# Patient Record
Sex: Female | Born: 1935
Health system: Southern US, Community
[De-identification: ages and names within clinical notes are randomized; demographics above are authoritative.]

## PROBLEM LIST (undated history)

## (undated) DIAGNOSIS — E079 Disorder of thyroid, unspecified: Secondary | ICD-10-CM

## (undated) DIAGNOSIS — I10 Essential (primary) hypertension: Secondary | ICD-10-CM

## (undated) DIAGNOSIS — Z8669 Personal history of other diseases of the nervous system and sense organs: Secondary | ICD-10-CM

## (undated) DIAGNOSIS — M353 Polymyalgia rheumatica: Secondary | ICD-10-CM

## (undated) DIAGNOSIS — I6529 Occlusion and stenosis of unspecified carotid artery: Secondary | ICD-10-CM

## (undated) DIAGNOSIS — J449 Chronic obstructive pulmonary disease, unspecified: Secondary | ICD-10-CM

## (undated) DIAGNOSIS — F0280 Dementia in other diseases classified elsewhere without behavioral disturbance: Secondary | ICD-10-CM

## (undated) DIAGNOSIS — I5189 Other ill-defined heart diseases: Secondary | ICD-10-CM

## (undated) DIAGNOSIS — G459 Transient cerebral ischemic attack, unspecified: Secondary | ICD-10-CM

## (undated) DIAGNOSIS — N159 Renal tubulo-interstitial disease, unspecified: Secondary | ICD-10-CM

## (undated) DIAGNOSIS — S4291XA Fracture of right shoulder girdle, part unspecified, initial encounter for closed fracture: Secondary | ICD-10-CM

## (undated) DIAGNOSIS — F419 Anxiety disorder, unspecified: Secondary | ICD-10-CM

## (undated) DIAGNOSIS — I2510 Atherosclerotic heart disease of native coronary artery without angina pectoris: Secondary | ICD-10-CM

## (undated) DIAGNOSIS — E785 Hyperlipidemia, unspecified: Secondary | ICD-10-CM

## (undated) DIAGNOSIS — F32A Depression, unspecified: Secondary | ICD-10-CM

## (undated) DIAGNOSIS — F329 Major depressive disorder, single episode, unspecified: Secondary | ICD-10-CM

## (undated) DIAGNOSIS — G309 Alzheimer's disease, unspecified: Secondary | ICD-10-CM

## (undated) DIAGNOSIS — Z8639 Personal history of other endocrine, nutritional and metabolic disease: Secondary | ICD-10-CM

## (undated) HISTORY — PX: CARDIAC CATHETERIZATION: SHX172

## (undated) HISTORY — DX: Anxiety disorder, unspecified: F41.9

## (undated) HISTORY — DX: Major depressive disorder, single episode, unspecified: F32.9

## (undated) HISTORY — DX: Chronic obstructive pulmonary disease, unspecified: J44.9

## (undated) HISTORY — DX: Essential (primary) hypertension: I10

## (undated) HISTORY — DX: Depression, unspecified: F32.A

## (undated) HISTORY — DX: Renal tubulo-interstitial disease, unspecified: N15.9

## (undated) HISTORY — DX: Transient cerebral ischemic attack, unspecified: G45.9

## (undated) HISTORY — DX: Hyperlipidemia, unspecified: E78.5

## (undated) HISTORY — DX: Occlusion and stenosis of unspecified carotid artery: I65.29

## (undated) HISTORY — PX: ILIAC ARTERY STENT: SHX1786

## (undated) HISTORY — DX: Polymyalgia rheumatica: M35.3

## (undated) HISTORY — PX: LUNG BIOPSY: SHX232

## (undated) HISTORY — DX: Atherosclerotic heart disease of native coronary artery without angina pectoris: I25.10

## (undated) HISTORY — PX: CAROTID ENDARTERECTOMY: SUR193

## (undated) HISTORY — DX: Other ill-defined heart diseases: I51.89

## (undated) HISTORY — DX: Disorder of thyroid, unspecified: E07.9

## (undated) HISTORY — DX: Personal history of other diseases of the nervous system and sense organs: Z86.69

## (undated) HISTORY — DX: Personal history of other endocrine, nutritional and metabolic disease: Z86.39

---

## 2005-06-22 ENCOUNTER — Ambulatory Visit: Payer: Self-pay | Admitting: Family Medicine

## 2006-05-27 ENCOUNTER — Other Ambulatory Visit: Payer: Self-pay

## 2006-05-27 ENCOUNTER — Ambulatory Visit: Payer: Self-pay | Admitting: Ophthalmology

## 2006-06-08 ENCOUNTER — Ambulatory Visit: Payer: Self-pay | Admitting: Ophthalmology

## 2006-08-04 ENCOUNTER — Ambulatory Visit: Payer: Self-pay | Admitting: Family Medicine

## 2007-01-02 ENCOUNTER — Other Ambulatory Visit: Payer: Self-pay

## 2007-01-02 ENCOUNTER — Emergency Department: Payer: Self-pay | Admitting: Emergency Medicine

## 2007-03-04 ENCOUNTER — Inpatient Hospital Stay: Payer: Self-pay | Admitting: Internal Medicine

## 2007-03-04 ENCOUNTER — Other Ambulatory Visit: Payer: Self-pay

## 2007-03-21 ENCOUNTER — Ambulatory Visit: Payer: Self-pay | Admitting: Family Medicine

## 2007-09-20 ENCOUNTER — Ambulatory Visit: Payer: Self-pay | Admitting: Family Medicine

## 2007-11-03 ENCOUNTER — Ambulatory Visit: Payer: Self-pay | Admitting: Family Medicine

## 2008-06-03 ENCOUNTER — Emergency Department: Payer: Self-pay | Admitting: Emergency Medicine

## 2008-12-02 ENCOUNTER — Ambulatory Visit: Payer: Self-pay | Admitting: Family Medicine

## 2008-12-05 ENCOUNTER — Ambulatory Visit: Payer: Self-pay | Admitting: Family Medicine

## 2009-12-09 ENCOUNTER — Ambulatory Visit: Payer: Self-pay | Admitting: Family Medicine

## 2009-12-24 ENCOUNTER — Inpatient Hospital Stay: Payer: Self-pay | Admitting: Internal Medicine

## 2010-01-06 LAB — LIPID PANEL
Cholesterol: 287 mg/dL — AB (ref 0–200)
HDL: 39 mg/dL (ref 35–70)
LDL Cholesterol: 190 mg/dL
Triglycerides: 289 mg/dL — AB (ref 40–160)

## 2010-05-07 ENCOUNTER — Observation Stay: Payer: Self-pay | Admitting: Specialist

## 2010-05-10 ENCOUNTER — Emergency Department: Payer: Self-pay | Admitting: Emergency Medicine

## 2010-05-14 DIAGNOSIS — I2510 Atherosclerotic heart disease of native coronary artery without angina pectoris: Secondary | ICD-10-CM

## 2010-05-14 DIAGNOSIS — G589 Mononeuropathy, unspecified: Secondary | ICD-10-CM

## 2010-05-14 DIAGNOSIS — I5032 Chronic diastolic (congestive) heart failure: Secondary | ICD-10-CM

## 2010-05-14 DIAGNOSIS — I4891 Unspecified atrial fibrillation: Secondary | ICD-10-CM

## 2010-05-18 DIAGNOSIS — E039 Hypothyroidism, unspecified: Secondary | ICD-10-CM

## 2010-05-27 DIAGNOSIS — R079 Chest pain, unspecified: Secondary | ICD-10-CM

## 2010-06-15 DIAGNOSIS — I4891 Unspecified atrial fibrillation: Secondary | ICD-10-CM

## 2010-06-15 DIAGNOSIS — I5032 Chronic diastolic (congestive) heart failure: Secondary | ICD-10-CM

## 2010-06-15 DIAGNOSIS — I2510 Atherosclerotic heart disease of native coronary artery without angina pectoris: Secondary | ICD-10-CM

## 2010-07-18 DIAGNOSIS — H4010X0 Unspecified open-angle glaucoma, stage unspecified: Secondary | ICD-10-CM | POA: Insufficient documentation

## 2010-07-18 DIAGNOSIS — D891 Cryoglobulinemia: Secondary | ICD-10-CM | POA: Insufficient documentation

## 2010-07-18 DIAGNOSIS — N281 Cyst of kidney, acquired: Secondary | ICD-10-CM | POA: Insufficient documentation

## 2010-07-18 DIAGNOSIS — N3941 Urge incontinence: Secondary | ICD-10-CM | POA: Insufficient documentation

## 2010-07-18 DIAGNOSIS — I2589 Other forms of chronic ischemic heart disease: Secondary | ICD-10-CM | POA: Insufficient documentation

## 2010-07-18 DIAGNOSIS — G250 Essential tremor: Secondary | ICD-10-CM | POA: Insufficient documentation

## 2010-07-18 DIAGNOSIS — M810 Age-related osteoporosis without current pathological fracture: Secondary | ICD-10-CM | POA: Insufficient documentation

## 2010-09-16 ENCOUNTER — Ambulatory Visit: Payer: Self-pay | Admitting: Internal Medicine

## 2010-10-22 ENCOUNTER — Encounter: Payer: Self-pay | Admitting: Family Medicine

## 2010-10-22 ENCOUNTER — Ambulatory Visit (INDEPENDENT_AMBULATORY_CARE_PROVIDER_SITE_OTHER): Payer: Medicare Other | Admitting: Family Medicine

## 2010-10-22 VITALS — BP 148/56 | HR 80 | Temp 98.5°F | Resp 20 | Ht 63.25 in | Wt 197.5 lb

## 2010-10-22 DIAGNOSIS — B370 Candidal stomatitis: Secondary | ICD-10-CM

## 2010-10-22 DIAGNOSIS — J40 Bronchitis, not specified as acute or chronic: Secondary | ICD-10-CM

## 2010-10-22 MED ORDER — FLUCONAZOLE 150 MG PO TABS: 150.0000 mg | ORAL_TABLET | Freq: Once | ORAL | Status: DC

## 2010-10-22 MED ORDER — NYSTATIN 100000 UNIT/ML MT SUSP: 500000.0000 [IU] | Freq: Four times a day (QID) | OROMUCOSAL | Status: AC

## 2010-10-22 MED ORDER — AZITHROMYCIN 250 MG PO TABS: ORAL_TABLET | ORAL | Status: DC

## 2010-10-22 NOTE — Progress Notes (Signed)
77 yo new to our practice here for acute visit only to discuss sinus issues that could not wait until new pt appointment available.  URI symptoms- started almost 2 weeks ago with runny nose, cough, sneezing. Now have chills and subjective fever. Former smoker, h/o COPD. Feels like it is in her chest now--cough is productive.  Tongue is sore. Hurts to swallow.    ROS: See HPI No CP No SOB   Physical exam: BP 148/56  Pulse 80  Temp(Src) 98.5 F (36.9 C) (Oral)  Resp 20  Ht 5' 3.25" (1.607 m)  Wt 197 lb 8 oz (89.585 kg)  BMI 34.71 kg/m2  SpO2 93%  General:  Well-developed,well-nourished,in no acute distress; alert,appropriate and cooperative throughout examination Head:  normocephalic and atraumatic.   Eyes:  vision grossly intact, pupils equal, pupils round, and pupils reactive to light.   Ears:  R ear normal and L ear normal.   Nose:  no external deformity pos erythema  Mouth:  good dentition,  pos PND, pos white plaques on tongue and buccal mucosa Lungs:  Normal respiratory effort, chest expands symmetrically. Scattered exp wheeze, left >right Heart:  Normal rate and regular rhythm. S1 and S2 normal without gallop, murmur, click, rub or other extra sounds. Skin:  Intact without suspicious lesions or rashes Psych:  Cognition and judgment appear intact. Alert and cooperative with normal attention span and concentration. No apparent delusions, illusions, hallucinations  Assessment and Plan:  1. Bronchitis   New. Given duration and progression of symptoms, will treat for bacterial process. The patient indicates understanding of these issues and agrees with the plan.   2. Thrush    New.  Will treat with Nystatin susp.

## 2010-10-22 NOTE — Patient Instructions (Signed)
Good to see you. Please make an appointment for new patient-establish care visit. Keep me posted with your symptoms.

## 2010-11-02 ENCOUNTER — Ambulatory Visit (INDEPENDENT_AMBULATORY_CARE_PROVIDER_SITE_OTHER): Payer: Medicare Other | Admitting: Family Medicine

## 2010-11-02 ENCOUNTER — Encounter: Payer: Self-pay | Admitting: Family Medicine

## 2010-11-02 VITALS — BP 130/60 | HR 76 | Temp 98.4°F | Ht 63.5 in | Wt 195.0 lb

## 2010-11-02 DIAGNOSIS — E8841 MELAS syndrome: Secondary | ICD-10-CM

## 2010-11-02 DIAGNOSIS — J449 Chronic obstructive pulmonary disease, unspecified: Secondary | ICD-10-CM

## 2010-11-02 DIAGNOSIS — I10 Essential (primary) hypertension: Secondary | ICD-10-CM | POA: Insufficient documentation

## 2010-11-02 DIAGNOSIS — F3289 Other specified depressive episodes: Secondary | ICD-10-CM

## 2010-11-02 DIAGNOSIS — J4489 Other specified chronic obstructive pulmonary disease: Secondary | ICD-10-CM

## 2010-11-02 DIAGNOSIS — F329 Major depressive disorder, single episode, unspecified: Secondary | ICD-10-CM

## 2010-11-02 DIAGNOSIS — E079 Disorder of thyroid, unspecified: Secondary | ICD-10-CM

## 2010-11-02 DIAGNOSIS — E8840 Mitochondrial metabolism disorder, unspecified: Secondary | ICD-10-CM

## 2010-11-02 NOTE — Progress Notes (Signed)
77 yo here to establish care.  URI symptoms resolving.  CAD- s/p LAD stent placement in 10/04. On ASA and Plavix. Symptoms have been stable. Was admitted to [facility] in May, cardiac w/u was neg.  MELAS- diagnosed in February 03, 2010 after her son died of MELAS. Unclear which if any of her current issues are attributable to MELAS. She does have significant peripheral neuropathy, but symptoms have been stable on Gabapentin 800 mg three times daily.  Anxiety- has been a little worse lately since her husband has been having worsening health issues, possibly NPH. Feels like Zoloft has been helping. No SI or HI.  Patient Active Problem List  Diagnoses  . Bronchitis  . Thrush  . MELAS (mitochondrial encephalopathy, lactic acidosis and stroke-like episodes)   Past Medical History  Diagnosis Date  . HTN (hypertension)   . COPD (chronic obstructive pulmonary disease)   . Depression   . Thyroid disease   . HLD (hyperlipidemia)   . CAD (coronary artery disease)     h/o NSTEMI, LAD stent placement 10/04, cath 05/11/2010  . PMR (polymyalgia rheumatica)     with h/o steroid induced myopathy  . Anxiety   . Diastolic dysfunction     Echo 05/11/2010, EF 65-70%  . TIA (transient ischemic attack)     11/05 s/p L CEA   Past Surgical History  Procedure Date  . Cholecystectomy   . Carotid endarterectomy     left with stent placement 11/2003  . Iliac artery stent     right, 2001   History  Substance Use Topics  . Smoking status: Former Smoker    Quit date: 01/05/1996  . Smokeless tobacco: Not on file  . Alcohol Use: Not on file   No family history on file. Allergies  Allergen Reactions  . Codeine Other (See Comments)    Coma for 2 days  . Prednisone Other (See Comments)    Weight gain,puffy face   Current Outpatient Prescriptions on File Prior to Visit  Medication Sig Dispense Refill  . aspirin EC 81 MG tablet Take 81 mg by mouth daily.        Marland Kitchen atorvastatin (LIPITOR) 40 MG tablet  Take 40 mg by mouth daily.        . clopidogrel (PLAVIX) 75 MG tablet Take 75 mg by mouth daily.        Marland Kitchen gabapentin (NEURONTIN) 800 MG tablet 1 capsule by mouth in AM and 2 capsules by mouth in PM.       . Levothyroxine Sodium 25 MCG CAPS Take 1 capsule by mouth daily.        Marland Kitchen lidocaine (LINDAMANTLE) 3 % CREA cream Apply 1 application topically as needed.        Marland Kitchen lisinopril (PRINIVIL,ZESTRIL) 10 MG tablet Take 20 mg by mouth daily.        . metoprolol tartrate (LOPRESSOR) 25 MG tablet Take 12.5 mg by mouth daily.        . nitroGLYCERIN (NITROSTAT) 0.4 MG SL tablet Place 0.4 mg under the tongue every 5 (five) minutes as needed.        . ranitidine (ZANTAC) 150 MG tablet Take 150 mg by mouth 2 (two) times daily.        . sertraline (ZOLOFT) 50 MG tablet Take 50 mg by mouth daily.        Marland Kitchen tiotropium (SPIRIVA HANDIHALER) 18 MCG inhalation capsule Place 18 mcg into inhaler and inhale daily.        Marland Kitchen  nystatin (MYCOSTATIN) 100000 UNIT/ML suspension Take 5 mLs (500,000 Units total) by mouth 4 (four) times daily.  60 mL  0   The PMH, PSH, Social History, Family History, Medications, and allergies have been reviewed in [facility], and have been updated if relevant.     ROS: See HPI  Patient reports no  vision/ hearing changes,anorexia, weight change, fever ,adenopathy, persistant / recurrent hoarseness, swallowing issues, chest pain, edema,persistant / recurrent cough, hemoptysis, dyspnea(rest, exertional, paroxysmal nocturnal), gastrointestinal  bleeding (melena, rectal bleeding), abdominal pain, excessive heart burn, GU symptoms(dysuria, hematuria, pyuria, voiding/incontinence  Issues) syncope, focal weakness, severe memory loss, concerning skin lesions, depression,  abnormal bruising/bleeding, major joint swelling, breast masses or abnormal vaginal bleeding.     Physical exam: BP 130/60  Pulse 76  Temp(Src) 98.4 F (36.9 C) (Oral)  Ht 5' 3.5" (1.613 m)  Wt 195 lb (88.451 kg)  BMI 34.00  kg/m2  General:  Well-developed,well-nourished,in no acute distress; alert,appropriate and cooperative throughout examination Head:  normocephalic and atraumatic.   Eyes:  vision grossly intact, pupils equal, pupils round, and pupils reactive to light.   Ears:  R ear normal and L ear normal.   Nose:  no external deformity Mouth:  good dentition,  Lungs:  Normal respiratory effort, chest expands symmetrically. Heart:  Normal rate and regular rhythm. S1 and S2 normal without gallop, murmur, click, rub or other extra sounds. Skin:  Intact without suspicious lesions or rashes Psych:  Cognition and judgment appear intact. Alert and cooperative with normal attention span and concentration. No apparent delusions, illusions, hallucinations  Assessment and Plan:  1. MELAS (mitochondrial encephalopathy, lactic acidosis and stroke-like episodes)  Awaiting records.  Recent diagnosis.  2. HTN (hypertension)  Stable.  3. COPD (chronic obstructive pulmonary disease)  Stable.  4. Depression  Appropriately deteriorated. Pt feels she has good support at home, does not want to adjust medications.  5. Thyroid disease  Stable, awaiting records. Continue Synthroid 25 mcg daily.

## 2010-11-06 ENCOUNTER — Other Ambulatory Visit: Payer: Self-pay | Admitting: *Deleted

## 2010-11-06 MED ORDER — RANITIDINE HCL 150 MG PO TABS: 150.0000 mg | ORAL_TABLET | Freq: Two times a day (BID) | ORAL | Status: DC

## 2010-11-06 NOTE — Telephone Encounter (Signed)
Pt needs rx for mail order, was previously prescribed by old Dr.

## 2010-11-09 NOTE — Telephone Encounter (Signed)
Patient advised as instructed via telephone. 

## 2010-11-20 ENCOUNTER — Encounter: Payer: Self-pay | Admitting: Family Medicine

## 2010-11-24 ENCOUNTER — Other Ambulatory Visit: Payer: Self-pay | Admitting: Family Medicine

## 2010-11-24 ENCOUNTER — Ambulatory Visit (INDEPENDENT_AMBULATORY_CARE_PROVIDER_SITE_OTHER): Payer: Medicare Other | Admitting: Family Medicine

## 2010-11-24 ENCOUNTER — Encounter: Payer: Self-pay | Admitting: Family Medicine

## 2010-11-24 VITALS — BP 140/70 | HR 71 | Temp 98.0°F | Ht 63.5 in | Wt 198.5 lb

## 2010-11-24 DIAGNOSIS — R05 Cough: Secondary | ICD-10-CM

## 2010-11-24 DIAGNOSIS — R059 Cough, unspecified: Secondary | ICD-10-CM

## 2010-11-24 MED ORDER — BENZONATATE 100 MG PO CAPS
100.0000 mg | ORAL_CAPSULE | Freq: Four times a day (QID) | ORAL | Status: DC | PRN
Start: 2010-11-24 — End: 2010-11-24

## 2010-11-24 NOTE — Progress Notes (Signed)
77 yo here for follow up bronchitis.  Saw her last month for 2 weeks of URI symptoms- with chills and fever. Former smoker, h/o COPD.  Treated with Zpack and overall symptoms have improved but still has a nagging dry cough. No SOB or CP.    Patient Active Problem List  Diagnoses  . Bronchitis  . Thrush  . MELAS (mitochondrial encephalopathy, lactic acidosis and stroke-like episodes)  . HTN (hypertension)  . COPD (chronic obstructive pulmonary disease)  . Depression  . Thyroid disease  . Cough   Past Medical History  Diagnosis Date  . HTN (hypertension)   . COPD (chronic obstructive pulmonary disease)   . Depression   . Thyroid disease   . HLD (hyperlipidemia)   . CAD (coronary artery disease)     h/o NSTEMI, LAD stent placement 10/04, cath 05/11/2010  . PMR (polymyalgia rheumatica)     with h/o steroid induced myopathy  . Anxiety   . Diastolic dysfunction     Echo 05/11/2010, EF 65-70%  . TIA (transient ischemic attack)     11/05 s/p L CEA   Past Surgical History  Procedure Date  . Cholecystectomy   . Carotid endarterectomy     left with stent placement 11/2003  . Iliac artery stent     right, 2001   History  Substance Use Topics  . Smoking status: Former Smoker    Quit date: 01/05/1996  . Smokeless tobacco: Not on file  . Alcohol Use: Not on file   No family history on file. Allergies  Allergen Reactions  . Codeine Other (See Comments)    Coma for 2 days  . Prednisone Other (See Comments)    Weight gain,puffy face   Current Outpatient Prescriptions on File Prior to Visit  Medication Sig Dispense Refill  . aspirin EC 81 MG tablet Take 81 mg by mouth daily.        Marland Kitchen atorvastatin (LIPITOR) 40 MG tablet Take 40 mg by mouth daily.        . clopidogrel (PLAVIX) 75 MG tablet Take 75 mg by mouth daily.        Marland Kitchen gabapentin (NEURONTIN) 800 MG tablet 1 capsule by mouth in AM and 2 capsules by mouth in PM.       . Levothyroxine Sodium 25 MCG CAPS Take 1 capsule by  mouth daily.        Marland Kitchen lidocaine (LINDAMANTLE) 3 % CREA cream Apply 1 application topically as needed.        Marland Kitchen lisinopril (PRINIVIL,ZESTRIL) 10 MG tablet Take 20 mg by mouth daily.        . metoprolol tartrate (LOPRESSOR) 25 MG tablet Take 12.5 mg by mouth daily.        . nitroGLYCERIN (NITROSTAT) 0.4 MG SL tablet Place 0.4 mg under the tongue every 5 (five) minutes as needed.        . ranitidine (ZANTAC) 150 MG tablet Take 1 tablet (150 mg total) by mouth 2 (two) times daily.  180 tablet  3  . sertraline (ZOLOFT) 50 MG tablet Take 50 mg by mouth daily.        Marland Kitchen tiotropium (SPIRIVA HANDIHALER) 18 MCG inhalation capsule Place 18 mcg into inhaler and inhale daily.         The PMH, PSH, Social History, Family History, Medications, and allergies have been reviewed in [facility], and have been updated if relevant.     ROS: See HPI   Physical exam: BP 140/70  Pulse  71  Temp(Src) 98 F (36.7 C) (Oral)  Ht 5' 3.5" (1.613 m)  Wt 198 lb 8 oz (90.039 kg)  BMI 34.61 kg/m2  General:  Well-developed,well-nourished,in no acute distress; alert,appropriate and cooperative throughout examination Head:  normocephalic and atraumatic.   Eyes:  vision grossly intact, pupils equal, pupils round, and pupils reactive to light.   Ears:  R ear normal and L ear normal.   Nose:  no external deformity Mouth:  good dentition,  pos PND Lungs:  Normal respiratory effort, chest expands symmetrically. No wheezes or crackles. Heart:  Normal rate and regular rhythm. S1 and S2 normal without gallop, murmur, click, rub or other extra sounds. Skin:  Intact without suspicious lesions or rashes Psych:  Cognition and judgment appear intact. Alert and cooperative with normal attention span and concentration. No apparent delusions, illusions, hallucinations  Assessment and Plan:  1. Cough   Unchanged but other symptoms have improved. Lungs clear on exam.   Will treat with Tessalon Perles for symptoms. Stay  hydrated.

## 2010-11-30 ENCOUNTER — Telehealth: Payer: Self-pay | Admitting: *Deleted

## 2010-11-30 MED ORDER — SERTRALINE HCL 50 MG PO TABS: 50.0000 mg | ORAL_TABLET | Freq: Every day | ORAL | Status: DC

## 2010-11-30 MED ORDER — RANITIDINE HCL 300 MG PO TABS: 300.0000 mg | ORAL_TABLET | Freq: Every day | ORAL | Status: DC

## 2010-11-30 NOTE — Telephone Encounter (Signed)
Spoke with [name], patients daughter and she requested that both Rx's be sent to [facility].  Will send in both Rx's and call her if there are any questions.  Zantac changed in Epic from 150mg  twice daily to 300mg  once daily.

## 2010-11-30 NOTE — Telephone Encounter (Signed)
Pt's daughter is asking if she can have her zantac script changed from 150 mg's twice a day to one 300 mg daily because the 150 mg is otc, and her insurance wont cover it.   She wants a 90 day script sent to [facility].  Also, needs refill on sertraline.

## 2010-11-30 NOTE — Telephone Encounter (Signed)
Yes ok for both but does she want it sent to [facility] or [facility] which is in the computer?

## 2010-12-21 ENCOUNTER — Other Ambulatory Visit: Payer: Self-pay | Admitting: Family Medicine

## 2010-12-21 MED ORDER — CLOPIDOGREL BISULFATE 75 MG PO TABS: 75.0000 mg | ORAL_TABLET | Freq: Every day | ORAL | Status: DC

## 2011-01-14 ENCOUNTER — Other Ambulatory Visit: Payer: Self-pay | Admitting: Family Medicine

## 2011-01-14 ENCOUNTER — Encounter: Payer: Self-pay | Admitting: Family Medicine

## 2011-01-14 ENCOUNTER — Ambulatory Visit (INDEPENDENT_AMBULATORY_CARE_PROVIDER_SITE_OTHER): Payer: Medicare Other | Admitting: Family Medicine

## 2011-01-14 VITALS — BP 130/60 | HR 69 | Temp 98.1°F | Wt 198.5 lb

## 2011-01-14 DIAGNOSIS — Z23 Encounter for immunization: Secondary | ICD-10-CM | POA: Diagnosis not present

## 2011-01-14 DIAGNOSIS — F329 Major depressive disorder, single episode, unspecified: Secondary | ICD-10-CM

## 2011-01-14 DIAGNOSIS — G589 Mononeuropathy, unspecified: Secondary | ICD-10-CM | POA: Diagnosis not present

## 2011-01-14 DIAGNOSIS — G629 Polyneuropathy, unspecified: Secondary | ICD-10-CM | POA: Insufficient documentation

## 2011-01-14 DIAGNOSIS — R5381 Other malaise: Secondary | ICD-10-CM | POA: Diagnosis not present

## 2011-01-14 DIAGNOSIS — G4700 Insomnia, unspecified: Secondary | ICD-10-CM

## 2011-01-14 DIAGNOSIS — R0602 Shortness of breath: Secondary | ICD-10-CM | POA: Insufficient documentation

## 2011-01-14 DIAGNOSIS — R5383 Other fatigue: Secondary | ICD-10-CM | POA: Insufficient documentation

## 2011-01-14 LAB — BASIC METABOLIC PANEL
BUN: 19 mg/dL (ref 6–23)
Chloride: 106 mEq/L (ref 96–112)
Potassium: 5.1 mEq/L (ref 3.5–5.1)
Sodium: 143 mEq/L (ref 135–145)

## 2011-01-14 LAB — CBC WITH DIFFERENTIAL/PLATELET
Basophils Absolute: 0 10*3/uL (ref 0.0–0.1)
Eosinophils Relative: 3.4 % (ref 0.0–5.0)
Lymphocytes Relative: 13.4 % (ref 12.0–46.0)
Monocytes Relative: 9.5 % (ref 3.0–12.0)
Neutrophils Relative %: 73.4 % (ref 43.0–77.0)
Platelets: 222 10*3/uL (ref 150.0–400.0)
WBC: 8.8 10*3/uL (ref 4.5–10.5)

## 2011-01-14 LAB — MAGNESIUM: Magnesium: 2.2 mg/dL (ref 1.5–2.5)

## 2011-01-14 LAB — TSH: TSH: 2.92 u[IU]/mL (ref 0.35–5.50)

## 2011-01-14 MED ORDER — TRAZODONE HCL 50 MG PO TABS: 25.0000 mg | ORAL_TABLET | Freq: Every evening | ORAL | Status: DC | PRN

## 2011-01-14 NOTE — Patient Instructions (Signed)
Good to see you. We are checking blood work today. Let's start taking Trazadone at night.

## 2011-01-14 NOTE — Progress Notes (Signed)
78 yo here for SOB and fatigue.  Does have h/o COPD. Here with her daughter and husband who report that she often forgets to take her spiriva. Symptoms are better when she does take it. Does have h/o CAD- s/p LAD stent placement in 10/04. On ASA and Plavix.  Denies any CP. She has noted some dizziness when she stands from a seated position.  SOB is worsened with exertion.  MELAS- diagnosed in 2010/02/04 after her son died of MELAS. Unclear which if any of her current issues are attributable to MELAS. She does have significant peripheral neuropathy, but symptoms have been stable on Gabapentin 800 mg three times daily.  Insomnia- having difficulty falling asleep at night.  Daughter is concerned that it's partly related to increased anxiety from her husband's recent ailments. Denies any SI or HI. Does not feel tearful.  Patient Active Problem List  Diagnoses  . Bronchitis  . Thrush  . MELAS (mitochondrial encephalopathy, lactic acidosis and stroke-like episodes)  . HTN (hypertension)  . COPD (chronic obstructive pulmonary disease)  . Depression  . Thyroid disease  . Cough  . Shortness of breath  . Fatigue  . Neuropathy   Past Medical History  Diagnosis Date  . HTN (hypertension)   . COPD (chronic obstructive pulmonary disease)   . Depression   . Thyroid disease   . HLD (hyperlipidemia)   . CAD (coronary artery disease)     h/o NSTEMI, LAD stent placement 10/04, cath 05/11/2010  . PMR (polymyalgia rheumatica)     with h/o steroid induced myopathy  . Anxiety   . Diastolic dysfunction     Echo 05/11/2010, EF 65-70%  . TIA (transient ischemic attack)     11/05 s/p L CEA   Past Surgical History  Procedure Date  . Cholecystectomy   . Carotid endarterectomy     left with stent placement 11/2003  . Iliac artery stent     right, 2001   History  Substance Use Topics  . Smoking status: Former Smoker    Quit date: 01/05/1996  . Smokeless tobacco: Not on file  . Alcohol Use:  Not on file   No family history on file. Allergies  Allergen Reactions  . Codeine Other (See Comments)    Coma for 2 days  . Prednisone Other (See Comments)    Weight gain,puffy face   Current Outpatient Prescriptions on File Prior to Visit  Medication Sig Dispense Refill  . aspirin EC 81 MG tablet Take 81 mg by mouth daily.        Marland Kitchen atorvastatin (LIPITOR) 40 MG tablet Take 40 mg by mouth daily.        . clopidogrel (PLAVIX) 75 MG tablet Take 1 tablet (75 mg total) by mouth daily.  90 tablet  3  . gabapentin (NEURONTIN) 800 MG tablet 1 capsule by mouth in AM and 2 capsules by mouth in PM.       . Levothyroxine Sodium 25 MCG CAPS Take 1 capsule by mouth daily.        Marland Kitchen lidocaine (LINDAMANTLE) 3 % CREA cream Apply 1 application topically as needed.        Marland Kitchen lisinopril (PRINIVIL,ZESTRIL) 10 MG tablet Take 20 mg by mouth daily.        . metoprolol tartrate (LOPRESSOR) 25 MG tablet Take 12.5 mg by mouth daily.        . nitroGLYCERIN (NITROSTAT) 0.4 MG SL tablet Place 0.4 mg under the tongue every 5 (five) minutes as  needed.        . ranitidine (ZANTAC) 300 MG tablet Take 1 tablet (300 mg total) by mouth at bedtime.  90 tablet  3  . sertraline (ZOLOFT) 50 MG tablet Take 1 tablet (50 mg total) by mouth daily.  90 tablet  3  . tiotropium (SPIRIVA HANDIHALER) 18 MCG inhalation capsule Place 18 mcg into inhaler and inhale daily.         The PMH, PSH, Social History, Family History, Medications, and allergies have been reviewed in [facility], and have been updated if relevant.     ROS: See HPI  Patient reports no  vision/ hearing changes,anorexia, weight change, fever ,adenopathy, persistant / recurrent hoarseness, swallowing issues, chest pain, edema,persistant / recurrent cough, hemoptysis, dyspnea(rest, exertional, paroxysmal nocturnal), gastrointestinal  bleeding (melena, rectal bleeding), abdominal pain, excessive heart burn, GU symptoms(dysuria, hematuria, pyuria, voiding/incontinence  Issues)  syncope, focal weakness, severe memory loss, concerning skin lesions, depression,  abnormal bruising/bleeding, major joint swelling, breast masses or abnormal vaginal bleeding.     Physical exam: BP 130/60  Pulse 69  Temp(Src) 98.1 F (36.7 C) (Oral)  Wt 198 lb 8 oz (90.039 kg)  SpO2 94%  General:  Well-developed,well-nourished,in no acute distress; alert,appropriate and cooperative throughout examination Head:  normocephalic and atraumatic.   Eyes:  vision grossly intact, pupils equal, pupils round, and pupils reactive to light.   Ears:  R ear normal and L ear normal.   Nose:  no external deformity Mouth:  good dentition,  Lungs:  Normal respiratory effort, chest expands symmetrically. Heart:  Normal rate and regular rhythm. S1 and S2 normal without gallop, murmur, click, rub or other extra sounds. Skin:  Intact without suspicious lesions or rashes Psych:  Cognition and judgment appear intact. Alert and cooperative with normal attention span and concentration. No apparent delusions, illusions, hallucinations  Assessment and Plan:  1. Shortness of breath  New- likely multifactorial.  Stressed importance of taking her Spiriva.  Lungs clear today- ? Anemia.  Will check labs   2. Fatigue  Likely related to #1.  See above. TSH, CBC w/Diff, Basic Metabolic Panel (BMET), Magnesium  3. Insomnia  Deteriorated.  Will add Trazadone 50 mg qhs. The patient indicates understanding of these issues and agrees with the plan.

## 2011-01-18 ENCOUNTER — Ambulatory Visit (INDEPENDENT_AMBULATORY_CARE_PROVIDER_SITE_OTHER)
Admission: RE | Admit: 2011-01-18 | Discharge: 2011-01-18 | Disposition: A | Discharge status: Home / Self Care | Payer: Medicare Other | Source: Ambulatory Visit | Attending: Family Medicine | Admitting: Family Medicine

## 2011-01-18 ENCOUNTER — Other Ambulatory Visit: Payer: Self-pay | Admitting: Family Medicine

## 2011-01-18 DIAGNOSIS — R079 Chest pain, unspecified: Secondary | ICD-10-CM | POA: Diagnosis not present

## 2011-01-18 DIAGNOSIS — R0602 Shortness of breath: Secondary | ICD-10-CM

## 2011-01-18 MED ORDER — ATORVASTATIN CALCIUM 40 MG PO TABS: 40.0000 mg | ORAL_TABLET | Freq: Every day | ORAL | Status: DC

## 2011-01-18 MED ORDER — METOPROLOL TARTRATE 25 MG PO TABS: 12.5000 mg | ORAL_TABLET | Freq: Every day | ORAL | Status: DC

## 2011-01-18 MED ORDER — GABAPENTIN 800 MG PO TABS: ORAL_TABLET | ORAL | Status: DC

## 2011-01-19 ENCOUNTER — Other Ambulatory Visit: Payer: Self-pay | Admitting: Family Medicine

## 2011-01-19 DIAGNOSIS — J449 Chronic obstructive pulmonary disease, unspecified: Secondary | ICD-10-CM

## 2011-02-02 ENCOUNTER — Ambulatory Visit (INDEPENDENT_AMBULATORY_CARE_PROVIDER_SITE_OTHER): Payer: Medicare Other | Admitting: Family Medicine

## 2011-02-02 ENCOUNTER — Encounter: Payer: Self-pay | Admitting: Family Medicine

## 2011-02-02 VITALS — BP 100/60 | HR 72 | Temp 98.3°F | Wt 199.5 lb

## 2011-02-02 DIAGNOSIS — N644 Mastodynia: Secondary | ICD-10-CM | POA: Diagnosis not present

## 2011-02-02 DIAGNOSIS — R059 Cough, unspecified: Secondary | ICD-10-CM

## 2011-02-02 DIAGNOSIS — R05 Cough: Secondary | ICD-10-CM | POA: Diagnosis not present

## 2011-02-02 MED ORDER — TIOTROPIUM BROMIDE MONOHYDRATE 18 MCG IN CAPS: 18.0000 ug | ORAL_CAPSULE | Freq: Every day | RESPIRATORY_TRACT | Status: DC

## 2011-02-02 MED ORDER — MOXIFLOXACIN HCL 400 MG PO TABS: 400.0000 mg | ORAL_TABLET | Freq: Every day | ORAL | Status: AC

## 2011-02-02 NOTE — Progress Notes (Signed)
78 yo here with cough.  Former smoker, h/o COPD.  Recently treated with Zpack, cough has persisted. Husband has PNA and now her cough is more productive.  No SOB or CP.  Ran out of her spiriva.  Right breast pain- ongoing for a year.  Has not felt any lumps but breast is very tender to touch. No changes in nipple or nipple discharge.  Rollene Fare, Ms. Gunning's daughter, is concerned that both she and her husband [name] are needing more help at home. They have an aide (non RN) who comes to the home a couple of times a week. 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  Ms. Resko is not comfortable with moving to ALF yet.  Patient Active Problem List  Diagnoses  . Bronchitis  . Thrush  . MELAS (mitochondrial encephalopathy, lactic acidosis and stroke-like episodes)  . HTN (hypertension)  . COPD (chronic obstructive pulmonary disease)  . Depression  . Thyroid disease  . Cough  . Shortness of breath  . Fatigue  . Neuropathy  . Insomnia  . Breast pain   Past Medical History  Diagnosis Date  . HTN (hypertension)   . COPD (chronic obstructive pulmonary disease)   . Depression   . Thyroid disease   . HLD (hyperlipidemia)   . CAD (coronary artery disease)     h/o NSTEMI, LAD stent placement 10/04, cath 05/11/2010  . PMR (polymyalgia rheumatica)     with h/o steroid induced myopathy  . Anxiety   . Diastolic dysfunction     Echo 05/11/2010, EF 65-70%  . TIA (transient ischemic attack)     11/05 s/p L CEA   Past Surgical History  Procedure Date  . Cholecystectomy   . Carotid endarterectomy     left with stent placement 11/2003  . Iliac artery stent     right, 2001   History  Substance Use Topics  . Smoking status: Former Smoker    Quit date: 01/05/1996  . Smokeless tobacco: Never Used  . Alcohol Use: No   No family history on file. Allergies  Allergen Reactions  . Codeine Other (See Comments)    Coma for 2 days  .  Prednisone Other (See Comments)    Weight gain,puffy face   Current Outpatient Prescriptions on File Prior to Visit  Medication Sig Dispense Refill  . aspirin EC 81 MG tablet Take 81 mg by mouth daily.        . clopidogrel (PLAVIX) 75 MG tablet Take 1 tablet (75 mg total) by mouth daily.  90 tablet  3  . gabapentin (NEURONTIN) 800 MG tablet 1 capsule by mouth in AM and 2 capsules by mouth in PM. 1 capsule by mouth in AM and 2 capsules by mouth in PM.  180 tablet  3  . Levothyroxine Sodium 25 MCG CAPS Take 1 capsule by mouth daily.        Marland Kitchen lidocaine (LINDAMANTLE) 3 % CREA cream Apply 1 application topically as needed.        Marland Kitchen lisinopril (PRINIVIL,ZESTRIL) 10 MG tablet Take 20 mg by mouth daily.        . metoprolol tartrate (LOPRESSOR) 25 MG tablet Take 0.5 tablets (12.5 mg total) by mouth daily.  90 tablet  3  . nitroGLYCERIN (NITROSTAT) 0.4 MG SL tablet Place 0.4 mg under the tongue every 5 (five) minutes as needed.        . ranitidine (ZANTAC) 300 MG tablet Take  1 tablet (300 mg total) by mouth at bedtime.  90 tablet  3  . sertraline (ZOLOFT) 50 MG tablet Take 1 tablet (50 mg total) by mouth daily.  90 tablet  3  . traZODone (DESYREL) 50 MG tablet Take 0.5-1 tablets (25-50 mg total) by mouth at bedtime as needed for sleep.  30 tablet  3  . atorvastatin (LIPITOR) 40 MG tablet Take 1 tablet (40 mg total) by mouth daily.  90 tablet  3   The PMH, PSH, Social History, Family History, Medications, and allergies have been reviewed in [facility], and have been updated if relevant.     ROS: See HPI   Physical exam: BP 100/60  Pulse 72  Temp(Src) 98.3 F (36.8 C) (Oral)  Wt 199 lb 8 oz (90.493 kg)  General:  Well-developed,well-nourished,in no acute distress; alert,appropriate and cooperative throughout examination Head:  normocephalic and atraumatic.   Eyes:  vision grossly intact, pupils equal, pupils round, and pupils reactive to light.   Ears:  R ear normal and L ear normal.   Nose:  no  external deformity Mouth:  good dentition,  pos PND Breast:  No palpable masses or changes in nipples but she is very tender at 3 oclock position on left breast. Lungs:  Normal respiratory effort, chest expands symmetrically. Pos exp wheezes, ronchi on left Heart:  Normal rate and regular rhythm. S1 and S2 normal without gallop, murmur, click, rub or other extra sounds. Skin:  Intact without suspicious lesions or rashes Psych:  Cognition and judgment appear intact. Alert and cooperative with normal attention span and concentration. No apparent delusions, illusions, hallucinations  Assessment and Plan:  1. Breast pain in female  No palpable mass. Will refer for mammogram for further evaluation. MM Digital Diagnostic Bilat  2. Cough  Deteriorated with worsening lung sounds and husband with probable PNA. Will treat with Avelox, advised to restart spiriva. The patient indicates understanding of these issues and agrees with the plan.    3.  Increased home care needs Pt is resistant to idea of ALF at this time. I will contact to Leticia Penna to see what we can do to help with increased home assistance.

## 2011-02-02 NOTE — Patient Instructions (Signed)
Please take Avelox as directed. Use your spiriva.

## 2011-02-03 ENCOUNTER — Ambulatory Visit: Payer: Self-pay | Admitting: Family Medicine

## 2011-02-03 ENCOUNTER — Encounter: Payer: Self-pay | Admitting: Family Medicine

## 2011-02-03 DIAGNOSIS — R928 Other abnormal and inconclusive findings on diagnostic imaging of breast: Secondary | ICD-10-CM | POA: Diagnosis not present

## 2011-02-03 DIAGNOSIS — N6009 Solitary cyst of unspecified breast: Secondary | ICD-10-CM | POA: Diagnosis not present

## 2011-02-04 ENCOUNTER — Encounter: Payer: Self-pay | Admitting: *Deleted

## 2011-02-05 ENCOUNTER — Inpatient Hospital Stay: Payer: Self-pay | Admitting: Internal Medicine

## 2011-02-05 DIAGNOSIS — F329 Major depressive disorder, single episode, unspecified: Secondary | ICD-10-CM | POA: Diagnosis present

## 2011-02-05 DIAGNOSIS — Z7902 Long term (current) use of antithrombotics/antiplatelets: Secondary | ICD-10-CM | POA: Diagnosis not present

## 2011-02-05 DIAGNOSIS — J9620 Acute and chronic respiratory failure, unspecified whether with hypoxia or hypercapnia: Secondary | ICD-10-CM | POA: Diagnosis not present

## 2011-02-05 DIAGNOSIS — M6281 Muscle weakness (generalized): Secondary | ICD-10-CM | POA: Diagnosis not present

## 2011-02-05 DIAGNOSIS — F0150 Vascular dementia without behavioral disturbance: Secondary | ICD-10-CM | POA: Diagnosis not present

## 2011-02-05 DIAGNOSIS — R0602 Shortness of breath: Secondary | ICD-10-CM | POA: Diagnosis not present

## 2011-02-05 DIAGNOSIS — Z79899 Other long term (current) drug therapy: Secondary | ICD-10-CM | POA: Diagnosis not present

## 2011-02-05 DIAGNOSIS — E8840 Mitochondrial metabolism disorder, unspecified: Secondary | ICD-10-CM | POA: Diagnosis not present

## 2011-02-05 DIAGNOSIS — R4182 Altered mental status, unspecified: Secondary | ICD-10-CM | POA: Diagnosis not present

## 2011-02-05 DIAGNOSIS — R262 Difficulty in walking, not elsewhere classified: Secondary | ICD-10-CM | POA: Diagnosis not present

## 2011-02-05 DIAGNOSIS — Z8673 Personal history of transient ischemic attack (TIA), and cerebral infarction without residual deficits: Secondary | ICD-10-CM | POA: Diagnosis not present

## 2011-02-05 DIAGNOSIS — J189 Pneumonia, unspecified organism: Secondary | ICD-10-CM | POA: Diagnosis not present

## 2011-02-05 DIAGNOSIS — J9600 Acute respiratory failure, unspecified whether with hypoxia or hypercapnia: Secondary | ICD-10-CM | POA: Diagnosis not present

## 2011-02-05 DIAGNOSIS — R0902 Hypoxemia: Secondary | ICD-10-CM | POA: Diagnosis not present

## 2011-02-05 DIAGNOSIS — Z823 Family history of stroke: Secondary | ICD-10-CM | POA: Diagnosis not present

## 2011-02-05 DIAGNOSIS — R6889 Other general symptoms and signs: Secondary | ICD-10-CM | POA: Diagnosis not present

## 2011-02-05 DIAGNOSIS — Z882 Allergy status to sulfonamides status: Secondary | ICD-10-CM | POA: Diagnosis not present

## 2011-02-05 DIAGNOSIS — Z7982 Long term (current) use of aspirin: Secondary | ICD-10-CM | POA: Diagnosis not present

## 2011-02-05 DIAGNOSIS — I509 Heart failure, unspecified: Secondary | ICD-10-CM | POA: Diagnosis not present

## 2011-02-05 DIAGNOSIS — E039 Hypothyroidism, unspecified: Secondary | ICD-10-CM | POA: Diagnosis present

## 2011-02-05 DIAGNOSIS — I672 Cerebral atherosclerosis: Secondary | ICD-10-CM | POA: Diagnosis not present

## 2011-02-05 DIAGNOSIS — J441 Chronic obstructive pulmonary disease with (acute) exacerbation: Secondary | ICD-10-CM | POA: Diagnosis not present

## 2011-02-05 DIAGNOSIS — Z9109 Other allergy status, other than to drugs and biological substances: Secondary | ICD-10-CM | POA: Diagnosis not present

## 2011-02-05 DIAGNOSIS — J4489 Other specified chronic obstructive pulmonary disease: Secondary | ICD-10-CM | POA: Diagnosis not present

## 2011-02-05 DIAGNOSIS — G609 Hereditary and idiopathic neuropathy, unspecified: Secondary | ICD-10-CM | POA: Diagnosis present

## 2011-02-05 DIAGNOSIS — I2510 Atherosclerotic heart disease of native coronary artery without angina pectoris: Secondary | ICD-10-CM | POA: Diagnosis present

## 2011-02-05 DIAGNOSIS — I10 Essential (primary) hypertension: Secondary | ICD-10-CM | POA: Diagnosis present

## 2011-02-05 DIAGNOSIS — J449 Chronic obstructive pulmonary disease, unspecified: Secondary | ICD-10-CM | POA: Diagnosis not present

## 2011-02-05 DIAGNOSIS — Z888 Allergy status to other drugs, medicaments and biological substances status: Secondary | ICD-10-CM | POA: Diagnosis not present

## 2011-02-05 DIAGNOSIS — Z87891 Personal history of nicotine dependence: Secondary | ICD-10-CM | POA: Diagnosis not present

## 2011-02-05 DIAGNOSIS — Z885 Allergy status to narcotic agent status: Secondary | ICD-10-CM | POA: Diagnosis not present

## 2011-02-05 LAB — URINALYSIS, COMPLETE
Bilirubin,UR: NEGATIVE
Blood: NEGATIVE
Ketone: NEGATIVE
RBC,UR: 1 /HPF (ref 0–5)
Specific Gravity: 1.018 (ref 1.003–1.030)
Squamous Epithelial: NONE SEEN
WBC UR: <1 /HPF (ref 0–5)

## 2011-02-05 LAB — CBC
Platelet: 166 10*3/uL (ref 150–440)
RBC: 4.34 10*6/uL (ref 3.80–5.20)
RDW: 14.7 % — ABNORMAL HIGH (ref 11.5–14.5)
WBC: 6.7 10*3/uL (ref 3.6–11.0)

## 2011-02-05 LAB — COMPREHENSIVE METABOLIC PANEL
Alkaline Phosphatase: 101 U/L (ref 50–136)
Anion Gap: 8 (ref 7–16)
Bilirubin,Total: 0.7 mg/dL (ref 0.2–1.0)
Calcium, Total: 8.4 mg/dL — ABNORMAL LOW (ref 8.5–10.1)
Chloride: 105 mmol/L (ref 98–107)
Co2: 31 mmol/L (ref 21–32)
EGFR (African American): >60
EGFR (Non-African Amer.): 57 — ABNORMAL LOW
Glucose: 80 mg/dL (ref 65–99)
SGOT(AST): 18 U/L (ref 15–37)
SGPT (ALT): 10 U/L — ABNORMAL LOW

## 2011-02-05 LAB — PRO B NATRIURETIC PEPTIDE: B-Type Natriuretic Peptide: 1016 pg/mL — ABNORMAL HIGH (ref 0–450)

## 2011-02-07 LAB — URINE CULTURE

## 2011-02-08 ENCOUNTER — Institutional Professional Consult (permissible substitution): Payer: Medicare Other | Admitting: Pulmonary Disease

## 2011-02-09 LAB — PLATELET COUNT: Platelet: 136 10*3/uL — ABNORMAL LOW (ref 150–440)

## 2011-02-10 DIAGNOSIS — J189 Pneumonia, unspecified organism: Secondary | ICD-10-CM | POA: Diagnosis not present

## 2011-02-10 DIAGNOSIS — R6889 Other general symptoms and signs: Secondary | ICD-10-CM | POA: Diagnosis not present

## 2011-02-10 DIAGNOSIS — I10 Essential (primary) hypertension: Secondary | ICD-10-CM | POA: Diagnosis not present

## 2011-02-10 DIAGNOSIS — R262 Difficulty in walking, not elsewhere classified: Secondary | ICD-10-CM | POA: Diagnosis not present

## 2011-02-10 DIAGNOSIS — M6281 Muscle weakness (generalized): Secondary | ICD-10-CM | POA: Diagnosis not present

## 2011-02-10 DIAGNOSIS — J9600 Acute respiratory failure, unspecified whether with hypoxia or hypercapnia: Secondary | ICD-10-CM | POA: Diagnosis not present

## 2011-02-10 DIAGNOSIS — J4489 Other specified chronic obstructive pulmonary disease: Secondary | ICD-10-CM | POA: Diagnosis not present

## 2011-02-10 DIAGNOSIS — R0902 Hypoxemia: Secondary | ICD-10-CM | POA: Diagnosis not present

## 2011-02-10 DIAGNOSIS — J449 Chronic obstructive pulmonary disease, unspecified: Secondary | ICD-10-CM | POA: Diagnosis not present

## 2011-02-10 DIAGNOSIS — F329 Major depressive disorder, single episode, unspecified: Secondary | ICD-10-CM | POA: Diagnosis not present

## 2011-02-10 LAB — BASIC METABOLIC PANEL
Anion Gap: 10 (ref 7–16)
BUN: 13 mg/dL (ref 7–18)
Chloride: 105 mmol/L (ref 98–107)
Creatinine: 0.8 mg/dL (ref 0.60–1.30)
EGFR (Non-African Amer.): >60
Osmolality: 289 (ref 275–301)
Potassium: 4 mmol/L (ref 3.5–5.1)
Sodium: 145 mmol/L (ref 136–145)

## 2011-02-10 LAB — CBC WITH DIFFERENTIAL/PLATELET
Basophil #: 0 10*3/uL (ref 0.0–0.1)
Eosinophil %: 2.6 %
HCT: 33.9 % — ABNORMAL LOW (ref 35.0–47.0)
Lymphocyte %: 12.4 %
MCHC: 35 g/dL (ref 32.0–36.0)
Monocyte %: 11.2 %
Neutrophil #: 4.7 10*3/uL (ref 1.4–6.5)
Neutrophil %: 73.5 %
Platelet: 142 10*3/uL — ABNORMAL LOW (ref 150–440)
WBC: 6.4 10*3/uL (ref 3.6–11.0)

## 2011-02-11 LAB — CULTURE, BLOOD (SINGLE)

## 2011-02-12 DIAGNOSIS — J4489 Other specified chronic obstructive pulmonary disease: Secondary | ICD-10-CM

## 2011-02-12 DIAGNOSIS — J189 Pneumonia, unspecified organism: Secondary | ICD-10-CM

## 2011-02-12 DIAGNOSIS — J449 Chronic obstructive pulmonary disease, unspecified: Secondary | ICD-10-CM

## 2011-02-12 DIAGNOSIS — F3289 Other specified depressive episodes: Secondary | ICD-10-CM

## 2011-02-12 DIAGNOSIS — F329 Major depressive disorder, single episode, unspecified: Secondary | ICD-10-CM

## 2011-02-12 DIAGNOSIS — I10 Essential (primary) hypertension: Secondary | ICD-10-CM

## 2011-02-17 ENCOUNTER — Ambulatory Visit (INDEPENDENT_AMBULATORY_CARE_PROVIDER_SITE_OTHER): Payer: Medicare Other | Admitting: Family Medicine

## 2011-02-17 DIAGNOSIS — J9620 Acute and chronic respiratory failure, unspecified whether with hypoxia or hypercapnia: Secondary | ICD-10-CM | POA: Insufficient documentation

## 2011-02-17 DIAGNOSIS — J449 Chronic obstructive pulmonary disease, unspecified: Secondary | ICD-10-CM

## 2011-02-17 NOTE — Progress Notes (Signed)
  Still in SNF. No charge.

## 2011-03-04 ENCOUNTER — Ambulatory Visit (INDEPENDENT_AMBULATORY_CARE_PROVIDER_SITE_OTHER): Payer: Medicare Other | Admitting: Family Medicine

## 2011-03-04 ENCOUNTER — Telehealth: Payer: Self-pay | Admitting: *Deleted

## 2011-03-04 ENCOUNTER — Other Ambulatory Visit: Payer: Self-pay | Admitting: Family Medicine

## 2011-03-04 ENCOUNTER — Encounter: Payer: Self-pay | Admitting: Family Medicine

## 2011-03-04 VITALS — BP 120/64 | HR 72 | Temp 98.1°F | Wt 194.0 lb

## 2011-03-04 DIAGNOSIS — M545 Low back pain: Secondary | ICD-10-CM

## 2011-03-04 DIAGNOSIS — E8841 MELAS syndrome: Secondary | ICD-10-CM

## 2011-03-04 DIAGNOSIS — R51 Headache: Secondary | ICD-10-CM | POA: Diagnosis not present

## 2011-03-04 DIAGNOSIS — R519 Headache, unspecified: Secondary | ICD-10-CM | POA: Insufficient documentation

## 2011-03-04 DIAGNOSIS — E8840 Mitochondrial metabolism disorder, unspecified: Secondary | ICD-10-CM

## 2011-03-04 DIAGNOSIS — E876 Hypokalemia: Secondary | ICD-10-CM

## 2011-03-04 DIAGNOSIS — J449 Chronic obstructive pulmonary disease, unspecified: Secondary | ICD-10-CM

## 2011-03-04 LAB — POCT URINALYSIS DIPSTICK
Ketones, UA: NEGATIVE
Leukocytes, UA: NEGATIVE
Nitrite, UA: NEGATIVE
Protein, UA: NEGATIVE
pH, UA: 5

## 2011-03-04 LAB — RENAL FUNCTION PANEL
CO2: 31 mEq/L (ref 19–32)
Calcium: 9 mg/dL (ref 8.4–10.5)
Creatinine, Ser: 1.1 mg/dL (ref 0.4–1.2)
GFR: 52.68 mL/min — ABNORMAL LOW (ref >60.00)
Glucose, Bld: 107 mg/dL — ABNORMAL HIGH (ref 70–99)
Potassium: 5.5 mEq/L — ABNORMAL HIGH (ref 3.5–5.1)
Sodium: 144 mEq/L (ref 135–145)

## 2011-03-04 LAB — BASIC METABOLIC PANEL
Chloride: 107 mEq/L (ref 96–112)
Creatinine, Ser: 1.1 mg/dL (ref 0.4–1.2)
GFR: 52.68 mL/min — ABNORMAL LOW (ref >60.00)
Potassium: 5.5 mEq/L — ABNORMAL HIGH (ref 3.5–5.1)

## 2011-03-04 MED ORDER — KETOROLAC TROMETHAMINE 30 MG/ML IM SOLN: 30.0000 mg | Freq: Once | INTRAMUSCULAR | Status: AC

## 2011-03-04 MED ORDER — KETOROLAC TROMETHAMINE 30 MG/ML IM SOLN
30.0000 mg | Freq: Once | INTRAMUSCULAR | Status: AC
  Administered 2011-03-04: 30 mg via INTRAMUSCULAR

## 2011-03-04 MED ORDER — SODIUM POLYSTYRENE SULFONATE PO POWD: ORAL | Status: DC

## 2011-03-04 NOTE — Patient Instructions (Signed)
Good to see you. We have given you Toradol in the office today. Please update me with your symptoms. We are checking labs today. We will call you with your results.

## 2011-03-04 NOTE — Telephone Encounter (Signed)
Form for long term care insurance completed and faxed back to [employer].

## 2011-03-04 NOTE — Progress Notes (Signed)
78 yo well known to me with complex medical history, including MELAS, COPD, HTN here with daughter [name] for "not feeling well."  Recently admitted to [facility] with subsequent SNF failure for respiratory failure secondary to acute COPD exacerbation requiring new O2 requirement, which she has weaned off of successfully.  HA-  Woke up with left sided frontal headache today with photophobia and nausea.  Has had headaches like this in past, never diagnosed with migraine or other type of headache. No focal neurological symptoms.  Back pain- has had right sided back pain for four days.  She is concerned "it is my kidneys." No dysuria but she feels she is not urinating as frequently. No fevers, no dysuria. Does have a h/o recurrent UTIs in past.  Patient Active Problem List  Diagnoses  . Bronchitis  . Thrush  . MELAS (mitochondrial encephalopathy, lactic acidosis and stroke-like episodes)  . HTN (hypertension)  . COPD (chronic obstructive pulmonary disease)  . Depression  . Thyroid disease  . Cough  . Shortness of breath  . Fatigue  . Neuropathy  . Insomnia  . Breast pain  . Respiratory failure, acute-on-chronic   Past Medical History  Diagnosis Date  . HTN (hypertension)   . COPD (chronic obstructive pulmonary disease)   . Depression   . Thyroid disease   . HLD (hyperlipidemia)   . CAD (coronary artery disease)     h/o NSTEMI, LAD stent placement 10/04, cath 05/11/2010  . PMR (polymyalgia rheumatica)     with h/o steroid induced myopathy  . Anxiety   . Diastolic dysfunction     Echo 05/11/2010, EF 65-70%  . TIA (transient ischemic attack)     11/05 s/p L CEA   Past Surgical History  Procedure Date  . Cholecystectomy   . Carotid endarterectomy     left with stent placement 11/2003  . Iliac artery stent     right, 2001   History  Substance Use Topics  . Smoking status: Former Smoker    Quit date: 01/05/1996  . Smokeless tobacco: Never Used  . Alcohol Use: No   No family  history on file. Allergies  Allergen Reactions  . Codeine Other (See Comments)    Coma for 2 days  . Prednisone Other (See Comments)    Weight gain,puffy face   Current Outpatient Prescriptions on File Prior to Visit  Medication Sig Dispense Refill  . aspirin EC 81 MG tablet Take 81 mg by mouth daily.        Marland Kitchen atorvastatin (LIPITOR) 40 MG tablet Take 1 tablet (40 mg total) by mouth daily.  90 tablet  3  . clopidogrel (PLAVIX) 75 MG tablet Take 1 tablet (75 mg total) by mouth daily.  90 tablet  3  . gabapentin (NEURONTIN) 800 MG tablet 1 capsule by mouth in AM and 2 capsules by mouth in PM. 1 capsule by mouth in AM and 2 capsules by mouth in PM.  180 tablet  3  . Levothyroxine Sodium 25 MCG CAPS Take 1 capsule by mouth daily.        Marland Kitchen lidocaine (LINDAMANTLE) 3 % CREA cream Apply 1 application topically as needed.        Marland Kitchen lisinopril (PRINIVIL,ZESTRIL) 10 MG tablet Take 20 mg by mouth daily.        . metoprolol tartrate (LOPRESSOR) 25 MG tablet Take 0.5 tablets (12.5 mg total) by mouth daily.  90 tablet  3  . nitroGLYCERIN (NITROSTAT) 0.4 MG SL tablet Place 0.4  mg under the tongue every 5 (five) minutes as needed.        . ranitidine (ZANTAC) 300 MG tablet Take 1 tablet (300 mg total) by mouth at bedtime.  90 tablet  3  . sertraline (ZOLOFT) 50 MG tablet Take 1 tablet (50 mg total) by mouth daily.  90 tablet  3  . tiotropium (SPIRIVA HANDIHALER) 18 MCG inhalation capsule Place 1 capsule (18 mcg total) into inhaler and inhale daily.  30 capsule  0   The PMH, PSH, Social History, Family History, Medications, and allergies have been reviewed in [facility], and have been updated if relevant.     ROS: See HPI   Physical exam: BP 120/64  Pulse 72  Temp(Src) 98.1 F (36.7 C) (Oral)  Wt 194 lb (87.998 kg)  General:  Well-developed,well-nourished,in no acute distress; alert,appropriate and cooperative throughout examination Head:  normocephalic and atraumatic.   Eyes:  vision grossly intact,  pupils equal, pupils round, and pupils reactive to light.   Ears:  R ear normal and L ear normal.   Nose:  no external deformity Heart:  Normal rate and regular rhythm. S1 and S2 normal without gallop, murmur, click, rub or other extra sounds. Skin:  Intact without suspicious lesions or rashes MSK:  Neg CVA tenderness, Neg SLR bilaterally Psych:  Cognition and judgment appear intact. Alert and cooperative with normal attention span and concentration. No apparent delusions, illusions, hallucinations Neuro:  CN II-XII intact, no facial droop, does appear to have some photophobia  Assessment and Plan: 1. Right low back pain  Probable MSK, UA neg. Will check Renal panel to reassure pt. If all normal, will consider imaging of her back--no red flag symptoms on exam. The patient and her daughter indicate understanding of these issues and agrees with the plan.    Basic Metabolic Panel, Renal Function Panel  2. Headache   Consistent with migraine. Given her other medications, I would like to avoid imitrex. IM toradol in office given.

## 2011-03-04 NOTE — Progress Notes (Signed)
Addended by: Ricki [name] on: 03/04/2011 12:44 PM   Modules accepted: Orders

## 2011-03-08 ENCOUNTER — Other Ambulatory Visit: Payer: Medicare Other

## 2011-03-10 ENCOUNTER — Other Ambulatory Visit (INDEPENDENT_AMBULATORY_CARE_PROVIDER_SITE_OTHER): Payer: Medicare Other

## 2011-03-10 ENCOUNTER — Ambulatory Visit: Payer: Medicare Other | Admitting: Family Medicine

## 2011-03-10 DIAGNOSIS — E876 Hypokalemia: Secondary | ICD-10-CM | POA: Diagnosis not present

## 2011-03-10 LAB — BASIC METABOLIC PANEL
BUN: 18 mg/dL (ref 6–23)
Calcium: 9 mg/dL (ref 8.4–10.5)
GFR: 55.67 mL/min — ABNORMAL LOW (ref >60.00)
Glucose, Bld: 116 mg/dL — ABNORMAL HIGH (ref 70–99)
Sodium: 143 mEq/L (ref 135–145)

## 2011-03-11 DIAGNOSIS — M25559 Pain in unspecified hip: Secondary | ICD-10-CM | POA: Diagnosis not present

## 2011-03-11 DIAGNOSIS — M6281 Muscle weakness (generalized): Secondary | ICD-10-CM | POA: Diagnosis not present

## 2011-03-11 DIAGNOSIS — R262 Difficulty in walking, not elsewhere classified: Secondary | ICD-10-CM | POA: Diagnosis not present

## 2011-03-12 ENCOUNTER — Other Ambulatory Visit: Payer: Self-pay | Admitting: Family Medicine

## 2011-03-12 MED ORDER — FLUTICASONE-SALMETEROL 100-50 MCG/DOSE IN AEPB: 1.0000 | INHALATION_SPRAY | Freq: Two times a day (BID) | RESPIRATORY_TRACT | Status: DC

## 2011-03-16 DIAGNOSIS — R262 Difficulty in walking, not elsewhere classified: Secondary | ICD-10-CM | POA: Diagnosis not present

## 2011-03-16 DIAGNOSIS — M25559 Pain in unspecified hip: Secondary | ICD-10-CM | POA: Diagnosis not present

## 2011-03-16 DIAGNOSIS — M6281 Muscle weakness (generalized): Secondary | ICD-10-CM | POA: Diagnosis not present

## 2011-03-18 DIAGNOSIS — M25559 Pain in unspecified hip: Secondary | ICD-10-CM | POA: Diagnosis not present

## 2011-03-18 DIAGNOSIS — M6281 Muscle weakness (generalized): Secondary | ICD-10-CM | POA: Diagnosis not present

## 2011-03-18 DIAGNOSIS — R262 Difficulty in walking, not elsewhere classified: Secondary | ICD-10-CM | POA: Diagnosis not present

## 2011-03-19 DIAGNOSIS — M6281 Muscle weakness (generalized): Secondary | ICD-10-CM | POA: Diagnosis not present

## 2011-03-19 DIAGNOSIS — M25559 Pain in unspecified hip: Secondary | ICD-10-CM | POA: Diagnosis not present

## 2011-03-19 DIAGNOSIS — R262 Difficulty in walking, not elsewhere classified: Secondary | ICD-10-CM | POA: Diagnosis not present

## 2011-03-22 ENCOUNTER — Other Ambulatory Visit: Payer: Self-pay | Admitting: Family Medicine

## 2011-03-22 MED ORDER — FLUTICASONE-SALMETEROL 250-50 MCG/DOSE IN AEPB: 1.0000 | INHALATION_SPRAY | Freq: Two times a day (BID) | RESPIRATORY_TRACT | Status: DC

## 2011-03-25 DIAGNOSIS — M6281 Muscle weakness (generalized): Secondary | ICD-10-CM | POA: Diagnosis not present

## 2011-03-25 DIAGNOSIS — M25559 Pain in unspecified hip: Secondary | ICD-10-CM | POA: Diagnosis not present

## 2011-03-25 DIAGNOSIS — R262 Difficulty in walking, not elsewhere classified: Secondary | ICD-10-CM | POA: Diagnosis not present

## 2011-03-26 DIAGNOSIS — M25559 Pain in unspecified hip: Secondary | ICD-10-CM | POA: Diagnosis not present

## 2011-03-26 DIAGNOSIS — M6281 Muscle weakness (generalized): Secondary | ICD-10-CM | POA: Diagnosis not present

## 2011-03-26 DIAGNOSIS — R262 Difficulty in walking, not elsewhere classified: Secondary | ICD-10-CM | POA: Diagnosis not present

## 2011-03-30 ENCOUNTER — Other Ambulatory Visit: Payer: Self-pay | Admitting: Family Medicine

## 2011-03-30 DIAGNOSIS — R262 Difficulty in walking, not elsewhere classified: Secondary | ICD-10-CM | POA: Diagnosis not present

## 2011-03-30 DIAGNOSIS — M25559 Pain in unspecified hip: Secondary | ICD-10-CM | POA: Diagnosis not present

## 2011-03-30 DIAGNOSIS — M6281 Muscle weakness (generalized): Secondary | ICD-10-CM | POA: Diagnosis not present

## 2011-04-01 DIAGNOSIS — M6281 Muscle weakness (generalized): Secondary | ICD-10-CM | POA: Diagnosis not present

## 2011-04-01 DIAGNOSIS — R262 Difficulty in walking, not elsewhere classified: Secondary | ICD-10-CM | POA: Diagnosis not present

## 2011-04-01 DIAGNOSIS — M25559 Pain in unspecified hip: Secondary | ICD-10-CM | POA: Diagnosis not present

## 2011-04-02 DIAGNOSIS — M25559 Pain in unspecified hip: Secondary | ICD-10-CM | POA: Diagnosis not present

## 2011-04-02 DIAGNOSIS — R262 Difficulty in walking, not elsewhere classified: Secondary | ICD-10-CM | POA: Diagnosis not present

## 2011-04-02 DIAGNOSIS — M6281 Muscle weakness (generalized): Secondary | ICD-10-CM | POA: Diagnosis not present

## 2011-04-28 DIAGNOSIS — H4010X0 Unspecified open-angle glaucoma, stage unspecified: Secondary | ICD-10-CM | POA: Diagnosis not present

## 2011-05-07 ENCOUNTER — Other Ambulatory Visit: Payer: Self-pay | Admitting: Family Medicine

## 2011-06-02 DIAGNOSIS — R0602 Shortness of breath: Secondary | ICD-10-CM | POA: Diagnosis not present

## 2011-06-03 ENCOUNTER — Inpatient Hospital Stay: Payer: Self-pay | Admitting: Specialist

## 2011-06-03 DIAGNOSIS — Z7902 Long term (current) use of antithrombotics/antiplatelets: Secondary | ICD-10-CM | POA: Diagnosis not present

## 2011-06-03 DIAGNOSIS — M353 Polymyalgia rheumatica: Secondary | ICD-10-CM | POA: Diagnosis present

## 2011-06-03 DIAGNOSIS — Z823 Family history of stroke: Secondary | ICD-10-CM | POA: Diagnosis not present

## 2011-06-03 DIAGNOSIS — F341 Dysthymic disorder: Secondary | ICD-10-CM | POA: Diagnosis present

## 2011-06-03 DIAGNOSIS — G609 Hereditary and idiopathic neuropathy, unspecified: Secondary | ICD-10-CM | POA: Diagnosis present

## 2011-06-03 DIAGNOSIS — R079 Chest pain, unspecified: Secondary | ICD-10-CM | POA: Diagnosis not present

## 2011-06-03 DIAGNOSIS — Z9861 Coronary angioplasty status: Secondary | ICD-10-CM | POA: Diagnosis not present

## 2011-06-03 DIAGNOSIS — I2510 Atherosclerotic heart disease of native coronary artery without angina pectoris: Secondary | ICD-10-CM | POA: Diagnosis not present

## 2011-06-03 DIAGNOSIS — J438 Other emphysema: Secondary | ICD-10-CM | POA: Diagnosis present

## 2011-06-03 DIAGNOSIS — I672 Cerebral atherosclerosis: Secondary | ICD-10-CM | POA: Diagnosis present

## 2011-06-03 DIAGNOSIS — Z885 Allergy status to narcotic agent status: Secondary | ICD-10-CM | POA: Diagnosis not present

## 2011-06-03 DIAGNOSIS — J9620 Acute and chronic respiratory failure, unspecified whether with hypoxia or hypercapnia: Secondary | ICD-10-CM | POA: Diagnosis not present

## 2011-06-03 DIAGNOSIS — I959 Hypotension, unspecified: Secondary | ICD-10-CM | POA: Diagnosis not present

## 2011-06-03 DIAGNOSIS — Z9109 Other allergy status, other than to drugs and biological substances: Secondary | ICD-10-CM | POA: Diagnosis not present

## 2011-06-03 DIAGNOSIS — R509 Fever, unspecified: Secondary | ICD-10-CM | POA: Diagnosis not present

## 2011-06-03 DIAGNOSIS — Z7982 Long term (current) use of aspirin: Secondary | ICD-10-CM | POA: Diagnosis not present

## 2011-06-03 DIAGNOSIS — E669 Obesity, unspecified: Secondary | ICD-10-CM | POA: Diagnosis present

## 2011-06-03 DIAGNOSIS — R0602 Shortness of breath: Secondary | ICD-10-CM | POA: Diagnosis not present

## 2011-06-03 DIAGNOSIS — R0789 Other chest pain: Secondary | ICD-10-CM | POA: Diagnosis present

## 2011-06-03 DIAGNOSIS — Z87891 Personal history of nicotine dependence: Secondary | ICD-10-CM | POA: Diagnosis not present

## 2011-06-03 DIAGNOSIS — Z8673 Personal history of transient ischemic attack (TIA), and cerebral infarction without residual deficits: Secondary | ICD-10-CM | POA: Diagnosis not present

## 2011-06-03 DIAGNOSIS — Z79899 Other long term (current) drug therapy: Secondary | ICD-10-CM | POA: Diagnosis not present

## 2011-06-03 DIAGNOSIS — Z881 Allergy status to other antibiotic agents status: Secondary | ICD-10-CM | POA: Diagnosis not present

## 2011-06-03 DIAGNOSIS — Z882 Allergy status to sulfonamides status: Secondary | ICD-10-CM | POA: Diagnosis not present

## 2011-06-03 DIAGNOSIS — Z888 Allergy status to other drugs, medicaments and biological substances status: Secondary | ICD-10-CM | POA: Diagnosis not present

## 2011-06-03 DIAGNOSIS — J189 Pneumonia, unspecified organism: Secondary | ICD-10-CM | POA: Diagnosis not present

## 2011-06-03 DIAGNOSIS — E785 Hyperlipidemia, unspecified: Secondary | ICD-10-CM | POA: Diagnosis not present

## 2011-06-03 DIAGNOSIS — I10 Essential (primary) hypertension: Secondary | ICD-10-CM | POA: Diagnosis present

## 2011-06-03 DIAGNOSIS — F0150 Vascular dementia without behavioral disturbance: Secondary | ICD-10-CM | POA: Diagnosis present

## 2011-06-03 DIAGNOSIS — E039 Hypothyroidism, unspecified: Secondary | ICD-10-CM | POA: Diagnosis present

## 2011-06-03 LAB — CK TOTAL AND CKMB (NOT AT ARMC)
CK, Total: 73 U/L (ref 21–215)
CK, Total: 57 U/L (ref 21–215)
CK-MB: <0.5 ng/mL — ABNORMAL LOW (ref 0.5–3.6)
CK-MB: 0.9 ng/mL (ref 0.5–3.6)

## 2011-06-03 LAB — BASIC METABOLIC PANEL
Anion Gap: 10 (ref 7–16)
Calcium, Total: 8.5 mg/dL (ref 8.5–10.1)
Chloride: 105 mmol/L (ref 98–107)
EGFR (African American): 60 — ABNORMAL LOW
EGFR (Non-African Amer.): 51 — ABNORMAL LOW
Osmolality: 288 (ref 275–301)
Potassium: 3.9 mmol/L (ref 3.5–5.1)
Sodium: 143 mmol/L (ref 136–145)

## 2011-06-03 LAB — CBC WITH DIFFERENTIAL/PLATELET
Basophil %: 0.9 %
Eosinophil #: 0.2 10*3/uL (ref 0.0–0.7)
Lymphocyte #: 1 10*3/uL (ref 1.0–3.6)
MCV: 89 fL (ref 80–100)
Monocyte %: 10.2 %
Neutrophil #: 5.8 10*3/uL (ref 1.4–6.5)
RBC: 4.1 10*6/uL (ref 3.80–5.20)
WBC: 7.9 10*3/uL (ref 3.6–11.0)

## 2011-06-03 LAB — URINALYSIS, COMPLETE
Bilirubin,UR: NEGATIVE
Glucose,UR: NEGATIVE mg/dL (ref 0–75)
Nitrite: NEGATIVE
RBC,UR: 1 /HPF (ref 0–5)
Specific Gravity: 1.021 (ref 1.003–1.030)
Squamous Epithelial: 1

## 2011-06-03 LAB — SEDIMENTATION RATE: Erythrocyte Sed Rate: 50 mm/hr — ABNORMAL HIGH (ref 0–30)

## 2011-06-04 DIAGNOSIS — E785 Hyperlipidemia, unspecified: Secondary | ICD-10-CM | POA: Diagnosis not present

## 2011-06-04 DIAGNOSIS — R079 Chest pain, unspecified: Secondary | ICD-10-CM | POA: Diagnosis not present

## 2011-06-04 DIAGNOSIS — I959 Hypotension, unspecified: Secondary | ICD-10-CM | POA: Diagnosis not present

## 2011-06-04 DIAGNOSIS — J9620 Acute and chronic respiratory failure, unspecified whether with hypoxia or hypercapnia: Secondary | ICD-10-CM | POA: Diagnosis not present

## 2011-06-04 DIAGNOSIS — I2510 Atherosclerotic heart disease of native coronary artery without angina pectoris: Secondary | ICD-10-CM | POA: Diagnosis not present

## 2011-06-04 LAB — CBC WITH DIFFERENTIAL/PLATELET
Eosinophil %: 3.8 %
HCT: 33.8 % — ABNORMAL LOW (ref 35.0–47.0)
HGB: 11.2 g/dL — ABNORMAL LOW (ref 12.0–16.0)
Lymphocyte #: 0.7 10*3/uL — ABNORMAL LOW (ref 1.0–3.6)
MCH: 29.3 pg (ref 26.0–34.0)
MCHC: 33.1 g/dL (ref 32.0–36.0)
MCV: 89 fL (ref 80–100)
Monocyte #: 0.7 x10 3/mm (ref 0.2–0.9)
Monocyte %: 11.1 %
Neutrophil #: 4.5 10*3/uL (ref 1.4–6.5)
Neutrophil %: 73.4 %
Platelet: 190 10*3/uL (ref 150–440)
RDW: 15.1 % — ABNORMAL HIGH (ref 11.5–14.5)

## 2011-06-04 LAB — BASIC METABOLIC PANEL
Calcium, Total: 8.3 mg/dL — ABNORMAL LOW (ref 8.5–10.1)
Creatinine: 1.07 mg/dL (ref 0.60–1.30)
EGFR (Non-African Amer.): 50 — ABNORMAL LOW
Glucose: 98 mg/dL (ref 65–99)
Osmolality: 291 (ref 275–301)
Potassium: 3.9 mmol/L (ref 3.5–5.1)
Sodium: 145 mmol/L (ref 136–145)

## 2011-06-04 LAB — URINE CULTURE

## 2011-06-04 LAB — MAGNESIUM: Magnesium: 1.9 mg/dL

## 2011-06-07 DIAGNOSIS — R0602 Shortness of breath: Secondary | ICD-10-CM | POA: Diagnosis not present

## 2011-06-07 DIAGNOSIS — J449 Chronic obstructive pulmonary disease, unspecified: Secondary | ICD-10-CM | POA: Diagnosis not present

## 2011-06-07 DIAGNOSIS — J8410 Pulmonary fibrosis, unspecified: Secondary | ICD-10-CM | POA: Diagnosis not present

## 2011-06-07 DIAGNOSIS — J438 Other emphysema: Secondary | ICD-10-CM | POA: Diagnosis not present

## 2011-06-08 DIAGNOSIS — J438 Other emphysema: Secondary | ICD-10-CM | POA: Diagnosis not present

## 2011-06-08 DIAGNOSIS — F411 Generalized anxiety disorder: Secondary | ICD-10-CM | POA: Diagnosis not present

## 2011-06-08 DIAGNOSIS — I10 Essential (primary) hypertension: Secondary | ICD-10-CM | POA: Diagnosis not present

## 2011-06-08 DIAGNOSIS — F329 Major depressive disorder, single episode, unspecified: Secondary | ICD-10-CM | POA: Diagnosis not present

## 2011-06-08 DIAGNOSIS — F0150 Vascular dementia without behavioral disturbance: Secondary | ICD-10-CM | POA: Diagnosis not present

## 2011-06-08 DIAGNOSIS — M353 Polymyalgia rheumatica: Secondary | ICD-10-CM | POA: Diagnosis not present

## 2011-06-08 LAB — CULTURE, BLOOD (SINGLE)

## 2011-06-09 DIAGNOSIS — R0602 Shortness of breath: Secondary | ICD-10-CM | POA: Diagnosis not present

## 2011-06-10 DIAGNOSIS — I10 Essential (primary) hypertension: Secondary | ICD-10-CM | POA: Diagnosis not present

## 2011-06-10 DIAGNOSIS — F0150 Vascular dementia without behavioral disturbance: Secondary | ICD-10-CM | POA: Diagnosis not present

## 2011-06-10 DIAGNOSIS — F329 Major depressive disorder, single episode, unspecified: Secondary | ICD-10-CM | POA: Diagnosis not present

## 2011-06-10 DIAGNOSIS — M353 Polymyalgia rheumatica: Secondary | ICD-10-CM | POA: Diagnosis not present

## 2011-06-10 DIAGNOSIS — F411 Generalized anxiety disorder: Secondary | ICD-10-CM | POA: Diagnosis not present

## 2011-06-10 DIAGNOSIS — J438 Other emphysema: Secondary | ICD-10-CM | POA: Diagnosis not present

## 2011-06-15 DIAGNOSIS — F329 Major depressive disorder, single episode, unspecified: Secondary | ICD-10-CM | POA: Diagnosis not present

## 2011-06-15 DIAGNOSIS — M353 Polymyalgia rheumatica: Secondary | ICD-10-CM | POA: Diagnosis not present

## 2011-06-15 DIAGNOSIS — F411 Generalized anxiety disorder: Secondary | ICD-10-CM | POA: Diagnosis not present

## 2011-06-15 DIAGNOSIS — I10 Essential (primary) hypertension: Secondary | ICD-10-CM | POA: Diagnosis not present

## 2011-06-15 DIAGNOSIS — F0150 Vascular dementia without behavioral disturbance: Secondary | ICD-10-CM | POA: Diagnosis not present

## 2011-06-15 DIAGNOSIS — J438 Other emphysema: Secondary | ICD-10-CM | POA: Diagnosis not present

## 2011-06-16 DIAGNOSIS — J449 Chronic obstructive pulmonary disease, unspecified: Secondary | ICD-10-CM | POA: Diagnosis not present

## 2011-06-16 DIAGNOSIS — R0602 Shortness of breath: Secondary | ICD-10-CM | POA: Diagnosis not present

## 2011-06-18 ENCOUNTER — Telehealth: Payer: Self-pay | Admitting: *Deleted

## 2011-06-18 DIAGNOSIS — M353 Polymyalgia rheumatica: Secondary | ICD-10-CM | POA: Diagnosis not present

## 2011-06-18 DIAGNOSIS — I10 Essential (primary) hypertension: Secondary | ICD-10-CM | POA: Diagnosis not present

## 2011-06-18 DIAGNOSIS — F411 Generalized anxiety disorder: Secondary | ICD-10-CM | POA: Diagnosis not present

## 2011-06-18 DIAGNOSIS — F329 Major depressive disorder, single episode, unspecified: Secondary | ICD-10-CM | POA: Diagnosis not present

## 2011-06-18 DIAGNOSIS — F0150 Vascular dementia without behavioral disturbance: Secondary | ICD-10-CM | POA: Diagnosis not present

## 2011-06-18 DIAGNOSIS — J438 Other emphysema: Secondary | ICD-10-CM | POA: Diagnosis not present

## 2011-06-18 NOTE — Telephone Encounter (Signed)
Agreed.  I already informed her daughter this was ok.

## 2011-06-18 NOTE — Telephone Encounter (Signed)
Home health nurse called to ask if ok for patient to use mucinex to loosen secretions.  Pharmacist told her it would be ok

## 2011-06-22 ENCOUNTER — Other Ambulatory Visit (INDEPENDENT_AMBULATORY_CARE_PROVIDER_SITE_OTHER): Payer: Medicare Other | Admitting: *Deleted

## 2011-06-22 DIAGNOSIS — R300 Dysuria: Secondary | ICD-10-CM | POA: Diagnosis not present

## 2011-06-22 LAB — POCT URINALYSIS DIPSTICK
Glucose, UA: NEGATIVE
Nitrite, UA: NEGATIVE
Protein, UA: NEGATIVE
Urobilinogen, UA: NEGATIVE
pH, UA: 5

## 2011-06-23 ENCOUNTER — Encounter: Payer: Self-pay | Admitting: Family Medicine

## 2011-06-23 ENCOUNTER — Ambulatory Visit (INDEPENDENT_AMBULATORY_CARE_PROVIDER_SITE_OTHER): Payer: Medicare Other | Admitting: Family Medicine

## 2011-06-23 VITALS — BP 130/42 | HR 56 | Temp 98.3°F | Wt 201.0 lb

## 2011-06-23 DIAGNOSIS — R350 Frequency of micturition: Secondary | ICD-10-CM

## 2011-06-23 DIAGNOSIS — R059 Cough, unspecified: Secondary | ICD-10-CM

## 2011-06-23 DIAGNOSIS — J449 Chronic obstructive pulmonary disease, unspecified: Secondary | ICD-10-CM

## 2011-06-23 DIAGNOSIS — R05 Cough: Secondary | ICD-10-CM | POA: Diagnosis not present

## 2011-06-23 NOTE — Progress Notes (Signed)
78 yo with h/o COPD here for cough.    Treated with Zpack by her pulmonologist, Dr. Chancy Milroy,  and overall symptoms have improved but still has a nagging dry cough. No SOB or CP.    On oxygen continuously now per pulm. Pt's daughter is with her today and reports that pulm thinks there may also be a component of interstitial lung disease.  Treatment options are limited to her since she is allergic to prednisone.  Afebrile.  UA checked yesterday as she was having some increased frequency which has resolved. UA pos for trace blood only - sent for cx.  Patient Active Problem List  Diagnosis  . Bronchitis  . Thrush  . MELAS (mitochondrial encephalopathy, lactic acidosis and stroke-like episodes)  . HTN (hypertension)  . COPD (chronic obstructive pulmonary disease)  . Depression  . Thyroid disease  . Cough  . Shortness of breath  . Fatigue  . Neuropathy  . Insomnia  . Breast pain  . Respiratory failure, acute-on-chronic  . Left low back pain  . Headache   Past Medical History  Diagnosis Date  . HTN (hypertension)   . COPD (chronic obstructive pulmonary disease)   . Depression   . Thyroid disease   . HLD (hyperlipidemia)   . CAD (coronary artery disease)     h/o NSTEMI, LAD stent placement 10/04, cath 05/11/2010  . PMR (polymyalgia rheumatica)     with h/o steroid induced myopathy  . Anxiety   . Diastolic dysfunction     Echo 05/11/2010, EF 65-70%  . TIA (transient ischemic attack)     11/05 s/p L CEA   Past Surgical History  Procedure Date  . Cholecystectomy   . Carotid endarterectomy     left with stent placement 11/2003  . Iliac artery stent     right, 2001   History  Substance Use Topics  . Smoking status: Former Smoker    Quit date: 01/05/1996  . Smokeless tobacco: Never Used  . Alcohol Use: No   No family history on file. Allergies  Allergen Reactions  . Codeine Other (See Comments)    Coma for 2 days  . Prednisone Other (See Comments)    Weight gain,puffy  face   Current Outpatient Prescriptions on File Prior to Visit  Medication Sig Dispense Refill  . aspirin EC 81 MG tablet Take 81 mg by mouth daily.        Marland Kitchen atorvastatin (LIPITOR) 40 MG tablet Take 1 tablet (40 mg total) by mouth daily.  90 tablet  3  . clopidogrel (PLAVIX) 75 MG tablet Take 1 tablet (75 mg total) by mouth daily.  90 tablet  3  . Fluticasone-Salmeterol (ADVAIR DISKUS) 250-50 MCG/DOSE AEPB Inhale 1 puff into the lungs 2 (two) times daily.  1 each  3  . gabapentin (NEURONTIN) 800 MG tablet 1 capsule by mouth in AM and 2 capsules by mouth in PM. 1 capsule by mouth in AM and 2 capsules by mouth in PM.  180 tablet  3  . ketorolac (TORADOL) 30 MG/ML injection Inject 1 mL (30 mg total) into the muscle once.  1 mL  0  . Levothyroxine Sodium 25 MCG CAPS Take 1 capsule by mouth daily.        Marland Kitchen lidocaine (LINDAMANTLE) 3 % CREA cream Apply 1 application topically as needed.        Marland Kitchen lisinopril (PRINIVIL,ZESTRIL) 10 MG tablet Take 20 mg by mouth daily.        Marland Kitchen  metoprolol tartrate (LOPRESSOR) 25 MG tablet Take 0.5 tablets (12.5 mg total) by mouth daily.  90 tablet  3  . nitroGLYCERIN (NITROSTAT) 0.4 MG SL tablet Place 0.4 mg under the tongue every 5 (five) minutes as needed.        . ranitidine (ZANTAC) 300 MG tablet Take 1 tablet (300 mg total) by mouth at bedtime.  90 tablet  3  . sertraline (ZOLOFT) 50 MG tablet Take 1 tablet (50 mg total) by mouth daily.  90 tablet  3  . sodium polystyrene (KAYEXALATE) powder 15 grams by mouth x 1.  30 g  0  . SPIRIVA HANDIHALER 18 MCG inhalation capsule PLACE 1 CAPSULE (18 MCG TOTAL) INTO INHALER AND INHALE DAILY.  1 each  3  . traZODone (DESYREL) 50 MG tablet TAKE 0.5-1 TABLETS (25-50 MG TOTAL) BY MOUTH AT BEDTIME AS NEEDED FOR SLEEP.  30 tablet  3   The PMH, PSH, Social History, Family History, Medications, and allergies have been reviewed in [facility], and have been updated if relevant.     ROS: See HPI No dysuria. No back pain.  Physical  exam: BP 130/42  Pulse 56  Temp 98.3 F (36.8 C)  Wt 201 lb (91.173 kg)  SpO2 96%  General:  Well-developed,well-nourished,in no acute distress; alert,appropriate and cooperative throughout examination Head:  normocephalic and atraumatic.   Eyes:  vision grossly intact, pupils equal, pupils round, and pupils reactive to light.   Ears:  R ear normal and L ear normal.   Nose:  no external deformity, pos O2 per [place] Mouth:  good dentition,  pos PND Lungs:  Normal respiratory effort, chest expands symmetrically. No wheezes or crackles. Heart:  Normal rate and regular rhythm. S1 and S2 normal without gallop, murmur, click, rub or other extra sounds. Skin:  Intact without suspicious lesions or rashes Psych:  Cognition and judgment appear intact. Alert and cooperative with normal attention span and concentration. No apparent delusions, illusions, hallucinations  Assessment and Plan: 1. COPD (chronic obstructive pulmonary disease)  Gradual decline. Awaiting records from Dr. Chancy Milroy.  2. Cough  Improving s/p Zpack. Pt's daughter will keep me posted with symptoms. No new rx today.  3. Urinary frequency  Improving. Awaiting urine cx.

## 2011-06-24 DIAGNOSIS — I10 Essential (primary) hypertension: Secondary | ICD-10-CM

## 2011-06-24 DIAGNOSIS — F411 Generalized anxiety disorder: Secondary | ICD-10-CM | POA: Diagnosis not present

## 2011-06-24 DIAGNOSIS — F329 Major depressive disorder, single episode, unspecified: Secondary | ICD-10-CM | POA: Diagnosis not present

## 2011-06-24 DIAGNOSIS — J438 Other emphysema: Secondary | ICD-10-CM | POA: Diagnosis not present

## 2011-06-24 DIAGNOSIS — F0150 Vascular dementia without behavioral disturbance: Secondary | ICD-10-CM | POA: Diagnosis not present

## 2011-06-24 DIAGNOSIS — M353 Polymyalgia rheumatica: Secondary | ICD-10-CM

## 2011-06-25 DIAGNOSIS — I509 Heart failure, unspecified: Secondary | ICD-10-CM | POA: Diagnosis not present

## 2011-07-05 DIAGNOSIS — J449 Chronic obstructive pulmonary disease, unspecified: Secondary | ICD-10-CM | POA: Diagnosis not present

## 2011-07-05 DIAGNOSIS — R0602 Shortness of breath: Secondary | ICD-10-CM | POA: Diagnosis not present

## 2011-07-05 DIAGNOSIS — I279 Pulmonary heart disease, unspecified: Secondary | ICD-10-CM | POA: Diagnosis not present

## 2011-07-05 DIAGNOSIS — J438 Other emphysema: Secondary | ICD-10-CM | POA: Diagnosis not present

## 2011-07-05 DIAGNOSIS — J9620 Acute and chronic respiratory failure, unspecified whether with hypoxia or hypercapnia: Secondary | ICD-10-CM | POA: Diagnosis not present

## 2011-07-12 ENCOUNTER — Other Ambulatory Visit: Payer: Self-pay | Admitting: Family Medicine

## 2011-07-13 DIAGNOSIS — F411 Generalized anxiety disorder: Secondary | ICD-10-CM | POA: Diagnosis not present

## 2011-07-13 DIAGNOSIS — F329 Major depressive disorder, single episode, unspecified: Secondary | ICD-10-CM | POA: Diagnosis not present

## 2011-07-13 DIAGNOSIS — F0150 Vascular dementia without behavioral disturbance: Secondary | ICD-10-CM | POA: Diagnosis not present

## 2011-07-13 DIAGNOSIS — J438 Other emphysema: Secondary | ICD-10-CM | POA: Diagnosis not present

## 2011-07-13 DIAGNOSIS — M353 Polymyalgia rheumatica: Secondary | ICD-10-CM | POA: Diagnosis not present

## 2011-07-13 DIAGNOSIS — I10 Essential (primary) hypertension: Secondary | ICD-10-CM | POA: Diagnosis not present

## 2011-07-20 ENCOUNTER — Other Ambulatory Visit: Payer: Self-pay | Admitting: Family Medicine

## 2011-07-20 MED ORDER — CYCLOBENZAPRINE HCL 5 MG PO TABS: 5.0000 mg | ORAL_TABLET | Freq: Three times a day (TID) | ORAL | Status: DC | PRN

## 2011-07-20 NOTE — Telephone Encounter (Signed)
Daughter called stating that her mom is having muscle spasms. No red flag symptoms- no changes in bowel or bladder habits. No fevers. No radiculopathy. Had to sit long hours for son in [place] and she thinks that caused the pain. Taking tylenol, using heating pad without relief.  She is asking for something stronger. Discussed increased fall risk and sedation of muscle relaxants. Rx for flexeril sent to [facility] on [address]. The patient indicates understanding of these issues and agrees with the plan.

## 2011-08-04 ENCOUNTER — Telehealth: Payer: Self-pay | Admitting: *Deleted

## 2011-08-04 DIAGNOSIS — J438 Other emphysema: Secondary | ICD-10-CM | POA: Diagnosis not present

## 2011-08-04 DIAGNOSIS — F0150 Vascular dementia without behavioral disturbance: Secondary | ICD-10-CM | POA: Diagnosis not present

## 2011-08-04 DIAGNOSIS — M353 Polymyalgia rheumatica: Secondary | ICD-10-CM | POA: Diagnosis not present

## 2011-08-04 DIAGNOSIS — F411 Generalized anxiety disorder: Secondary | ICD-10-CM | POA: Diagnosis not present

## 2011-08-04 DIAGNOSIS — I10 Essential (primary) hypertension: Secondary | ICD-10-CM | POA: Diagnosis not present

## 2011-08-04 DIAGNOSIS — F329 Major depressive disorder, single episode, unspecified: Secondary | ICD-10-CM | POA: Diagnosis not present

## 2011-08-04 NOTE — Telephone Encounter (Signed)
Pt's daughter brought in form for long term care insurance.  Form completed, faxed to insurance company and copy sent to be scanned.  Original mailed back to patient.

## 2011-08-26 ENCOUNTER — Other Ambulatory Visit: Payer: Self-pay | Admitting: Family Medicine

## 2011-09-09 ENCOUNTER — Encounter: Payer: Self-pay | Admitting: Family Medicine

## 2011-09-09 ENCOUNTER — Ambulatory Visit (INDEPENDENT_AMBULATORY_CARE_PROVIDER_SITE_OTHER)
Admission: RE | Admit: 2011-09-09 | Discharge: 2011-09-09 | Disposition: A | Discharge status: Home / Self Care | Payer: Medicare Other | Source: Ambulatory Visit | Attending: Family Medicine | Admitting: Family Medicine

## 2011-09-09 ENCOUNTER — Ambulatory Visit (INDEPENDENT_AMBULATORY_CARE_PROVIDER_SITE_OTHER): Payer: Medicare Other | Admitting: Family Medicine

## 2011-09-09 VITALS — BP 120/60 | HR 72 | Temp 98.4°F | Wt 200.0 lb

## 2011-09-09 DIAGNOSIS — M545 Low back pain, unspecified: Secondary | ICD-10-CM | POA: Insufficient documentation

## 2011-09-09 DIAGNOSIS — M47817 Spondylosis without myelopathy or radiculopathy, lumbosacral region: Secondary | ICD-10-CM | POA: Diagnosis not present

## 2011-09-09 MED ORDER — CYCLOBENZAPRINE HCL 5 MG PO TABS: 5.0000 mg | ORAL_TABLET | Freq: Three times a day (TID) | ORAL | Status: AC | PRN

## 2011-09-09 NOTE — Progress Notes (Signed)
SUBJECTIVE:  Joy [name] is a 78 y.o. female who complains of low back pain for 2 month(s), positional with bending or lifting, without radiation down the legs. Precipitating factors: none recalled by the patient. Prior history of back problems: recurrent self limited episodes of low back pain in the past. There is no numbness in the legs.  Patient Active Problem List  Diagnosis  . Bronchitis  . Thrush  . MELAS (mitochondrial encephalopathy, lactic acidosis and stroke-like episodes)  . HTN (hypertension)  . COPD (chronic obstructive pulmonary disease)  . Depression  . Thyroid disease  . Cough  . Shortness of breath  . Fatigue  . Neuropathy  . Insomnia  . Breast pain  . Respiratory failure, acute-on-chronic  . Left low back pain  . Headache  . Urinary frequency  . Low back pain   Past Medical History  Diagnosis Date  . HTN (hypertension)   . COPD (chronic obstructive pulmonary disease)   . Depression   . Thyroid disease   . HLD (hyperlipidemia)   . CAD (coronary artery disease)     h/o NSTEMI, LAD stent placement 10/04, cath 05/11/2010  . PMR (polymyalgia rheumatica)     with h/o steroid induced myopathy  . Anxiety   . Diastolic dysfunction     Echo 05/11/2010, EF 65-70%  . TIA (transient ischemic attack)     11/05 s/p L CEA   Past Surgical History  Procedure Date  . Cholecystectomy   . Carotid endarterectomy     left with stent placement 11/2003  . Iliac artery stent     right, 2001   History  Substance Use Topics  . Smoking status: Former Smoker    Quit date: 01/05/1996  . Smokeless tobacco: Never Used  . Alcohol Use: No   No family history on file. Allergies  Allergen Reactions  . Codeine Other (See Comments)    Coma for 2 days  . Prednisone Other (See Comments)    Weight gain,puffy face   Current Outpatient Prescriptions on File Prior to Visit  Medication Sig Dispense Refill  . ADVAIR DISKUS 250-50 MCG/DOSE AEPB INHALE 1 PUFF INTO THE LUNGS 2  (TWO) TIMES DAILY.  1 each  6  . aspirin EC 81 MG tablet Take 81 mg by mouth daily.        Marland Kitchen atorvastatin (LIPITOR) 40 MG tablet Take 1 tablet (40 mg total) by mouth daily.  90 tablet  3  . clopidogrel (PLAVIX) 75 MG tablet Take 1 tablet (75 mg total) by mouth daily.  90 tablet  3  . gabapentin (NEURONTIN) 800 MG tablet 1 capsule by mouth in AM and 2 capsules by mouth in PM. 1 capsule by mouth in AM and 2 capsules by mouth in PM.  180 tablet  3  . ketorolac (TORADOL) 30 MG/ML injection Inject 1 mL (30 mg total) into the muscle once.  1 mL  0  . Levothyroxine Sodium 25 MCG CAPS Take 1 capsule by mouth daily.        Marland Kitchen lidocaine (LINDAMANTLE) 3 % CREA cream Apply 1 application topically as needed.        Marland Kitchen lisinopril (PRINIVIL,ZESTRIL) 10 MG tablet Take 20 mg by mouth daily.        . metoprolol tartrate (LOPRESSOR) 25 MG tablet Take 0.5 tablets (12.5 mg total) by mouth daily.  90 tablet  3  . nitroGLYCERIN (NITROSTAT) 0.4 MG SL tablet Place 0.4 mg under the tongue every 5 (five) minutes as  needed.        . ranitidine (ZANTAC) 300 MG tablet Take 1 tablet (300 mg total) by mouth at bedtime.  90 tablet  3  . sertraline (ZOLOFT) 50 MG tablet Take 1 tablet (50 mg total) by mouth daily.  90 tablet  3  . sodium polystyrene (KAYEXALATE) powder 15 grams by mouth x 1.  30 g  0  . SPIRIVA HANDIHALER 18 MCG inhalation capsule PLACE 1 CAPSULE (18 MCG TOTAL) INTO INHALER AND INHALE DAILY.  1 each  3  . traZODone (DESYREL) 50 MG tablet TAKE 0.5-1 TABLETS (25-50 MG TOTAL) BY MOUTH AT BEDTIME AS NEEDED FOR SLEEP.  30 tablet  3   The PMH, PSH, Social History, Family History, Medications, and allergies have been reviewed in [facility], and have been updated if relevant.  OBJECTIVE: BP 120/60  Pulse 72  Temp 98.4 F (36.9 C)  Wt 200 lb (90.719 kg)  Patient appears to be in mild to moderate pain, antalgic gait noted. Lumbosacral spine area reveals no local tenderness or mass.  Painful and reduced LS ROM noted. Straight  leg raise is negative bilaterally. DTR's, motor strength and sensation normal, including heel and toe gait.  Peripheral pulses are palpable. X-Ray: ordered, but results not yet available.  ASSESSMENT:  lumbar strain  PLAN: For acute pain, rest, intermittent application of heat (do not sleep on heating pad), analgesics and muscle relaxants are recommended. Discussed longer term treatment plan of prn NSAID's and discussed a home back care exercise program with flexion exercise routine.  Given duration of symptoms, will order xray and refer to PT. Proper lifting with avoidance of heavy lifting discussed.Call or return to clinic prn if these symptoms worsen or fail to improve as anticipated.

## 2011-09-09 NOTE — Patient Instructions (Addendum)
Great to see you. I will call you with your xray results. We will call you with your PT appointment.

## 2011-09-13 DIAGNOSIS — M545 Low back pain: Secondary | ICD-10-CM | POA: Diagnosis not present

## 2011-09-13 DIAGNOSIS — M6281 Muscle weakness (generalized): Secondary | ICD-10-CM | POA: Diagnosis not present

## 2011-09-13 DIAGNOSIS — R262 Difficulty in walking, not elsewhere classified: Secondary | ICD-10-CM | POA: Diagnosis not present

## 2011-09-15 DIAGNOSIS — M545 Low back pain: Secondary | ICD-10-CM | POA: Diagnosis not present

## 2011-09-15 DIAGNOSIS — M6281 Muscle weakness (generalized): Secondary | ICD-10-CM | POA: Diagnosis not present

## 2011-09-15 DIAGNOSIS — R262 Difficulty in walking, not elsewhere classified: Secondary | ICD-10-CM | POA: Diagnosis not present

## 2011-09-16 DIAGNOSIS — R262 Difficulty in walking, not elsewhere classified: Secondary | ICD-10-CM | POA: Diagnosis not present

## 2011-09-16 DIAGNOSIS — M6281 Muscle weakness (generalized): Secondary | ICD-10-CM | POA: Diagnosis not present

## 2011-09-16 DIAGNOSIS — M545 Low back pain: Secondary | ICD-10-CM | POA: Diagnosis not present

## 2011-09-17 ENCOUNTER — Ambulatory Visit (INDEPENDENT_AMBULATORY_CARE_PROVIDER_SITE_OTHER): Payer: Medicare Other | Admitting: *Deleted

## 2011-09-17 DIAGNOSIS — R300 Dysuria: Secondary | ICD-10-CM | POA: Diagnosis not present

## 2011-09-17 LAB — POCT URINALYSIS DIPSTICK
Bilirubin, UA: NEGATIVE
Blood, UA: NEGATIVE
Glucose, UA: NEGATIVE
Ketones, UA: NEGATIVE
Spec Grav, UA: 1.02
pH, UA: 6

## 2011-09-20 DIAGNOSIS — M545 Low back pain: Secondary | ICD-10-CM | POA: Diagnosis not present

## 2011-09-20 DIAGNOSIS — M6281 Muscle weakness (generalized): Secondary | ICD-10-CM | POA: Diagnosis not present

## 2011-09-20 DIAGNOSIS — R262 Difficulty in walking, not elsewhere classified: Secondary | ICD-10-CM | POA: Diagnosis not present

## 2011-09-22 DIAGNOSIS — R262 Difficulty in walking, not elsewhere classified: Secondary | ICD-10-CM | POA: Diagnosis not present

## 2011-09-22 DIAGNOSIS — M545 Low back pain: Secondary | ICD-10-CM | POA: Diagnosis not present

## 2011-09-22 DIAGNOSIS — M6281 Muscle weakness (generalized): Secondary | ICD-10-CM | POA: Diagnosis not present

## 2011-09-28 DIAGNOSIS — M6281 Muscle weakness (generalized): Secondary | ICD-10-CM | POA: Diagnosis not present

## 2011-09-28 DIAGNOSIS — M545 Low back pain: Secondary | ICD-10-CM | POA: Diagnosis not present

## 2011-09-28 DIAGNOSIS — R262 Difficulty in walking, not elsewhere classified: Secondary | ICD-10-CM | POA: Diagnosis not present

## 2011-09-30 DIAGNOSIS — M545 Low back pain: Secondary | ICD-10-CM | POA: Diagnosis not present

## 2011-09-30 DIAGNOSIS — M6281 Muscle weakness (generalized): Secondary | ICD-10-CM | POA: Diagnosis not present

## 2011-09-30 DIAGNOSIS — R262 Difficulty in walking, not elsewhere classified: Secondary | ICD-10-CM | POA: Diagnosis not present

## 2011-10-01 DIAGNOSIS — R262 Difficulty in walking, not elsewhere classified: Secondary | ICD-10-CM | POA: Diagnosis not present

## 2011-10-01 DIAGNOSIS — M545 Low back pain: Secondary | ICD-10-CM | POA: Diagnosis not present

## 2011-10-01 DIAGNOSIS — M6281 Muscle weakness (generalized): Secondary | ICD-10-CM | POA: Diagnosis not present

## 2011-10-04 NOTE — Progress Notes (Signed)
  Subjective:    Patient ID: Joy [name], female    DOB: 12/14/1930, 78 y.o.   MRN: [id number]  HPI  Nurse visit for UA  Review of Systems     Objective:   Physical Exam        Assessment & Plan:

## 2011-10-05 DIAGNOSIS — M6281 Muscle weakness (generalized): Secondary | ICD-10-CM | POA: Diagnosis not present

## 2011-10-05 DIAGNOSIS — M545 Low back pain, unspecified: Secondary | ICD-10-CM | POA: Diagnosis not present

## 2011-10-05 DIAGNOSIS — M12379 Palindromic rheumatism, unspecified ankle and foot: Secondary | ICD-10-CM | POA: Diagnosis not present

## 2011-10-27 DIAGNOSIS — H4010X0 Unspecified open-angle glaucoma, stage unspecified: Secondary | ICD-10-CM | POA: Diagnosis not present

## 2011-11-01 ENCOUNTER — Other Ambulatory Visit: Payer: Self-pay | Admitting: Family Medicine

## 2011-11-05 DIAGNOSIS — R262 Difficulty in walking, not elsewhere classified: Secondary | ICD-10-CM | POA: Diagnosis not present

## 2011-11-05 DIAGNOSIS — M545 Low back pain, unspecified: Secondary | ICD-10-CM | POA: Diagnosis not present

## 2011-11-05 DIAGNOSIS — M6281 Muscle weakness (generalized): Secondary | ICD-10-CM | POA: Diagnosis not present

## 2011-11-11 ENCOUNTER — Encounter: Payer: Self-pay | Admitting: Family Medicine

## 2011-11-11 ENCOUNTER — Ambulatory Visit (INDEPENDENT_AMBULATORY_CARE_PROVIDER_SITE_OTHER): Payer: Medicare Other | Admitting: Family Medicine

## 2011-11-11 VITALS — BP 128/44 | HR 65 | Temp 99.1°F

## 2011-11-11 DIAGNOSIS — R05 Cough: Secondary | ICD-10-CM | POA: Diagnosis not present

## 2011-11-11 DIAGNOSIS — R059 Cough, unspecified: Secondary | ICD-10-CM

## 2011-11-11 MED ORDER — AZITHROMYCIN 250 MG PO TABS: ORAL_TABLET | ORAL | Status: DC

## 2011-11-11 MED ORDER — ALBUTEROL SULFATE HFA 108 (90 BASE) MCG/ACT IN AERS: 2.0000 | INHALATION_SPRAY | Freq: Four times a day (QID) | RESPIRATORY_TRACT | Status: DC | PRN

## 2011-11-11 NOTE — Patient Instructions (Addendum)
Good to see you.  Please take Mucinex twice daily for next several days.  Pro air (albuterol) inhaler as needed for wheezing and shortness of breath. Take Zpack as directed.  Call me with an update.

## 2011-11-11 NOTE — Progress Notes (Signed)
78 yo with h/o COPD and interstitial lung disease here for cough.    Past few days of increased cough and wheezing.  Sometimes forgets to use both her advair and spiriva.  Has not been using her rescue inhaler.  Cough has been productive and she feels feverish.   On oxygen continuously now per pulm.  No CP or SOB.   Patient Active Problem List  Diagnosis  . Bronchitis  . Thrush  . MELAS (mitochondrial encephalopathy, lactic acidosis and stroke-like episodes)  . HTN (hypertension)  . COPD (chronic obstructive pulmonary disease)  . Depression  . Thyroid disease  . Cough  . Shortness of breath  . Fatigue  . Neuropathy  . Insomnia  . Breast pain  . Respiratory failure, acute-on-chronic  . Left low back pain  . Headache  . Urinary frequency  . Low back pain   Past Medical History  Diagnosis Date  . HTN (hypertension)   . COPD (chronic obstructive pulmonary disease)   . Depression   . Thyroid disease   . HLD (hyperlipidemia)   . CAD (coronary artery disease)     h/o NSTEMI, LAD stent placement 10/04, cath 05/11/2010  . PMR (polymyalgia rheumatica)     with h/o steroid induced myopathy  . Anxiety   . Diastolic dysfunction     Echo 05/11/2010, EF 65-70%  . TIA (transient ischemic attack)     11/05 s/p L CEA   Past Surgical History  Procedure Date  . Cholecystectomy   . Carotid endarterectomy     left with stent placement 11/2003  . Iliac artery stent     right, 2001   History  Substance Use Topics  . Smoking status: Former Smoker    Quit date: 01/05/1996  . Smokeless tobacco: Never Used  . Alcohol Use: No   No family history on file. Allergies  Allergen Reactions  . Codeine Other (See Comments)    Coma for 2 days  . Prednisone Other (See Comments)    Weight gain,puffy face   Current Outpatient Prescriptions on File Prior to Visit  Medication Sig Dispense Refill  . ADVAIR DISKUS 250-50 MCG/DOSE AEPB INHALE 1 PUFF INTO THE LUNGS 2 (TWO) TIMES DAILY.  1 each  6   . aspirin EC 81 MG tablet Take 81 mg by mouth daily.        Marland Kitchen atorvastatin (LIPITOR) 40 MG tablet Take 1 tablet (40 mg total) by mouth daily.  90 tablet  3  . clopidogrel (PLAVIX) 75 MG tablet Take 1 tablet (75 mg total) by mouth daily.  90 tablet  3  . gabapentin (NEURONTIN) 800 MG tablet 1 capsule by mouth in AM and 2 capsules by mouth in PM. 1 capsule by mouth in AM and 2 capsules by mouth in PM.  180 tablet  3  . ketorolac (TORADOL) 30 MG/ML injection Inject 1 mL (30 mg total) into the muscle once.  1 mL  0  . Levothyroxine Sodium 25 MCG CAPS Take 1 capsule by mouth daily.        Marland Kitchen lidocaine (LINDAMANTLE) 3 % CREA cream Apply 1 application topically as needed.        Marland Kitchen lisinopril (PRINIVIL,ZESTRIL) 10 MG tablet Take 20 mg by mouth daily.        . metoprolol tartrate (LOPRESSOR) 25 MG tablet Take 0.5 tablets (12.5 mg total) by mouth daily.  90 tablet  3  . nitroGLYCERIN (NITROSTAT) 0.4 MG SL tablet Place 0.4 mg under the  tongue every 5 (five) minutes as needed.        . ranitidine (ZANTAC) 300 MG tablet TAKE 1 TABLET (300 MG TOTAL) BY MOUTH AT BEDTIME.  90 tablet  1  . sertraline (ZOLOFT) 50 MG tablet Take 1 tablet (50 mg total) by mouth daily.  90 tablet  3  . sodium polystyrene (KAYEXALATE) powder 15 grams by mouth x 1.  30 g  0  . SPIRIVA HANDIHALER 18 MCG inhalation capsule PLACE 1 CAPSULE (18 MCG TOTAL) INTO INHALER AND INHALE DAILY.  1 each  3  . traZODone (DESYREL) 50 MG tablet TAKE 0.5-1 TABLETS (25-50 MG TOTAL) BY MOUTH AT BEDTIME AS NEEDED FOR SLEEP.  30 tablet  3  . albuterol (PROVENTIL HFA;VENTOLIN HFA) 108 (90 BASE) MCG/ACT inhaler Inhale 2 puffs into the lungs every 6 (six) hours as needed for wheezing.  1 Inhaler  0   The PMH, PSH, Social History, Family History, Medications, and allergies have been reviewed in [facility], and have been updated if relevant.     ROS: See HPI No dysuria. No back pain.  Physical exam: BP 128/44  Pulse 65  Temp 99.1 F (37.3 C)  SpO2  96%  General:  Well-developed,well-nourished,in no acute distress; alert,appropriate and cooperative throughout examination Head:  normocephalic and atraumatic.   Eyes:  vision grossly intact, pupils equal, pupils round, and pupils reactive to light.   Ears:  R ear normal and L ear normal.   Nose:  no external deformity, pos O2 per [place] Mouth:  good dentition,  pos PND Lungs:  Normal respiratory effort, chest expands symmetrically.  Scattered exp wheezes, basilar rhonchi Heart:  Normal rate and regular rhythm. S1 and S2 normal without gallop, murmur, click, rub or other extra sounds. Skin:  Intact without suspicious lesions or rashes Psych:  Cognition and judgment appear intact. Alert and cooperative with normal attention span and concentration. No apparent delusions, illusions, hallucinations  Assessment and Plan: 1. COPD (chronic obstructive pulmonary disease)   Deteriorated with exacerbation. Cannot tolerate steroids.  Treat with Zpack, pro air inhaler rx also sent to her pharmacy.  Urged her to use her maintenance inhalers daily as prescribed. The patient indicates understanding of these issues and agrees with the plan.

## 2011-11-16 ENCOUNTER — Telehealth: Payer: Self-pay | Admitting: Family Medicine

## 2011-11-16 MED ORDER — AZITHROMYCIN 250 MG PO TABS: ORAL_TABLET | ORAL | Status: DC

## 2011-11-16 NOTE — Telephone Encounter (Signed)
Received an email from [place], Therapist, sports.  Pt still not feeling well although improved from when I initially saw her.  She evaluated her and still heard crackles on right.  Finished her zpack yesterday. )2 sats in 90s on her O2 and she is now using her inhalers properly.  Will extend course of abx.

## 2011-11-25 ENCOUNTER — Other Ambulatory Visit: Payer: Self-pay | Admitting: Family Medicine

## 2011-11-25 MED ORDER — GABAPENTIN 800 MG PO TABS: ORAL_TABLET | ORAL | Status: DC

## 2011-11-26 ENCOUNTER — Other Ambulatory Visit: Payer: Self-pay | Admitting: *Deleted

## 2011-11-26 MED ORDER — GABAPENTIN 800 MG PO TABS: ORAL_TABLET | ORAL | Status: DC

## 2011-11-26 NOTE — Telephone Encounter (Signed)
Phone request from patient's daughter, Ledell Noss

## 2011-12-06 ENCOUNTER — Other Ambulatory Visit: Payer: Self-pay | Admitting: Family Medicine

## 2011-12-10 ENCOUNTER — Telehealth: Payer: Self-pay | Admitting: Family Medicine

## 2011-12-10 MED ORDER — LISINOPRIL 10 MG PO TABS: 20.0000 mg | ORAL_TABLET | Freq: Every day | ORAL | Status: DC

## 2011-12-10 NOTE — Telephone Encounter (Signed)
Pt's daughter sent a text to my cell phone at 7:20 pm asking me to refill her lisinopril.  She is out of her medication. She was also questioning what dose she should be taking and I refilled and responded that we have her taking 20 mg daily.

## 2011-12-21 ENCOUNTER — Other Ambulatory Visit: Payer: Self-pay | Admitting: Family Medicine

## 2011-12-21 NOTE — Telephone Encounter (Signed)
Received refill request electronically. Last office visit 11/11/11/cough. Is it okay to refill medication?

## 2011-12-22 ENCOUNTER — Telehealth: Payer: Self-pay

## 2011-12-22 ENCOUNTER — Ambulatory Visit (INDEPENDENT_AMBULATORY_CARE_PROVIDER_SITE_OTHER): Payer: Medicare Other | Admitting: Family Medicine

## 2011-12-22 ENCOUNTER — Other Ambulatory Visit: Payer: Self-pay | Admitting: Family Medicine

## 2011-12-22 DIAGNOSIS — R300 Dysuria: Secondary | ICD-10-CM

## 2011-12-22 DIAGNOSIS — Z0289 Encounter for other administrative examinations: Secondary | ICD-10-CM

## 2011-12-22 LAB — POCT URINALYSIS DIPSTICK
Bilirubin, UA: NEGATIVE
Blood, UA: NEGATIVE
Glucose, UA: NEGATIVE
Ketones, UA: NEGATIVE
Leukocytes, UA: NEGATIVE
Nitrite, UA: NEGATIVE
Protein, UA: NEGATIVE
Spec Grav, UA: 1.015
Urobilinogen, UA: NEGATIVE
pH, UA: 5

## 2011-12-22 NOTE — Telephone Encounter (Signed)
Margaret at [facility] left v/m that pt is being admitted to [facility] this afternoon and request recent office notes, H&P and med list faxed to [phone] Karie Fetch.Please advise.

## 2011-12-22 NOTE — Telephone Encounter (Signed)
These have been sent and received at [facility], per [place].

## 2011-12-22 NOTE — Progress Notes (Signed)
Patient's CNA dropped of urine for UA because she had dysuria and two episodes of incontinence this week. UA neg.  Will send for cx. Pt's daughter, [name], informed.

## 2011-12-24 DIAGNOSIS — R5383 Other fatigue: Secondary | ICD-10-CM | POA: Diagnosis not present

## 2011-12-24 DIAGNOSIS — J449 Chronic obstructive pulmonary disease, unspecified: Secondary | ICD-10-CM | POA: Diagnosis not present

## 2011-12-24 DIAGNOSIS — I10 Essential (primary) hypertension: Secondary | ICD-10-CM

## 2011-12-24 DIAGNOSIS — R5381 Other malaise: Secondary | ICD-10-CM | POA: Diagnosis not present

## 2011-12-24 DIAGNOSIS — J441 Chronic obstructive pulmonary disease with (acute) exacerbation: Secondary | ICD-10-CM | POA: Diagnosis not present

## 2012-01-07 DIAGNOSIS — J441 Chronic obstructive pulmonary disease with (acute) exacerbation: Secondary | ICD-10-CM

## 2012-01-07 DIAGNOSIS — F329 Major depressive disorder, single episode, unspecified: Secondary | ICD-10-CM | POA: Diagnosis not present

## 2012-01-07 DIAGNOSIS — J449 Chronic obstructive pulmonary disease, unspecified: Secondary | ICD-10-CM

## 2012-01-07 DIAGNOSIS — E039 Hypothyroidism, unspecified: Secondary | ICD-10-CM

## 2012-01-07 DIAGNOSIS — I10 Essential (primary) hypertension: Secondary | ICD-10-CM | POA: Diagnosis not present

## 2012-01-17 DIAGNOSIS — M6281 Muscle weakness (generalized): Secondary | ICD-10-CM | POA: Diagnosis not present

## 2012-01-17 DIAGNOSIS — R350 Frequency of micturition: Secondary | ICD-10-CM | POA: Diagnosis not present

## 2012-01-17 DIAGNOSIS — R32 Unspecified urinary incontinence: Secondary | ICD-10-CM | POA: Diagnosis not present

## 2012-01-17 DIAGNOSIS — R262 Difficulty in walking, not elsewhere classified: Secondary | ICD-10-CM | POA: Diagnosis not present

## 2012-01-18 ENCOUNTER — Encounter: Payer: Self-pay | Admitting: Family Medicine

## 2012-01-18 ENCOUNTER — Ambulatory Visit (INDEPENDENT_AMBULATORY_CARE_PROVIDER_SITE_OTHER): Payer: Medicare Other | Admitting: Family Medicine

## 2012-01-18 VITALS — BP 150/80 | HR 84 | Temp 97.9°F | Wt 198.0 lb

## 2012-01-18 DIAGNOSIS — R05 Cough: Secondary | ICD-10-CM

## 2012-01-18 MED ORDER — AZITHROMYCIN 250 MG PO TABS: ORAL_TABLET | ORAL | Status: DC

## 2012-01-18 NOTE — Progress Notes (Signed)
79 yo with h/o COPD and interstitial lung disease here for cough.    Past week, since discharge from SNF, increased cough and wheezing.    Has not been using her rescue inhaler.  Cough has been productive and she has had chills.  No fevers. On oxygen continuously now per pulm.  No CP or SOB.   Patient Active Problem List  Diagnosis  . Bronchitis  . Thrush  . MELAS (mitochondrial encephalopathy, lactic acidosis and stroke-like episodes)  . HTN (hypertension)  . COPD (chronic obstructive pulmonary disease)  . Depression  . Thyroid disease  . Cough  . Shortness of breath  . Fatigue  . Neuropathy  . Insomnia  . Breast pain  . Respiratory failure, acute-on-chronic  . Left low back pain  . Headache  . Urinary frequency  . Low back pain   Past Medical History  Diagnosis Date  . HTN (hypertension)   . COPD (chronic obstructive pulmonary disease)   . Depression   . Thyroid disease   . HLD (hyperlipidemia)   . CAD (coronary artery disease)     h/o NSTEMI, LAD stent placement 10/04, cath 05/11/2010  . PMR (polymyalgia rheumatica)     with h/o steroid induced myopathy  . Anxiety   . Diastolic dysfunction     Echo 05/11/2010, EF 65-70%  . TIA (transient ischemic attack)     11/05 s/p L CEA   Past Surgical History  Procedure Date  . Cholecystectomy   . Carotid endarterectomy     left with stent placement 11/2003  . Iliac artery stent     right, 2001   History  Substance Use Topics  . Smoking status: Former Smoker    Quit date: 01/05/1996  . Smokeless tobacco: Never Used  . Alcohol Use: No   No family history on file. Allergies  Allergen Reactions  . Codeine Other (See Comments)    Coma for 2 days  . Prednisone Other (See Comments)    Weight gain,puffy face   Current Outpatient Prescriptions on File Prior to Visit  Medication Sig Dispense Refill  . ADVAIR DISKUS 250-50 MCG/DOSE AEPB INHALE 1 PUFF INTO THE LUNGS 2 (TWO) TIMES DAILY.  1 each  6  . albuterol  (PROVENTIL HFA;VENTOLIN HFA) 108 (90 BASE) MCG/ACT inhaler Inhale 2 puffs into the lungs every 6 (six) hours as needed for wheezing.  1 Inhaler  0  . aspirin EC 81 MG tablet Take 81 mg by mouth daily.        Marland Kitchen atorvastatin (LIPITOR) 40 MG tablet Take 1 tablet (40 mg total) by mouth daily.  90 tablet  3  . clopidogrel (PLAVIX) 75 MG tablet Take 1 tablet (75 mg total) by mouth daily.  90 tablet  3  . gabapentin (NEURONTIN) 800 MG tablet 1 capsule by mouth in AM and 2 capsules by mouth in PM. 1 capsule by mouth in AM and 2 capsules by mouth in PM.  180 tablet  0  . ketorolac (TORADOL) 30 MG/ML injection Inject 1 mL (30 mg total) into the muscle once.  1 mL  0  . levothyroxine (SYNTHROID, LEVOTHROID) 25 MCG tablet TAKE 1 TABLET BY MOUTH EVERY MORNING  90 tablet  1  . Levothyroxine Sodium 25 MCG CAPS Take 1 capsule by mouth daily.        Marland Kitchen lidocaine (LINDAMANTLE) 3 % CREA cream Apply 1 application topically as needed.        Marland Kitchen lisinopril (PRINIVIL,ZESTRIL) 10 MG tablet Take  2 tablets (20 mg total) by mouth daily.  60 tablet  3  . metoprolol tartrate (LOPRESSOR) 25 MG tablet Take 0.5 tablets (12.5 mg total) by mouth daily.  90 tablet  3  . nitroGLYCERIN (NITROSTAT) 0.4 MG SL tablet Place 0.4 mg under the tongue every 5 (five) minutes as needed.        . ranitidine (ZANTAC) 300 MG tablet TAKE 1 TABLET (300 MG TOTAL) BY MOUTH AT BEDTIME.  90 tablet  1  . sertraline (ZOLOFT) 50 MG tablet Take 1 tablet (50 mg total) by mouth daily.  90 tablet  3  . sodium polystyrene (KAYEXALATE) powder 15 grams by mouth x 1.  30 g  0  . SPIRIVA HANDIHALER 18 MCG inhalation capsule PLACE 1 CAPSULE (18 MCG TOTAL) INTO INHALER AND INHALE DAILY.  1 each  3  . traZODone (DESYREL) 50 MG tablet TAKE 0.5-1 TABLETS (25-50 MG TOTAL) BY MOUTH AT BEDTIME AS NEEDED FOR SLEEP.  30 tablet  3   The PMH, PSH, Social History, Family History, Medications, and allergies have been reviewed in [facility], and have been updated if  relevant.     ROS: See HPI No dysuria. No back pain.  Physical exam: BP 150/80  Pulse 84  Temp 97.9 F (36.6 C)  Wt 198 lb (89.812 kg)  General:  Well-developed,well-nourished,in no acute distress; alert,appropriate and cooperative throughout examination Head:  normocephalic and atraumatic.   Eyes:  vision grossly intact, pupils equal, pupils round, and pupils reactive to light.   Ears:  R ear normal and L ear normal.   Nose:  no external deformity, pos O2 per [place] Mouth:  good dentition,  pos PND Lungs:  Normal respiratory effort, chest expands symmetrically.  Scattered exp wheezes, basilar rhonchi Heart:  Normal rate and regular rhythm. S1 and S2 normal without gallop, murmur, click, rub or other extra sounds. Skin:  Intact without suspicious lesions or rashes Psych:  Cognition and judgment appear intact. Alert and cooperative with normal attention span and concentration. No apparent delusions, illusions, hallucinations  Assessment and Plan: 1. COPD (chronic obstructive pulmonary disease)   Deteriorated with exacerbation. Cannot tolerate steroids.  Treat with Zpack, pro air inhaler rx also sent to her pharmacy.  Urged her to use her maintenance inhalers daily as prescribed. The patient indicates understanding of these issues and agrees with the plan.

## 2012-01-18 NOTE — Patient Instructions (Addendum)
Take Zpack as directed. Take Mucinex 600 mg twice daily x 3 days.  Make sure you keep your appointment on 1/30 for 12 pm, Mr. Caswell's appointment is 12:30.

## 2012-01-19 DIAGNOSIS — R350 Frequency of micturition: Secondary | ICD-10-CM | POA: Diagnosis not present

## 2012-01-19 DIAGNOSIS — R262 Difficulty in walking, not elsewhere classified: Secondary | ICD-10-CM | POA: Diagnosis not present

## 2012-01-19 DIAGNOSIS — R32 Unspecified urinary incontinence: Secondary | ICD-10-CM | POA: Diagnosis not present

## 2012-01-19 DIAGNOSIS — M6281 Muscle weakness (generalized): Secondary | ICD-10-CM | POA: Diagnosis not present

## 2012-01-20 DIAGNOSIS — R350 Frequency of micturition: Secondary | ICD-10-CM | POA: Diagnosis not present

## 2012-01-20 DIAGNOSIS — R262 Difficulty in walking, not elsewhere classified: Secondary | ICD-10-CM | POA: Diagnosis not present

## 2012-01-20 DIAGNOSIS — M6281 Muscle weakness (generalized): Secondary | ICD-10-CM | POA: Diagnosis not present

## 2012-01-20 DIAGNOSIS — R32 Unspecified urinary incontinence: Secondary | ICD-10-CM | POA: Diagnosis not present

## 2012-01-21 DIAGNOSIS — R262 Difficulty in walking, not elsewhere classified: Secondary | ICD-10-CM | POA: Diagnosis not present

## 2012-01-21 DIAGNOSIS — R350 Frequency of micturition: Secondary | ICD-10-CM | POA: Diagnosis not present

## 2012-01-21 DIAGNOSIS — R32 Unspecified urinary incontinence: Secondary | ICD-10-CM | POA: Diagnosis not present

## 2012-01-21 DIAGNOSIS — M6281 Muscle weakness (generalized): Secondary | ICD-10-CM | POA: Diagnosis not present

## 2012-01-25 DIAGNOSIS — R262 Difficulty in walking, not elsewhere classified: Secondary | ICD-10-CM | POA: Diagnosis not present

## 2012-01-25 DIAGNOSIS — R32 Unspecified urinary incontinence: Secondary | ICD-10-CM | POA: Diagnosis not present

## 2012-01-25 DIAGNOSIS — R350 Frequency of micturition: Secondary | ICD-10-CM | POA: Diagnosis not present

## 2012-01-25 DIAGNOSIS — M6281 Muscle weakness (generalized): Secondary | ICD-10-CM | POA: Diagnosis not present

## 2012-01-27 DIAGNOSIS — M6281 Muscle weakness (generalized): Secondary | ICD-10-CM | POA: Diagnosis not present

## 2012-01-27 DIAGNOSIS — R350 Frequency of micturition: Secondary | ICD-10-CM | POA: Diagnosis not present

## 2012-01-27 DIAGNOSIS — R262 Difficulty in walking, not elsewhere classified: Secondary | ICD-10-CM | POA: Diagnosis not present

## 2012-01-27 DIAGNOSIS — R32 Unspecified urinary incontinence: Secondary | ICD-10-CM | POA: Diagnosis not present

## 2012-01-28 DIAGNOSIS — R350 Frequency of micturition: Secondary | ICD-10-CM | POA: Diagnosis not present

## 2012-01-28 DIAGNOSIS — M6281 Muscle weakness (generalized): Secondary | ICD-10-CM | POA: Diagnosis not present

## 2012-01-28 DIAGNOSIS — R32 Unspecified urinary incontinence: Secondary | ICD-10-CM | POA: Diagnosis not present

## 2012-01-28 DIAGNOSIS — R262 Difficulty in walking, not elsewhere classified: Secondary | ICD-10-CM | POA: Diagnosis not present

## 2012-01-30 ENCOUNTER — Other Ambulatory Visit: Payer: Self-pay | Admitting: Family Medicine

## 2012-01-31 ENCOUNTER — Other Ambulatory Visit: Payer: Self-pay | Admitting: Family Medicine

## 2012-01-31 DIAGNOSIS — R262 Difficulty in walking, not elsewhere classified: Secondary | ICD-10-CM | POA: Diagnosis not present

## 2012-01-31 DIAGNOSIS — M6281 Muscle weakness (generalized): Secondary | ICD-10-CM | POA: Diagnosis not present

## 2012-01-31 DIAGNOSIS — R32 Unspecified urinary incontinence: Secondary | ICD-10-CM | POA: Diagnosis not present

## 2012-01-31 DIAGNOSIS — R350 Frequency of micturition: Secondary | ICD-10-CM | POA: Diagnosis not present

## 2012-01-31 MED ORDER — SERTRALINE HCL 50 MG PO TABS: ORAL_TABLET | ORAL | Status: DC

## 2012-01-31 NOTE — Telephone Encounter (Signed)
Pt's daughter sent my a cell phone text asking for 5 tablets to be seen to her pharmacy.  She has follow up with me this week and thinks we may need to increase dose.

## 2012-02-02 DIAGNOSIS — R32 Unspecified urinary incontinence: Secondary | ICD-10-CM | POA: Diagnosis not present

## 2012-02-02 DIAGNOSIS — M6281 Muscle weakness (generalized): Secondary | ICD-10-CM | POA: Diagnosis not present

## 2012-02-02 DIAGNOSIS — R262 Difficulty in walking, not elsewhere classified: Secondary | ICD-10-CM | POA: Diagnosis not present

## 2012-02-02 DIAGNOSIS — R350 Frequency of micturition: Secondary | ICD-10-CM | POA: Diagnosis not present

## 2012-02-03 ENCOUNTER — Encounter: Payer: Self-pay | Admitting: Family Medicine

## 2012-02-03 ENCOUNTER — Ambulatory Visit (INDEPENDENT_AMBULATORY_CARE_PROVIDER_SITE_OTHER): Payer: Medicare Other | Admitting: Family Medicine

## 2012-02-03 VITALS — BP 130/52 | HR 66 | Temp 98.2°F | Wt 199.0 lb

## 2012-02-03 DIAGNOSIS — R32 Unspecified urinary incontinence: Secondary | ICD-10-CM | POA: Diagnosis not present

## 2012-02-03 DIAGNOSIS — R630 Anorexia: Secondary | ICD-10-CM | POA: Diagnosis not present

## 2012-02-03 DIAGNOSIS — I10 Essential (primary) hypertension: Secondary | ICD-10-CM | POA: Diagnosis not present

## 2012-02-03 DIAGNOSIS — J449 Chronic obstructive pulmonary disease, unspecified: Secondary | ICD-10-CM | POA: Diagnosis not present

## 2012-02-03 DIAGNOSIS — R262 Difficulty in walking, not elsewhere classified: Secondary | ICD-10-CM | POA: Diagnosis not present

## 2012-02-03 DIAGNOSIS — R350 Frequency of micturition: Secondary | ICD-10-CM | POA: Diagnosis not present

## 2012-02-03 DIAGNOSIS — G4700 Insomnia, unspecified: Secondary | ICD-10-CM | POA: Diagnosis not present

## 2012-02-03 DIAGNOSIS — F329 Major depressive disorder, single episode, unspecified: Secondary | ICD-10-CM

## 2012-02-03 DIAGNOSIS — M6281 Muscle weakness (generalized): Secondary | ICD-10-CM | POA: Diagnosis not present

## 2012-02-03 DIAGNOSIS — N760 Acute vaginitis: Secondary | ICD-10-CM

## 2012-02-03 LAB — COMPREHENSIVE METABOLIC PANEL
AST: 15 U/L (ref 0–37)
BUN: 19 mg/dL (ref 6–23)
CO2: 31 mEq/L (ref 19–32)
Calcium: 9.1 mg/dL (ref 8.4–10.5)
Chloride: 104 mEq/L (ref 96–112)
Creatinine, Ser: 1 mg/dL (ref 0.4–1.2)
GFR: 58.86 mL/min — ABNORMAL LOW (ref >60.00)
Total Bilirubin: 0.7 mg/dL (ref 0.3–1.2)

## 2012-02-03 LAB — CBC WITH DIFFERENTIAL/PLATELET
Basophils Relative: 0.3 % (ref 0.0–3.0)
Hemoglobin: 11.7 g/dL — ABNORMAL LOW (ref 12.0–15.0)
Lymphocytes Relative: 13.1 % (ref 12.0–46.0)
Monocytes Relative: 9.3 % (ref 3.0–12.0)
Neutro Abs: 7.5 10*3/uL (ref 1.4–7.7)
RBC: 4.12 Mil/uL (ref 3.87–5.11)

## 2012-02-03 MED ORDER — TERCONAZOLE 0.4 % VA CREA: 1.0000 | TOPICAL_CREAM | Freq: Every day | VAGINAL | Status: DC

## 2012-02-03 NOTE — Progress Notes (Signed)
79 yo with h/o COPD, depression, MELAS and interstitial lung disease here for follow up.  COPD- had an exacerbation this month and last month.  Spent short time at [facility] for rehab around Christmas for COPD exacerbation.  Admits to not wearing her O2 properly at that time but has not had any issues with it since. Most recently treated with Zpack earlier this month.  Feels her breathing has been good but has had a "scratchy throat" in the mornings.  Does admit to drinking orange juice in the evenings.  Takes Xantac nightly.   Depression- on Zoloft 50 mg daily.  Her daughter, [name], called me this week and let me know she thinks her depressive symptoms are worse because her appetite has been decreased. Ms. Montigny tells me today that she does not feel depressed.  She feels that "food does not taste as good," for past several weeks.  No abdominal pain.  Weight is stable. Wt Readings from Last 3 Encounters:  02/03/12 199 lb (90.266 kg)  01/18/12 198 lb (89.812 kg)  09/09/11 200 lb (90.719 kg)      Patient Active Problem List  Diagnosis  . MELAS (mitochondrial encephalopathy, lactic acidosis and stroke-like episodes)  . HTN (hypertension)  . COPD (chronic obstructive pulmonary disease)  . Depression  . Thyroid disease  . Neuropathy  . Insomnia   Past Medical History  Diagnosis Date  . HTN (hypertension)   . COPD (chronic obstructive pulmonary disease)   . Depression   . Thyroid disease   . HLD (hyperlipidemia)   . CAD (coronary artery disease)     h/o NSTEMI, LAD stent placement 10/04, cath 05/11/2010  . PMR (polymyalgia rheumatica)     with h/o steroid induced myopathy  . Anxiety   . Diastolic dysfunction     Echo 05/11/2010, EF 65-70%  . TIA (transient ischemic attack)     11/05 s/p L CEA   Past Surgical History  Procedure Date  . Cholecystectomy   . Carotid endarterectomy     left with stent placement 11/2003  . Iliac artery stent     right, 2001   History  Substance Use  Topics  . Smoking status: Former Smoker    Quit date: 01/05/1996  . Smokeless tobacco: Never Used  . Alcohol Use: No   No family history on file. Allergies  Allergen Reactions  . Codeine Other (See Comments)    Coma for 2 days  . Prednisone Other (See Comments)    Weight gain,puffy face   Current Outpatient Prescriptions on File Prior to Visit  Medication Sig Dispense Refill  . ADVAIR DISKUS 250-50 MCG/DOSE AEPB INHALE 1 PUFF INTO THE LUNGS 2 (TWO) TIMES DAILY.  1 each  6  . albuterol (PROVENTIL HFA;VENTOLIN HFA) 108 (90 BASE) MCG/ACT inhaler Inhale 2 puffs into the lungs every 6 (six) hours as needed for wheezing.  1 Inhaler  0  . aspirin EC 81 MG tablet Take 81 mg by mouth daily.        Marland Kitchen atorvastatin (LIPITOR) 40 MG tablet Take 1 tablet (40 mg total) by mouth daily.  90 tablet  3  . clopidogrel (PLAVIX) 75 MG tablet Take 1 tablet (75 mg total) by mouth daily.  90 tablet  3  . gabapentin (NEURONTIN) 800 MG tablet 1 capsule by mouth in AM and 2 capsules by mouth in PM. 1 capsule by mouth in AM and 2 capsules by mouth in PM.  180 tablet  0  . ketorolac (  TORADOL) 30 MG/ML injection Inject 1 mL (30 mg total) into the muscle once.  1 mL  0  . levothyroxine (SYNTHROID, LEVOTHROID) 25 MCG tablet TAKE 1 TABLET BY MOUTH EVERY MORNING  90 tablet  1  . Levothyroxine Sodium 25 MCG CAPS Take 1 capsule by mouth daily.        Marland Kitchen lidocaine (LINDAMANTLE) 3 % CREA cream Apply 1 application topically as needed.        Marland Kitchen lisinopril (PRINIVIL,ZESTRIL) 10 MG tablet Take 2 tablets (20 mg total) by mouth daily.  60 tablet  3  . metoprolol tartrate (LOPRESSOR) 25 MG tablet Take 0.5 tablets (12.5 mg total) by mouth daily.  90 tablet  3  . nitroGLYCERIN (NITROSTAT) 0.4 MG SL tablet Place 0.4 mg under the tongue every 5 (five) minutes as needed.        . ranitidine (ZANTAC) 300 MG tablet TAKE 1 TABLET (300 MG TOTAL) BY MOUTH AT BEDTIME.  90 tablet  1  . sertraline (ZOLOFT) 50 MG tablet TAKE 1 TABLET BY MOUTH  EVERY DAY  5 tablet  2  . sodium polystyrene (KAYEXALATE) powder 15 grams by mouth x 1.  30 g  0  . SPIRIVA HANDIHALER 18 MCG inhalation capsule PLACE 1 CAPSULE (18 MCG TOTAL) INTO INHALER AND INHALE DAILY.  1 each  3  . traZODone (DESYREL) 50 MG tablet TAKE 0.5-1 TABLETS (25-50 MG TOTAL) BY MOUTH AT BEDTIME AS NEEDED FOR SLEEP.  30 tablet  3   The PMH, PSH, Social History, Family History, Medications, and allergies have been reviewed in [facility], and have been updated if relevant.     ROS: See HPI No dysuria. No back pain.  Physical exam: BP 130/52  Pulse 66  Temp 98.2 F (36.8 C)  Wt 199 lb (90.266 kg)  SpO2 96% Wt Readings from Last 3 Encounters:  02/03/12 199 lb (90.266 kg)  01/18/12 198 lb (89.812 kg)  09/09/11 200 lb (90.719 kg)    General:  Well-developed,well-nourished,in no acute distress; alert,appropriate and cooperative throughout examination Head:  normocephalic and atraumatic.   Eyes:  vision grossly intact, pupils equal, pupils round, and pupils reactive to light.   Ears:  R ear normal and L ear normal.   Nose:  no external deformity, pos O2 per [place] Mouth:  good dentition Lungs:  Normal respiratory effort, chest expands symmetrically.  CTA bilaterally Heart:  Normal rate and regular rhythm. S1 and S2 normal without gallop, murmur, click, rub or other extra sounds. Skin:  Intact without suspicious lesions or rashes Psych:  Cognition and judgment appear intact. Alert and cooperative with normal attention span and concentration. No apparent delusions, illusions, hallucinations  Assessment and Plan: 1. COPD (chronic obstructive pulmonary disease)  Well controlled. On spiriva, Advair. Prn albuterol.   2. Depression  Ms Manibusan feels her symptoms are well controlled on her current dose of Zoloft.   3. HTN (hypertension)  Well controlled. On lisinopril.   4. Insomnia  Sleeping better on Trazodone.   5. Decreased appetite  New- I suspect may be related to her  worsening GERD.  Discussed cutting back on orange juice and other acid producing foods.  Add prilosec 20 mg daily x 2 weeks. Check labs today to rule out other contributing factors. Comprehensive metabolic panel, CBC with Differential, TSH, T4, Free  6. Vaginitis  Terazol vaginal suppositories.

## 2012-02-03 NOTE — Patient Instructions (Addendum)
Use Terazol for 3-7 days for yeast infection.  Please add prilosec 20 mg daily (over the counter) for 2 weeks.  Call me with an update after 2 weeks or have [name] text me.  We are checking some blood work today and will call you with the results.

## 2012-02-04 DIAGNOSIS — R350 Frequency of micturition: Secondary | ICD-10-CM | POA: Diagnosis not present

## 2012-02-04 DIAGNOSIS — R262 Difficulty in walking, not elsewhere classified: Secondary | ICD-10-CM | POA: Diagnosis not present

## 2012-02-04 DIAGNOSIS — M6281 Muscle weakness (generalized): Secondary | ICD-10-CM | POA: Diagnosis not present

## 2012-02-04 DIAGNOSIS — R32 Unspecified urinary incontinence: Secondary | ICD-10-CM | POA: Diagnosis not present

## 2012-02-09 ENCOUNTER — Other Ambulatory Visit: Payer: Self-pay | Admitting: Family Medicine

## 2012-02-09 DIAGNOSIS — M6281 Muscle weakness (generalized): Secondary | ICD-10-CM | POA: Diagnosis not present

## 2012-02-09 DIAGNOSIS — R262 Difficulty in walking, not elsewhere classified: Secondary | ICD-10-CM | POA: Diagnosis not present

## 2012-02-10 DIAGNOSIS — M6281 Muscle weakness (generalized): Secondary | ICD-10-CM | POA: Diagnosis not present

## 2012-02-10 DIAGNOSIS — R262 Difficulty in walking, not elsewhere classified: Secondary | ICD-10-CM | POA: Diagnosis not present

## 2012-02-14 ENCOUNTER — Other Ambulatory Visit: Payer: Self-pay | Admitting: Family Medicine

## 2012-02-15 DIAGNOSIS — M6281 Muscle weakness (generalized): Secondary | ICD-10-CM | POA: Diagnosis not present

## 2012-02-15 DIAGNOSIS — R262 Difficulty in walking, not elsewhere classified: Secondary | ICD-10-CM | POA: Diagnosis not present

## 2012-02-18 DIAGNOSIS — M6281 Muscle weakness (generalized): Secondary | ICD-10-CM | POA: Diagnosis not present

## 2012-02-18 DIAGNOSIS — R262 Difficulty in walking, not elsewhere classified: Secondary | ICD-10-CM | POA: Diagnosis not present

## 2012-02-19 ENCOUNTER — Other Ambulatory Visit: Payer: Self-pay

## 2012-02-22 ENCOUNTER — Telehealth: Payer: Self-pay | Admitting: Family Medicine

## 2012-02-22 ENCOUNTER — Telehealth (INDEPENDENT_AMBULATORY_CARE_PROVIDER_SITE_OTHER): Payer: Medicare Other | Admitting: Family Medicine

## 2012-02-22 DIAGNOSIS — R262 Difficulty in walking, not elsewhere classified: Secondary | ICD-10-CM | POA: Diagnosis not present

## 2012-02-22 DIAGNOSIS — M6281 Muscle weakness (generalized): Secondary | ICD-10-CM | POA: Diagnosis not present

## 2012-02-22 DIAGNOSIS — R300 Dysuria: Secondary | ICD-10-CM

## 2012-02-22 LAB — POCT URINALYSIS DIPSTICK
Ketones, UA: NEGATIVE
Leukocytes, UA: NEGATIVE
Nitrite, UA: NEGATIVE
Protein, UA: NEGATIVE
Urobilinogen, UA: NEGATIVE
pH, UA: 6

## 2012-02-22 NOTE — Telephone Encounter (Signed)
Received an email from Leticia Penna, Therapist, sports at [facility] asking for UA for Joy [name].  Foul smelling urine, blood in her diaper and AMS.  UA positive for LE and blood but she has had a yeast infection.  No Nitrites.  Leafy Ro is going to bring the urine sample to our office to send for urine cx.  Order entered.

## 2012-02-22 NOTE — Telephone Encounter (Signed)
Urine sample

## 2012-02-24 DIAGNOSIS — R262 Difficulty in walking, not elsewhere classified: Secondary | ICD-10-CM | POA: Diagnosis not present

## 2012-02-24 DIAGNOSIS — M6281 Muscle weakness (generalized): Secondary | ICD-10-CM | POA: Diagnosis not present

## 2012-02-24 LAB — URINE CULTURE: Colony Count: >=100000

## 2012-02-25 ENCOUNTER — Other Ambulatory Visit: Payer: Self-pay | Admitting: *Deleted

## 2012-02-25 DIAGNOSIS — M6281 Muscle weakness (generalized): Secondary | ICD-10-CM | POA: Diagnosis not present

## 2012-02-25 MED ORDER — CIPROFLOXACIN HCL 250 MG PO TABS: ORAL_TABLET | ORAL | Status: DC

## 2012-02-25 MED ORDER — FLUCONAZOLE 150 MG PO TABS: 150.0000 mg | ORAL_TABLET | Freq: Once | ORAL | Status: DC

## 2012-02-28 DIAGNOSIS — M6281 Muscle weakness (generalized): Secondary | ICD-10-CM | POA: Diagnosis not present

## 2012-02-28 DIAGNOSIS — R262 Difficulty in walking, not elsewhere classified: Secondary | ICD-10-CM | POA: Diagnosis not present

## 2012-02-29 ENCOUNTER — Ambulatory Visit (INDEPENDENT_AMBULATORY_CARE_PROVIDER_SITE_OTHER): Payer: Medicare Other | Admitting: Family Medicine

## 2012-02-29 ENCOUNTER — Ambulatory Visit (INDEPENDENT_AMBULATORY_CARE_PROVIDER_SITE_OTHER)
Admission: RE | Admit: 2012-02-29 | Discharge: 2012-02-29 | Disposition: A | Discharge status: Home / Self Care | Payer: Medicare Other | Source: Ambulatory Visit | Attending: Family Medicine | Admitting: Family Medicine

## 2012-02-29 ENCOUNTER — Encounter: Payer: Self-pay | Admitting: Family Medicine

## 2012-02-29 VITALS — BP 120/50 | HR 73 | Temp 97.5°F | Wt 200.0 lb

## 2012-02-29 DIAGNOSIS — M533 Sacrococcygeal disorders, not elsewhere classified: Secondary | ICD-10-CM | POA: Diagnosis not present

## 2012-02-29 DIAGNOSIS — IMO0002 Reserved for concepts with insufficient information to code with codable children: Secondary | ICD-10-CM | POA: Diagnosis not present

## 2012-02-29 MED ORDER — ATORVASTATIN CALCIUM 40 MG PO TABS: ORAL_TABLET | ORAL | Status: DC

## 2012-02-29 NOTE — Patient Instructions (Addendum)
Let's go ahead and get an xray today. You probably just bruised your tail bone but we will rule out any fractures.  We will call you with your results.

## 2012-02-29 NOTE — Progress Notes (Signed)
79 yo with h/o COPD, depression, MELAS and interstitial lung disease here for:   Back pain- fell two weeks ago onto her tail bone. She cannot remember exactly how she fell but she landed directly on her tail bone.  Since then tail bone is very tender to touch.  Hurts to sit and stand from a seated position.  No radiculopathy or weakness.  Does not remember and dizziness prior to falling.   Patient Active Problem List  Diagnosis  . MELAS (mitochondrial encephalopathy, lactic acidosis and stroke-like episodes)  . HTN (hypertension)  . COPD (chronic obstructive pulmonary disease)  . Depression  . Thyroid disease  . Neuropathy  . Insomnia  . Vaginitis   Past Medical History  Diagnosis Date  . HTN (hypertension)   . COPD (chronic obstructive pulmonary disease)   . Depression   . Thyroid disease   . HLD (hyperlipidemia)   . CAD (coronary artery disease)     h/o NSTEMI, LAD stent placement 10/04, cath 05/11/2010  . PMR (polymyalgia rheumatica)     with h/o steroid induced myopathy  . Anxiety   . Diastolic dysfunction     Echo 05/11/2010, EF 65-70%  . TIA (transient ischemic attack)     11/05 s/p L CEA   Past Surgical History  Procedure Laterality Date  . Cholecystectomy    . Carotid endarterectomy      left with stent placement 11/2003  . Iliac artery stent      right, 2001   History  Substance Use Topics  . Smoking status: Former Smoker    Quit date: 01/05/1996  . Smokeless tobacco: Never Used  . Alcohol Use: No   No family history on file. Allergies  Allergen Reactions  . Codeine Other (See Comments)    Coma for 2 days  . Prednisone Other (See Comments)    Weight gain,puffy face   Current Outpatient Prescriptions on File Prior to Visit  Medication Sig Dispense Refill  . ADVAIR DISKUS 250-50 MCG/DOSE AEPB INHALE 1 PUFF INTO THE LUNGS 2 (TWO) TIMES DAILY.  1 each  6  . albuterol (PROVENTIL HFA;VENTOLIN HFA) 108 (90 BASE) MCG/ACT inhaler Inhale 2 puffs into the  lungs every 6 (six) hours as needed for wheezing.  1 Inhaler  0  . aspirin EC 81 MG tablet Take 81 mg by mouth daily.        Marland Kitchen atorvastatin (LIPITOR) 40 MG tablet TAKE 1 TABLET BY MOUTH EVERY DAY  30 tablet  5  . ciprofloxacin (CIPRO) 250 MG tablet Take one tablet by mouth twice a day x 5 days.  10 tablet  0  . clopidogrel (PLAVIX) 75 MG tablet TAKE 1 TABLET DAILY  90 tablet  1  . fluconazole (DIFLUCAN) 150 MG tablet Take 1 tablet (150 mg total) by mouth once.  1 tablet  1  . gabapentin (NEURONTIN) 800 MG tablet 1 capsule by mouth in AM and 2 capsules by mouth in PM. 1 capsule by mouth in AM and 2 capsules by mouth in PM.  180 tablet  0  . ketorolac (TORADOL) 30 MG/ML injection Inject 1 mL (30 mg total) into the muscle once.  1 mL  0  . levothyroxine (SYNTHROID, LEVOTHROID) 25 MCG tablet TAKE 1 TABLET BY MOUTH EVERY MORNING  90 tablet  1  . Levothyroxine Sodium 25 MCG CAPS Take 1 capsule by mouth daily.        Marland Kitchen lidocaine (LINDAMANTLE) 3 % CREA cream Apply 1 application topically as  needed.        Marland Kitchen lisinopril (PRINIVIL,ZESTRIL) 10 MG tablet Take 2 tablets (20 mg total) by mouth daily.  60 tablet  3  . metoprolol tartrate (LOPRESSOR) 25 MG tablet TAKE 1/2 TABLET (=12.5 MG) DAILY  45 tablet  1  . nitroGLYCERIN (NITROSTAT) 0.4 MG SL tablet Place 0.4 mg under the tongue every 5 (five) minutes as needed.        . ranitidine (ZANTAC) 300 MG tablet TAKE 1 TABLET (300 MG TOTAL) BY MOUTH AT BEDTIME.  90 tablet  1  . sertraline (ZOLOFT) 50 MG tablet TAKE 1 TABLET BY MOUTH EVERY DAY  5 tablet  2  . sodium polystyrene (KAYEXALATE) powder 15 grams by mouth x 1.  30 g  0  . SPIRIVA HANDIHALER 18 MCG inhalation capsule PLACE 1 CAPSULE (18 MCG TOTAL) INTO INHALER AND INHALE DAILY.  1 each  3  . terconazole (TERAZOL 7) 0.4 % vaginal cream Place 1 applicator vaginally at bedtime.  45 g  0  . traZODone (DESYREL) 50 MG tablet TAKE 0.5-1 TABLETS (25-50 MG TOTAL) BY MOUTH AT BEDTIME AS NEEDED FOR SLEEP.  30 tablet  3    No current facility-administered medications on file prior to visit.   The PMH, PSH, Social History, Family History, Medications, and allergies have been reviewed in [facility], and have been updated if relevant.     ROS: See HPI No dysuria.  Physical exam: BP 120/50  Pulse 73  Temp(Src) 97.5 F (36.4 C)  Wt 200 lb (90.719 kg)  BMI 34.87 kg/m2  SpO2 96%   General:  Well-developed,well-nourished,in no acute distress; alert,appropriate and cooperative throughout examination Head:  normocephalic and atraumatic.   Eyes:  vision grossly intact, pupils equal, pupils round, and pupils reactive to light.   Ears:  R ear normal and L ear normal.   Nose:  no external deformity, pos O2 per [place] Mouth:  good dentition Lungs:  Normal respiratory effort, chest expands symmetrically.  Heart:  Normal rate and regular rhythm. S1 and S2 normal without gallop, murmur, click, rub or other extra sounds. Skin:  Intact without suspicious lesions or rashes Psych:  Cognition and judgment appear intact. Alert and cooperative with normal attention span and concentration. No apparent delusions, illusions, hallucinations MSK:  TTP over coccyx, FROM hips bilaterally, no pain with external or internal rotation.  Assessment and Plan:   1. Pain, coccyx Likely due to bruised coccyx but will get xray today to rule out fracture. Discussed supportive care with pt and caregiver.  - DG Sacrum/Coccyx; Future

## 2012-02-29 NOTE — Addendum Note (Signed)
Addended by: Ricki [name] on: 02/29/2012 11:16 AM   Modules accepted: Orders

## 2012-03-01 DIAGNOSIS — R262 Difficulty in walking, not elsewhere classified: Secondary | ICD-10-CM | POA: Diagnosis not present

## 2012-03-01 DIAGNOSIS — M6281 Muscle weakness (generalized): Secondary | ICD-10-CM | POA: Diagnosis not present

## 2012-03-06 ENCOUNTER — Telehealth: Payer: Self-pay | Admitting: *Deleted

## 2012-03-06 NOTE — Telephone Encounter (Signed)
Pt's long term care insurance company mailed form- attending physician's statement- for dr to complete and sign.  Form completed by Dr. Deborra Medina, mailed back to insurance company.  Copy sent for scanning.

## 2012-03-07 DIAGNOSIS — R262 Difficulty in walking, not elsewhere classified: Secondary | ICD-10-CM | POA: Diagnosis not present

## 2012-03-07 DIAGNOSIS — M6281 Muscle weakness (generalized): Secondary | ICD-10-CM | POA: Diagnosis not present

## 2012-03-08 DIAGNOSIS — R262 Difficulty in walking, not elsewhere classified: Secondary | ICD-10-CM | POA: Diagnosis not present

## 2012-03-08 DIAGNOSIS — M6281 Muscle weakness (generalized): Secondary | ICD-10-CM | POA: Diagnosis not present

## 2012-03-09 DIAGNOSIS — R262 Difficulty in walking, not elsewhere classified: Secondary | ICD-10-CM | POA: Diagnosis not present

## 2012-03-09 DIAGNOSIS — M6281 Muscle weakness (generalized): Secondary | ICD-10-CM | POA: Diagnosis not present

## 2012-03-19 ENCOUNTER — Other Ambulatory Visit: Payer: Self-pay | Admitting: Family Medicine

## 2012-03-24 ENCOUNTER — Other Ambulatory Visit: Payer: Self-pay | Admitting: Family Medicine

## 2012-03-25 ENCOUNTER — Other Ambulatory Visit: Payer: Self-pay | Admitting: Family Medicine

## 2012-03-27 ENCOUNTER — Ambulatory Visit (INDEPENDENT_AMBULATORY_CARE_PROVIDER_SITE_OTHER): Payer: 59 | Admitting: Family Medicine

## 2012-03-27 DIAGNOSIS — R300 Dysuria: Secondary | ICD-10-CM | POA: Diagnosis not present

## 2012-03-29 ENCOUNTER — Other Ambulatory Visit: Payer: Self-pay | Admitting: Family Medicine

## 2012-03-29 LAB — URINE CULTURE

## 2012-03-29 MED ORDER — CIPROFLOXACIN HCL 250 MG PO TABS: ORAL_TABLET | ORAL | Status: DC

## 2012-03-30 ENCOUNTER — Encounter: Payer: Self-pay | Admitting: Family Medicine

## 2012-03-30 ENCOUNTER — Ambulatory Visit (INDEPENDENT_AMBULATORY_CARE_PROVIDER_SITE_OTHER): Payer: 59 | Admitting: Family Medicine

## 2012-03-30 VITALS — BP 150/52 | HR 73 | Temp 98.3°F | Wt 205.0 lb

## 2012-03-30 DIAGNOSIS — M545 Low back pain: Secondary | ICD-10-CM

## 2012-03-30 NOTE — Patient Instructions (Addendum)
It was good to see you. Take cipro as directed. Use a heating pad. Please call me on Monday with an update.

## 2012-03-30 NOTE — Progress Notes (Signed)
79 yo with h/o COPD, depression, MELAS and interstitial lung disease here for:   Back pain- fell last Thursday.  Lost her footing and fell back on a box of kleenex.  Since then, she feels like her lower back is spasming.  No radiculopathy.   No weakness.  Does not remember and dizziness prior to falling.  No dysuria- we gave rx for cipro as her urine was not a clean catch.  She has not yet started this.   Patient Active Problem List  Diagnosis  . MELAS (mitochondrial encephalopathy, lactic acidosis and stroke-like episodes)  . HTN (hypertension)  . COPD (chronic obstructive pulmonary disease)  . Depression  . Thyroid disease  . Neuropathy  . Insomnia  . Pain, coccyx  . Low back pain   Past Medical History  Diagnosis Date  . HTN (hypertension)   . COPD (chronic obstructive pulmonary disease)   . Depression   . Thyroid disease   . HLD (hyperlipidemia)   . CAD (coronary artery disease)     h/o NSTEMI, LAD stent placement 10/04, cath 05/11/2010  . PMR (polymyalgia rheumatica)     with h/o steroid induced myopathy  . Anxiety   . Diastolic dysfunction     Echo 05/11/2010, EF 65-70%  . TIA (transient ischemic attack)     11/05 s/p L CEA   Past Surgical History  Procedure Laterality Date  . Cholecystectomy    . Carotid endarterectomy      left with stent placement 11/2003  . Iliac artery stent      right, 2001   History  Substance Use Topics  . Smoking status: Former Smoker    Quit date: 01/05/1996  . Smokeless tobacco: Never Used  . Alcohol Use: No   No family history on file. Allergies  Allergen Reactions  . Codeine Other (See Comments)    Coma for 2 days  . Prednisone Other (See Comments)    Weight gain,puffy face   Current Outpatient Prescriptions on File Prior to Visit  Medication Sig Dispense Refill  . ADVAIR DISKUS 250-50 MCG/DOSE AEPB INHALE 1 PUFF INTO THE LUNGS 2 (TWO) TIMES DAILY.  1 each  6  . albuterol (PROVENTIL HFA;VENTOLIN HFA) 108 (90 BASE)  MCG/ACT inhaler Inhale 2 puffs into the lungs every 6 (six) hours as needed for wheezing.  1 Inhaler  0  . aspirin EC 81 MG tablet Take 81 mg by mouth daily.        Marland Kitchen atorvastatin (LIPITOR) 40 MG tablet TAKE 1 TABLET BY MOUTH EVERY DAY  90 tablet  1  . ciprofloxacin (CIPRO) 250 MG tablet Take one tablet by mouth twice a day x 5 days.  10 tablet  0  . clopidogrel (PLAVIX) 75 MG tablet TAKE 1 TABLET DAILY  90 tablet  1  . fluconazole (DIFLUCAN) 150 MG tablet Take 1 tablet (150 mg total) by mouth once.  1 tablet  1  . gabapentin (NEURONTIN) 800 MG tablet 1 capsule by mouth in AM and 2 capsules by mouth in PM. 1 capsule by mouth in AM and 2 capsules by mouth in PM.  180 tablet  0  . levothyroxine (SYNTHROID, LEVOTHROID) 25 MCG tablet TAKE 1 TABLET BY MOUTH EVERY MORNING  90 tablet  1  . Levothyroxine Sodium 25 MCG CAPS Take 1 capsule by mouth daily.        Marland Kitchen lidocaine (LINDAMANTLE) 3 % CREA cream Apply 1 application topically as needed.        Marland Kitchen  lisinopril (PRINIVIL,ZESTRIL) 10 MG tablet Take 2 tablets (20 mg total) by mouth daily.  60 tablet  3  . metoprolol tartrate (LOPRESSOR) 25 MG tablet TAKE 1/2 TABLET (=12.5 MG) DAILY  45 tablet  1  . nitroGLYCERIN (NITROSTAT) 0.4 MG SL tablet Place 0.4 mg under the tongue every 5 (five) minutes as needed.        . ranitidine (ZANTAC) 300 MG tablet TAKE 1 TABLET (300 MG TOTAL) BY MOUTH AT BEDTIME.  90 tablet  1  . sertraline (ZOLOFT) 50 MG tablet TAKE 1 TABLET BY MOUTH EVERY DAY  5 tablet  2  . SPIRIVA HANDIHALER 18 MCG inhalation capsule PLACE 1 CAPSULE (18 MCG TOTAL) INTO INHALER AND INHALE DAILY.  1 each  3  . SPIRIVA HANDIHALER 18 MCG inhalation capsule PLACE 1 CAPSULE (18 MCG TOTAL) INTO INHALER AND INHALE DAILY.  30 capsule  5  . terconazole (TERAZOL 7) 0.4 % vaginal cream Place 1 applicator vaginally at bedtime.  45 g  0  . traZODone (DESYREL) 50 MG tablet TAKE 0.5-1 TABLETS (25-50 MG TOTAL) BY MOUTH AT BEDTIME AS NEEDED FOR SLEEP.  30 tablet  3   No  current facility-administered medications on file prior to visit.   The PMH, PSH, Social History, Family History, Medications, and allergies have been reviewed in [facility], and have been updated if relevant.     ROS: See HPI No dysuria.  Physical exam: BP 150/52  Pulse 73  Temp(Src) 98.3 F (36.8 C)  Wt 205 lb (92.987 kg)  BMI 35.74 kg/m2  SpO2 94%   General:  Well-developed,well-nourished,in no acute distress; alert,appropriate and cooperative throughout examination Head:  normocephalic and atraumatic.   Eyes:  vision grossly intact, pupils equal, pupils round, and pupils reactive to light.   Ears:  R ear normal and L ear normal.   Nose:  no external deformity, pos O2 per [place]outh:  good dentition Lungs:  Normal respiratory effort, chest expands symmetrically.  Heart:  Normal rate and regular rhythm. S1 and S2 normal without gallop, murmur, click, rub or other extra sounds. Skin:  Intact without suspicious lesions or rashes Psych:  Cognition and judgment appear intact. Alert and cooperative with normal attention span and concentration. No apparent delusions, illusions, hallucinations MSK:  No TTP over spine, neg straight leg raise bilaterally.  Assessment and Plan:   1. Low back pain I suspect back spasms/muscle soreness. Exam unremarkable.  I would prefer not to use muscle relaxants given she is already a high fall risk.  Advised supportive care with heating pads, NSAIDS. Call or return to clinic prn if these symptoms worsen or fail to improve as anticipated. The patient indicates understanding of these issues and agrees with the plan.

## 2012-04-26 DIAGNOSIS — H4010X0 Unspecified open-angle glaucoma, stage unspecified: Secondary | ICD-10-CM | POA: Diagnosis not present

## 2012-05-02 ENCOUNTER — Other Ambulatory Visit: Payer: Self-pay | Admitting: Family Medicine

## 2012-05-19 ENCOUNTER — Telehealth: Payer: Self-pay | Admitting: Family Medicine

## 2012-05-19 MED ORDER — SERTRALINE HCL 100 MG PO TABS: ORAL_TABLET | ORAL | Status: DC

## 2012-05-19 NOTE — Telephone Encounter (Signed)
Received call from pt's daughter, Rollene Fare, stating that pt has been "more down lately."  She is sleeping more then usual and does not want to leave the house as frequently.  Denies SI or HI.  She asks if we can increase her zoloft dose.  I stated that we could try this- we need to use caution in elderly.  Follow up in 3-4 weeks. The patient's daughter indicates understanding of these issues and agrees with the plan.

## 2012-05-22 ENCOUNTER — Ambulatory Visit (INDEPENDENT_AMBULATORY_CARE_PROVIDER_SITE_OTHER): Payer: 59 | Admitting: *Deleted

## 2012-05-22 DIAGNOSIS — R300 Dysuria: Secondary | ICD-10-CM

## 2012-05-22 LAB — POCT URINALYSIS DIPSTICK
Blood, UA: NEGATIVE
Glucose, UA: NEGATIVE
Ketones, UA: NEGATIVE
Leukocytes, UA: NEGATIVE
Nitrite, UA: NEGATIVE
Spec Grav, UA: 1.015

## 2012-05-23 ENCOUNTER — Other Ambulatory Visit: Payer: Self-pay | Admitting: Family Medicine

## 2012-05-23 DIAGNOSIS — F329 Major depressive disorder, single episode, unspecified: Secondary | ICD-10-CM

## 2012-05-25 ENCOUNTER — Ambulatory Visit (INDEPENDENT_AMBULATORY_CARE_PROVIDER_SITE_OTHER): Payer: Medicare Other | Admitting: Family Medicine

## 2012-05-25 ENCOUNTER — Encounter: Payer: Self-pay | Admitting: Family Medicine

## 2012-05-25 VITALS — BP 98/44 | HR 72 | Temp 98.9°F | Wt 203.0 lb

## 2012-05-25 DIAGNOSIS — I10 Essential (primary) hypertension: Secondary | ICD-10-CM

## 2012-05-25 DIAGNOSIS — R269 Unspecified abnormalities of gait and mobility: Secondary | ICD-10-CM | POA: Diagnosis not present

## 2012-05-25 DIAGNOSIS — E079 Disorder of thyroid, unspecified: Secondary | ICD-10-CM

## 2012-05-25 DIAGNOSIS — F329 Major depressive disorder, single episode, unspecified: Secondary | ICD-10-CM | POA: Diagnosis not present

## 2012-05-25 DIAGNOSIS — R42 Dizziness and giddiness: Secondary | ICD-10-CM | POA: Diagnosis not present

## 2012-05-25 DIAGNOSIS — E039 Hypothyroidism, unspecified: Secondary | ICD-10-CM

## 2012-05-25 LAB — COMPREHENSIVE METABOLIC PANEL
ALT: 9 U/L (ref 0–35)
AST: 15 U/L (ref 0–37)
Creatinine, Ser: 1.2 mg/dL (ref 0.4–1.2)
Sodium: 143 mEq/L (ref 135–145)
Total Bilirubin: 0.5 mg/dL (ref 0.3–1.2)

## 2012-05-25 LAB — CBC WITH DIFFERENTIAL/PLATELET
Basophils Relative: 0.3 % (ref 0.0–3.0)
Eosinophils Relative: 2.1 % (ref 0.0–5.0)
HCT: 36.2 % (ref 36.0–46.0)
Hemoglobin: 12 g/dL (ref 12.0–15.0)
Lymphs Abs: 1.2 10*3/uL (ref 0.7–4.0)
MCV: 85 fl (ref 78.0–100.0)
Monocytes Absolute: 1 10*3/uL (ref 0.1–1.0)
Neutro Abs: 8.1 10*3/uL — ABNORMAL HIGH (ref 1.4–7.7)
Neutrophils Relative %: 77.4 % — ABNORMAL HIGH (ref 43.0–77.0)
RBC: 4.26 Mil/uL (ref 3.87–5.11)
WBC: 10.5 10*3/uL (ref 4.5–10.5)

## 2012-05-25 NOTE — Progress Notes (Signed)
79 yo with h/o COPD, depression, MELAS and interstitial lung disease here for:   Feeling off balance.-  Daughter, Joy [name], called me due to her worsening depression.  We increased her Zoloft to 50 mg from 100 mg daily.  Per her daughter, this started prior to increasing this dose. Started on Sunday- was walking and her body kept pulling her to the left- she could not walk in a straight line.  Symptoms persisted until Tuesday.  No nausea, no dizziness, no urinary symptoms, no HA.  Never had anything like this before.   UA neg this week.  Mandy, RN, has been checking CBGs which have all been normal.  Lab Results  Component Value Date   WBC 10.0 02/03/2012   HGB 11.7* 02/03/2012   HCT 35.7* 02/03/2012   MCV 86.7 02/03/2012   PLT 306.0 02/03/2012   Lab Results  Component Value Date   TSH 1.47 02/03/2012    Joy [name] also reports that she recently found out that Joy [name]\'s grandmother had MS- they are now concerned she has MS as well.  Depression- She has already noticed slight improvement with increased dose of Zoloft.  No SI or HI, just felt like drive to "get out of bed and do something!"   Patient Active Problem List   Diagnosis Date Noted  . Dizziness and giddiness 05/25/2012  . Low back pain 03/30/2012  . Pain, coccyx 02/29/2012  . Neuropathy 01/14/2011  . Insomnia 01/14/2011  . 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 HTN (hypertension)   . COPD (chronic obstructive pulmonary disease)   . Depression   . Thyroid disease    Past Medical History  Diagnosis Date  . HTN (hypertension)   . COPD (chronic obstructive pulmonary disease)   . Depression   . Thyroid disease   . HLD (hyperlipidemia)   . CAD (coronary artery disease)     h/o NSTEMI, LAD stent placement 10/04, cath 05/11/2010  . PMR (polymyalgia rheumatica)     with h/o steroid induced myopathy  . Anxiety   . Diastolic dysfunction     Echo 05/11/2010, EF 65-70%  . TIA  (transient ischemic attack)     11 /05 s/p L CEA   Past Surgical History  Procedure Laterality Date  . Cholecystectomy    . Carotid endarterectomy      left with stent placement 11/2003  . Iliac artery stent      right, 2001   History  Substance Use Topics  . Smoking status: Former Smoker    Quit date: 01/05/1996  . Smokeless tobacco: Never Used  . Alcohol Use: No   No family history on file. Allergies  Allergen Reactions  . Codeine Other (See Comments)    Coma for 2 days  . Prednisone Other (See Comments)    Weight gain,puffy face   Current Outpatient Prescriptions on File Prior to Visit  Medication Sig Dispense Refill  . ADVAIR DISKUS 250-50 MCG/DOSE AEPB INHALE 1 PUFF INTO THE LUNGS 2 (TWO) TIMES DAILY.  1 each  6  . albuterol (PROVENTIL HFA;VENTOLIN HFA) 108 (90 BASE) MCG/ACT inhaler Inhale 2 puffs into the lungs every 6 (six) hours as needed for wheezing.  1 Inhaler  0  . aspirin EC 81 MG tablet Take 81 mg by mouth daily.        Marland Kitchen atorvastatin (LIPITOR) 40 MG tablet TAKE 1 TABLET BY MOUTH EVERY DAY  90 tablet  1  . ciprofloxacin (CIPRO) 250 MG tablet Take  one tablet by mouth twice a day x 5 days.  10 tablet  0  . clopidogrel (PLAVIX) 75 MG tablet TAKE 1 TABLET DAILY  90 tablet  1  . fluconazole (DIFLUCAN) 150 MG tablet Take 1 tablet (150 mg total) by mouth once.  1 tablet  1  . gabapentin (NEURONTIN) 800 MG tablet 1 capsule by mouth in AM and 2 capsules by mouth in PM. 1 capsule by mouth in AM and 2 capsules by mouth in PM.  180 tablet  0  . levothyroxine (SYNTHROID, LEVOTHROID) 25 MCG tablet TAKE 1 TABLET BY MOUTH EVERY MORNING  90 tablet  1  . Levothyroxine Sodium 25 MCG CAPS Take 1 capsule by mouth daily.        Marland Kitchen lidocaine (LINDAMANTLE) 3 % CREA cream Apply 1 application topically as needed.        Marland Kitchen lisinopril (PRINIVIL,ZESTRIL) 10 MG tablet Take 2 tablets (20 mg total) by mouth daily.  60 tablet  3  . metoprolol tartrate (LOPRESSOR) 25 MG tablet TAKE 1/2 TABLET  (=12.5 MG) DAILY  45 tablet  1  . nitroGLYCERIN (NITROSTAT) 0.4 MG SL tablet Place 0.4 mg under the tongue every 5 (five) minutes as needed.        . ranitidine (ZANTAC) 300 MG tablet TAKE 1 TABLET (300 MG TOTAL) BY MOUTH AT BEDTIME.  90 tablet  1  . ranitidine (ZANTAC) 300 MG tablet TAKE 1 TABLET (300 MG TOTAL) BY MOUTH AT BEDTIME.  90 tablet  1  . sertraline (ZOLOFT) 100 MG tablet TAKE 1 TABLET BY MOUTH EVERY DAY  5 tablet  2  . SPIRIVA HANDIHALER 18 MCG inhalation capsule PLACE 1 CAPSULE (18 MCG TOTAL) INTO INHALER AND INHALE DAILY.  1 each  3  . SPIRIVA HANDIHALER 18 MCG inhalation capsule PLACE 1 CAPSULE (18 MCG TOTAL) INTO INHALER AND INHALE DAILY.  30 capsule  5  . terconazole (TERAZOL 7) 0.4 % vaginal cream Place 1 applicator vaginally at bedtime.  45 g  0  . traZODone (DESYREL) 50 MG tablet TAKE 0.5-1 TABLETS (25-50 MG TOTAL) BY MOUTH AT BEDTIME AS NEEDED FOR SLEEP.  30 tablet  3   No current facility-administered medications on file prior to visit.   The PMH, PSH, Social History, Family History, Medications, and allergies have been reviewed in [facility], and have been updated if relevant.     ROS: See HPI No dysuria.  Physical exam: BP 98/44  Pulse 72  Temp(Src) 98.9 F (37.2 C)  Wt 203 lb (92.08 kg)  BMI 35.39 kg/m2   General:  Well-developed,well-nourished,in no acute distress; alert,appropriate and cooperative throughout examination Head:  normocephalic and atraumatic.   Eyes:  vision grossly intact, pupils equal, pupils round, and pupils reactive to light.   Ears:  R ear normal and L ear normal.   Nose:  no external deformity, pos O2 per [place] Mouth:  good dentition Lungs:  Normal respiratory effort, chest expands symmetrically.  Heart:  Normal rate and regular rhythm. S1 and S2 normal without gallop, murmur, click, rub or other extra sounds. Skin:  Intact without suspicious lesions or rashes Neuro:  CNII-XII intact, decreased grip strength bilaterally Right patellar  reflex diminished compared to left. She was walking in a straight line for me today Psych:  Cognition and judgment appear intact. Alert and cooperative with normal attention span and concentration. No apparent delusions, illusions, hallucinations  Assessment and Plan:  1. Depression Deteriorated with now mild improvement with increased dose of  Zoloft.  I have also referred her to see Dr. Rexene Edison. Follow up in 3 weeks.  2. Abnormality of gait Resolved.  ?TIA vs neurological process.  Check labs today.  Given duration of her symptoms, will proceed with MRI- rule out mass, demyelinating lesions, etc. The patient indicates understanding of these issues and agrees with the plan.  - MR Brain W Wo Contrast; Future - Comprehensive metabolic panel - CBC with Differential   - TSH - T4, Free

## 2012-05-25 NOTE — Patient Instructions (Addendum)
Good to see you. I will call you with your lab results.  We will call you with your MRI appointment.

## 2012-06-07 ENCOUNTER — Ambulatory Visit: Payer: Self-pay | Admitting: Family Medicine

## 2012-06-07 ENCOUNTER — Other Ambulatory Visit: Payer: Self-pay | Admitting: Family Medicine

## 2012-06-07 DIAGNOSIS — R269 Unspecified abnormalities of gait and mobility: Secondary | ICD-10-CM | POA: Diagnosis not present

## 2012-06-07 DIAGNOSIS — R930 Abnormal findings on diagnostic imaging of skull and head, not elsewhere classified: Secondary | ICD-10-CM | POA: Diagnosis not present

## 2012-06-08 ENCOUNTER — Encounter: Payer: Self-pay | Admitting: Family Medicine

## 2012-06-09 ENCOUNTER — Other Ambulatory Visit: Payer: Self-pay

## 2012-06-09 MED ORDER — FLUCONAZOLE 150 MG PO TABS: 150.0000 mg | ORAL_TABLET | Freq: Once | ORAL | Status: DC

## 2012-06-09 NOTE — Telephone Encounter (Signed)
Joy [name] pts daughter request refill Diflucan to [facility]; pt had tooth extracted on 06/08/12 and was prescribed Amoxicillin. Pt has hx of taking Amoxicillin and getting yeast infection. Joy [name] request Diflucan to [facility].Please advise. Dr Deborra Medina not available.

## 2012-06-09 NOTE — Telephone Encounter (Signed)
rx sent to pharmacy by e-script Left message for daughter that rx was sent in

## 2012-06-09 NOTE — Telephone Encounter (Signed)
Okay to refill #2 x 0 Take one now and repeat in 1 week prn

## 2012-06-21 ENCOUNTER — Ambulatory Visit (INDEPENDENT_AMBULATORY_CARE_PROVIDER_SITE_OTHER): Payer: Medicare Other | Admitting: Psychology

## 2012-06-21 DIAGNOSIS — F331 Major depressive disorder, recurrent, moderate: Secondary | ICD-10-CM

## 2012-06-28 ENCOUNTER — Other Ambulatory Visit: Payer: Self-pay | Admitting: Family Medicine

## 2012-07-05 ENCOUNTER — Other Ambulatory Visit: Payer: Self-pay | Admitting: *Deleted

## 2012-07-05 ENCOUNTER — Other Ambulatory Visit: Payer: Self-pay | Admitting: Family Medicine

## 2012-07-05 MED ORDER — LEVOTHYROXINE SODIUM 25 MCG PO CAPS: 1.0000 | ORAL_CAPSULE | Freq: Every day | ORAL | Status: DC

## 2012-07-13 ENCOUNTER — Telehealth: Payer: Self-pay | Admitting: *Deleted

## 2012-07-13 NOTE — Telephone Encounter (Signed)
Pt's long term care insurance company has mailed a form for completion.  Form is on your desk.

## 2012-07-14 NOTE — Telephone Encounter (Signed)
Form completed, original mailed back to [employer], copy sent for scanning.

## 2012-07-18 ENCOUNTER — Ambulatory Visit (INDEPENDENT_AMBULATORY_CARE_PROVIDER_SITE_OTHER): Payer: Medicare Other | Admitting: Psychology

## 2012-07-18 DIAGNOSIS — F331 Major depressive disorder, recurrent, moderate: Secondary | ICD-10-CM

## 2012-07-21 ENCOUNTER — Telehealth: Payer: Self-pay | Admitting: *Deleted

## 2012-07-21 NOTE — Telephone Encounter (Signed)
Pt's daughter states patient is still having problems with balance, weakness on right side. MRI results didn't show a cause. Daughter is asking if she needs to do anything else about this.  Would PT help?  Please advise.

## 2012-07-21 NOTE — Telephone Encounter (Signed)
We could try PT at [facility].  Also could try neurology evaluation if she feels this is progressing.

## 2012-07-21 NOTE — Telephone Encounter (Signed)
Left message asking daughter to call me back.

## 2012-07-24 NOTE — Telephone Encounter (Signed)
Advised pt's daughter.  She says she is concerned that pt mistook her daughter for her granddaughter in a conversation.  She is concerned about pt being confused and is wondering if she should bring pt in to be checked for another UTI.  Would it be possible for Mandy to check the urine at [facility] and call us with results?

## 2012-07-25 NOTE — Telephone Encounter (Signed)
Yes ok for Joy [name] or Joy [name] to check UA.

## 2012-07-25 NOTE — Telephone Encounter (Signed)
Advised daughter [name].  She will call [facility] regarding checking UA.  Also, she thinks that pt would better benefit from neurology referral, rather than PT, but she will discuss this with patient and will call back.

## 2012-07-30 ENCOUNTER — Other Ambulatory Visit: Payer: Self-pay | Admitting: Family Medicine

## 2012-08-03 ENCOUNTER — Encounter: Payer: Self-pay | Admitting: Family Medicine

## 2012-08-03 ENCOUNTER — Ambulatory Visit (INDEPENDENT_AMBULATORY_CARE_PROVIDER_SITE_OTHER): Payer: Medicare Other | Admitting: Family Medicine

## 2012-08-03 VITALS — BP 110/52 | HR 64 | Temp 98.0°F | Ht 63.5 in | Wt 206.0 lb

## 2012-08-03 DIAGNOSIS — G629 Polyneuropathy, unspecified: Secondary | ICD-10-CM

## 2012-08-03 DIAGNOSIS — F05 Delirium due to known physiological condition: Secondary | ICD-10-CM | POA: Diagnosis not present

## 2012-08-03 DIAGNOSIS — R269 Unspecified abnormalities of gait and mobility: Secondary | ICD-10-CM

## 2012-08-03 MED ORDER — FLUCONAZOLE 150 MG PO TABS: 150.0000 mg | ORAL_TABLET | Freq: Once | ORAL | Status: AC

## 2012-08-03 MED ORDER — CIPROFLOXACIN HCL 250 MG PO TABS: ORAL_TABLET | ORAL | Status: DC

## 2012-08-03 NOTE — Patient Instructions (Addendum)
Good to see you. Please take cipro as directed - 1 tablet twice daily x 7 days.  Keep me posted.

## 2012-08-03 NOTE — Progress Notes (Signed)
79 yo with h/o COPD, depression, MELAS and interstitial lung disease here with her daughter, [name], for ? UTI.  Does have h/o UTIs without classic symptoms.  No dysuria.  No back pain.  Daughters have noted increased confusion. No fevers. No hematuria.  Lab Results  Component Value Date   WBC 10.5 05/25/2012   HGB 12.0 05/25/2012   HCT 36.2 05/25/2012   MCV 85.0 05/25/2012   PLT 221.0 05/25/2012   Lab Results  Component Value Date   TSH 2.23 05/25/2012    Patient Active Problem List   Diagnosis Date Noted  . Acute confusional state 08/03/2012  . Dizziness and giddiness 05/25/2012  . Abnormality of gait 05/25/2012  . Low back pain 03/30/2012  . Pain, coccyx 02/29/2012  . Neuropathy 01/14/2011  . Insomnia 01/14/2011  . 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 HTN (hypertension)   . COPD (chronic obstructive pulmonary disease)   . Depression   . Thyroid disease    Past Medical History  Diagnosis Date  . HTN (hypertension)   . COPD (chronic obstructive pulmonary disease)   . Depression   . Thyroid disease   . HLD (hyperlipidemia)   . CAD (coronary artery disease)     h/o NSTEMI, LAD stent placement 10/04, cath 05/11/2010  . PMR (polymyalgia rheumatica)     with h/o steroid induced myopathy  . Anxiety   . Diastolic dysfunction     Echo 05/11/2010, EF 65-70%  . TIA (transient ischemic attack)     11/05 s/p L CEA   Past Surgical History  Procedure Laterality Date  . Cholecystectomy    . Carotid endarterectomy      left with stent placement 11/2003  . Iliac artery stent      right, 2001   History  Substance Use Topics  . Smoking status: Former Smoker    Quit date: 01/05/1996  . Smokeless tobacco: Never Used  . Alcohol Use: No   No family history on file. Allergies  Allergen Reactions  . Codeine Other (See Comments)    Coma for 2 days  . Prednisone Other (See Comments)    Weight gain,puffy face   Current  Outpatient Prescriptions on File Prior to Visit  Medication Sig Dispense Refill  . ADVAIR DISKUS 250-50 MCG/DOSE AEPB INHALE 1 PUFF INTO THE LUNGS 2 (TWO) TIMES DAILY.  1 each  6  . ADVAIR DISKUS 250-50 MCG/DOSE AEPB INHALE 1 PUFF INTO THE LUNGS 2 (TWO) TIMES DAILY.  60 each  5  . albuterol (PROVENTIL HFA;VENTOLIN HFA) 108 (90 BASE) MCG/ACT inhaler Inhale 2 puffs into the lungs every 6 (six) hours as needed for wheezing.  1 Inhaler  0  . aspirin EC 81 MG tablet Take 81 mg by mouth daily.        Marland Kitchen atorvastatin (LIPITOR) 40 MG tablet TAKE 1 TABLET BY MOUTH EVERY DAY  90 tablet  1  . clopidogrel (PLAVIX) 75 MG tablet TAKE 1 TABLET DAILY  90 tablet  1  . gabapentin (NEURONTIN) 800 MG tablet 1 capsule by mouth in AM and 2 capsules by mouth in PM. 1 capsule by mouth in AM and 2 capsules by mouth in PM.  180 tablet  0  . levothyroxine (SYNTHROID, LEVOTHROID) 25 MCG tablet TAKE 1 TABLET BY MOUTH EVERY MORNING  90 tablet  1  . Levothyroxine Sodium 25 MCG CAPS Take 1 capsule (25 mcg total) by mouth daily.  30 capsule  5  . lidocaine (  LINDAMANTLE) 3 % CREA cream Apply 1 application topically as needed.        Marland Kitchen lisinopril (PRINIVIL,ZESTRIL) 10 MG tablet TAKE 2 TABLETS (20 MG TOTAL) BY MOUTH DAILY.  60 tablet  5  . metoprolol tartrate (LOPRESSOR) 25 MG tablet TAKE 1/2 TABLET (=12.5 MG) DAILY  45 tablet  1  . nitroGLYCERIN (NITROSTAT) 0.4 MG SL tablet Place 0.4 mg under the tongue every 5 (five) minutes as needed.        . ranitidine (ZANTAC) 300 MG tablet TAKE 1 TABLET (300 MG TOTAL) BY MOUTH AT BEDTIME.  90 tablet  1  . ranitidine (ZANTAC) 300 MG tablet TAKE 1 TABLET (300 MG TOTAL) BY MOUTH AT BEDTIME.  90 tablet  1  . sertraline (ZOLOFT) 100 MG tablet TAKE 1 TABLET BY MOUTH EVERY DAY  30 tablet  2  . SPIRIVA HANDIHALER 18 MCG inhalation capsule PLACE 1 CAPSULE (18 MCG TOTAL) INTO INHALER AND INHALE DAILY.  1 each  3  . terconazole (TERAZOL 7) 0.4 % vaginal cream Place 1 applicator vaginally at bedtime.  45  g  0  . traZODone (DESYREL) 50 MG tablet TAKE 0.5-1 TABLETS (25-50 MG TOTAL) BY MOUTH AT BEDTIME AS NEEDED FOR SLEEP.  30 tablet  3   No current facility-administered medications on file prior to visit.   The PMH, PSH, Social History, Family History, Medications, and allergies have been reviewed in [facility], and have been updated if relevant.     ROS: See HPI No dysuria.  Physical exam: BP 110/52  Pulse 64  Temp(Src) 98 F (36.7 C)  Ht 5' 3.5" (1.613 m)  Wt 206 lb (93.441 kg)  BMI 35.91 kg/m2   General:  Well-developed,well-nourished,in no acute distress; alert,appropriate and cooperative throughout examination Head:  normocephalic and atraumatic.   Eyes:  vision grossly intact, pupils equal, pupils round, and pupils reactive to light.   Ears:  R ear normal and L ear normal.   Nose:  no external deformity, pos O2 per [place]:  good dentition Lungs:  Normal respiratory effort, chest expands symmetrically.  Heart:  Normal rate and regular rhythm. S1 and S2 normal without gallop, murmur, click, rub or other extra sounds. Skin:  Intact without suspicious lesions or rashes Psych:  Cognition and judgment appear intact. Alert and cooperative with normal attention span and concentration. No apparent delusions, illusions, hallucinations  Assessment and Plan: 1. Acute confusional state Seems ok today.  Oriented x 3 and alert. She was unable to leave urine sample- sent her home with specimen cup. Given her history of previous urine cx that were inconclusive and that her current symptoms are similar to prior, will treat empirically for UTI with cipro 250 mg twice daily x 7 days. The patient indicates understanding of these issues and agrees with the plan.

## 2012-08-04 ENCOUNTER — Ambulatory Visit: Payer: Medicare Other | Admitting: *Deleted

## 2012-08-04 DIAGNOSIS — R300 Dysuria: Secondary | ICD-10-CM

## 2012-08-06 LAB — URINE CULTURE

## 2012-08-07 ENCOUNTER — Emergency Department: Payer: Self-pay | Admitting: Emergency Medicine

## 2012-08-07 DIAGNOSIS — T07XXXA Unspecified multiple injuries, initial encounter: Secondary | ICD-10-CM | POA: Diagnosis not present

## 2012-08-07 DIAGNOSIS — Z888 Allergy status to other drugs, medicaments and biological substances status: Secondary | ICD-10-CM | POA: Diagnosis not present

## 2012-08-07 DIAGNOSIS — Z7901 Long term (current) use of anticoagulants: Secondary | ICD-10-CM | POA: Diagnosis not present

## 2012-08-07 DIAGNOSIS — M79609 Pain in unspecified limb: Secondary | ICD-10-CM | POA: Diagnosis not present

## 2012-08-07 DIAGNOSIS — I498 Other specified cardiac arrhythmias: Secondary | ICD-10-CM | POA: Diagnosis not present

## 2012-08-07 DIAGNOSIS — Z7982 Long term (current) use of aspirin: Secondary | ICD-10-CM | POA: Diagnosis not present

## 2012-08-07 DIAGNOSIS — Z8673 Personal history of transient ischemic attack (TIA), and cerebral infarction without residual deficits: Secondary | ICD-10-CM | POA: Diagnosis not present

## 2012-08-07 DIAGNOSIS — R5383 Other fatigue: Secondary | ICD-10-CM | POA: Diagnosis not present

## 2012-08-07 DIAGNOSIS — I252 Old myocardial infarction: Secondary | ICD-10-CM | POA: Diagnosis not present

## 2012-08-07 DIAGNOSIS — Z79899 Other long term (current) drug therapy: Secondary | ICD-10-CM | POA: Diagnosis not present

## 2012-08-07 DIAGNOSIS — M25569 Pain in unspecified knee: Secondary | ICD-10-CM | POA: Diagnosis not present

## 2012-08-07 DIAGNOSIS — R6889 Other general symptoms and signs: Secondary | ICD-10-CM | POA: Diagnosis not present

## 2012-08-07 DIAGNOSIS — S8000XA Contusion of unspecified knee, initial encounter: Secondary | ICD-10-CM | POA: Diagnosis not present

## 2012-08-07 DIAGNOSIS — R5381 Other malaise: Secondary | ICD-10-CM | POA: Diagnosis not present

## 2012-08-07 DIAGNOSIS — J449 Chronic obstructive pulmonary disease, unspecified: Secondary | ICD-10-CM | POA: Diagnosis not present

## 2012-08-07 DIAGNOSIS — S5000XA Contusion of unspecified elbow, initial encounter: Secondary | ICD-10-CM | POA: Diagnosis not present

## 2012-08-07 LAB — COMPREHENSIVE METABOLIC PANEL
Albumin: 3.1 g/dL — ABNORMAL LOW (ref 3.4–5.0)
BUN: 15 mg/dL (ref 7–18)
Bilirubin,Total: 0.5 mg/dL (ref 0.2–1.0)
Calcium, Total: 8.7 mg/dL (ref 8.5–10.1)
Chloride: 107 mmol/L (ref 98–107)
Co2: 31 mmol/L (ref 21–32)
EGFR (African American): >60
EGFR (Non-African Amer.): 58 — ABNORMAL LOW
Glucose: 84 mg/dL (ref 65–99)
Osmolality: 283 (ref 275–301)
Total Protein: 6.1 g/dL — ABNORMAL LOW (ref 6.4–8.2)

## 2012-08-07 LAB — CBC
HCT: 34.9 % — ABNORMAL LOW (ref 35.0–47.0)
HGB: 11.8 g/dL — ABNORMAL LOW (ref 12.0–16.0)
MCH: 28.7 pg (ref 26.0–34.0)
MCV: 85 fL (ref 80–100)
RBC: 4.13 10*6/uL (ref 3.80–5.20)

## 2012-08-08 ENCOUNTER — Ambulatory Visit: Payer: Medicare Other | Admitting: Psychology

## 2012-08-09 ENCOUNTER — Other Ambulatory Visit: Payer: Self-pay

## 2012-08-10 DIAGNOSIS — M6281 Muscle weakness (generalized): Secondary | ICD-10-CM | POA: Diagnosis not present

## 2012-08-11 DIAGNOSIS — M6281 Muscle weakness (generalized): Secondary | ICD-10-CM | POA: Diagnosis not present

## 2012-08-12 DIAGNOSIS — M6281 Muscle weakness (generalized): Secondary | ICD-10-CM | POA: Diagnosis not present

## 2012-08-13 DIAGNOSIS — M6281 Muscle weakness (generalized): Secondary | ICD-10-CM | POA: Diagnosis not present

## 2012-08-14 DIAGNOSIS — M6281 Muscle weakness (generalized): Secondary | ICD-10-CM | POA: Diagnosis not present

## 2012-08-15 DIAGNOSIS — M6281 Muscle weakness (generalized): Secondary | ICD-10-CM | POA: Diagnosis not present

## 2012-08-16 DIAGNOSIS — M6281 Muscle weakness (generalized): Secondary | ICD-10-CM | POA: Diagnosis not present

## 2012-08-17 DIAGNOSIS — J449 Chronic obstructive pulmonary disease, unspecified: Secondary | ICD-10-CM

## 2012-08-17 DIAGNOSIS — S93609A Unspecified sprain of unspecified foot, initial encounter: Secondary | ICD-10-CM | POA: Diagnosis not present

## 2012-08-17 DIAGNOSIS — G589 Mononeuropathy, unspecified: Secondary | ICD-10-CM | POA: Diagnosis not present

## 2012-08-17 DIAGNOSIS — I10 Essential (primary) hypertension: Secondary | ICD-10-CM | POA: Diagnosis not present

## 2012-08-17 DIAGNOSIS — M6281 Muscle weakness (generalized): Secondary | ICD-10-CM | POA: Diagnosis not present

## 2012-08-18 DIAGNOSIS — M6281 Muscle weakness (generalized): Secondary | ICD-10-CM | POA: Diagnosis not present

## 2012-08-19 DIAGNOSIS — M6281 Muscle weakness (generalized): Secondary | ICD-10-CM | POA: Diagnosis not present

## 2012-08-19 DIAGNOSIS — R4182 Altered mental status, unspecified: Secondary | ICD-10-CM | POA: Diagnosis not present

## 2012-08-19 DIAGNOSIS — I2510 Atherosclerotic heart disease of native coronary artery without angina pectoris: Secondary | ICD-10-CM | POA: Diagnosis not present

## 2012-08-19 DIAGNOSIS — R404 Transient alteration of awareness: Secondary | ICD-10-CM | POA: Diagnosis not present

## 2012-08-19 DIAGNOSIS — I739 Peripheral vascular disease, unspecified: Secondary | ICD-10-CM | POA: Diagnosis not present

## 2012-08-19 DIAGNOSIS — R10819 Abdominal tenderness, unspecified site: Secondary | ICD-10-CM | POA: Diagnosis not present

## 2012-08-19 DIAGNOSIS — E860 Dehydration: Secondary | ICD-10-CM | POA: Diagnosis not present

## 2012-08-19 LAB — URINALYSIS, COMPLETE
Blood: NEGATIVE
Glucose,UR: NEGATIVE mg/dL (ref 0–75)
Leukocyte Esterase: NEGATIVE
Nitrite: NEGATIVE
RBC,UR: NONE SEEN /HPF (ref 0–5)
Specific Gravity: 1.021 (ref 1.003–1.030)

## 2012-08-19 LAB — COMPREHENSIVE METABOLIC PANEL
Alkaline Phosphatase: 171 U/L — ABNORMAL HIGH (ref 50–136)
Anion Gap: 10 (ref 7–16)
BUN: 21 mg/dL — ABNORMAL HIGH (ref 7–18)
Calcium, Total: 9.3 mg/dL (ref 8.5–10.1)
Chloride: 100 mmol/L (ref 98–107)
Co2: 28 mmol/L (ref 21–32)
EGFR (African American): >60
EGFR (Non-African Amer.): >60
Glucose: 98 mg/dL (ref 65–99)
Osmolality: 279 (ref 275–301)
SGOT(AST): 27 U/L (ref 15–37)
SGPT (ALT): 15 U/L (ref 12–78)
Total Protein: 7.1 g/dL (ref 6.4–8.2)

## 2012-08-19 LAB — CK TOTAL AND CKMB (NOT AT ARMC)
CK, Total: 110 U/L (ref 21–215)
CK-MB: 1.7 ng/mL (ref 0.5–3.6)

## 2012-08-19 LAB — TSH: Thyroid Stimulating Horm: 1.1 u[IU]/mL

## 2012-08-19 LAB — CBC
HCT: 40.5 % (ref 35.0–47.0)
HGB: 13.5 g/dL (ref 12.0–16.0)
MCHC: 33.4 g/dL (ref 32.0–36.0)
MCV: 85 fL (ref 80–100)
Platelet: 224 10*3/uL (ref 150–440)
RBC: 4.78 10*6/uL (ref 3.80–5.20)
RDW: 15.4 % — ABNORMAL HIGH (ref 11.5–14.5)

## 2012-08-19 LAB — TROPONIN I: Troponin-I: 0.08 ng/mL — ABNORMAL HIGH

## 2012-08-20 ENCOUNTER — Ambulatory Visit: Payer: Self-pay | Admitting: Neurology

## 2012-08-20 DIAGNOSIS — R569 Unspecified convulsions: Secondary | ICD-10-CM | POA: Diagnosis not present

## 2012-08-20 DIAGNOSIS — I10 Essential (primary) hypertension: Secondary | ICD-10-CM | POA: Diagnosis not present

## 2012-08-20 DIAGNOSIS — I2510 Atherosclerotic heart disease of native coronary artery without angina pectoris: Secondary | ICD-10-CM | POA: Diagnosis not present

## 2012-08-20 DIAGNOSIS — I739 Peripheral vascular disease, unspecified: Secondary | ICD-10-CM | POA: Diagnosis not present

## 2012-08-20 DIAGNOSIS — R4182 Altered mental status, unspecified: Secondary | ICD-10-CM | POA: Diagnosis not present

## 2012-08-20 DIAGNOSIS — M19079 Primary osteoarthritis, unspecified ankle and foot: Secondary | ICD-10-CM | POA: Diagnosis not present

## 2012-08-20 DIAGNOSIS — E860 Dehydration: Secondary | ICD-10-CM | POA: Diagnosis not present

## 2012-08-20 LAB — CBC WITH DIFFERENTIAL/PLATELET
Basophil #: 0 10*3/uL (ref 0.0–0.1)
Basophil %: 0.3 %
Eosinophil %: 0.3 %
HCT: 35.8 % (ref 35.0–47.0)
Lymphocyte #: 1.1 10*3/uL (ref 1.0–3.6)
Lymphocyte %: 9.7 %
MCH: 28.7 pg (ref 26.0–34.0)
MCV: 85 fL (ref 80–100)
Monocyte #: 1.2 x10 3/mm — ABNORMAL HIGH (ref 0.2–0.9)
Monocyte %: 10.6 %
Neutrophil %: 79.1 %
Platelet: 199 10*3/uL (ref 150–440)
RBC: 4.22 10*6/uL (ref 3.80–5.20)
RDW: 15.7 % — ABNORMAL HIGH (ref 11.5–14.5)
WBC: 11 10*3/uL (ref 3.6–11.0)

## 2012-08-20 LAB — TROPONIN I: Troponin-I: 0.08 ng/mL — ABNORMAL HIGH

## 2012-08-20 LAB — MAGNESIUM: Magnesium: 1.9 mg/dL

## 2012-08-20 LAB — LIPID PANEL
Cholesterol: 142 mg/dL (ref 0–200)
HDL Cholesterol: 39 mg/dL — ABNORMAL LOW (ref 40–60)

## 2012-08-20 LAB — COMPREHENSIVE METABOLIC PANEL
Anion Gap: 7 (ref 7–16)
Co2: 28 mmol/L (ref 21–32)
Creatinine: 0.87 mg/dL (ref 0.60–1.30)
EGFR (African American): >60
EGFR (Non-African Amer.): >60
SGPT (ALT): 14 U/L (ref 12–78)
Sodium: 141 mmol/L (ref 136–145)
Total Protein: 6 g/dL — ABNORMAL LOW (ref 6.4–8.2)

## 2012-08-21 DIAGNOSIS — R4182 Altered mental status, unspecified: Secondary | ICD-10-CM | POA: Diagnosis not present

## 2012-08-21 DIAGNOSIS — I10 Essential (primary) hypertension: Secondary | ICD-10-CM | POA: Diagnosis not present

## 2012-08-21 DIAGNOSIS — I2510 Atherosclerotic heart disease of native coronary artery without angina pectoris: Secondary | ICD-10-CM | POA: Diagnosis not present

## 2012-08-21 DIAGNOSIS — I739 Peripheral vascular disease, unspecified: Secondary | ICD-10-CM | POA: Diagnosis not present

## 2012-08-21 DIAGNOSIS — E860 Dehydration: Secondary | ICD-10-CM | POA: Diagnosis not present

## 2012-08-22 ENCOUNTER — Inpatient Hospital Stay: Payer: Self-pay | Admitting: Student

## 2012-08-22 DIAGNOSIS — M353 Polymyalgia rheumatica: Secondary | ICD-10-CM | POA: Diagnosis present

## 2012-08-22 DIAGNOSIS — Z9109 Other allergy status, other than to drugs and biological substances: Secondary | ICD-10-CM | POA: Diagnosis not present

## 2012-08-22 DIAGNOSIS — I739 Peripheral vascular disease, unspecified: Secondary | ICD-10-CM | POA: Diagnosis present

## 2012-08-22 DIAGNOSIS — Z87891 Personal history of nicotine dependence: Secondary | ICD-10-CM | POA: Diagnosis not present

## 2012-08-22 DIAGNOSIS — Z888 Allergy status to other drugs, medicaments and biological substances status: Secondary | ICD-10-CM | POA: Diagnosis not present

## 2012-08-22 DIAGNOSIS — G7289 Other specified myopathies: Secondary | ICD-10-CM | POA: Diagnosis present

## 2012-08-22 DIAGNOSIS — G929 Unspecified toxic encephalopathy: Secondary | ICD-10-CM | POA: Diagnosis present

## 2012-08-22 DIAGNOSIS — Z9861 Coronary angioplasty status: Secondary | ICD-10-CM | POA: Diagnosis not present

## 2012-08-22 DIAGNOSIS — E785 Hyperlipidemia, unspecified: Secondary | ICD-10-CM | POA: Diagnosis present

## 2012-08-22 DIAGNOSIS — Z882 Allergy status to sulfonamides status: Secondary | ICD-10-CM | POA: Diagnosis not present

## 2012-08-22 DIAGNOSIS — F0150 Vascular dementia without behavioral disturbance: Secondary | ICD-10-CM | POA: Diagnosis present

## 2012-08-22 DIAGNOSIS — Z823 Family history of stroke: Secondary | ICD-10-CM | POA: Diagnosis not present

## 2012-08-22 DIAGNOSIS — R569 Unspecified convulsions: Secondary | ICD-10-CM | POA: Diagnosis present

## 2012-08-22 DIAGNOSIS — E669 Obesity, unspecified: Secondary | ICD-10-CM | POA: Diagnosis present

## 2012-08-22 DIAGNOSIS — Z885 Allergy status to narcotic agent status: Secondary | ICD-10-CM | POA: Diagnosis not present

## 2012-08-22 DIAGNOSIS — I2510 Atherosclerotic heart disease of native coronary artery without angina pectoris: Secondary | ICD-10-CM | POA: Diagnosis present

## 2012-08-22 DIAGNOSIS — K5900 Constipation, unspecified: Secondary | ICD-10-CM | POA: Diagnosis present

## 2012-08-22 DIAGNOSIS — R079 Chest pain, unspecified: Secondary | ICD-10-CM | POA: Diagnosis not present

## 2012-08-22 DIAGNOSIS — J9610 Chronic respiratory failure, unspecified whether with hypoxia or hypercapnia: Secondary | ICD-10-CM | POA: Diagnosis present

## 2012-08-22 DIAGNOSIS — I10 Essential (primary) hypertension: Secondary | ICD-10-CM | POA: Diagnosis present

## 2012-08-22 DIAGNOSIS — E860 Dehydration: Secondary | ICD-10-CM | POA: Diagnosis present

## 2012-08-22 DIAGNOSIS — R4182 Altered mental status, unspecified: Secondary | ICD-10-CM | POA: Diagnosis not present

## 2012-08-22 DIAGNOSIS — M6281 Muscle weakness (generalized): Secondary | ICD-10-CM | POA: Diagnosis not present

## 2012-08-22 DIAGNOSIS — J449 Chronic obstructive pulmonary disease, unspecified: Secondary | ICD-10-CM | POA: Diagnosis present

## 2012-08-22 DIAGNOSIS — Z8673 Personal history of transient ischemic attack (TIA), and cerebral infarction without residual deficits: Secondary | ICD-10-CM | POA: Diagnosis not present

## 2012-08-22 DIAGNOSIS — Z881 Allergy status to other antibiotic agents status: Secondary | ICD-10-CM | POA: Diagnosis not present

## 2012-08-23 DIAGNOSIS — M6281 Muscle weakness (generalized): Secondary | ICD-10-CM | POA: Diagnosis not present

## 2012-08-23 DIAGNOSIS — R4182 Altered mental status, unspecified: Secondary | ICD-10-CM | POA: Diagnosis not present

## 2012-08-24 DIAGNOSIS — R4182 Altered mental status, unspecified: Secondary | ICD-10-CM | POA: Diagnosis not present

## 2012-08-24 DIAGNOSIS — M6281 Muscle weakness (generalized): Secondary | ICD-10-CM | POA: Diagnosis not present

## 2012-08-25 DIAGNOSIS — M6281 Muscle weakness (generalized): Secondary | ICD-10-CM | POA: Diagnosis not present

## 2012-08-25 DIAGNOSIS — Z9181 History of falling: Secondary | ICD-10-CM | POA: Diagnosis not present

## 2012-08-25 DIAGNOSIS — R4182 Altered mental status, unspecified: Secondary | ICD-10-CM | POA: Diagnosis not present

## 2012-08-25 DIAGNOSIS — G9349 Other encephalopathy: Secondary | ICD-10-CM | POA: Diagnosis not present

## 2012-08-25 DIAGNOSIS — J9620 Acute and chronic respiratory failure, unspecified whether with hypoxia or hypercapnia: Secondary | ICD-10-CM | POA: Diagnosis not present

## 2012-08-27 DIAGNOSIS — M6281 Muscle weakness (generalized): Secondary | ICD-10-CM | POA: Diagnosis not present

## 2012-08-27 DIAGNOSIS — R4182 Altered mental status, unspecified: Secondary | ICD-10-CM | POA: Diagnosis not present

## 2012-08-28 ENCOUNTER — Emergency Department: Payer: Self-pay | Admitting: Emergency Medicine

## 2012-08-28 DIAGNOSIS — F0390 Unspecified dementia without behavioral disturbance: Secondary | ICD-10-CM | POA: Diagnosis not present

## 2012-08-28 DIAGNOSIS — Z882 Allergy status to sulfonamides status: Secondary | ICD-10-CM | POA: Diagnosis not present

## 2012-08-28 DIAGNOSIS — Z7982 Long term (current) use of aspirin: Secondary | ICD-10-CM | POA: Diagnosis not present

## 2012-08-28 DIAGNOSIS — R4182 Altered mental status, unspecified: Secondary | ICD-10-CM | POA: Diagnosis not present

## 2012-08-28 DIAGNOSIS — M6281 Muscle weakness (generalized): Secondary | ICD-10-CM | POA: Diagnosis not present

## 2012-08-28 DIAGNOSIS — R079 Chest pain, unspecified: Secondary | ICD-10-CM | POA: Diagnosis not present

## 2012-08-28 DIAGNOSIS — Z888 Allergy status to other drugs, medicaments and biological substances status: Secondary | ICD-10-CM | POA: Diagnosis not present

## 2012-08-28 DIAGNOSIS — Z79899 Other long term (current) drug therapy: Secondary | ICD-10-CM | POA: Diagnosis not present

## 2012-08-28 DIAGNOSIS — I10 Essential (primary) hypertension: Secondary | ICD-10-CM | POA: Diagnosis not present

## 2012-08-28 LAB — URINALYSIS, COMPLETE
Blood: NEGATIVE
Glucose,UR: NEGATIVE mg/dL (ref 0–75)
Ph: 6 (ref 4.5–8.0)
Protein: 25
Specific Gravity: 1.02 (ref 1.003–1.030)
Squamous Epithelial: 4

## 2012-08-28 LAB — CK TOTAL AND CKMB (NOT AT ARMC): CK, Total: 42 U/L (ref 21–215)

## 2012-08-28 LAB — CBC
HGB: 12.3 g/dL (ref 12.0–16.0)
MCH: 28.5 pg (ref 26.0–34.0)
MCV: 86 fL (ref 80–100)
Platelet: 245 10*3/uL (ref 150–440)
RBC: 4.32 10*6/uL (ref 3.80–5.20)
RDW: 15.9 % — ABNORMAL HIGH (ref 11.5–14.5)
WBC: 10.3 10*3/uL (ref 3.6–11.0)

## 2012-08-28 LAB — TROPONIN I
Troponin-I: 0.03 ng/mL
Troponin-I: 0.03 ng/mL

## 2012-08-28 LAB — COMPREHENSIVE METABOLIC PANEL
Alkaline Phosphatase: 135 U/L (ref 50–136)
Anion Gap: 3 — ABNORMAL LOW (ref 7–16)
Co2: 33 mmol/L — ABNORMAL HIGH (ref 21–32)
EGFR (Non-African Amer.): 50 — ABNORMAL LOW
Glucose: 108 mg/dL — ABNORMAL HIGH (ref 65–99)
SGOT(AST): 22 U/L (ref 15–37)
SGPT (ALT): 19 U/L (ref 12–78)
Sodium: 141 mmol/L (ref 136–145)

## 2012-08-29 DIAGNOSIS — M6281 Muscle weakness (generalized): Secondary | ICD-10-CM | POA: Diagnosis not present

## 2012-08-29 DIAGNOSIS — R4182 Altered mental status, unspecified: Secondary | ICD-10-CM | POA: Diagnosis not present

## 2012-08-30 DIAGNOSIS — R4182 Altered mental status, unspecified: Secondary | ICD-10-CM | POA: Diagnosis not present

## 2012-08-30 DIAGNOSIS — M6281 Muscle weakness (generalized): Secondary | ICD-10-CM | POA: Diagnosis not present

## 2012-08-30 LAB — URINE CULTURE

## 2012-08-31 DIAGNOSIS — M6281 Muscle weakness (generalized): Secondary | ICD-10-CM | POA: Diagnosis not present

## 2012-08-31 DIAGNOSIS — R4182 Altered mental status, unspecified: Secondary | ICD-10-CM | POA: Diagnosis not present

## 2012-09-02 DIAGNOSIS — M6281 Muscle weakness (generalized): Secondary | ICD-10-CM | POA: Diagnosis not present

## 2012-09-02 DIAGNOSIS — R4182 Altered mental status, unspecified: Secondary | ICD-10-CM | POA: Diagnosis not present

## 2012-09-02 LAB — CULTURE, BLOOD (SINGLE)

## 2012-09-04 DIAGNOSIS — M6281 Muscle weakness (generalized): Secondary | ICD-10-CM | POA: Diagnosis not present

## 2012-09-04 DIAGNOSIS — G9341 Metabolic encephalopathy: Secondary | ICD-10-CM | POA: Diagnosis not present

## 2012-09-05 DIAGNOSIS — G9341 Metabolic encephalopathy: Secondary | ICD-10-CM | POA: Diagnosis not present

## 2012-09-05 DIAGNOSIS — M6281 Muscle weakness (generalized): Secondary | ICD-10-CM | POA: Diagnosis not present

## 2012-09-05 DIAGNOSIS — R262 Difficulty in walking, not elsewhere classified: Secondary | ICD-10-CM | POA: Diagnosis not present

## 2012-09-05 DIAGNOSIS — R413 Other amnesia: Secondary | ICD-10-CM | POA: Diagnosis not present

## 2012-09-05 DIAGNOSIS — R439 Unspecified disturbances of smell and taste: Secondary | ICD-10-CM | POA: Diagnosis not present

## 2012-09-05 DIAGNOSIS — R209 Unspecified disturbances of skin sensation: Secondary | ICD-10-CM | POA: Diagnosis not present

## 2012-09-06 ENCOUNTER — Encounter: Payer: Self-pay | Admitting: Family Medicine

## 2012-09-06 ENCOUNTER — Other Ambulatory Visit: Payer: Self-pay | Admitting: Family Medicine

## 2012-09-06 ENCOUNTER — Ambulatory Visit (INDEPENDENT_AMBULATORY_CARE_PROVIDER_SITE_OTHER): Payer: Medicare Other | Admitting: Family Medicine

## 2012-09-06 VITALS — BP 120/50 | HR 60 | Temp 98.2°F | Wt 199.0 lb

## 2012-09-06 DIAGNOSIS — R4182 Altered mental status, unspecified: Secondary | ICD-10-CM | POA: Diagnosis not present

## 2012-09-06 DIAGNOSIS — I10 Essential (primary) hypertension: Secondary | ICD-10-CM

## 2012-09-06 DIAGNOSIS — K219 Gastro-esophageal reflux disease without esophagitis: Secondary | ICD-10-CM | POA: Insufficient documentation

## 2012-09-06 MED ORDER — AMLODIPINE BESYLATE 5 MG PO TABS
5.0000 mg | ORAL_TABLET | Freq: Every day | ORAL | Status: DC
Start: 2012-09-06 — End: 2012-10-26

## 2012-09-06 MED ORDER — METOPROLOL TARTRATE 12.5 MG HALF TABLET: 12.5000 mg | ORAL_TABLET | Freq: Every day | ORAL | Status: DC

## 2012-09-06 NOTE — Progress Notes (Signed)
79 yo with h/o COPD, depression, MELAS and interstitial lung disease here with her daughter for hospital/ SNF follow up.  Notes from [facility] reviewed.  Was sent to [facility] for AMS felt to be secondary to Tramadol and phenergan dosing given to her at [facility]. CE , UA, head CT, Ct of ad/pelvis were neg.  MRI of brain showed no acute event.  Meds were adjusted because BP was elevated- metoprolol was increased to 12.5 mg twice daily and amlodipine 5 mg daily was added.  She feels better now, at baseline but very tired and lethargic.  Saw neuro, Dr. Melrose Nakayama.  According to her daughter, [name], blood work was done.   Lab Results  Component Value Date   WBC 10.5 05/25/2012   HGB 12.0 05/25/2012   HCT 36.2 05/25/2012   MCV 85.0 05/25/2012   PLT 221.0 05/25/2012   Lab Results  Component Value Date   TSH 2.23 05/25/2012    [name] Active Problem List   Diagnosis Date Noted  . Altered mental status 09/06/2012  . Acute confusional state 08/03/2012  . Dizziness and giddiness 05/25/2012  . Abnormality of gait 05/25/2012  . Low back pain 03/30/2012  . Pain, coccyx 02/29/2012  . Neuropathy 01/14/2011  . Insomnia 01/14/2011  . 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 HTN (hypertension)   . COPD (chronic obstructive pulmonary disease)   . Depression   . Thyroid disease    Past Medical History  Diagnosis Date  . HTN (hypertension)   . COPD (chronic obstructive pulmonary disease)   . Depression   . Thyroid disease   . HLD (hyperlipidemia)   . CAD (coronary artery disease)     h/o NSTEMI, LAD stent placement 10/04, cath 05/11/2010  . PMR (polymyalgia rheumatica)     with h/o steroid induced myopathy  . Anxiety   . Diastolic dysfunction     Echo 05/11/2010, EF 65-70%  . TIA (transient ischemic attack)     11/05 s/p L CEA   Past Surgical History  Procedure Laterality Date  . Cholecystectomy    . Carotid endarterectomy      left with stent  placement 11/2003  . Iliac artery stent      right, 2001   History  Substance Use Topics  . Smoking status: Former Smoker    Quit date: 01/05/1996  . Smokeless tobacco: Never Used  . Alcohol Use: No   No family history on file. Allergies  Allergen Reactions  . Codeine Other (See Comments)    Coma for 2 days  . Prednisone Other (See Comments)    Weight gain,puffy face   Current Outpatient Prescriptions on File Prior to Visit  Medication Sig Dispense Refill  . ADVAIR DISKUS 250-50 MCG/DOSE AEPB INHALE 1 PUFF INTO Joy LUNGS 2 (TWO) TIMES DAILY.  1 each  6  . ADVAIR DISKUS 250-50 MCG/DOSE AEPB INHALE 1 PUFF INTO Joy LUNGS 2 (TWO) TIMES DAILY.  60 each  5  . albuterol (PROVENTIL HFA;VENTOLIN HFA) 108 (90 BASE) MCG/ACT inhaler Inhale 2 puffs into Joy lungs every 6 (six) hours as needed for wheezing.  1 Inhaler  0  . aspirin EC 81 MG tablet Take 81 mg by mouth daily.        Marland Kitchen atorvastatin (LIPITOR) 40 MG tablet TAKE 1 TABLET BY MOUTH EVERY DAY  90 tablet  1  . ciprofloxacin (CIPRO) 250 MG tablet Take one tablet by mouth twice a day x 7 days.  Josephine  tablet  0  . clopidogrel (PLAVIX) 75 MG tablet TAKE 1 TABLET DAILY  90 tablet  1  . gabapentin (NEURONTIN) 800 MG tablet 1 capsule by mouth in AM and 2 capsules by mouth in PM. 1 capsule by mouth in AM and 2 capsules by mouth in PM.  180 tablet  0  . levothyroxine (SYNTHROID, LEVOTHROID) 25 MCG tablet TAKE 1 TABLET BY MOUTH EVERY MORNING  90 tablet  1  . Levothyroxine Sodium 25 MCG CAPS Take 1 capsule (25 mcg total) by mouth daily.  30 capsule  5  . lidocaine (LINDAMANTLE) 3 % CREA cream Apply 1 application topically as needed.        Marland Kitchen lisinopril (PRINIVIL,ZESTRIL) 10 MG tablet TAKE 2 TABLETS (20 MG TOTAL) BY MOUTH DAILY.  60 tablet  5  . metoprolol tartrate (LOPRESSOR) 25 MG tablet TAKE 1/2 TABLET (=12.5 MG) DAILY  45 tablet  1  . nitroGLYCERIN (NITROSTAT) 0.4 MG SL tablet Place 0.4 mg under Joy tongue every 5 (five) minutes as needed.        .  ranitidine (ZANTAC) 300 MG tablet TAKE 1 TABLET (300 MG TOTAL) BY MOUTH AT BEDTIME.  90 tablet  1  . ranitidine (ZANTAC) 300 MG tablet TAKE 1 TABLET (300 MG TOTAL) BY MOUTH AT BEDTIME.  90 tablet  1  . sertraline (ZOLOFT) 100 MG tablet TAKE 1 TABLET BY MOUTH EVERY DAY  30 tablet  2  . SPIRIVA HANDIHALER 18 MCG inhalation capsule PLACE 1 CAPSULE (18 MCG TOTAL) INTO INHALER AND INHALE DAILY.  1 each  3  . terconazole (TERAZOL 7) 0.4 % vaginal cream Place 1 applicator vaginally at bedtime.  45 g  0  . traZODone (DESYREL) 50 MG tablet TAKE 0.5-1 TABLETS (25-50 MG TOTAL) BY MOUTH AT BEDTIME AS NEEDED FOR SLEEP.  30 tablet  3   No current facility-administered medications on file prior to visit.   Joy PMH, PSH, Social History, Family History, Medications, and allergies have been reviewed in [facility], and have been updated if relevant.     ROS: See HPI No dysuria.  Physical exam: BP 120/50  Pulse 60  Temp(Src) 98.2 F (36.8 C)  Wt 199 lb (90.266 kg)  BMI 34.69 kg/m2   General:  Well-developed,well-nourished,in no acute distress; alert,appropriate and cooperative throughout examination Head:  normocephalic and atraumatic.   Eyes:  vision grossly intact, pupils equal, pupils round, and pupils reactive to light.   Ears:  R ear normal and L ear normal.   Nose:  no external deformity, pos O2 per [place] Mouth:  good dentition Lungs:  Normal respiratory effort, chest expands symmetrically.  Heart:  Normal rate and regular rhythm. S1 and S2 normal without gallop, murmur, click, rub or other extra sounds. Skin:  Intact without suspicious lesions or rashes Psych:  Cognition and judgment appear intact. Alert and cooperative with normal attention span and concentration. No apparent delusions, illusions, hallucinations  Assessment and Plan:  1. Altered mental status Resolved.  Likely due to overmedication.  2. HTN (hypertension) Now with episodes of hypotension and bradycardia.  Will decrease  Metoprolol to 12.5 mg daily and ask Caryl Pina, Therapist, sports at [facility] to check her BP in 1 week.  3. GERD (gastroesophageal reflux disease) Improved.  D/c prilosec.  Continue Xantac.

## 2012-09-06 NOTE — Patient Instructions (Addendum)
Good to see you. Let's decrease your metoprolol to 12.5 mg every evening. Continue amlodipine 5 mg daily.  Let's continue the ranitidine.  Ok to stop the omeprazole for now.    Please follow up with me in 2 weeks.

## 2012-09-07 DIAGNOSIS — H40009 Preglaucoma, unspecified, unspecified eye: Secondary | ICD-10-CM | POA: Diagnosis not present

## 2012-09-14 ENCOUNTER — Telehealth: Payer: Self-pay | Admitting: Family Medicine

## 2012-09-14 MED ORDER — SILVER SULFADIAZINE 1 % EX CREA: 1.0000 "application " | TOPICAL_CREAM | Freq: Every day | CUTANEOUS | Status: DC

## 2012-09-14 NOTE — Telephone Encounter (Signed)
Patient Information:  Caller Name: [name]  Phone: [phone]  Patient: Joy [name]  Gender: Female  DOB: [birth year]  Age: 79 Years  PCP: Arnette Norris [facility])  Office Follow Up:  Does the office need to follow up with this patient?: Yes  Instructions For The Office: appt needs krs/can  RN Note:  Patient had been treated while in hospital for impaction, and the medication had given her loose stools.  States she began complaining of her "bottom hurting," and on examination by the [facility] staff, was found to have skin breakdown/raw area from the labia back to the rectal area.  Afebrile.  Per rectal symptoms protocol, advised appt today due to appearance of infection/drainage, spreading redness; no appts available per Epic for 09/14/12.  TC to office for possibility of appt late 09/14/12; per staff, to send note via Epic for MD review.  Info to office.  May reach family at [phone] cell or [phone] ([employer] [place]) work.  krs/can  Symptoms  Reason For Call & Symptoms: rectal symptoms/rectal skin breakdown peri area  Reviewed Health History In EMR: Yes  Reviewed Medications In EMR: Yes  Reviewed Allergies In EMR: Yes  Reviewed Surgeries / Procedures: Yes  Date of Onset of Symptoms: 09/14/2012  Guideline(s) Used:  Rectal Symptoms  Disposition Per Guideline:   See Today in Office  Reason For Disposition Reached:   Rectal area looks infected (e.g., draining sore, spreading redness)  Advice Given:  N/A  Patient Will Follow Care Advice:  YES

## 2012-09-14 NOTE — Telephone Encounter (Signed)
Joy [name] (daughter) advised.  Medication phoned to pharmacy.

## 2012-09-14 NOTE — Telephone Encounter (Signed)
Please call them I would recommend silvadene cream on the sore spots once a day  It would be a good idea for Dr Deborra Medina to check the areas soon---especially if it is any worse today   Okay 60gm x 0

## 2012-09-15 ENCOUNTER — Ambulatory Visit (INDEPENDENT_AMBULATORY_CARE_PROVIDER_SITE_OTHER): Payer: Medicare Other | Admitting: Family Medicine

## 2012-09-15 ENCOUNTER — Ambulatory Visit: Payer: Self-pay | Admitting: Family Medicine

## 2012-09-15 ENCOUNTER — Encounter: Payer: Self-pay | Admitting: Family Medicine

## 2012-09-15 VITALS — BP 110/40 | HR 76 | Temp 98.0°F | Wt 200.0 lb

## 2012-09-15 DIAGNOSIS — R21 Rash and other nonspecific skin eruption: Secondary | ICD-10-CM

## 2012-09-15 DIAGNOSIS — N9089 Other specified noninflammatory disorders of vulva and perineum: Secondary | ICD-10-CM | POA: Insufficient documentation

## 2012-09-15 MED ORDER — NYSTATIN-TRIAMCINOLONE 100000-0.1 UNIT/GM-% EX OINT: TOPICAL_OINTMENT | Freq: Two times a day (BID) | CUTANEOUS | Status: DC

## 2012-09-15 NOTE — Progress Notes (Signed)
Subjective:    Patient ID: Joy [name], female    DOB: [birth year], 79 y.o.   MRN: [id number]  HPI  Very pleasant 79 yo female here with her daughter for ? Yeast infection.  Since she has been home from SNF, vulva is very irritated.  No dysuria, itching or vaginal discharge.  Has also not had a BM.  No fevers.  Patient Active Problem List   Diagnosis Date Noted  . Vulvar irritation 09/15/2012  . Altered mental status 09/06/2012  . GERD (gastroesophageal reflux disease) 09/06/2012  . Acute confusional state 08/03/2012  . Dizziness and giddiness 05/25/2012  . Abnormality of gait 05/25/2012  . Low back pain 03/30/2012  . Pain, coccyx 02/29/2012  . Neuropathy 01/14/2011  . Insomnia 01/14/2011  . 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 HTN (hypertension)   . COPD (chronic obstructive pulmonary disease)   . Depression   . Thyroid disease    Past Medical History  Diagnosis Date  . HTN (hypertension)   . COPD (chronic obstructive pulmonary disease)   . Depression   . Thyroid disease   . HLD (hyperlipidemia)   . CAD (coronary artery disease)     h/o NSTEMI, LAD stent placement 10/04, cath 05/11/2010  . PMR (polymyalgia rheumatica)     with h/o steroid induced myopathy  . Anxiety   . Diastolic dysfunction     Echo 05/11/2010, EF 65-70%  . TIA (transient ischemic attack)     11/05 s/p L CEA   Past Surgical History  Procedure Laterality Date  . Cholecystectomy    . Carotid endarterectomy      left with stent placement 11/2003  . Iliac artery stent      right, 2001   History  Substance Use Topics  . Smoking status: Former Smoker    Quit date: 01/05/1996  . Smokeless tobacco: Never Used  . Alcohol Use: No   No family history on file. Allergies  Allergen Reactions  . Codeine Other (See Comments)    Coma for 2 days  . Prednisone Other (See Comments)    Weight gain,puffy face   Current Outpatient  Prescriptions on File Prior to Visit  Medication Sig Dispense Refill  . ADVAIR DISKUS 250-50 MCG/DOSE AEPB INHALE 1 PUFF INTO THE LUNGS 2 (TWO) TIMES DAILY.  60 each  5  . albuterol (PROVENTIL HFA;VENTOLIN HFA) 108 (90 BASE) MCG/ACT inhaler Inhale 2 puffs into the lungs every 6 (six) hours as needed for wheezing.  1 Inhaler  0  . amLODipine (NORVASC) 5 MG tablet Take 1 tablet (5 mg total) by mouth daily.  30 tablet  5  . aspirin EC 81 MG tablet Take 81 mg by mouth daily.        Marland Kitchen atorvastatin (LIPITOR) 40 MG tablet TAKE 1 TABLET BY MOUTH EVERY DAY  90 tablet  1  . clopidogrel (PLAVIX) 75 MG tablet TAKE 1 TABLET DAILY  90 tablet  1  . gabapentin (NEURONTIN) 800 MG tablet 1 capsule by mouth in AM and 2 capsules by mouth in PM. 1 capsule by mouth in AM and 2 capsules by mouth in PM.  180 tablet  0  . levothyroxine (SYNTHROID, LEVOTHROID) 25 MCG tablet TAKE 1 TABLET BY MOUTH EVERY MORNING  90 tablet  1  . lidocaine (LINDAMANTLE) 3 % CREA cream Apply 1 application topically as needed.        Marland Kitchen lisinopril (PRINIVIL,ZESTRIL) 10 MG tablet TAKE 2 TABLETS (20 MG TOTAL)  BY MOUTH DAILY.  60 tablet  5  . metoprolol tartrate (LOPRESSOR) 12.5 mg TABS tablet Take 0.5 tablets (12.5 mg total) by mouth daily.  30 tablet  3  . nitroGLYCERIN (NITROSTAT) 0.4 MG SL tablet Place 0.4 mg under the tongue every 5 (five) minutes as needed.        . ranitidine (ZANTAC) 300 MG tablet TAKE 1 TABLET (300 MG TOTAL) BY MOUTH AT BEDTIME.  90 tablet  1  . sertraline (ZOLOFT) 100 MG tablet TAKE 1 TABLET BY MOUTH EVERY DAY  30 tablet  2  . silver sulfADIAZINE (SILVADENE) 1 % cream Apply 1 application topically daily.  50 g  0  . SPIRIVA HANDIHALER 18 MCG inhalation capsule PLACE 1 CAPSULE (18 MCG TOTAL) INTO INHALER AND INHALE DAILY.  1 each  3  . traZODone (DESYREL) 50 MG tablet TAKE 0.5-1 TABLETS (25-50 MG TOTAL) BY MOUTH AT BEDTIME AS NEEDED FOR SLEEP.  30 tablet  3   No current facility-administered medications on file prior to  visit.   The PMH, PSH, Social History, Family History, Medications, and allergies have been reviewed in [facility], and have been updated if relevant.    Review of Systems See HPI    Objective:   Physical Exam BP 110/40  Pulse 76  Temp(Src) 98 F (36.7 C)  Wt 200 lb (90.719 kg)  BMI 34.87 kg/m2 Gen:  Alert, pleasant, NAD GU:  No irritation of the vulva or discharge noted (see rectal) Rectal:  Diffuse bilateral erythema and scabbing of bilateral gluteal folds    Assessment & Plan:  1. Vulvar irritation No noted abnormality.  Wet prep neg.  2.  Rash- Will treat with mycolog.  Has pred allergy but has tolerated topical and inhaled steroids in past. Follow up with me on Monday (she will call me).

## 2012-09-15 NOTE — Patient Instructions (Addendum)
Good to see you. Please use Mycolog as directed- apply twice daily until resolution (no longer than 10 days) and call me with an update. Also use barrier cream like A and D. Please do not apply mycolog to vaginal area.

## 2012-09-21 ENCOUNTER — Ambulatory Visit: Payer: Self-pay | Admitting: Family Medicine

## 2012-10-01 ENCOUNTER — Other Ambulatory Visit: Payer: Self-pay | Admitting: Family Medicine

## 2012-10-02 NOTE — Telephone Encounter (Signed)
Received refill request electronically from pharmacy. Last office visit 09/15/12. Is it okay to refill medication?

## 2012-10-19 ENCOUNTER — Telehealth: Payer: Self-pay | Admitting: Family Medicine

## 2012-10-19 NOTE — Telephone Encounter (Signed)
[name]/Child called and left message to return call for triage d/t "patient has had a headache for days."  Attempted call back.  No answer.  VM left to return call if they still require our assistance.

## 2012-10-22 ENCOUNTER — Other Ambulatory Visit: Payer: Self-pay | Admitting: Family Medicine

## 2012-10-24 ENCOUNTER — Other Ambulatory Visit: Payer: Self-pay | Admitting: *Deleted

## 2012-10-24 MED ORDER — SERTRALINE HCL 100 MG PO TABS: 100.0000 mg | ORAL_TABLET | Freq: Every day | ORAL | Status: DC

## 2012-10-24 MED ORDER — TIOTROPIUM BROMIDE MONOHYDRATE 18 MCG IN CAPS: 18.0000 ug | ORAL_CAPSULE | Freq: Every day | RESPIRATORY_TRACT | Status: DC

## 2012-10-24 MED ORDER — TIOTROPIUM BROMIDE MONOHYDRATE 18 MCG IN CAPS
18.0000 ug | ORAL_CAPSULE | Freq: Every day | RESPIRATORY_TRACT | Status: DC
Start: 2012-10-24 — End: 2013-11-12

## 2012-10-24 NOTE — Telephone Encounter (Signed)
Received faxed refill request. Refill sent to pharmacy electronically.  The script for Spiriva was sent to [facility] by mistake. Called and spoke to [place] and she cancelled the order.  Script then [facility]

## 2012-10-25 DIAGNOSIS — H4010X0 Unspecified open-angle glaucoma, stage unspecified: Secondary | ICD-10-CM | POA: Diagnosis not present

## 2012-10-26 ENCOUNTER — Other Ambulatory Visit: Payer: Self-pay | Admitting: *Deleted

## 2012-10-26 MED ORDER — AMLODIPINE BESYLATE 5 MG PO TABS: 5.0000 mg | ORAL_TABLET | Freq: Every day | ORAL | Status: DC

## 2012-10-26 MED ORDER — METOPROLOL TARTRATE 25 MG PO TABS: ORAL_TABLET | ORAL | Status: DC

## 2012-10-26 MED ORDER — METOPROLOL TARTRATE 12.5 MG HALF TABLET: 12.5000 mg | ORAL_TABLET | Freq: Every day | ORAL | Status: DC

## 2012-11-03 DIAGNOSIS — Z23 Encounter for immunization: Secondary | ICD-10-CM | POA: Diagnosis not present

## 2012-11-04 ENCOUNTER — Other Ambulatory Visit: Payer: Self-pay | Admitting: Family Medicine

## 2012-11-05 NOTE — Telephone Encounter (Signed)
Last OV 09/15/2012. Ok to refill?

## 2012-11-06 NOTE — Telephone Encounter (Signed)
It looks like this was just filled on 10/07/2012 with 3 refills. I think it is too soon for a refill. She should have some at the pharmacy.

## 2012-11-07 DIAGNOSIS — I517 Cardiomegaly: Secondary | ICD-10-CM | POA: Diagnosis not present

## 2012-11-07 DIAGNOSIS — R918 Other nonspecific abnormal finding of lung field: Secondary | ICD-10-CM | POA: Diagnosis not present

## 2012-11-07 DIAGNOSIS — R404 Transient alteration of awareness: Secondary | ICD-10-CM | POA: Diagnosis not present

## 2012-11-07 DIAGNOSIS — R0609 Other forms of dyspnea: Secondary | ICD-10-CM | POA: Diagnosis not present

## 2012-11-07 DIAGNOSIS — N390 Urinary tract infection, site not specified: Secondary | ICD-10-CM | POA: Diagnosis not present

## 2012-11-07 DIAGNOSIS — R4182 Altered mental status, unspecified: Secondary | ICD-10-CM | POA: Diagnosis not present

## 2012-11-08 DIAGNOSIS — R110 Nausea: Secondary | ICD-10-CM | POA: Diagnosis present

## 2012-11-08 DIAGNOSIS — J449 Chronic obstructive pulmonary disease, unspecified: Secondary | ICD-10-CM | POA: Diagnosis not present

## 2012-11-08 DIAGNOSIS — R222 Localized swelling, mass and lump, trunk: Secondary | ICD-10-CM | POA: Diagnosis present

## 2012-11-08 DIAGNOSIS — R269 Unspecified abnormalities of gait and mobility: Secondary | ICD-10-CM | POA: Diagnosis present

## 2012-11-08 DIAGNOSIS — R404 Transient alteration of awareness: Secondary | ICD-10-CM | POA: Diagnosis not present

## 2012-11-08 DIAGNOSIS — N390 Urinary tract infection, site not specified: Secondary | ICD-10-CM | POA: Diagnosis not present

## 2012-11-08 DIAGNOSIS — I517 Cardiomegaly: Secondary | ICD-10-CM | POA: Diagnosis not present

## 2012-11-08 DIAGNOSIS — J9610 Chronic respiratory failure, unspecified whether with hypoxia or hypercapnia: Secondary | ICD-10-CM | POA: Diagnosis not present

## 2012-11-08 DIAGNOSIS — R918 Other nonspecific abnormal finding of lung field: Secondary | ICD-10-CM | POA: Diagnosis not present

## 2012-11-08 DIAGNOSIS — M810 Age-related osteoporosis without current pathological fracture: Secondary | ICD-10-CM | POA: Diagnosis present

## 2012-11-08 DIAGNOSIS — I2510 Atherosclerotic heart disease of native coronary artery without angina pectoris: Secondary | ICD-10-CM | POA: Diagnosis present

## 2012-11-08 DIAGNOSIS — R5381 Other malaise: Secondary | ICD-10-CM | POA: Diagnosis present

## 2012-11-08 DIAGNOSIS — Z9981 Dependence on supplemental oxygen: Secondary | ICD-10-CM | POA: Diagnosis not present

## 2012-11-08 DIAGNOSIS — E785 Hyperlipidemia, unspecified: Secondary | ICD-10-CM | POA: Diagnosis present

## 2012-11-08 DIAGNOSIS — I10 Essential (primary) hypertension: Secondary | ICD-10-CM | POA: Diagnosis not present

## 2012-11-08 DIAGNOSIS — R413 Other amnesia: Secondary | ICD-10-CM | POA: Diagnosis present

## 2012-11-08 DIAGNOSIS — E039 Hypothyroidism, unspecified: Secondary | ICD-10-CM | POA: Diagnosis present

## 2012-11-08 DIAGNOSIS — F05 Delirium due to known physiological condition: Secondary | ICD-10-CM | POA: Diagnosis not present

## 2012-11-08 DIAGNOSIS — Z8673 Personal history of transient ischemic attack (TIA), and cerebral infarction without residual deficits: Secondary | ICD-10-CM | POA: Diagnosis not present

## 2012-11-08 DIAGNOSIS — R4182 Altered mental status, unspecified: Secondary | ICD-10-CM | POA: Diagnosis not present

## 2012-11-08 DIAGNOSIS — R0609 Other forms of dyspnea: Secondary | ICD-10-CM | POA: Diagnosis not present

## 2012-11-11 ENCOUNTER — Other Ambulatory Visit: Payer: Self-pay | Admitting: Family Medicine

## 2012-11-12 NOTE — Telephone Encounter (Signed)
Last office visit 09/15/2012.  Ok to refill?

## 2012-11-13 NOTE — Telephone Encounter (Signed)
Called to [facility]

## 2012-11-18 ENCOUNTER — Other Ambulatory Visit: Payer: Self-pay | Admitting: Family Medicine

## 2012-11-20 DIAGNOSIS — I517 Cardiomegaly: Secondary | ICD-10-CM | POA: Diagnosis not present

## 2012-11-20 DIAGNOSIS — J189 Pneumonia, unspecified organism: Secondary | ICD-10-CM | POA: Diagnosis not present

## 2012-11-20 DIAGNOSIS — R222 Localized swelling, mass and lump, trunk: Secondary | ICD-10-CM | POA: Diagnosis not present

## 2012-11-20 DIAGNOSIS — IMO0002 Reserved for concepts with insufficient information to code with codable children: Secondary | ICD-10-CM | POA: Diagnosis not present

## 2012-11-20 DIAGNOSIS — J9383 Other pneumothorax: Secondary | ICD-10-CM | POA: Diagnosis not present

## 2012-11-20 DIAGNOSIS — R918 Other nonspecific abnormal finding of lung field: Secondary | ICD-10-CM | POA: Diagnosis not present

## 2012-11-21 ENCOUNTER — Ambulatory Visit (INDEPENDENT_AMBULATORY_CARE_PROVIDER_SITE_OTHER): Payer: Medicare Other | Admitting: Internal Medicine

## 2012-11-21 ENCOUNTER — Encounter: Payer: Self-pay | Admitting: Internal Medicine

## 2012-11-21 VITALS — BP 112/64 | HR 69 | Temp 97.7°F | Wt 199.8 lb

## 2012-11-21 DIAGNOSIS — R911 Solitary pulmonary nodule: Secondary | ICD-10-CM

## 2012-11-21 DIAGNOSIS — R630 Anorexia: Secondary | ICD-10-CM

## 2012-11-21 DIAGNOSIS — R5381 Other malaise: Secondary | ICD-10-CM

## 2012-11-21 DIAGNOSIS — R3915 Urgency of urination: Secondary | ICD-10-CM

## 2012-11-21 MED ORDER — LEVOFLOXACIN 500 MG PO TABS: 500.0000 mg | ORAL_TABLET | Freq: Every day | ORAL | Status: DC

## 2012-11-21 NOTE — Progress Notes (Signed)
Subjective:    Patient ID: Joy [name], female    DOB: Nov 08, 1930, 79 y.o.   MRN: [id number]  HPI  Pt presents to the clinic today for hospital followup. She was discharged from [place] on 11/11/2012 for confusion and elevated blood pressure She was found to have a UTI, lung nodule on CT scan concerning for malignancy, hypertension and a lactic acidosis. She was treated for her UTI and once she stabilized, she was d/c home. She went back yesterday for her CT guided biopsy. sShe does c/o recurrent UTI symptoms. She is sleeping well but her appetite and energy level are not that good. The radiologist told her it would take about 1 week for the biopsy results to come back.  Review of Systems      Past Medical History  Diagnosis Date  . HTN (hypertension)   . COPD (chronic obstructive pulmonary disease)   . Depression   . Thyroid disease   . HLD (hyperlipidemia)   . CAD (coronary artery disease)     h/o NSTEMI, LAD stent placement 10/04, cath 05/11/2010  . PMR (polymyalgia rheumatica)     with h/o steroid induced myopathy  . Anxiety   . Diastolic dysfunction     Echo 05/11/2010, EF 65-70%  . TIA (transient ischemic attack)     11/05 s/p L CEA    Current Outpatient Prescriptions  Medication Sig Dispense Refill  . ADVAIR DISKUS 250-50 MCG/DOSE AEPB INHALE 1 PUFF INTO THE LUNGS 2 (TWO) TIMES DAILY.  60 each  5  . albuterol (PROVENTIL HFA;VENTOLIN HFA) 108 (90 BASE) MCG/ACT inhaler Inhale 2 puffs into the lungs every 6 (six) hours as needed for wheezing.  1 Inhaler  0  . amLODipine (NORVASC) 5 MG tablet Take 1 tablet (5 mg total) by mouth daily.  90 tablet  1  . aspirin EC 81 MG tablet Take 81 mg by mouth daily.        Marland Kitchen atorvastatin (LIPITOR) 40 MG tablet TAKE 1 TABLET BY MOUTH EVERY DAY  90 tablet  1  . clopidogrel (PLAVIX) 75 MG tablet TAKE 1 TABLET DAILY  90 tablet  1  . gabapentin (NEURONTIN) 800 MG tablet 1 capsule by mouth in AM and 2 capsules by mouth in PM. 1 capsule by mouth in  AM and 2 capsules by mouth in PM.  180 tablet  0  . gabapentin (NEURONTIN) 800 MG tablet TAKE 1 CAPSULE EVERY       MORNING & 2 CAPSULES EVERY EVENING  180 tablet  3  . levothyroxine (SYNTHROID, LEVOTHROID) 25 MCG tablet TAKE 1 TABLET BY MOUTH EVERY MORNING  90 tablet  1  . lidocaine (LINDAMANTLE) 3 % CREA cream Apply 1 application topically as needed.        Marland Kitchen lisinopril (PRINIVIL,ZESTRIL) 10 MG tablet TAKE 2 TABLETS (20 MG TOTAL) BY MOUTH DAILY.  60 tablet  5  . metoprolol tartrate (LOPRESSOR) 25 MG tablet Take one half tablet (12.5 mg) daily.  45 tablet  3  . nitroGLYCERIN (NITROSTAT) 0.4 MG SL tablet Place 0.4 mg under the tongue every 5 (five) minutes as needed.        . nystatin-triamcinolone ointment (MYCOLOG) Apply topically 2 (two) times daily.  30 g  0  . ranitidine (ZANTAC) 300 MG tablet TAKE 1 TABLET (300 MG TOTAL) BY MOUTH AT BEDTIME.  90 tablet  1  . sertraline (ZOLOFT) 100 MG tablet Take 1 tablet (100 mg total) by mouth daily.  90 tablet  0  .  silver sulfADIAZINE (SILVADENE) 1 % cream Apply 1 application topically daily.  50 g  0  . tiotropium (SPIRIVA HANDIHALER) 18 MCG inhalation capsule Place 1 capsule (18 mcg total) into inhaler and inhale daily.  90 capsule  0  . tiotropium (SPIRIVA HANDIHALER) 18 MCG inhalation capsule Place 1 capsule (18 mcg total) into inhaler and inhale daily.  90 capsule  1  . traZODone (DESYREL) 50 MG tablet TAKE 1/2 - 1 TABLETS (25-50 MG TOTAL) BY MOUTH AT BEDTIME AS NEEDED FOR SLEEP.  30 tablet  3   No current facility-administered medications for this visit.    Allergies  Allergen Reactions  . Codeine Other (See Comments)    Coma for 2 days  . Prednisone Other (See Comments)    Weight gain,puffy face    No family history on file.  History   Social History  . Marital Status: Married    Spouse Name: N/A    Number of Children: N/A  . Years of Education: N/A   Occupational History  . retired     Pharmacist, hospital   Social History Main Topics  .  Smoking status: Former Smoker    Quit date: 01/05/1996  . Smokeless tobacco: Never Used  . Alcohol Use: No  . Drug Use: No  . Sexual Activity: Not on file   Other Topics Concern  . Not on file   Social History Narrative   Lives with husband, Joy [name] at [place].   Daughters, Joy [name] and Joy [name] very involved.     Constitutional: Pt reports fatigue. Denies fever, malaise,headache or abrupt weight changes.  Respiratory: Pt reports shortness of breath. Denies difficulty breathing, cough or sputum production.   Cardiovascular: Denies chest pain, chest tightness, palpitations or swelling in the hands or feet.  GU: Pt reports urgency and perineal irritation. Denies frequency, pain with urination, blood in urine, odor or discharge. Neurological: Denies dizziness, difficulty with memory, difficulty with speech or problems with balance and coordination.   No other specific complaints in a complete review of systems (except as listed in HPI above).  Objective:   Physical Exam   BP 112/64  Pulse 69  Temp(Src) 97.7 F (36.5 C) (Oral)  Wt 199 lb 12 oz (90.606 kg)  SpO2 91% Wt Readings from Last 3 Encounters:  11/21/12 199 lb 12 oz (90.606 kg)  09/15/12 200 lb (90.719 kg)  09/06/12 199 lb (90.266 kg)    General: Appears her stated age, chronically ill appearing in NAD. Cardiovascular: Normal rate and rhythm. S1,S2 noted.  No murmur, rubs or gallops noted. No JVD or BLE edema. No carotid bruits noted. Pulmonary/Chest: Normal effort and positive vesicular breath sounds although fibrotic sounding. No respiratory distress. No wheezes, rales or ronchi noted. Biopsy site CDI. Neurological: Alert and oriented. Cranial nerves II-XII intact. Coordination normal. +DTRs bilaterally.  BMET    Component Value Date/Time   NA 143 05/25/2012 1047   K 4.4 05/25/2012 1047   CL 105 05/25/2012 1047   CO2 32 05/25/2012 1047   GLUCOSE 127* 05/25/2012 1047   BUN 19 05/25/2012 1047   CREATININE 1.2 05/25/2012  1047   CALCIUM 8.8 05/25/2012 1047    Lipid Panel     Component Value Date/Time   CHOL 287* 01/06/2010   TRIG 289* 01/06/2010   HDL 39 01/06/2010   LDLCALC 190 01/06/2010    CBC    Component Value Date/Time   WBC 10.5 05/25/2012 1047   RBC 4.26 05/25/2012 1047   HGB 12.0  05/25/2012 1047   HCT 36.2 05/25/2012 1047   PLT 221.0 05/25/2012 1047   MCV 85.0 05/25/2012 1047   MCHC 33.1 05/25/2012 1047   RDW 15.8* 05/25/2012 1047   LYMPHSABS 1.2 05/25/2012 1047   MONOABS 1.0 05/25/2012 1047   EOSABS 0.2 05/25/2012 1047   BASOSABS 0.0 05/25/2012 1047    Hgb A1C No results found for this basename: HGBA1C        Assessment & Plan:   Ongoing UTI symptoms- will recheck urinalysis today (pt unable to urinate today) Will treat with Levaquin given symptoms. Biopsy site looks good- ok to take off bandage and wash with warm water and soap Try drinking boost even if you don't feel like eating, to add some calories and nutrition to give you energy Lets wait for the biopsy results before we decide on any other interventions at this time  Reviewed radiology studies, hospital notes and labs. Time to review all information approx 15 minutes.

## 2012-11-21 NOTE — Patient Instructions (Signed)
Urinary Tract Infection  Urinary tract infections (UTIs) can develop anywhere along your urinary tract. Your urinary tract is your body's drainage system for removing wastes and extra water. Your urinary tract includes two kidneys, two ureters, a bladder, and a urethra. Your kidneys are a pair of bean-shaped organs. Each kidney is about the size of your fist. They are located below your ribs, one on each side of your spine.  CAUSES  Infections are caused by microbes, which are microscopic organisms, including fungi, viruses, and bacteria. These organisms are so small that they can only be seen through a microscope. Bacteria are the microbes that most commonly cause UTIs.  SYMPTOMS   Symptoms of UTIs may vary by age and gender of the patient and by the location of the infection. Symptoms in young women typically include a frequent and intense urge to urinate and a painful, burning feeling in the bladder or urethra during urination. Older women and men are more likely to be tired, shaky, and weak and have muscle aches and abdominal pain. A fever may mean the infection is in your kidneys. Other symptoms of a kidney infection include pain in your back or sides below the ribs, nausea, and vomiting.  DIAGNOSIS  To diagnose a UTI, your caregiver will ask you about your symptoms. Your caregiver also will ask to provide a urine sample. The urine sample will be tested for bacteria and white blood cells. White blood cells are made by your body to help fight infection.  TREATMENT   Typically, UTIs can be treated with medication. Because most UTIs are caused by a bacterial infection, they usually can be treated with the use of antibiotics. The choice of antibiotic and length of treatment depend on your symptoms and the type of bacteria causing your infection.  HOME CARE INSTRUCTIONS   If you were prescribed antibiotics, take them exactly as your caregiver instructs you. Finish the medication even if you feel better after you  have only taken some of the medication.   Drink enough water and fluids to keep your urine clear or pale yellow.   Avoid caffeine, tea, and carbonated beverages. They tend to irritate your bladder.   Empty your bladder often. Avoid holding urine for long periods of time.   Empty your bladder before and after sexual intercourse.   After a bowel movement, women should cleanse from front to back. Use each tissue only once.  SEEK MEDICAL CARE IF:    You have back pain.   You develop a fever.   Your symptoms do not begin to resolve within 3 days.  SEEK IMMEDIATE MEDICAL CARE IF:    You have severe back pain or lower abdominal pain.   You develop chills.   You have nausea or vomiting.   You have continued burning or discomfort with urination.  MAKE SURE YOU:    Understand these instructions.   Will watch your condition.   Will get help right away if you are not doing well or get worse.  Document Released: 09/30/2004 Document Revised: 06/22/2011 Document Reviewed: 01/29/2011  [employer] Patient Information 2014 [employer].

## 2012-11-22 NOTE — Telephone Encounter (Signed)
Joy [name] pts daughter called to request refill trazodone. Spoke with Cherise at [facility] and advised # 30 Trazodone picked up on 11/19/12. Joy [name] notified and she said her sister asked her to call for refill because she is the one that takes care of pts med. Joy [name] will talk with her sister.

## 2012-11-24 ENCOUNTER — Telehealth: Payer: Self-pay

## 2012-11-24 NOTE — Telephone Encounter (Signed)
[facility] will contact her about her biopsy when the results are available. They have not yet sent me a copy

## 2012-11-24 NOTE — Telephone Encounter (Signed)
[name] pts daughter said pt was seen by [name] NP on 11/21/12; pt had a biopsy at [facility] on 11/20/12 and was told results would be available in 72 hours; pt had been in [place] for UTI when a spot was found on pts lung. Pt told [name] she signed release for Rollene Fare to get biopsy report and pts daughter wants cb; was request for biopsy info sent to [facility] if so when to expect cb about biopsy report. [name] is not sure who to contact about biopsy report; [facility] or [name] NP. [name] request cb.

## 2012-11-24 NOTE — Telephone Encounter (Signed)
[name] notified as instructed and she has already left v/m for [facility] dr to call her back.

## 2012-11-25 ENCOUNTER — Other Ambulatory Visit: Payer: Self-pay | Admitting: Family Medicine

## 2012-11-25 ENCOUNTER — Other Ambulatory Visit: Payer: Self-pay | Admitting: Internal Medicine

## 2012-11-26 NOTE — Telephone Encounter (Signed)
Last office visit 11/21/2012.  Ok to refill?

## 2012-11-27 NOTE — Telephone Encounter (Signed)
Ok to refill 

## 2012-12-01 ENCOUNTER — Encounter: Payer: Self-pay | Admitting: Family Medicine

## 2012-12-01 ENCOUNTER — Telehealth: Payer: Self-pay | Admitting: *Deleted

## 2012-12-01 ENCOUNTER — Ambulatory Visit (INDEPENDENT_AMBULATORY_CARE_PROVIDER_SITE_OTHER): Payer: Medicare Other | Admitting: Family Medicine

## 2012-12-01 VITALS — BP 122/52 | HR 75 | Temp 98.3°F | Ht 63.5 in | Wt 206.5 lb

## 2012-12-01 DIAGNOSIS — B373 Candidiasis of vulva and vagina: Secondary | ICD-10-CM | POA: Diagnosis not present

## 2012-12-01 DIAGNOSIS — R300 Dysuria: Secondary | ICD-10-CM

## 2012-12-01 DIAGNOSIS — R32 Unspecified urinary incontinence: Secondary | ICD-10-CM | POA: Diagnosis not present

## 2012-12-01 DIAGNOSIS — B3749 Other urogenital candidiasis: Secondary | ICD-10-CM | POA: Insufficient documentation

## 2012-12-01 DIAGNOSIS — N9089 Other specified noninflammatory disorders of vulva and perineum: Secondary | ICD-10-CM

## 2012-12-01 LAB — POCT URINALYSIS DIPSTICK
Bilirubin, UA: NEGATIVE
Glucose, UA: NEGATIVE
Nitrite, UA: NEGATIVE
pH, UA: 6

## 2012-12-01 LAB — POCT UA - MICROSCOPIC ONLY
Casts, Ur, LPF, POC: 0
Yeast, UA: POSITIVE

## 2012-12-01 MED ORDER — FLUCONAZOLE 150 MG PO TABS: ORAL_TABLET | ORAL | Status: DC

## 2012-12-01 MED ORDER — NYSTATIN-TRIAMCINOLONE 100000-0.1 UNIT/GM-% EX OINT: TOPICAL_OINTMENT | Freq: Two times a day (BID) | CUTANEOUS | Status: DC

## 2012-12-01 NOTE — Assessment & Plan Note (Addendum)
Pt is s/p tx for several utis  Yeast in urine today Also vulvar symptoms  tx with diflucan doses times 2  Also mycolog for skin changes with close f/u

## 2012-12-01 NOTE — Assessment & Plan Note (Signed)
After several abx for uti  tx with diflucan times 2 doses Also mycolog topically   For irritation can use barrier cream on top of that  Long disc re: keeping area as clean and dry as possible  F/u re check 2-3wk

## 2012-12-01 NOTE — Assessment & Plan Note (Signed)
Due to both yeast and urine exp Disc hygiene/ keeping area dry/use of mycolog  Re check 2-3 wk or earlier if no imp  Lichen sclerosis is also in the differential

## 2012-12-01 NOTE — Telephone Encounter (Signed)
Pt's daughter calls stating that pt was hospitalized for a uti, and started c/o same symptoms after coming home. Symptoms. She saw [name] and was given an abx. Daughter says pt started complaining of vaginal burning, itching , and had also been applying a cream (prescribed to her in hospital for a deep wound on her buttock) to vaginal area for the itching. Daughter is unsure if that is due to a yeast infection or rxn to cream, but pt has also been urinating on herself for the past few days. I advised appt and scheduled her with Dr Glori Bickers today at 12:15.

## 2012-12-01 NOTE — Progress Notes (Signed)
Subjective:    Patient ID: Joy [name], female    DOB: [birth year], 79 y.o.   MRN: [id number]  HPI Here with urinary symptoms  Incontinence - she "does not know when she is going" - that has been going on a long time (? Over 2 months)  Also burning to urinate (? Coming from the skin)  No increased frequency - but has to stay on a bladder emptying routine  She does get some bladder pain  No nausea or fever   Has a "rash" in vulvar area -now there is skin breakdown  Thinks she may have yeast - because she has had 2 bouts of antibiotics for uti     Uses derma gran barrier cream-every time she cleans as well as another barrier cream called sensi care with zinc oxide   Used nystatin triamcinolone and that helped - until she ran out of it   Patient Active Problem List   Diagnosis Date Noted  . Candida UTI 12/01/2012  . Dysuria 12/01/2012  . Yeast vaginitis 12/01/2012  . Vulvar irritation 09/15/2012  . GERD (gastroesophageal reflux disease) 09/06/2012  . Neuropathy 01/14/2011  . Insomnia 01/14/2011  . 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 HTN (hypertension)   . COPD (chronic obstructive pulmonary disease)   . Depression   . Thyroid disease    Past Medical History  Diagnosis Date  . HTN (hypertension)   . COPD (chronic obstructive pulmonary disease)   . Depression   . Thyroid disease   . HLD (hyperlipidemia)   . CAD (coronary artery disease)     h/o NSTEMI, LAD stent placement 10/04, cath 05/11/2010  . PMR (polymyalgia rheumatica)     with h/o steroid induced myopathy  . Anxiety   . Diastolic dysfunction     Echo 05/11/2010, EF 65-70%  . TIA (transient ischemic attack)     11/05 s/p L CEA   Past Surgical History  Procedure Laterality Date  . Cholecystectomy    . Carotid endarterectomy      left with stent placement 11/2003  . Iliac artery stent      right, 2001   History  Substance Use Topics  . Smoking  status: Former Smoker    Quit date: 01/05/1996  . Smokeless tobacco: Never Used  . Alcohol Use: No   No family history on file. Allergies  Allergen Reactions  . Codeine Other (See Comments)    Coma for 2 days  . Prednisone Other (See Comments)    Weight gain,puffy face   Current Outpatient Prescriptions on File Prior to Visit  Medication Sig Dispense Refill  . ADVAIR DISKUS 250-50 MCG/DOSE AEPB INHALE 1 PUFF INTO THE LUNGS 2 (TWO) TIMES DAILY.  60 each  5  . albuterol (PROVENTIL HFA;VENTOLIN HFA) 108 (90 BASE) MCG/ACT inhaler Inhale 2 puffs into the lungs every 6 (six) hours as needed for wheezing.  1 Inhaler  0  . amLODipine (NORVASC) 5 MG tablet Take 1 tablet (5 mg total) by mouth daily.  90 tablet  1  . aspirin EC 81 MG tablet Take 81 mg by mouth daily.        Marland Kitchen atorvastatin (LIPITOR) 40 MG tablet TAKE 1 TABLET BY MOUTH EVERY DAY  90 tablet  1  . clopidogrel (PLAVIX) 75 MG tablet TAKE 1 TABLET DAILY  90 tablet  1  . gabapentin (NEURONTIN) 800 MG tablet 1 capsule by mouth in AM and 2 capsules by mouth in PM.  1 capsule by mouth in AM and 2 capsules by mouth in PM.  180 tablet  0  . gabapentin (NEURONTIN) 800 MG tablet TAKE 1 CAPSULE EVERY       MORNING & 2 CAPSULES EVERY EVENING  180 tablet  3  . levofloxacin (LEVAQUIN) 500 MG tablet Take 1 tablet (500 mg total) by mouth daily.  7 tablet  0  . levothyroxine (SYNTHROID, LEVOTHROID) 25 MCG tablet TAKE 1 TABLET BY MOUTH EVERY MORNING  90 tablet  1  . lidocaine (LINDAMANTLE) 3 % CREA cream Apply 1 application topically as needed.        Marland Kitchen lisinopril (PRINIVIL,ZESTRIL) 10 MG tablet TAKE 2 TABLETS (20 MG TOTAL) BY MOUTH DAILY.  60 tablet  5  . metoprolol tartrate (LOPRESSOR) 25 MG tablet Take one half tablet (12.5 mg) daily.  45 tablet  3  . nitroGLYCERIN (NITROSTAT) 0.4 MG SL tablet Place 0.4 mg under the tongue every 5 (five) minutes as needed.        . ranitidine (ZANTAC) 300 MG tablet TAKE 1 TABLET (300 MG TOTAL) BY MOUTH AT BEDTIME.  90  tablet  1  . sertraline (ZOLOFT) 100 MG tablet Take 1 tablet (100 mg total) by mouth daily.  90 tablet  0  . silver sulfADIAZINE (SILVADENE) 1 % cream Apply 1 application topically daily.  50 g  0  . tiotropium (SPIRIVA HANDIHALER) 18 MCG inhalation capsule Place 1 capsule (18 mcg total) into inhaler and inhale daily.  90 capsule  0  . tiotropium (SPIRIVA HANDIHALER) 18 MCG inhalation capsule Place 1 capsule (18 mcg total) into inhaler and inhale daily.  90 capsule  1  . traZODone (DESYREL) 50 MG tablet TAKE 1/2 - 1 TABLETS (25-50 MG TOTAL) BY MOUTH AT BEDTIME AS NEEDED FOR SLEEP.  30 tablet  3   No current facility-administered medications on file prior to visit.    Review of Systems    Review of Systems  Constitutional: Negative for fever, appetite change, fatigue and unexpected weight change.  Eyes: Negative for pain and visual disturbance.  Respiratory: Negative for cough and shortness of breath.   Cardiovascular: Negative for cp or palpitations    Gastrointestinal: Negative for nausea, diarrhea and constipation.  Genitourinary: pos  for urgency and frequency. pos for dysuria and incontinence (pos for recent utis tx at [facility]) Skin: Negative for pallor and pos for rash and skin breakdown in vulva area  Neurological: Negative for weakness, light-headedness, numbness and headaches.  Hematological: Negative for adenopathy. Does not bruise/bleed easily.  Psychiatric/Behavioral: Negative for dysphoric mood. The patient is not nervous/anxious.      Objective:   Physical Exam  Constitutional: She appears well-developed and well-nourished. No distress.  Frail appearing elderly female , overwt wearing 02 Pleasant   HENT:  Head: Normocephalic and atraumatic.  Eyes: Conjunctivae and EOM are normal. Pupils are equal, round, and reactive to light.  Neck: Normal range of motion. Neck supple.  Cardiovascular: Normal rate and regular rhythm.   Abdominal: Soft. Bowel sounds are normal. She  exhibits no distension and no mass. There is no hepatosplenomegaly. There is tenderness in the suprapubic area. There is no rebound, no guarding and no CVA tenderness.  Genitourinary:  Vaginal mucosa and labia are erythematous without edema  Some areas of maceration and lichenification No tears or skin breakdown and no bleeding  Mildly tender over labia minora No discharge Urine intcontinence noted   Lymphadenopathy:    She has no cervical adenopathy.  Neurological: She is alert. She has normal reflexes.  Skin: Skin is warm and dry. There is erythema. No pallor.  Psychiatric: She has a normal mood and affect.          Assessment & Plan:

## 2012-12-01 NOTE — Patient Instructions (Addendum)
I am sending a urine culture and we will call you with results  Also a yeast vaginal infection as well as some irritation from moisture/urine exposure Try to stay as clean and dry as you can  Use the mycolog cream as directed Take the diflucan  Skip your atorvastain (cholesterol medicine) the days you take the diflucan    Let's see what results look like and go from there  Either way follow up in about 2-3 weeks for a re check of the vulvar area

## 2012-12-01 NOTE — Assessment & Plan Note (Signed)
Urine cx pending Yeast seen on micro   This could also be due to vulvar irritation

## 2012-12-01 NOTE — Telephone Encounter (Signed)
I will see her then  

## 2012-12-03 ENCOUNTER — Other Ambulatory Visit: Payer: Self-pay | Admitting: Family Medicine

## 2012-12-03 LAB — URINE CULTURE

## 2012-12-07 ENCOUNTER — Telehealth: Payer: Self-pay

## 2012-12-07 MED ORDER — CIPROFLOXACIN HCL 250 MG PO TABS: 250.0000 mg | ORAL_TABLET | Freq: Two times a day (BID) | ORAL | Status: DC

## 2012-12-07 NOTE — Telephone Encounter (Signed)
[name] pts daughter said pt is still very sleepy and some memory issues. Also still having perineal itching and difficulty controlling urination. [name] concerned UTI may be involved. No fever, no abd or back pain. [facility].[name] request cb. [name] also wants Dr Glori Bickers to know pt was hospitalized at [facility] with UTI in October; med given at [facility] nauseated pt, [name] does not know name of med.

## 2012-12-07 NOTE — Telephone Encounter (Signed)
I will empirically cover with cipro-sent to pharmacy-and if urinary symptoms do not improve let me know  I think we should ref to gyn for the vaginal /penineal issues since not getting better - please let me know if agreeable

## 2012-12-08 MED ORDER — FLUCONAZOLE 150 MG PO TABS: ORAL_TABLET | ORAL | Status: DC

## 2012-12-08 NOTE — Telephone Encounter (Signed)
I sent one to her pharmacy Keep me updated please

## 2012-12-08 NOTE — Telephone Encounter (Signed)
Katie, pts daughter left v/m that pt usually has yeast problems after taking antibiotic and [name] said pt recently had diflucan but wants to know if pt needs another diflucan to [facility].

## 2012-12-08 NOTE — Telephone Encounter (Signed)
Left voicemail letting daughter know Rx sent to pharmacy but to call office so we can discuss referral to gyn

## 2012-12-11 DIAGNOSIS — R0602 Shortness of breath: Secondary | ICD-10-CM | POA: Diagnosis not present

## 2012-12-11 DIAGNOSIS — R222 Localized swelling, mass and lump, trunk: Secondary | ICD-10-CM | POA: Diagnosis not present

## 2012-12-11 LAB — PULMONARY FUNCTION TEST

## 2012-12-11 NOTE — Telephone Encounter (Signed)
Pt advised and states an understanding 

## 2012-12-19 ENCOUNTER — Ambulatory Visit: Payer: Self-pay | Admitting: Family Medicine

## 2012-12-19 DIAGNOSIS — Z79899 Other long term (current) drug therapy: Secondary | ICD-10-CM | POA: Diagnosis not present

## 2012-12-19 DIAGNOSIS — R222 Localized swelling, mass and lump, trunk: Secondary | ICD-10-CM | POA: Diagnosis not present

## 2012-12-21 ENCOUNTER — Ambulatory Visit: Payer: Self-pay | Admitting: Internal Medicine

## 2012-12-26 ENCOUNTER — Encounter: Payer: Self-pay | Admitting: Family Medicine

## 2012-12-26 ENCOUNTER — Ambulatory Visit (INDEPENDENT_AMBULATORY_CARE_PROVIDER_SITE_OTHER): Payer: Medicare Other | Admitting: Family Medicine

## 2012-12-26 VITALS — BP 122/54 | HR 72 | Temp 98.2°F | Ht 63.5 in | Wt 199.2 lb

## 2012-12-26 DIAGNOSIS — R300 Dysuria: Secondary | ICD-10-CM

## 2012-12-26 DIAGNOSIS — R32 Unspecified urinary incontinence: Secondary | ICD-10-CM

## 2012-12-26 DIAGNOSIS — B373 Candidiasis of vulva and vagina: Secondary | ICD-10-CM | POA: Diagnosis not present

## 2012-12-26 DIAGNOSIS — R5381 Other malaise: Secondary | ICD-10-CM

## 2012-12-26 DIAGNOSIS — R531 Weakness: Secondary | ICD-10-CM

## 2012-12-26 NOTE — Assessment & Plan Note (Signed)
This creates ongoing problem with skin breakdown Ref to uro-gyn at [facility] per daughter's request  Will continue pad use and barrier cream

## 2012-12-26 NOTE — Progress Notes (Signed)
Subjective:    Patient ID: Joy [name], female    DOB: 06-Jan-1930, 79 y.o.   MRN: [id number]  HPI Here for follow up of her gyn/ vaginal issues   Had to treat with cipro again and another diflucan  Thinks it helped -feeling better and mental status is improved cx showed mixed flora-it is very difficult to get a clean catch urine specimen   Some urinary burning - when urine hits the skin  Also incontinence   ? If swelling of vulva  Itching is still present - more anterior now and much less than it was  ? If vaginal discharge   Still using barrier cream   She does not drink enough water  She does drink OJ and cranberry juice and a little milk   Legs are buckling / gets wobbly and shaky more easily  Getting weaker  She has PT ref- has not gone yet   More short term memory problems - worse over the past month  Has seen a neurologist for that  Went to [facility] - did not like that experience    Still undergoing treatment for lung nodule Had PET scan - pos and will have bronchoscopy done -- thinks it is not cancer   Patient Active Problem List   Diagnosis Date Noted  . Weakness generalized 12/26/2012  . Incontinence 12/26/2012  . Candida UTI 12/01/2012  . Dysuria 12/01/2012  . Yeast vaginitis 12/01/2012  . Vulvar irritation 09/15/2012  . GERD (gastroesophageal reflux disease) 09/06/2012  . Neuropathy 01/14/2011  . Insomnia 01/14/2011  . 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 HTN (hypertension)   . COPD (chronic obstructive pulmonary disease)   . Depression   . Thyroid disease    Past Medical History  Diagnosis Date  . HTN (hypertension)   . COPD (chronic obstructive pulmonary disease)   . Depression   . Thyroid disease   . HLD (hyperlipidemia)   . CAD (coronary artery disease)     h/o NSTEMI, LAD stent placement 10/04, cath 05/11/2010  . PMR (polymyalgia rheumatica)     with h/o steroid induced  myopathy  . Anxiety   . Diastolic dysfunction     Echo 05/11/2010, EF 65-70%  . TIA (transient ischemic attack)     11/05 s/p L CEA   Past Surgical History  Procedure Laterality Date  . Cholecystectomy    . Carotid endarterectomy      left with stent placement 11/2003  . Iliac artery stent      right, 2001   History  Substance Use Topics  . Smoking status: Former Smoker    Quit date: 01/05/1996  . Smokeless tobacco: Never Used  . Alcohol Use: No   No family history on file. Allergies  Allergen Reactions  . Codeine Other (See Comments)    Coma for 2 days  . Prednisone Other (See Comments)    Weight gain,puffy face   Current Outpatient Prescriptions on File Prior to Visit  Medication Sig Dispense Refill  . ADVAIR DISKUS 250-50 MCG/DOSE AEPB INHALE 1 PUFF INTO THE LUNGS 2 (TWO) TIMES DAILY.  60 each  5  . albuterol (PROVENTIL HFA;VENTOLIN HFA) 108 (90 BASE) MCG/ACT inhaler Inhale 2 puffs into the lungs every 6 (six) hours as needed for wheezing.  1 Inhaler  0  . amLODipine (NORVASC) 5 MG tablet Take 1 tablet (5 mg total) by mouth daily.  90 tablet  1  . aspirin EC 81 MG  tablet Take 81 mg by mouth daily.        Marland Kitchen atorvastatin (LIPITOR) 40 MG tablet TAKE 1 TABLET BY MOUTH EVERY DAY  90 tablet  1  . ciprofloxacin (CIPRO) 250 MG tablet Take 1 tablet (250 mg total) by mouth 2 (two) times daily.  10 tablet  0  . clopidogrel (PLAVIX) 75 MG tablet TAKE 1 TABLET DAILY  90 tablet  1  . gabapentin (NEURONTIN) 800 MG tablet 1 capsule by mouth in AM and 2 capsules by mouth in PM. 1 capsule by mouth in AM and 2 capsules by mouth in PM.  180 tablet  0  . gabapentin (NEURONTIN) 800 MG tablet TAKE 1 CAPSULE EVERY       MORNING & 2 CAPSULES EVERY EVENING  180 tablet  3  . levothyroxine (SYNTHROID, LEVOTHROID) 25 MCG tablet TAKE 1 TABLET EVERY DAY  30 tablet  5  . lidocaine (LINDAMANTLE) 3 % CREA cream Apply 1 application topically as needed.        Marland Kitchen lisinopril (PRINIVIL,ZESTRIL) 10 MG tablet  TAKE 2 TABLETS (20 MG TOTAL) BY MOUTH DAILY.  60 tablet  5  . metoprolol tartrate (LOPRESSOR) 25 MG tablet Take one half tablet (12.5 mg) daily.  45 tablet  3  . nitroGLYCERIN (NITROSTAT) 0.4 MG SL tablet Place 0.4 mg under the tongue every 5 (five) minutes as needed.        . nystatin-triamcinolone ointment (MYCOLOG) Apply topically 2 (two) times daily. To affected area after it is cleaned and dried  30 g  1  . ranitidine (ZANTAC) 300 MG tablet TAKE 1 TABLET (300 MG TOTAL) BY MOUTH AT BEDTIME.  90 tablet  1  . sertraline (ZOLOFT) 100 MG tablet Take 1 tablet (100 mg total) by mouth daily.  90 tablet  0  . silver sulfADIAZINE (SILVADENE) 1 % cream Apply 1 application topically daily.  50 g  0  . tiotropium (SPIRIVA HANDIHALER) 18 MCG inhalation capsule Place 1 capsule (18 mcg total) into inhaler and inhale daily.  90 capsule  0  . tiotropium (SPIRIVA HANDIHALER) 18 MCG inhalation capsule Place 1 capsule (18 mcg total) into inhaler and inhale daily.  90 capsule  1  . traZODone (DESYREL) 50 MG tablet TAKE 1/2 - 1 TABLETS (25-50 MG TOTAL) BY MOUTH AT BEDTIME AS NEEDED FOR SLEEP.  30 tablet  3   No current facility-administered medications on file prior to visit.    Review of Systems Review of Systems  Constitutional: Negative for fever, appetite change,  and unexpected weight change.  Eyes: Negative for pain and visual disturbance.  Respiratory: Negative for cough and shortness of breath.   Cardiovascular: Negative for cp or palpitations    Gastrointestinal: Negative for nausea, diarrhea and constipation.  Genitourinary: Negative for urgency and frequency. pos for persistent incontinence / pos for vaginal irritation and itching that is improved , neg for hematuria or flank pain Skin: Negative for pallor or rash   Neurological: Negative for weakness, light-headedness, numbness and headaches.  Hematological: Negative for adenopathy. Does not bruise/bleed easily.  Psychiatric/Behavioral: Negative  for dysphoric mood. The patient is not nervous/anxious.  pos for loss of short term memory       Objective:   Physical Exam  Constitutional: She appears well-developed and well-nourished. No distress.  overwt and well app wearing 02  HENT:  Head: Normocephalic and atraumatic.  Mouth/Throat: Oropharynx is clear and moist.  Eyes: Conjunctivae and EOM are normal. Pupils are equal, round,  and reactive to light. Right eye exhibits no discharge. Left eye exhibits no discharge. No scleral icterus.  Neck: Normal range of motion. Neck supple. No thyromegaly present.  Cardiovascular: Normal rate and regular rhythm.   Pulmonary/Chest: Effort normal and breath sounds normal. No respiratory distress.  Harsh bs  No crackles   Abdominal: Soft. Bowel sounds are normal. She exhibits no distension and no mass. There is no tenderness.  No suprapubic tenderness or fullness    Genitourinary:  Previous denuded and white vaginal mucosa is much improved  Some areas of redness No pressure ulcers noted  No vaginal discharge   Constant urine leakage noted   Musculoskeletal: She exhibits no edema.  Lymphadenopathy:    She has no cervical adenopathy.  Neurological: She is alert. She has normal reflexes.  Skin: Skin is warm and dry. No pallor.  Psychiatric: She has a normal mood and affect.  Pleasant and talkative today Seems to feel better           Assessment & Plan:

## 2012-12-26 NOTE — Assessment & Plan Note (Signed)
Improved with healing of vaginal mucosa / use of barrier cream  Did enc inc water intake to avoid uti

## 2012-12-26 NOTE — Assessment & Plan Note (Signed)
Improved after tx of diflucan  Vaginal irritation is also imp with use of barrier cream

## 2012-12-26 NOTE — Assessment & Plan Note (Signed)
With recent acute on chronic illness and upcoming bronchoscopy  Will proceed with ref to PT for deconditioning  Is currently using a walker for safety - also closely supervised

## 2012-12-26 NOTE — Progress Notes (Signed)
Pre-visit discussion using our clinic review tool. No additional management support is needed unless otherwise documented below in the visit note.  

## 2012-12-26 NOTE — Patient Instructions (Signed)
I'm glad things are looking better Encourage fluid intake  Go forward with physical therapy referral - call to make her first appointment (to help generalized weakness)  Stop up front for uro- gyn referral at [place]  Follow up with Dr Deborra Medina when she returns for memory issues

## 2013-01-01 ENCOUNTER — Other Ambulatory Visit: Payer: Self-pay | Admitting: Family Medicine

## 2013-01-11 DIAGNOSIS — J9600 Acute respiratory failure, unspecified whether with hypoxia or hypercapnia: Secondary | ICD-10-CM | POA: Diagnosis not present

## 2013-01-11 DIAGNOSIS — M6281 Muscle weakness (generalized): Secondary | ICD-10-CM | POA: Diagnosis not present

## 2013-01-12 DIAGNOSIS — J9600 Acute respiratory failure, unspecified whether with hypoxia or hypercapnia: Secondary | ICD-10-CM | POA: Diagnosis not present

## 2013-01-12 DIAGNOSIS — M6281 Muscle weakness (generalized): Secondary | ICD-10-CM | POA: Diagnosis not present

## 2013-01-15 ENCOUNTER — Ambulatory Visit (INDEPENDENT_AMBULATORY_CARE_PROVIDER_SITE_OTHER): Payer: Medicare Other | Admitting: Family Medicine

## 2013-01-15 ENCOUNTER — Encounter: Payer: Self-pay | Admitting: Family Medicine

## 2013-01-15 VITALS — BP 118/64 | HR 69 | Temp 98.1°F | Wt 198.0 lb

## 2013-01-15 DIAGNOSIS — N9089 Other specified noninflammatory disorders of vulva and perineum: Secondary | ICD-10-CM | POA: Diagnosis not present

## 2013-01-15 DIAGNOSIS — M6281 Muscle weakness (generalized): Secondary | ICD-10-CM | POA: Diagnosis not present

## 2013-01-15 DIAGNOSIS — R911 Solitary pulmonary nodule: Secondary | ICD-10-CM | POA: Insufficient documentation

## 2013-01-15 DIAGNOSIS — J9600 Acute respiratory failure, unspecified whether with hypoxia or hypercapnia: Secondary | ICD-10-CM | POA: Diagnosis not present

## 2013-01-15 DIAGNOSIS — I10 Essential (primary) hypertension: Secondary | ICD-10-CM

## 2013-01-15 DIAGNOSIS — B373 Candidiasis of vulva and vagina: Secondary | ICD-10-CM

## 2013-01-15 DIAGNOSIS — B3731 Acute candidiasis of vulva and vagina: Secondary | ICD-10-CM

## 2013-01-15 DIAGNOSIS — R630 Anorexia: Secondary | ICD-10-CM

## 2013-01-15 DIAGNOSIS — R413 Other amnesia: Secondary | ICD-10-CM | POA: Diagnosis not present

## 2013-01-15 NOTE — Patient Instructions (Signed)
Great to see you. I will talk to PT on Wednesday.  Call me after your pulmonary appointment.

## 2013-01-15 NOTE — Assessment & Plan Note (Signed)
May be affecting O2 sats.  Advised to call me after appt with pulm tomorrow. Also advised to tell pulmonologist about her adverse rxn in past to prednisone. The patient and her daughter indicate understanding of these issues and agrees with the plan.

## 2013-01-15 NOTE — Assessment & Plan Note (Signed)
Given fatigue, I would like to wean down beta blocker. Will check with PT at [facility] to see what her BP has been running during therapy.  She does tend to have higher blood pressures in the evenings.

## 2013-01-15 NOTE — Progress Notes (Signed)
Subjective:    Patient ID: Joy [name], female    DOB: 03/28/1930, 80 y.o.   MRN: [id number]  HPI  Very pleasant 80 yo here with her daughter Joy [name] to discuss several concerns.  Pulmonary nodule- has follow up with [facility] pulm tomorrow.  Per [name], they think its an inflammatory nodule and rather than biopsy, considering tx with steroids first. PET scan lit up lung nodule but neg otherwise.  She wonders if this is causing her low O2 sat despite being on oxygen.  Denies feeling SOB.  Has been more fatigued.  Recurrent vaginitis- has appt with urogyn later this month.  Constant urinary leakage leading to recurrent vulvular infections.  Currently asymptomatic.  Memory issues- did see neuro at [facility] who did not feel she had dementia.  Wanted to reassess her several months later. Daughter Joy [name] thinks her memory loss has been progressive last few months.  Mainly has noticed she does not remember things she was told the day prior.  Patient Active Problem List   Diagnosis Date Noted  . Lung nodule 01/15/2013  . Memory loss 01/15/2013  . Weakness generalized 12/26/2012  . Incontinence 12/26/2012  . Dysuria 12/01/2012  . Yeast vaginitis 12/01/2012  . Vulvar irritation 09/15/2012  . GERD (gastroesophageal reflux disease) 09/06/2012  . Neuropathy 01/14/2011  . Insomnia 01/14/2011  . 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 HTN (hypertension)   . COPD (chronic obstructive pulmonary disease)   . Depression   . Thyroid disease    Past Medical History  Diagnosis Date  . HTN (hypertension)   . COPD (chronic obstructive pulmonary disease)   . Depression   . Thyroid disease   . HLD (hyperlipidemia)   . CAD (coronary artery disease)     h/o NSTEMI, LAD stent placement 10/04, cath 05/11/2010  . PMR (polymyalgia rheumatica)     with h/o steroid induced myopathy  . Anxiety   . Diastolic dysfunction     Echo 05/11/2010, EF 65-70%  . TIA  (transient ischemic attack)     11/05 s/p L CEA   Past Surgical History  Procedure Laterality Date  . Cholecystectomy    . Carotid endarterectomy      left with stent placement 11/2003  . Iliac artery stent      right, 2001   History  Substance Use Topics  . Smoking status: Former Smoker    Quit date: 01/05/1996  . Smokeless tobacco: Never Used  . Alcohol Use: No   No family history on file. Allergies  Allergen Reactions  . Codeine Other (See Comments)    Coma for 2 days  . Prednisone Other (See Comments)    Weight gain,puffy face   Current Outpatient Prescriptions on File Prior to Visit  Medication Sig Dispense Refill  . ADVAIR DISKUS 250-50 MCG/DOSE AEPB INHALE 1 PUFF INTO THE LUNGS 2 (TWO) TIMES DAILY.  60 each  5  . albuterol (PROVENTIL HFA;VENTOLIN HFA) 108 (90 BASE) MCG/ACT inhaler Inhale 2 puffs into the lungs every 6 (six) hours as needed for wheezing.  1 Inhaler  0  . amLODipine (NORVASC) 5 MG tablet Take 1 tablet (5 mg total) by mouth daily.  90 tablet  1  . aspirin EC 81 MG tablet Take 81 mg by mouth daily.        Marland Kitchen atorvastatin (LIPITOR) 40 MG tablet TAKE 1 TABLET BY MOUTH EVERY DAY  90 tablet  1  . clopidogrel (PLAVIX) 75 MG tablet TAKE 1 TABLET  DAILY  90 tablet  1  . gabapentin (NEURONTIN) 800 MG tablet 1 capsule by mouth in AM and 2 capsules by mouth in PM. 1 capsule by mouth in AM and 2 capsules by mouth in PM.  180 tablet  0  . gabapentin (NEURONTIN) 800 MG tablet TAKE 1 CAPSULE EVERY       MORNING & 2 CAPSULES EVERY EVENING  180 tablet  3  . levothyroxine (SYNTHROID, LEVOTHROID) 25 MCG tablet TAKE 1 TABLET EVERY DAY  30 tablet  5  . lidocaine (LINDAMANTLE) 3 % CREA cream Apply 1 application topically as needed.        Marland Kitchen lisinopril (PRINIVIL,ZESTRIL) 10 MG tablet TAKE 2 TABLETS (20 MG TOTAL) BY MOUTH DAILY.  60 tablet  5  . metoprolol tartrate (LOPRESSOR) 25 MG tablet Take one half tablet (12.5 mg) daily.  45 tablet  3  . nitroGLYCERIN (NITROSTAT) 0.4 MG SL  tablet Place 0.4 mg under the tongue every 5 (five) minutes as needed.        . nystatin-triamcinolone ointment (MYCOLOG) Apply topically 2 (two) times daily. To affected area after it is cleaned and dried  30 g  1  . ranitidine (ZANTAC) 300 MG tablet TAKE 1 TABLET (300 MG TOTAL) BY MOUTH AT BEDTIME.  90 tablet  1  . ranitidine (ZANTAC) 300 MG tablet TAKE 1 TABLET (300 MG TOTAL) BY MOUTH AT BEDTIME.  90 tablet  1  . sertraline (ZOLOFT) 100 MG tablet Take 1 tablet (100 mg total) by mouth daily.  90 tablet  0  . silver sulfADIAZINE (SILVADENE) 1 % cream Apply 1 application topically daily.  50 g  0  . tiotropium (SPIRIVA HANDIHALER) 18 MCG inhalation capsule Place 1 capsule (18 mcg total) into inhaler and inhale daily.  90 capsule  0  . tiotropium (SPIRIVA HANDIHALER) 18 MCG inhalation capsule Place 1 capsule (18 mcg total) into inhaler and inhale daily.  90 capsule  1  . traZODone (DESYREL) 50 MG tablet TAKE 1/2 - 1 TABLETS (25-50 MG TOTAL) BY MOUTH AT BEDTIME AS NEEDED FOR SLEEP.  30 tablet  3   No current facility-administered medications on file prior to visit.   The PMH, PSH, Social History, Family History, Medications, and allergies have been reviewed in [facility], and have been updated if relevant.  Review of Systems    See HPI + decreased appetite- she has been losing weight Wt Readings from Last 3 Encounters:  01/15/13 198 lb (89.812 kg)  12/26/12 199 lb 4 oz (90.379 kg)  12/01/12 206 lb 8 oz (93.668 kg)     Past Medical History  Diagnosis Date  . HTN (hypertension)   . COPD (chronic obstructive pulmonary disease)   . Depression   . Thyroid disease   . HLD (hyperlipidemia)   . CAD (coronary artery disease)     h/o NSTEMI, LAD stent placement 10/04, cath 05/11/2010  . PMR (polymyalgia rheumatica)     with h/o steroid induced myopathy  . Anxiety   . Diastolic dysfunction     Echo 05/11/2010, EF 65-70%  . TIA (transient ischemic attack)     11/05 s/p L CEA    Current Outpatient  Prescriptions  Medication Sig Dispense Refill  . ADVAIR DISKUS 250-50 MCG/DOSE AEPB INHALE 1 PUFF INTO THE LUNGS 2 (TWO) TIMES DAILY.  60 each  5  . albuterol (PROVENTIL HFA;VENTOLIN HFA) 108 (90 BASE) MCG/ACT inhaler Inhale 2 puffs into the lungs every 6 (six) hours as needed for  wheezing.  1 Inhaler  0  . amLODipine (NORVASC) 5 MG tablet Take 1 tablet (5 mg total) by mouth daily.  90 tablet  1  . aspirin EC 81 MG tablet Take 81 mg by mouth daily.        Marland Kitchen atorvastatin (LIPITOR) 40 MG tablet TAKE 1 TABLET BY MOUTH EVERY DAY  90 tablet  1  . clopidogrel (PLAVIX) 75 MG tablet TAKE 1 TABLET DAILY  90 tablet  1  . gabapentin (NEURONTIN) 800 MG tablet 1 capsule by mouth in AM and 2 capsules by mouth in PM. 1 capsule by mouth in AM and 2 capsules by mouth in PM.  180 tablet  0  . gabapentin (NEURONTIN) 800 MG tablet TAKE 1 CAPSULE EVERY       MORNING & 2 CAPSULES EVERY EVENING  180 tablet  3  . levothyroxine (SYNTHROID, LEVOTHROID) 25 MCG tablet TAKE 1 TABLET EVERY DAY  30 tablet  5  . lidocaine (LINDAMANTLE) 3 % CREA cream Apply 1 application topically as needed.        Marland Kitchen lisinopril (PRINIVIL,ZESTRIL) 10 MG tablet TAKE 2 TABLETS (20 MG TOTAL) BY MOUTH DAILY.  60 tablet  5  . metoprolol tartrate (LOPRESSOR) 25 MG tablet Take one half tablet (12.5 mg) daily.  45 tablet  3  . nitroGLYCERIN (NITROSTAT) 0.4 MG SL tablet Place 0.4 mg under the tongue every 5 (five) minutes as needed.        . nystatin-triamcinolone ointment (MYCOLOG) Apply topically 2 (two) times daily. To affected area after it is cleaned and dried  30 g  1  . ranitidine (ZANTAC) 300 MG tablet TAKE 1 TABLET (300 MG TOTAL) BY MOUTH AT BEDTIME.  90 tablet  1  . ranitidine (ZANTAC) 300 MG tablet TAKE 1 TABLET (300 MG TOTAL) BY MOUTH AT BEDTIME.  90 tablet  1  . sertraline (ZOLOFT) 100 MG tablet Take 1 tablet (100 mg total) by mouth daily.  90 tablet  0  . silver sulfADIAZINE (SILVADENE) 1 % cream Apply 1 application topically daily.  50 g  0   . tiotropium (SPIRIVA HANDIHALER) 18 MCG inhalation capsule Place 1 capsule (18 mcg total) into inhaler and inhale daily.  90 capsule  0  . tiotropium (SPIRIVA HANDIHALER) 18 MCG inhalation capsule Place 1 capsule (18 mcg total) into inhaler and inhale daily.  90 capsule  1  . traZODone (DESYREL) 50 MG tablet TAKE 1/2 - 1 TABLETS (25-50 MG TOTAL) BY MOUTH AT BEDTIME AS NEEDED FOR SLEEP.  30 tablet  3   No current facility-administered medications for this visit.    Allergies  Allergen Reactions  . Codeine Other (See Comments)    Coma for 2 days  . Prednisone Other (See Comments)    Weight gain,puffy face    No family history on file.  History   Social History  . Marital Status: Married    Spouse Name: N/A    Number of Children: N/A  . Years of Education: N/A   Occupational History  . retired     Pharmacist, hospital   Social History Main Topics  . Smoking status: Former Smoker    Quit date: 01/05/1996  . Smokeless tobacco: Never Used  . Alcohol Use: No  . Drug Use: No  . Sexual Activity: Not on file   Other Topics Concern  . Not on file   Social History Narrative   Lives with husband, [name] at [place].   Daughters, [name]  and [name] very involved.      Objective:   Physical Exam   BP 118/64  Pulse 69  Temp(Src) 98.1 F (36.7 C) (Oral)  Wt 198 lb (89.812 kg)  SpO2 86% Wt Readings from Last 3 Encounters:  01/15/13 198 lb (89.812 kg)  12/26/12 199 lb 4 oz (90.379 kg)  12/01/12 206 lb 8 oz (93.668 kg)    General: Appears her stated age, chronically ill appearing in NAD. Cardiovascular: Normal rate and rhythm. S1,S2 noted.  No murmur, rubs or gallops noted. No JVD or BLE edema. No carotid bruits noted. Pulmonary/Chest: Normal effort and positive vesicular breath sounds although fibrotic sounding. No respiratory distress. No wheezes, rales or ronchi noted. Biopsy site CDI. Neurological: Alert and oriented.   BMET    Component Value Date/Time   NA 143 05/25/2012  1047   K 4.4 05/25/2012 1047   CL 105 05/25/2012 1047   CO2 32 05/25/2012 1047   GLUCOSE 127* 05/25/2012 1047   BUN 19 05/25/2012 1047   CREATININE 1.2 05/25/2012 1047   CALCIUM 8.8 05/25/2012 1047    Lipid Panel     Component Value Date/Time   CHOL 287* 01/06/2010   TRIG 289* 01/06/2010   HDL 39 01/06/2010   LDLCALC 190 01/06/2010    CBC    Component Value Date/Time   WBC 10.5 05/25/2012 1047   RBC 4.26 05/25/2012 1047   HGB 12.0 05/25/2012 1047   HCT 36.2 05/25/2012 1047   PLT 221.0 05/25/2012 1047   MCV 85.0 05/25/2012 1047   MCHC 33.1 05/25/2012 1047   RDW 15.8* 05/25/2012 1047   LYMPHSABS 1.2 05/25/2012 1047   MONOABS 1.0 05/25/2012 1047   EOSABS 0.2 05/25/2012 1047   BASOSABS 0.0 05/25/2012 1047    Hgb A1C No results found for this basename: HGBA1C        Assessment & Plan:

## 2013-01-15 NOTE — Assessment & Plan Note (Signed)
S/p Diflucan. Asymptomatic. Agree with Uro Gyn referral.

## 2013-01-15 NOTE — Assessment & Plan Note (Signed)
Discussed importance of good nutrition.  If she does not feel like eating, she could drink Ensure or another type of supplement. The patient indicates understanding of these issues and agrees with the plan.

## 2013-01-15 NOTE — Progress Notes (Signed)
Pre-visit discussion using our clinic review tool. No additional management support is needed unless otherwise documented below in the visit note.  

## 2013-01-15 NOTE — Assessment & Plan Note (Signed)
Likely multifactorial. Decreased oxygenation could be playing a roll.  Denies depression although I feel this is likely a component.  Also has been very fearful that she has cancer and hopefully once she has a definitive diagnosis, she will feel better.  This can also affect memory.  Will reassess after her pulm appt.

## 2013-01-16 ENCOUNTER — Telehealth: Payer: Self-pay | Admitting: Family Medicine

## 2013-01-16 DIAGNOSIS — R222 Localized swelling, mass and lump, trunk: Secondary | ICD-10-CM | POA: Diagnosis not present

## 2013-01-16 NOTE — Telephone Encounter (Signed)
Relevant patient education assigned to patient using Emmi. ° °

## 2013-01-17 DIAGNOSIS — M6281 Muscle weakness (generalized): Secondary | ICD-10-CM | POA: Diagnosis not present

## 2013-01-17 DIAGNOSIS — J9600 Acute respiratory failure, unspecified whether with hypoxia or hypercapnia: Secondary | ICD-10-CM | POA: Diagnosis not present

## 2013-01-18 DIAGNOSIS — J9600 Acute respiratory failure, unspecified whether with hypoxia or hypercapnia: Secondary | ICD-10-CM | POA: Diagnosis not present

## 2013-01-18 DIAGNOSIS — M6281 Muscle weakness (generalized): Secondary | ICD-10-CM | POA: Diagnosis not present

## 2013-01-19 DIAGNOSIS — M6281 Muscle weakness (generalized): Secondary | ICD-10-CM | POA: Diagnosis not present

## 2013-01-19 DIAGNOSIS — J9600 Acute respiratory failure, unspecified whether with hypoxia or hypercapnia: Secondary | ICD-10-CM | POA: Diagnosis not present

## 2013-01-22 DIAGNOSIS — M6281 Muscle weakness (generalized): Secondary | ICD-10-CM | POA: Diagnosis not present

## 2013-01-22 DIAGNOSIS — J9600 Acute respiratory failure, unspecified whether with hypoxia or hypercapnia: Secondary | ICD-10-CM | POA: Diagnosis not present

## 2013-01-23 DIAGNOSIS — M6281 Muscle weakness (generalized): Secondary | ICD-10-CM | POA: Diagnosis not present

## 2013-01-23 DIAGNOSIS — J9600 Acute respiratory failure, unspecified whether with hypoxia or hypercapnia: Secondary | ICD-10-CM | POA: Diagnosis not present

## 2013-01-24 DIAGNOSIS — R32 Unspecified urinary incontinence: Secondary | ICD-10-CM | POA: Diagnosis not present

## 2013-01-24 DIAGNOSIS — R300 Dysuria: Secondary | ICD-10-CM | POA: Insufficient documentation

## 2013-01-24 DIAGNOSIS — L292 Pruritus vulvae: Secondary | ICD-10-CM | POA: Insufficient documentation

## 2013-01-24 DIAGNOSIS — L940 Localized scleroderma [morphea]: Secondary | ICD-10-CM | POA: Diagnosis not present

## 2013-01-24 DIAGNOSIS — L293 Anogenital pruritus, unspecified: Secondary | ICD-10-CM | POA: Diagnosis not present

## 2013-01-25 ENCOUNTER — Observation Stay: Payer: Self-pay | Admitting: Internal Medicine

## 2013-01-25 DIAGNOSIS — G9349 Other encephalopathy: Secondary | ICD-10-CM | POA: Diagnosis not present

## 2013-01-25 DIAGNOSIS — R079 Chest pain, unspecified: Secondary | ICD-10-CM | POA: Diagnosis not present

## 2013-01-25 DIAGNOSIS — I672 Cerebral atherosclerosis: Secondary | ICD-10-CM | POA: Diagnosis not present

## 2013-01-25 DIAGNOSIS — F411 Generalized anxiety disorder: Secondary | ICD-10-CM | POA: Diagnosis not present

## 2013-01-25 DIAGNOSIS — F0150 Vascular dementia without behavioral disturbance: Secondary | ICD-10-CM | POA: Diagnosis not present

## 2013-01-25 DIAGNOSIS — R52 Pain, unspecified: Secondary | ICD-10-CM | POA: Diagnosis not present

## 2013-01-25 DIAGNOSIS — F3289 Other specified depressive episodes: Secondary | ICD-10-CM | POA: Diagnosis not present

## 2013-01-25 DIAGNOSIS — R0789 Other chest pain: Secondary | ICD-10-CM | POA: Diagnosis not present

## 2013-01-25 DIAGNOSIS — G609 Hereditary and idiopathic neuropathy, unspecified: Secondary | ICD-10-CM | POA: Diagnosis not present

## 2013-01-25 DIAGNOSIS — E785 Hyperlipidemia, unspecified: Secondary | ICD-10-CM | POA: Diagnosis not present

## 2013-01-25 DIAGNOSIS — F329 Major depressive disorder, single episode, unspecified: Secondary | ICD-10-CM | POA: Diagnosis not present

## 2013-01-25 DIAGNOSIS — Z6830 Body mass index (BMI) 30.0-30.9, adult: Secondary | ICD-10-CM | POA: Diagnosis not present

## 2013-01-25 DIAGNOSIS — Z7902 Long term (current) use of antithrombotics/antiplatelets: Secondary | ICD-10-CM | POA: Diagnosis not present

## 2013-01-25 DIAGNOSIS — E872 Acidosis, unspecified: Secondary | ICD-10-CM | POA: Diagnosis not present

## 2013-01-25 DIAGNOSIS — Z9981 Dependence on supplemental oxygen: Secondary | ICD-10-CM | POA: Diagnosis not present

## 2013-01-25 DIAGNOSIS — E669 Obesity, unspecified: Secondary | ICD-10-CM | POA: Diagnosis not present

## 2013-01-25 DIAGNOSIS — J449 Chronic obstructive pulmonary disease, unspecified: Secondary | ICD-10-CM | POA: Diagnosis not present

## 2013-01-25 DIAGNOSIS — I2510 Atherosclerotic heart disease of native coronary artery without angina pectoris: Secondary | ICD-10-CM | POA: Diagnosis not present

## 2013-01-25 DIAGNOSIS — Z8673 Personal history of transient ischemic attack (TIA), and cerebral infarction without residual deficits: Secondary | ICD-10-CM | POA: Diagnosis not present

## 2013-01-25 DIAGNOSIS — J9600 Acute respiratory failure, unspecified whether with hypoxia or hypercapnia: Secondary | ICD-10-CM | POA: Diagnosis not present

## 2013-01-25 DIAGNOSIS — Z7982 Long term (current) use of aspirin: Secondary | ICD-10-CM | POA: Diagnosis not present

## 2013-01-25 DIAGNOSIS — Z79899 Other long term (current) drug therapy: Secondary | ICD-10-CM | POA: Diagnosis not present

## 2013-01-25 DIAGNOSIS — I10 Essential (primary) hypertension: Secondary | ICD-10-CM | POA: Diagnosis not present

## 2013-01-25 DIAGNOSIS — M6281 Muscle weakness (generalized): Secondary | ICD-10-CM | POA: Diagnosis not present

## 2013-01-25 DIAGNOSIS — I498 Other specified cardiac arrhythmias: Secondary | ICD-10-CM | POA: Diagnosis not present

## 2013-01-25 DIAGNOSIS — Z87891 Personal history of nicotine dependence: Secondary | ICD-10-CM | POA: Diagnosis not present

## 2013-01-25 DIAGNOSIS — R0609 Other forms of dyspnea: Secondary | ICD-10-CM | POA: Diagnosis not present

## 2013-01-25 DIAGNOSIS — E039 Hypothyroidism, unspecified: Secondary | ICD-10-CM | POA: Diagnosis not present

## 2013-01-25 DIAGNOSIS — M353 Polymyalgia rheumatica: Secondary | ICD-10-CM | POA: Diagnosis not present

## 2013-01-25 DIAGNOSIS — J984 Other disorders of lung: Secondary | ICD-10-CM | POA: Diagnosis not present

## 2013-01-25 DIAGNOSIS — Z9861 Coronary angioplasty status: Secondary | ICD-10-CM | POA: Diagnosis not present

## 2013-01-25 LAB — BASIC METABOLIC PANEL
Anion Gap: 2 — ABNORMAL LOW (ref 7–16)
BUN: 28 mg/dL — AB (ref 7–18)
CO2: 32 mmol/L (ref 21–32)
CREATININE: 1.09 mg/dL (ref 0.60–1.30)
Calcium, Total: 8.4 mg/dL — ABNORMAL LOW (ref 8.5–10.1)
Chloride: 104 mmol/L (ref 98–107)
EGFR (African American): 56 — ABNORMAL LOW
GFR CALC NON AF AMER: 48 — AB
GLUCOSE: 187 mg/dL — AB (ref 65–99)
OSMOLALITY: 286 (ref 275–301)
Potassium: 4.7 mmol/L (ref 3.5–5.1)
Sodium: 138 mmol/L (ref 136–145)

## 2013-01-25 LAB — CBC
HCT: 37.6 % (ref 35.0–47.0)
HGB: 12.3 g/dL (ref 12.0–16.0)
MCH: 28.6 pg (ref 26.0–34.0)
MCHC: 32.6 g/dL (ref 32.0–36.0)
MCV: 88 fL (ref 80–100)
Platelet: 185 10*3/uL (ref 150–440)
RBC: 4.28 10*6/uL (ref 3.80–5.20)
RDW: 15.8 % — AB (ref 11.5–14.5)
WBC: 10.4 10*3/uL (ref 3.6–11.0)

## 2013-01-25 LAB — TROPONIN I: Troponin-I: <0.02 ng/mL

## 2013-01-26 DIAGNOSIS — R079 Chest pain, unspecified: Secondary | ICD-10-CM | POA: Diagnosis not present

## 2013-01-26 DIAGNOSIS — R911 Solitary pulmonary nodule: Secondary | ICD-10-CM | POA: Diagnosis not present

## 2013-01-26 DIAGNOSIS — I10 Essential (primary) hypertension: Secondary | ICD-10-CM | POA: Diagnosis not present

## 2013-01-26 DIAGNOSIS — J438 Other emphysema: Secondary | ICD-10-CM | POA: Diagnosis not present

## 2013-01-26 DIAGNOSIS — I498 Other specified cardiac arrhythmias: Secondary | ICD-10-CM | POA: Diagnosis not present

## 2013-01-26 LAB — TROPONIN I: Troponin-I: <0.02 ng/mL

## 2013-01-26 LAB — CK TOTAL AND CKMB (NOT AT ARMC)
CK, Total: 25 U/L (ref 21–215)
CK-MB: 0.7 ng/mL (ref 0.5–3.6)

## 2013-01-26 LAB — CK-MB: CK-MB: <0.5 ng/mL — ABNORMAL LOW (ref 0.5–3.6)

## 2013-01-29 ENCOUNTER — Telehealth: Payer: Self-pay

## 2013-01-29 DIAGNOSIS — M6281 Muscle weakness (generalized): Secondary | ICD-10-CM | POA: Diagnosis not present

## 2013-01-29 DIAGNOSIS — J9600 Acute respiratory failure, unspecified whether with hypoxia or hypercapnia: Secondary | ICD-10-CM | POA: Diagnosis not present

## 2013-01-29 NOTE — Telephone Encounter (Signed)
Patient contacted regarding discharge from [facility] on 01/26/13.  Patient understands to follow up with Dr. Fletcher Anon on 02/02/13 at 2:30 at [facility]. Patient understands discharge instructions? yes Patient understands medications and regiment? yes Patient understands to bring all medications to this visit? yes

## 2013-01-30 ENCOUNTER — Telehealth: Payer: Self-pay | Admitting: Family Medicine

## 2013-01-30 ENCOUNTER — Telehealth: Payer: Self-pay | Admitting: *Deleted

## 2013-01-30 DIAGNOSIS — M6281 Muscle weakness (generalized): Secondary | ICD-10-CM | POA: Diagnosis not present

## 2013-01-30 DIAGNOSIS — J9600 Acute respiratory failure, unspecified whether with hypoxia or hypercapnia: Secondary | ICD-10-CM | POA: Diagnosis not present

## 2013-01-30 DIAGNOSIS — R079 Chest pain, unspecified: Secondary | ICD-10-CM

## 2013-01-30 NOTE — Telephone Encounter (Signed)
Per Dr Fletcher Anon,  Pt needs lexiscan myoview dx: 786.5, please call daughter, Mirna Mires [phone]   Appt set up with Mirna Mires

## 2013-01-30 NOTE — Telephone Encounter (Signed)
Message copied by Tracie Harrier on Tue Jan 30, 2013  4:56 PM ------      Message from: Blain Pais      Created: Mon Jan 29, 2013  2:58 PM      Regarding: needs lexiscan/myoview       Per Dr Fletcher Anon,       Pt needs lexiscan myoview dx: 786.5, please call daughter, Mirna Mires [phone] ------

## 2013-01-30 NOTE — Telephone Encounter (Signed)
Attending [employer] Statement for [place] was dropped off for pt.  I have completed the form and it needs Dr. Hulen Shouts review/signature.  This form is in an orange folder, which I have placed on your desk.  Could you please pass them on to Dr. Deborra Medina? Thank you.

## 2013-01-31 ENCOUNTER — Telehealth: Payer: Self-pay | Admitting: Family Medicine

## 2013-01-31 ENCOUNTER — Encounter: Payer: Self-pay | Admitting: *Deleted

## 2013-01-31 NOTE — Telephone Encounter (Signed)
i have received the paperwork and placed in it Dr Hulen Shouts inbox. She is out of office on today, but I will bring it back to you upon her arrival and completion

## 2013-01-31 NOTE — Telephone Encounter (Signed)
Spoke to pts daughter [name] who states that the pt did not have a biopsy done last week. I contacted [facility] at [phone] and the last imaging procedure that the pt had done was a PET scan on 12/19/12. She was scheduled to have a bronchoscopy completed on 01/08/13, which was cancelled and they met with Dr Ronalee Red, the pulmonologist, on 01/16/13 who stated that he was "almost sure" that it wasn't cancer and wanted to Tx with a steroid first. He thought that anesthesiology would not be a good idea for pt.  Pt was seen at [facility] on 01/25/13. i have attempted to contact Gray at [facility], leaving her a message as I was unable to speak with her.  Pts daughter [name] can be reached at [phone].

## 2013-01-31 NOTE — Telephone Encounter (Signed)
Can you please call to find out what biopsy results are?  I think she was having this done at [facility].

## 2013-01-31 NOTE — Telephone Encounter (Signed)
[place]  Your caregiver has ordered a Stress Test with nuclear imaging. The purpose of this test is to evaluate the blood supply to your heart muscle. This procedure is referred to as a "Non-Invasive Stress Test." This is because other than having an IV started in your vein, nothing is inserted or "invades" your body. Cardiac stress tests are done to find areas of poor blood flow to the heart by determining the extent of coronary artery disease (CAD). Some patients exercise on a treadmill, which naturally increases the blood flow to your heart, while others who are  unable to walk on a treadmill due to physical limitations have a pharmacologic/chemical stress agent called Lexiscan . This medicine will mimic walking on a treadmill by temporarily increasing your coronary blood flow.   Please note: these test may take anywhere between 2-4 hours to complete  PLEASE REPORT TO [place] AT THE FIRST DESK WILL DIRECT YOU WHERE TO GO  Date of Procedure:___________2/2/15__________________________  Arrival Time for Procedure:________1015am______________________  Instructions regarding medication:    PLEASE NOTIFY THE OFFICE AT LEAST 24 HOURS IN ADVANCE IF YOU ARE UNABLE TO KEEP YOUR APPOINTMENT.  [phone] AND  PLEASE NOTIFY NUCLEAR MEDICINE AT [facility] AT LEAST 24 HOURS IN ADVANCE IF YOU ARE UNABLE TO KEEP YOUR APPOINTMENT. [phone]  How to prepare for your Myoview test:  1. Do not eat or drink after midnight 2. No caffeine for 24 hours prior to test 3. No smoking 24 hours prior to test. 4. Your medication may be taken with water.  If your doctor stopped a medication because of this test, do not take that medication. 5. Ladies, please do not wear dresses.  Skirts or pants are appropriate. Please wear a short sleeve shirt. 6. No perfume, cologne or lotion. 7. Wear comfortable walking shoes. No heels!          Informed patient and patients daughter of  instructions and mailed a copy to them.

## 2013-01-31 NOTE — Telephone Encounter (Signed)
Valetta Fuller, Nurse at [facility] left voicemail with Triage. Valetta Fuller is requesting you to fax pt's biopsy results from last week at fax# [phone], and once it's done [facility] request you call her back at phone # [phone]

## 2013-01-31 NOTE — Telephone Encounter (Signed)
Lm with Joy [name] informing her that i am not able to find where a biopsy has been completed. Pts note states that they were to tx with a steroid before completing a biopsy.

## 2013-02-01 DIAGNOSIS — J9600 Acute respiratory failure, unspecified whether with hypoxia or hypercapnia: Secondary | ICD-10-CM | POA: Diagnosis not present

## 2013-02-01 DIAGNOSIS — M6281 Muscle weakness (generalized): Secondary | ICD-10-CM | POA: Diagnosis not present

## 2013-02-01 NOTE — Telephone Encounter (Signed)
Yes that is what I thought as well.  I will send [name] an email stating what you found out. Thank you.

## 2013-02-02 ENCOUNTER — Encounter: Payer: Self-pay | Admitting: Cardiovascular Disease

## 2013-02-02 ENCOUNTER — Ambulatory Visit (INDEPENDENT_AMBULATORY_CARE_PROVIDER_SITE_OTHER): Payer: Medicare Other | Admitting: Cardiovascular Disease

## 2013-02-02 VITALS — BP 108/54 | HR 52 | Ht 64.0 in | Wt 198.0 lb

## 2013-02-02 DIAGNOSIS — R079 Chest pain, unspecified: Secondary | ICD-10-CM | POA: Diagnosis not present

## 2013-02-02 DIAGNOSIS — E785 Hyperlipidemia, unspecified: Secondary | ICD-10-CM

## 2013-02-02 DIAGNOSIS — I2510 Atherosclerotic heart disease of native coronary artery without angina pectoris: Secondary | ICD-10-CM

## 2013-02-02 DIAGNOSIS — I10 Essential (primary) hypertension: Secondary | ICD-10-CM

## 2013-02-02 NOTE — Telephone Encounter (Signed)
Forms were faxed to [facility] per request

## 2013-02-02 NOTE — Patient Instructions (Signed)
[place]  Your caregiver has ordered a Stress Test with nuclear imaging. The purpose of this test is to evaluate the blood supply to your heart muscle. This procedure is referred to as a "Non-Invasive Stress Test." This is because other than having an IV started in your vein, nothing is inserted or "invades" your body. Cardiac stress tests are done to find areas of poor blood flow to the heart by determining the extent of coronary artery disease (CAD). Some patients exercise on a treadmill, which naturally increases the blood flow to your heart, while others who are unable to walk on a treadmill due to physical limitations have a pharmacologic/chemical stress agent called Lexiscan . This medicine will mimic walking on a treadmill by temporarily increasing your coronary blood flow.  Please note: these test may take anywhere between 2-4 hours to complete  PLEASE REPORT TO [place] AT THE FIRST DESK WILL DIRECT YOU WHERE TO GO  Date of Procedure:___________2/2/15__________________________  Arrival Time for Procedure:________1015am______________________  Instructions regarding medication:  PLEASE NOTIFY THE OFFICE AT LEAST 24 HOURS IN ADVANCE IF YOU ARE UNABLE TO KEEP YOUR APPOINTMENT. [phone]  AND  PLEASE NOTIFY NUCLEAR MEDICINE AT [facility] AT LEAST 24 HOURS IN ADVANCE IF YOU ARE UNABLE TO KEEP YOUR APPOINTMENT. [phone]  How to prepare for your Myoview test:  1. Do not eat or drink after midnight 2. No caffeine for 24 hours prior to test 3. No smoking 24 hours prior to test. 4. Your medication may be taken with water. If your doctor stopped a medication because of this test, do not take that medication. 5. Ladies, please do not wear dresses. Skirts or pants are appropriate. Please wear a short sleeve shirt. 6. No perfume, cologne or lotion. 7. Wear comfortable walking shoes. No heels!   Your physician has recommended you make the following change in your  medication:  Stop Metoprolol    Your physician wants you to follow-up in: 6 months You will receive a reminder letter in the mail two months in advance. If you don't receive a letter, please call our office to schedule the follow-up appointment.

## 2013-02-03 ENCOUNTER — Encounter: Payer: Self-pay | Admitting: Cardiovascular Disease

## 2013-02-03 DIAGNOSIS — I2510 Atherosclerotic heart disease of native coronary artery without angina pectoris: Secondary | ICD-10-CM | POA: Insufficient documentation

## 2013-02-03 DIAGNOSIS — E785 Hyperlipidemia, unspecified: Secondary | ICD-10-CM | POA: Insufficient documentation

## 2013-02-03 NOTE — Progress Notes (Signed)
Primary care physician: Dr. Deborra Medina  HPI  This is a pleasant 80 year old female who is here today for followup visit after recent hospitalization at [facility]. She has known history of coronary artery disease with previous LAD stent in 2004 at [facility]. She use to followup with a cardiologist there but now wants to followup locally. Other medical problems include vascular dementia, CVA, carotid artery stenting, peripheral arterial disease, hypertension and hyperlipidemia. She was hospitalized briefly at [facility] after she presented with atypical chest pain. She lives at [place] with her husband. She had a previous echocardiogram in 2011 which showed normal LV systolic function. Most recent stress test in 2011 showed no evidence of ischemia with normal ejection fraction. While hospitalized, she was noted to have episodes of bradycardia at night with heart rates in the upper 30s. She complains of mild dizziness but no syncope. She had a CTA of the chest which showed no evidence of pulmonary embolism.  Allergies  Allergen Reactions  . Codeine Other (See Comments)    Coma for 2 days  . Prednisone Other (See Comments)    Weight gain,puffy face  . Shellfish Allergy      Current Outpatient Prescriptions on File Prior to Visit  Medication Sig Dispense Refill  . ADVAIR DISKUS 250-50 MCG/DOSE AEPB INHALE 1 PUFF INTO THE LUNGS 2 (TWO) TIMES DAILY.  60 each  5  . albuterol (PROVENTIL HFA;VENTOLIN HFA) 108 (90 BASE) MCG/ACT inhaler Inhale 2 puffs into the lungs every 6 (six) hours as needed for wheezing.  1 Inhaler  0  . amLODipine (NORVASC) 5 MG tablet Take 1 tablet (5 mg total) by mouth daily.  90 tablet  1  . aspirin EC 81 MG tablet Take 81 mg by mouth daily.        Marland Kitchen atorvastatin (LIPITOR) 40 MG tablet TAKE 1 TABLET BY MOUTH EVERY DAY  90 tablet  1  . clopidogrel (PLAVIX) 75 MG tablet TAKE 1 TABLET DAILY  90 tablet  1  . gabapentin (NEURONTIN) 800 MG tablet 1 capsule by mouth in AM and 2 capsules by mouth in PM.  1 capsule by mouth in AM and 2 capsules by mouth in PM.  180 tablet  0  . gabapentin (NEURONTIN) 800 MG tablet TAKE 1 CAPSULE EVERY       MORNING & 2 CAPSULES EVERY EVENING  180 tablet  3  . levothyroxine (SYNTHROID, LEVOTHROID) 25 MCG tablet TAKE 1 TABLET EVERY DAY  30 tablet  5  . lidocaine (LINDAMANTLE) 3 % CREA cream Apply 1 application topically as needed.        Marland Kitchen lisinopril (PRINIVIL,ZESTRIL) 10 MG tablet TAKE 2 TABLETS (20 MG TOTAL) BY MOUTH DAILY.  60 tablet  5  . nitroGLYCERIN (NITROSTAT) 0.4 MG SL tablet Place 0.4 mg under the tongue every 5 (five) minutes as needed.        . nystatin-triamcinolone ointment (MYCOLOG) Apply topically 2 (two) times daily. To affected area after it is cleaned and dried  30 g  1  . ranitidine (ZANTAC) 300 MG tablet TAKE 1 TABLET (300 MG TOTAL) BY MOUTH AT BEDTIME.  90 tablet  1  . ranitidine (ZANTAC) 300 MG tablet TAKE 1 TABLET (300 MG TOTAL) BY MOUTH AT BEDTIME.  90 tablet  1  . sertraline (ZOLOFT) 100 MG tablet Take 1 tablet (100 mg total) by mouth daily.  90 tablet  0  . silver sulfADIAZINE (SILVADENE) 1 % cream Apply 1 application topically daily.  50 g  0  . tiotropium (SPIRIVA HANDIHALER) 18 MCG inhalation capsule Place 1 capsule (18 mcg total) into inhaler and inhale daily.  90 capsule  0  . tiotropium (SPIRIVA HANDIHALER) 18 MCG inhalation capsule Place 1 capsule (18 mcg total) into inhaler and inhale daily.  90 capsule  1  . traZODone (DESYREL) 50 MG tablet TAKE 1/2 - 1 TABLETS (25-50 MG TOTAL) BY MOUTH AT BEDTIME AS NEEDED FOR SLEEP.  30 tablet  3   No current facility-administered medications on file prior to visit.     Past Medical History  Diagnosis Date  . HTN (hypertension)   . COPD (chronic obstructive pulmonary disease)   . Depression   . Thyroid disease   . HLD (hyperlipidemia)   . PMR (polymyalgia rheumatica)     with h/o steroid induced myopathy  . Anxiety   . Diastolic dysfunction     Echo 05/11/2010, EF 65-70%  . TIA  (transient ischemic attack)     11/05 s/p L CEA  . Kidney infection   . Carotid artery occlusion   . CAD (coronary artery disease)     h/o NSTEMI, LAD stent placement 10/04, cath 05/11/2010     Past Surgical History  Procedure Laterality Date  . Cholecystectomy    . Carotid endarterectomy      left with stent placement 11/2003  . Iliac artery stent      right, 2001  . Cardiac catheterization      [facility]  . Cardiac catheterization    . Cardiac catheterization    . Lung biopsy       History reviewed. No pertinent family history.   History   Social History  . Marital Status: Married    Spouse Name: N/A    Number of Children: N/A  . Years of Education: N/A   Occupational History  . retired     Pharmacist, hospital   Social History Main Topics  . Smoking status: Former Smoker -- 1.00 packs/day for 25 years    Types: Cigarettes    Quit date: 01/05/1996  . Smokeless tobacco: Never Used  . Alcohol Use: No  . Drug Use: No  . Sexual Activity: Not on file   Other Topics Concern  . Not on file   Social History Narrative   Lives with husband, [name] at [place].   Daughters, [name] and [name] very involved.     PHYSICAL EXAM   BP 108/54  Pulse 52  Ht 5\' 4"  (1.626 m)  Wt 198 lb (89.812 kg)  BMI 33.97 kg/m2 Constitutional: She is oriented to person, place, and time. She appears well-developed and well-nourished. No distress.  HENT: No nasal discharge.  Head: Normocephalic and atraumatic.  Eyes: Pupils are equal and round. No discharge.  Neck: Normal range of motion. Neck supple. No JVD present. No thyromegaly present.  Cardiovascular: Normal rate, regular rhythm, normal heart sounds. Exam reveals no gallop and no friction rub. No murmur heard.  Pulmonary/Chest: Effort normal and breath sounds normal. No stridor. No respiratory distress. She has no wheezes. She has no rales. She exhibits no tenderness.  Abdominal: Soft. Bowel sounds are normal. She exhibits no distension. There  is no tenderness. There is no rebound and no guarding.  Musculoskeletal: Normal range of motion. She exhibits no edema and no tenderness.  Neurological: She is alert and oriented to person, place, and time. Coordination normal.  Skin: Skin is warm and dry. No rash noted. She is not diaphoretic. No erythema. No pallor.  Psychiatric: She has a normal mood and affect. Her behavior is normal. Judgment and thought content normal.     EKG: Sinus bradycardia with a heart rate of 52 beats per minute. No significant ST or T wave changes.   ASSESSMENT AND PLAN

## 2013-02-03 NOTE — Assessment & Plan Note (Signed)
Lab Results  Component Value Date   CHOL 287* 01/06/2010   HDL 39 01/06/2010   LDLCALC 190 01/06/2010   TRIG 289* 01/06/2010   She is currently on atorvastatin 40 mg once daily. No recent lipid profile.

## 2013-02-03 NOTE — Assessment & Plan Note (Signed)
The patient has known history of coronary artery disease with previous stenting. She had recent atypical chest pain. No recent ischemic cardiac evaluation. I recommend a pharmacologic nuclear stress test for evaluation. She is not able to exercise on a treadmill.

## 2013-02-03 NOTE — Assessment & Plan Note (Signed)
Blood pressure is well controlled on current medications. However, given her significant bradycardia, I recommend stopping metoprolol.

## 2013-02-05 ENCOUNTER — Ambulatory Visit: Payer: Self-pay | Admitting: Cardiovascular Disease

## 2013-02-05 ENCOUNTER — Other Ambulatory Visit: Payer: Self-pay | Admitting: *Deleted

## 2013-02-05 DIAGNOSIS — R079 Chest pain, unspecified: Secondary | ICD-10-CM | POA: Diagnosis not present

## 2013-02-05 MED ORDER — GABAPENTIN 800 MG PO TABS: 800.0000 mg | ORAL_TABLET | Freq: Three times a day (TID) | ORAL | Status: DC

## 2013-02-06 ENCOUNTER — Other Ambulatory Visit: Payer: Self-pay

## 2013-02-06 ENCOUNTER — Ambulatory Visit: Payer: Self-pay | Admitting: Family Medicine

## 2013-02-06 DIAGNOSIS — R079 Chest pain, unspecified: Secondary | ICD-10-CM

## 2013-02-06 DIAGNOSIS — F3289 Other specified depressive episodes: Secondary | ICD-10-CM | POA: Diagnosis not present

## 2013-02-06 DIAGNOSIS — F329 Major depressive disorder, single episode, unspecified: Secondary | ICD-10-CM | POA: Diagnosis not present

## 2013-02-07 DIAGNOSIS — M6281 Muscle weakness (generalized): Secondary | ICD-10-CM | POA: Diagnosis not present

## 2013-02-07 DIAGNOSIS — J9600 Acute respiratory failure, unspecified whether with hypoxia or hypercapnia: Secondary | ICD-10-CM | POA: Diagnosis not present

## 2013-02-08 DIAGNOSIS — M6281 Muscle weakness (generalized): Secondary | ICD-10-CM | POA: Diagnosis not present

## 2013-02-08 DIAGNOSIS — J9600 Acute respiratory failure, unspecified whether with hypoxia or hypercapnia: Secondary | ICD-10-CM | POA: Diagnosis not present

## 2013-02-09 DIAGNOSIS — M6281 Muscle weakness (generalized): Secondary | ICD-10-CM | POA: Diagnosis not present

## 2013-02-09 DIAGNOSIS — J9600 Acute respiratory failure, unspecified whether with hypoxia or hypercapnia: Secondary | ICD-10-CM | POA: Diagnosis not present

## 2013-02-10 DIAGNOSIS — M6281 Muscle weakness (generalized): Secondary | ICD-10-CM | POA: Diagnosis not present

## 2013-02-10 DIAGNOSIS — J9600 Acute respiratory failure, unspecified whether with hypoxia or hypercapnia: Secondary | ICD-10-CM | POA: Diagnosis not present

## 2013-02-12 DIAGNOSIS — M6281 Muscle weakness (generalized): Secondary | ICD-10-CM | POA: Diagnosis not present

## 2013-02-12 DIAGNOSIS — J9600 Acute respiratory failure, unspecified whether with hypoxia or hypercapnia: Secondary | ICD-10-CM | POA: Diagnosis not present

## 2013-02-13 DIAGNOSIS — R918 Other nonspecific abnormal finding of lung field: Secondary | ICD-10-CM | POA: Diagnosis not present

## 2013-02-13 DIAGNOSIS — R222 Localized swelling, mass and lump, trunk: Secondary | ICD-10-CM | POA: Diagnosis not present

## 2013-02-15 DIAGNOSIS — J9600 Acute respiratory failure, unspecified whether with hypoxia or hypercapnia: Secondary | ICD-10-CM | POA: Diagnosis not present

## 2013-02-15 DIAGNOSIS — M6281 Muscle weakness (generalized): Secondary | ICD-10-CM | POA: Diagnosis not present

## 2013-02-16 ENCOUNTER — Other Ambulatory Visit: Payer: Self-pay | Admitting: Family Medicine

## 2013-02-16 DIAGNOSIS — M6281 Muscle weakness (generalized): Secondary | ICD-10-CM | POA: Diagnosis not present

## 2013-02-16 DIAGNOSIS — J9600 Acute respiratory failure, unspecified whether with hypoxia or hypercapnia: Secondary | ICD-10-CM | POA: Diagnosis not present

## 2013-02-19 DIAGNOSIS — M6281 Muscle weakness (generalized): Secondary | ICD-10-CM | POA: Diagnosis not present

## 2013-02-19 DIAGNOSIS — J9600 Acute respiratory failure, unspecified whether with hypoxia or hypercapnia: Secondary | ICD-10-CM | POA: Diagnosis not present

## 2013-02-23 DIAGNOSIS — J9600 Acute respiratory failure, unspecified whether with hypoxia or hypercapnia: Secondary | ICD-10-CM | POA: Diagnosis not present

## 2013-02-23 DIAGNOSIS — M6281 Muscle weakness (generalized): Secondary | ICD-10-CM | POA: Diagnosis not present

## 2013-02-24 DIAGNOSIS — I2510 Atherosclerotic heart disease of native coronary artery without angina pectoris: Secondary | ICD-10-CM

## 2013-02-24 DIAGNOSIS — E785 Hyperlipidemia, unspecified: Secondary | ICD-10-CM

## 2013-02-24 DIAGNOSIS — R079 Chest pain, unspecified: Secondary | ICD-10-CM | POA: Diagnosis not present

## 2013-02-24 DIAGNOSIS — I10 Essential (primary) hypertension: Secondary | ICD-10-CM

## 2013-02-27 DIAGNOSIS — J9600 Acute respiratory failure, unspecified whether with hypoxia or hypercapnia: Secondary | ICD-10-CM | POA: Diagnosis not present

## 2013-02-27 DIAGNOSIS — M6281 Muscle weakness (generalized): Secondary | ICD-10-CM | POA: Diagnosis not present

## 2013-03-02 DIAGNOSIS — J9600 Acute respiratory failure, unspecified whether with hypoxia or hypercapnia: Secondary | ICD-10-CM | POA: Diagnosis not present

## 2013-03-02 DIAGNOSIS — M6281 Muscle weakness (generalized): Secondary | ICD-10-CM | POA: Diagnosis not present

## 2013-03-08 DIAGNOSIS — M6281 Muscle weakness (generalized): Secondary | ICD-10-CM | POA: Diagnosis not present

## 2013-03-09 DIAGNOSIS — M6281 Muscle weakness (generalized): Secondary | ICD-10-CM | POA: Diagnosis not present

## 2013-03-10 ENCOUNTER — Other Ambulatory Visit: Payer: Self-pay | Admitting: Family Medicine

## 2013-03-12 DIAGNOSIS — M6281 Muscle weakness (generalized): Secondary | ICD-10-CM | POA: Diagnosis not present

## 2013-03-13 ENCOUNTER — Other Ambulatory Visit: Payer: Self-pay

## 2013-03-13 MED ORDER — CLOPIDOGREL BISULFATE 75 MG PO TABS: ORAL_TABLET | ORAL | Status: DC

## 2013-03-13 NOTE — Telephone Encounter (Signed)
Joy [name] pts daughter left v/m requesting refill plavix to [facility]; Joy [name] did not require cb. Done.

## 2013-03-14 DIAGNOSIS — M6281 Muscle weakness (generalized): Secondary | ICD-10-CM | POA: Diagnosis not present

## 2013-03-16 DIAGNOSIS — M6281 Muscle weakness (generalized): Secondary | ICD-10-CM | POA: Diagnosis not present

## 2013-03-26 ENCOUNTER — Other Ambulatory Visit: Payer: Self-pay

## 2013-03-26 MED ORDER — ATORVASTATIN CALCIUM 40 MG PO TABS: ORAL_TABLET | ORAL | Status: DC

## 2013-03-26 NOTE — Telephone Encounter (Signed)
[name] pts daughter request refill atorvastatin to [facility]; advised done.

## 2013-04-03 ENCOUNTER — Ambulatory Visit: Payer: Self-pay | Admitting: Internal Medicine

## 2013-04-03 ENCOUNTER — Emergency Department: Payer: Self-pay | Admitting: Internal Medicine

## 2013-04-03 DIAGNOSIS — F3289 Other specified depressive episodes: Secondary | ICD-10-CM | POA: Diagnosis not present

## 2013-04-03 DIAGNOSIS — F29 Unspecified psychosis not due to a substance or known physiological condition: Secondary | ICD-10-CM | POA: Diagnosis not present

## 2013-04-03 DIAGNOSIS — K2900 Acute gastritis without bleeding: Secondary | ICD-10-CM | POA: Diagnosis not present

## 2013-04-03 DIAGNOSIS — R110 Nausea: Secondary | ICD-10-CM | POA: Diagnosis not present

## 2013-04-03 DIAGNOSIS — I10 Essential (primary) hypertension: Secondary | ICD-10-CM | POA: Diagnosis not present

## 2013-04-03 DIAGNOSIS — F329 Major depressive disorder, single episode, unspecified: Secondary | ICD-10-CM | POA: Diagnosis not present

## 2013-04-03 LAB — COMPREHENSIVE METABOLIC PANEL
ALK PHOS: 103 U/L
ALT: 12 U/L (ref 12–78)
AST: 20 U/L (ref 15–37)
Albumin: 3 g/dL — ABNORMAL LOW (ref 3.4–5.0)
Anion Gap: 5 — ABNORMAL LOW (ref 7–16)
BUN: 26 mg/dL — AB (ref 7–18)
Bilirubin,Total: 1.6 mg/dL — ABNORMAL HIGH (ref 0.2–1.0)
Calcium, Total: 8.6 mg/dL (ref 8.5–10.1)
Chloride: 104 mmol/L (ref 98–107)
Co2: 30 mmol/L (ref 21–32)
Creatinine: 1.1 mg/dL (ref 0.60–1.30)
EGFR (Non-African Amer.): 47 — ABNORMAL LOW
GFR CALC AF AMER: 55 — AB
Glucose: 88 mg/dL (ref 65–99)
Osmolality: 282 (ref 275–301)
POTASSIUM: 4.2 mmol/L (ref 3.5–5.1)
Sodium: 139 mmol/L (ref 136–145)
Total Protein: 6.7 g/dL (ref 6.4–8.2)

## 2013-04-03 LAB — URINALYSIS, COMPLETE
BILIRUBIN, UR: NEGATIVE
BLOOD: NEGATIVE
Glucose,UR: NEGATIVE mg/dL (ref 0–75)
Leukocyte Esterase: NEGATIVE
Nitrite: NEGATIVE
Ph: 5 (ref 4.5–8.0)
Protein: NEGATIVE
RBC,UR: NONE SEEN /HPF (ref 0–5)
SQUAMOUS EPITHELIAL: NONE SEEN
Specific Gravity: 1.021 (ref 1.003–1.030)
WBC UR: 1 /HPF (ref 0–5)

## 2013-04-03 LAB — CBC
HCT: 33.5 % — AB (ref 35.0–47.0)
HGB: 10.8 g/dL — ABNORMAL LOW (ref 12.0–16.0)
MCH: 28.6 pg (ref 26.0–34.0)
MCHC: 32.3 g/dL (ref 32.0–36.0)
MCV: 88 fL (ref 80–100)
Platelet: 262 10*3/uL (ref 150–440)
RBC: 3.79 10*6/uL — ABNORMAL LOW (ref 3.80–5.20)
RDW: 16.6 % — ABNORMAL HIGH (ref 11.5–14.5)
WBC: 10.2 10*3/uL (ref 3.6–11.0)

## 2013-04-03 LAB — TSH: Thyroid Stimulating Horm: 1.53 u[IU]/mL

## 2013-04-03 LAB — LIPASE, BLOOD: LIPASE: 157 U/L (ref 73–393)

## 2013-04-06 ENCOUNTER — Inpatient Hospital Stay: Payer: Self-pay | Admitting: Internal Medicine

## 2013-04-06 DIAGNOSIS — R0602 Shortness of breath: Secondary | ICD-10-CM | POA: Diagnosis not present

## 2013-04-06 DIAGNOSIS — Z7902 Long term (current) use of antithrombotics/antiplatelets: Secondary | ICD-10-CM | POA: Diagnosis not present

## 2013-04-06 DIAGNOSIS — D649 Anemia, unspecified: Secondary | ICD-10-CM | POA: Diagnosis present

## 2013-04-06 DIAGNOSIS — R4182 Altered mental status, unspecified: Secondary | ICD-10-CM | POA: Diagnosis not present

## 2013-04-06 DIAGNOSIS — F329 Major depressive disorder, single episode, unspecified: Secondary | ICD-10-CM | POA: Diagnosis present

## 2013-04-06 DIAGNOSIS — Z7982 Long term (current) use of aspirin: Secondary | ICD-10-CM | POA: Diagnosis not present

## 2013-04-06 DIAGNOSIS — R5383 Other fatigue: Secondary | ICD-10-CM | POA: Diagnosis not present

## 2013-04-06 DIAGNOSIS — G609 Hereditary and idiopathic neuropathy, unspecified: Secondary | ICD-10-CM | POA: Diagnosis present

## 2013-04-06 DIAGNOSIS — J9620 Acute and chronic respiratory failure, unspecified whether with hypoxia or hypercapnia: Secondary | ICD-10-CM | POA: Diagnosis not present

## 2013-04-06 DIAGNOSIS — F3289 Other specified depressive episodes: Secondary | ICD-10-CM | POA: Diagnosis present

## 2013-04-06 DIAGNOSIS — M6281 Muscle weakness (generalized): Secondary | ICD-10-CM | POA: Diagnosis not present

## 2013-04-06 DIAGNOSIS — R1110 Vomiting, unspecified: Secondary | ICD-10-CM | POA: Diagnosis not present

## 2013-04-06 DIAGNOSIS — E039 Hypothyroidism, unspecified: Secondary | ICD-10-CM | POA: Diagnosis present

## 2013-04-06 DIAGNOSIS — G9340 Encephalopathy, unspecified: Secondary | ICD-10-CM | POA: Diagnosis not present

## 2013-04-06 DIAGNOSIS — E669 Obesity, unspecified: Secondary | ICD-10-CM | POA: Diagnosis present

## 2013-04-06 DIAGNOSIS — F0390 Unspecified dementia without behavioral disturbance: Secondary | ICD-10-CM | POA: Diagnosis present

## 2013-04-06 DIAGNOSIS — Z6835 Body mass index (BMI) 35.0-35.9, adult: Secondary | ICD-10-CM | POA: Diagnosis not present

## 2013-04-06 DIAGNOSIS — N183 Chronic kidney disease, stage 3 unspecified: Secondary | ICD-10-CM | POA: Diagnosis present

## 2013-04-06 DIAGNOSIS — N179 Acute kidney failure, unspecified: Secondary | ICD-10-CM | POA: Diagnosis not present

## 2013-04-06 DIAGNOSIS — I129 Hypertensive chronic kidney disease with stage 1 through stage 4 chronic kidney disease, or unspecified chronic kidney disease: Secondary | ICD-10-CM | POA: Diagnosis not present

## 2013-04-06 DIAGNOSIS — J180 Bronchopneumonia, unspecified organism: Secondary | ICD-10-CM | POA: Diagnosis not present

## 2013-04-06 DIAGNOSIS — M353 Polymyalgia rheumatica: Secondary | ICD-10-CM | POA: Diagnosis present

## 2013-04-06 DIAGNOSIS — J449 Chronic obstructive pulmonary disease, unspecified: Secondary | ICD-10-CM | POA: Diagnosis present

## 2013-04-06 DIAGNOSIS — R5381 Other malaise: Secondary | ICD-10-CM | POA: Diagnosis not present

## 2013-04-06 DIAGNOSIS — R259 Unspecified abnormal involuntary movements: Secondary | ICD-10-CM | POA: Diagnosis not present

## 2013-04-06 DIAGNOSIS — J189 Pneumonia, unspecified organism: Secondary | ICD-10-CM | POA: Diagnosis not present

## 2013-04-06 DIAGNOSIS — Z9981 Dependence on supplemental oxygen: Secondary | ICD-10-CM | POA: Diagnosis not present

## 2013-04-06 DIAGNOSIS — I10 Essential (primary) hypertension: Secondary | ICD-10-CM | POA: Diagnosis not present

## 2013-04-06 DIAGNOSIS — I6529 Occlusion and stenosis of unspecified carotid artery: Secondary | ICD-10-CM | POA: Diagnosis present

## 2013-04-06 DIAGNOSIS — R262 Difficulty in walking, not elsewhere classified: Secondary | ICD-10-CM | POA: Diagnosis not present

## 2013-04-06 LAB — IRON AND TIBC
IRON SATURATION: 13 %
Iron Bind.Cap.(Total): 223 ug/dL — ABNORMAL LOW (ref 250–450)
Iron: 30 ug/dL — ABNORMAL LOW (ref 50–170)
Unbound Iron-Bind.Cap.: 193 ug/dL

## 2013-04-06 LAB — COMPREHENSIVE METABOLIC PANEL
ANION GAP: 4 — AB (ref 7–16)
AST: 23 U/L (ref 15–37)
Albumin: 2.5 g/dL — ABNORMAL LOW (ref 3.4–5.0)
Alkaline Phosphatase: 87 U/L
BUN: 33 mg/dL — ABNORMAL HIGH (ref 7–18)
Bilirubin,Total: 0.6 mg/dL (ref 0.2–1.0)
CALCIUM: 8.3 mg/dL — AB (ref 8.5–10.1)
Chloride: 105 mmol/L (ref 98–107)
Co2: 30 mmol/L (ref 21–32)
Creatinine: 1.67 mg/dL — ABNORMAL HIGH (ref 0.60–1.30)
EGFR (African American): 33 — ABNORMAL LOW
EGFR (Non-African Amer.): 29 — ABNORMAL LOW
GLUCOSE: 117 mg/dL — AB (ref 65–99)
Osmolality: 286 (ref 275–301)
Potassium: 4.4 mmol/L (ref 3.5–5.1)
SGPT (ALT): 12 U/L (ref 12–78)
SODIUM: 139 mmol/L (ref 136–145)
TOTAL PROTEIN: 6.2 g/dL — AB (ref 6.4–8.2)

## 2013-04-06 LAB — TROPONIN I: TROPONIN-I: 0.04 ng/mL

## 2013-04-06 LAB — CBC
HCT: 28.5 % — ABNORMAL LOW (ref 35.0–47.0)
HGB: 9.6 g/dL — AB (ref 12.0–16.0)
MCH: 29.8 pg (ref 26.0–34.0)
MCHC: 33.6 g/dL (ref 32.0–36.0)
MCV: 89 fL (ref 80–100)
Platelet: 297 10*3/uL (ref 150–440)
RBC: 3.21 10*6/uL — ABNORMAL LOW (ref 3.80–5.20)
RDW: 16.6 % — ABNORMAL HIGH (ref 11.5–14.5)
WBC: 8.7 10*3/uL (ref 3.6–11.0)

## 2013-04-06 LAB — AMMONIA: Ammonia, Plasma: 26 mcmol/L (ref 11–32)

## 2013-04-06 LAB — FOLATE: Folic Acid: 50.7 ng/mL (ref 3.1–100.0)

## 2013-04-06 LAB — CK TOTAL AND CKMB (NOT AT ARMC)
CK, Total: 79 U/L
CK-MB: 1.2 ng/mL (ref 0.5–3.6)

## 2013-04-06 LAB — FERRITIN: Ferritin (ARMC): 249 ng/mL (ref 8–388)

## 2013-04-07 ENCOUNTER — Ambulatory Visit: Payer: Self-pay | Admitting: Neurology

## 2013-04-07 LAB — CBC WITH DIFFERENTIAL/PLATELET
BASOS PCT: 0.5 %
Basophil #: 0 10*3/uL (ref 0.0–0.1)
EOS PCT: 1.3 %
Eosinophil #: 0.1 10*3/uL (ref 0.0–0.7)
HCT: 27.5 % — AB (ref 35.0–47.0)
HGB: 9.2 g/dL — ABNORMAL LOW (ref 12.0–16.0)
LYMPHS ABS: 0.5 10*3/uL — AB (ref 1.0–3.6)
LYMPHS PCT: 5.3 %
MCH: 29.5 pg (ref 26.0–34.0)
MCHC: 33.5 g/dL (ref 32.0–36.0)
MCV: 88 fL (ref 80–100)
MONO ABS: 1 x10 3/mm — AB (ref 0.2–0.9)
Monocyte %: 11.2 %
NEUTROS ABS: 7.4 10*3/uL — AB (ref 1.4–6.5)
Neutrophil %: 81.7 %
PLATELETS: 316 10*3/uL (ref 150–440)
RBC: 3.12 10*6/uL — ABNORMAL LOW (ref 3.80–5.20)
RDW: 16.7 % — ABNORMAL HIGH (ref 11.5–14.5)
WBC: 9.1 10*3/uL (ref 3.6–11.0)

## 2013-04-07 LAB — BASIC METABOLIC PANEL
ANION GAP: 4 — AB (ref 7–16)
BUN: 27 mg/dL — AB (ref 7–18)
Calcium, Total: 8.1 mg/dL — ABNORMAL LOW (ref 8.5–10.1)
Chloride: 104 mmol/L (ref 98–107)
Co2: 30 mmol/L (ref 21–32)
Creatinine: 1.45 mg/dL — ABNORMAL HIGH (ref 0.60–1.30)
GFR CALC AF AMER: 39 — AB
GFR CALC NON AF AMER: 34 — AB
Glucose: 163 mg/dL — ABNORMAL HIGH (ref 65–99)
Osmolality: 284 (ref 275–301)
Potassium: 4.4 mmol/L (ref 3.5–5.1)
SODIUM: 138 mmol/L (ref 136–145)

## 2013-04-08 LAB — BASIC METABOLIC PANEL
Anion Gap: 5 — ABNORMAL LOW (ref 7–16)
BUN: 22 mg/dL — AB (ref 7–18)
CHLORIDE: 107 mmol/L (ref 98–107)
CO2: 29 mmol/L (ref 21–32)
CREATININE: 1.34 mg/dL — AB (ref 0.60–1.30)
Calcium, Total: 8.3 mg/dL — ABNORMAL LOW (ref 8.5–10.1)
EGFR (African American): 43 — ABNORMAL LOW
EGFR (Non-African Amer.): 37 — ABNORMAL LOW
Glucose: 113 mg/dL — ABNORMAL HIGH (ref 65–99)
Osmolality: 285 (ref 275–301)
POTASSIUM: 4.2 mmol/L (ref 3.5–5.1)
SODIUM: 141 mmol/L (ref 136–145)

## 2013-04-10 DIAGNOSIS — F3289 Other specified depressive episodes: Secondary | ICD-10-CM | POA: Diagnosis not present

## 2013-04-10 DIAGNOSIS — R0602 Shortness of breath: Secondary | ICD-10-CM | POA: Diagnosis not present

## 2013-04-10 DIAGNOSIS — M6281 Muscle weakness (generalized): Secondary | ICD-10-CM | POA: Diagnosis not present

## 2013-04-10 DIAGNOSIS — N183 Chronic kidney disease, stage 3 unspecified: Secondary | ICD-10-CM | POA: Diagnosis not present

## 2013-04-10 DIAGNOSIS — R262 Difficulty in walking, not elsewhere classified: Secondary | ICD-10-CM | POA: Diagnosis not present

## 2013-04-10 DIAGNOSIS — R259 Unspecified abnormal involuntary movements: Secondary | ICD-10-CM | POA: Diagnosis not present

## 2013-04-10 DIAGNOSIS — J9620 Acute and chronic respiratory failure, unspecified whether with hypoxia or hypercapnia: Secondary | ICD-10-CM | POA: Diagnosis not present

## 2013-04-10 DIAGNOSIS — F329 Major depressive disorder, single episode, unspecified: Secondary | ICD-10-CM | POA: Diagnosis not present

## 2013-04-10 DIAGNOSIS — J449 Chronic obstructive pulmonary disease, unspecified: Secondary | ICD-10-CM | POA: Diagnosis not present

## 2013-04-10 DIAGNOSIS — K219 Gastro-esophageal reflux disease without esophagitis: Secondary | ICD-10-CM | POA: Diagnosis not present

## 2013-04-10 DIAGNOSIS — Z9981 Dependence on supplemental oxygen: Secondary | ICD-10-CM | POA: Diagnosis not present

## 2013-04-10 DIAGNOSIS — J189 Pneumonia, unspecified organism: Secondary | ICD-10-CM | POA: Diagnosis not present

## 2013-04-10 DIAGNOSIS — R4182 Altered mental status, unspecified: Secondary | ICD-10-CM | POA: Diagnosis not present

## 2013-04-10 DIAGNOSIS — N179 Acute kidney failure, unspecified: Secondary | ICD-10-CM | POA: Diagnosis not present

## 2013-04-10 DIAGNOSIS — J180 Bronchopneumonia, unspecified organism: Secondary | ICD-10-CM | POA: Diagnosis not present

## 2013-04-10 DIAGNOSIS — R5381 Other malaise: Secondary | ICD-10-CM | POA: Diagnosis not present

## 2013-04-10 DIAGNOSIS — I129 Hypertensive chronic kidney disease with stage 1 through stage 4 chronic kidney disease, or unspecified chronic kidney disease: Secondary | ICD-10-CM | POA: Diagnosis not present

## 2013-04-10 LAB — VANCOMYCIN, TROUGH: Vancomycin, Trough: 6 ug/mL — ABNORMAL LOW (ref 10–20)

## 2013-04-10 LAB — HEMOGLOBIN: HGB: 9.5 g/dL — ABNORMAL LOW (ref 12.0–16.0)

## 2013-04-11 DIAGNOSIS — J449 Chronic obstructive pulmonary disease, unspecified: Secondary | ICD-10-CM

## 2013-04-11 DIAGNOSIS — N183 Chronic kidney disease, stage 3 unspecified: Secondary | ICD-10-CM

## 2013-04-11 DIAGNOSIS — J9620 Acute and chronic respiratory failure, unspecified whether with hypoxia or hypercapnia: Secondary | ICD-10-CM | POA: Diagnosis not present

## 2013-04-11 DIAGNOSIS — J180 Bronchopneumonia, unspecified organism: Secondary | ICD-10-CM | POA: Diagnosis not present

## 2013-04-11 DIAGNOSIS — G9340 Encephalopathy, unspecified: Secondary | ICD-10-CM

## 2013-04-13 DIAGNOSIS — F3289 Other specified depressive episodes: Secondary | ICD-10-CM

## 2013-04-13 DIAGNOSIS — J180 Bronchopneumonia, unspecified organism: Secondary | ICD-10-CM | POA: Diagnosis not present

## 2013-04-13 DIAGNOSIS — F329 Major depressive disorder, single episode, unspecified: Secondary | ICD-10-CM

## 2013-04-13 LAB — CULTURE, BLOOD (SINGLE)

## 2013-04-18 DIAGNOSIS — F3289 Other specified depressive episodes: Secondary | ICD-10-CM | POA: Diagnosis not present

## 2013-04-18 DIAGNOSIS — F329 Major depressive disorder, single episode, unspecified: Secondary | ICD-10-CM | POA: Diagnosis not present

## 2013-04-18 DIAGNOSIS — J180 Bronchopneumonia, unspecified organism: Secondary | ICD-10-CM | POA: Diagnosis not present

## 2013-04-25 DIAGNOSIS — F329 Major depressive disorder, single episode, unspecified: Secondary | ICD-10-CM | POA: Diagnosis not present

## 2013-04-25 DIAGNOSIS — J449 Chronic obstructive pulmonary disease, unspecified: Secondary | ICD-10-CM | POA: Diagnosis not present

## 2013-04-25 DIAGNOSIS — K219 Gastro-esophageal reflux disease without esophagitis: Secondary | ICD-10-CM

## 2013-04-25 DIAGNOSIS — F3289 Other specified depressive episodes: Secondary | ICD-10-CM | POA: Diagnosis not present

## 2013-05-02 ENCOUNTER — Other Ambulatory Visit: Payer: Self-pay | Admitting: Family Medicine

## 2013-05-07 DIAGNOSIS — R262 Difficulty in walking, not elsewhere classified: Secondary | ICD-10-CM | POA: Diagnosis not present

## 2013-05-07 DIAGNOSIS — M6281 Muscle weakness (generalized): Secondary | ICD-10-CM | POA: Diagnosis not present

## 2013-05-08 ENCOUNTER — Other Ambulatory Visit: Payer: Self-pay | Admitting: Family Medicine

## 2013-05-09 DIAGNOSIS — M6281 Muscle weakness (generalized): Secondary | ICD-10-CM | POA: Diagnosis not present

## 2013-05-09 DIAGNOSIS — R262 Difficulty in walking, not elsewhere classified: Secondary | ICD-10-CM | POA: Diagnosis not present

## 2013-05-10 DIAGNOSIS — M6281 Muscle weakness (generalized): Secondary | ICD-10-CM | POA: Diagnosis not present

## 2013-05-10 DIAGNOSIS — R262 Difficulty in walking, not elsewhere classified: Secondary | ICD-10-CM | POA: Diagnosis not present

## 2013-05-14 DIAGNOSIS — M6281 Muscle weakness (generalized): Secondary | ICD-10-CM | POA: Diagnosis not present

## 2013-05-14 DIAGNOSIS — R262 Difficulty in walking, not elsewhere classified: Secondary | ICD-10-CM | POA: Diagnosis not present

## 2013-05-16 DIAGNOSIS — R262 Difficulty in walking, not elsewhere classified: Secondary | ICD-10-CM | POA: Diagnosis not present

## 2013-05-16 DIAGNOSIS — M6281 Muscle weakness (generalized): Secondary | ICD-10-CM | POA: Diagnosis not present

## 2013-05-17 DIAGNOSIS — R262 Difficulty in walking, not elsewhere classified: Secondary | ICD-10-CM | POA: Diagnosis not present

## 2013-05-17 DIAGNOSIS — M6281 Muscle weakness (generalized): Secondary | ICD-10-CM | POA: Diagnosis not present

## 2013-05-21 ENCOUNTER — Other Ambulatory Visit: Payer: Self-pay | Admitting: Family Medicine

## 2013-05-21 DIAGNOSIS — M6281 Muscle weakness (generalized): Secondary | ICD-10-CM | POA: Diagnosis not present

## 2013-05-21 DIAGNOSIS — R262 Difficulty in walking, not elsewhere classified: Secondary | ICD-10-CM | POA: Diagnosis not present

## 2013-05-23 DIAGNOSIS — M6281 Muscle weakness (generalized): Secondary | ICD-10-CM | POA: Diagnosis not present

## 2013-05-23 DIAGNOSIS — R262 Difficulty in walking, not elsewhere classified: Secondary | ICD-10-CM | POA: Diagnosis not present

## 2013-05-24 DIAGNOSIS — R262 Difficulty in walking, not elsewhere classified: Secondary | ICD-10-CM | POA: Diagnosis not present

## 2013-05-24 DIAGNOSIS — M6281 Muscle weakness (generalized): Secondary | ICD-10-CM | POA: Diagnosis not present

## 2013-05-30 IMAGING — CR DG CHEST 2V
2 series · 2 of 2 positions shown · non-contrast
Comparison: None.

CLINICAL DATA: Shortness of breath and chest pain.

CHEST - 2 VIEW

[view not recorded (1 of 2)]
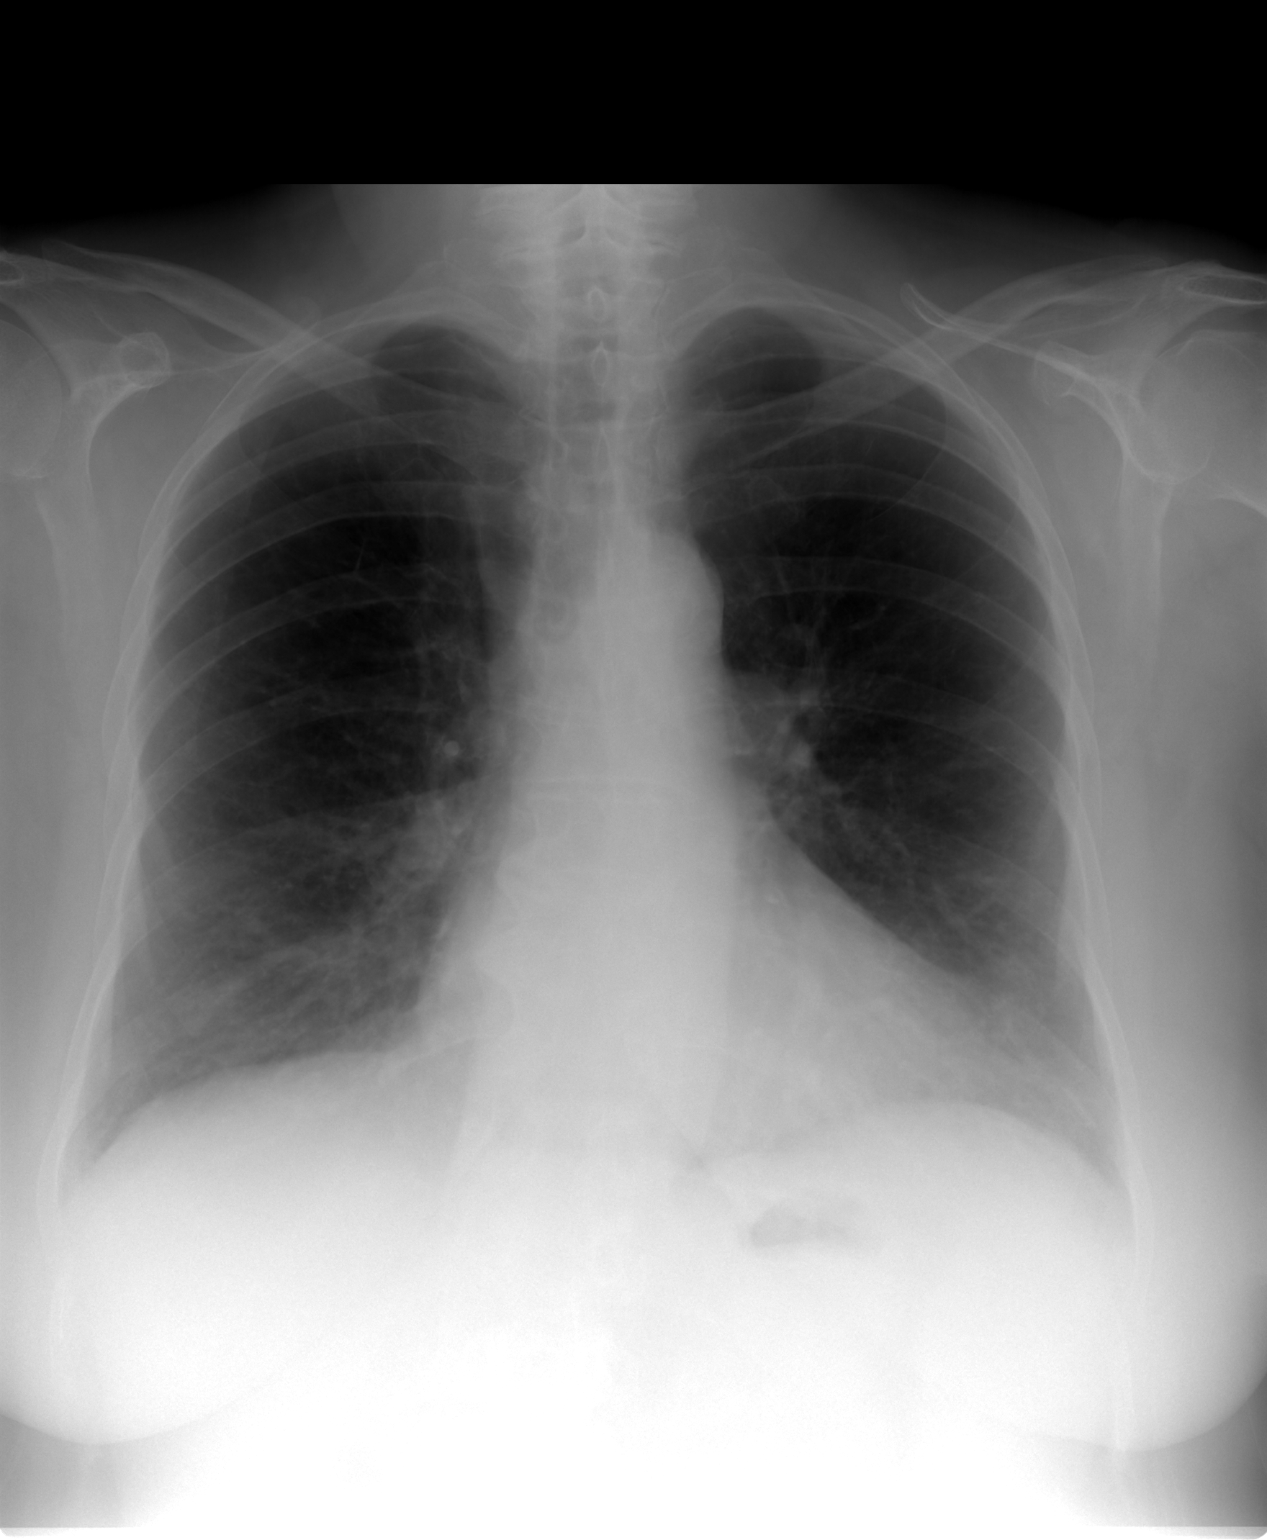

[view not recorded (2 of 2)]
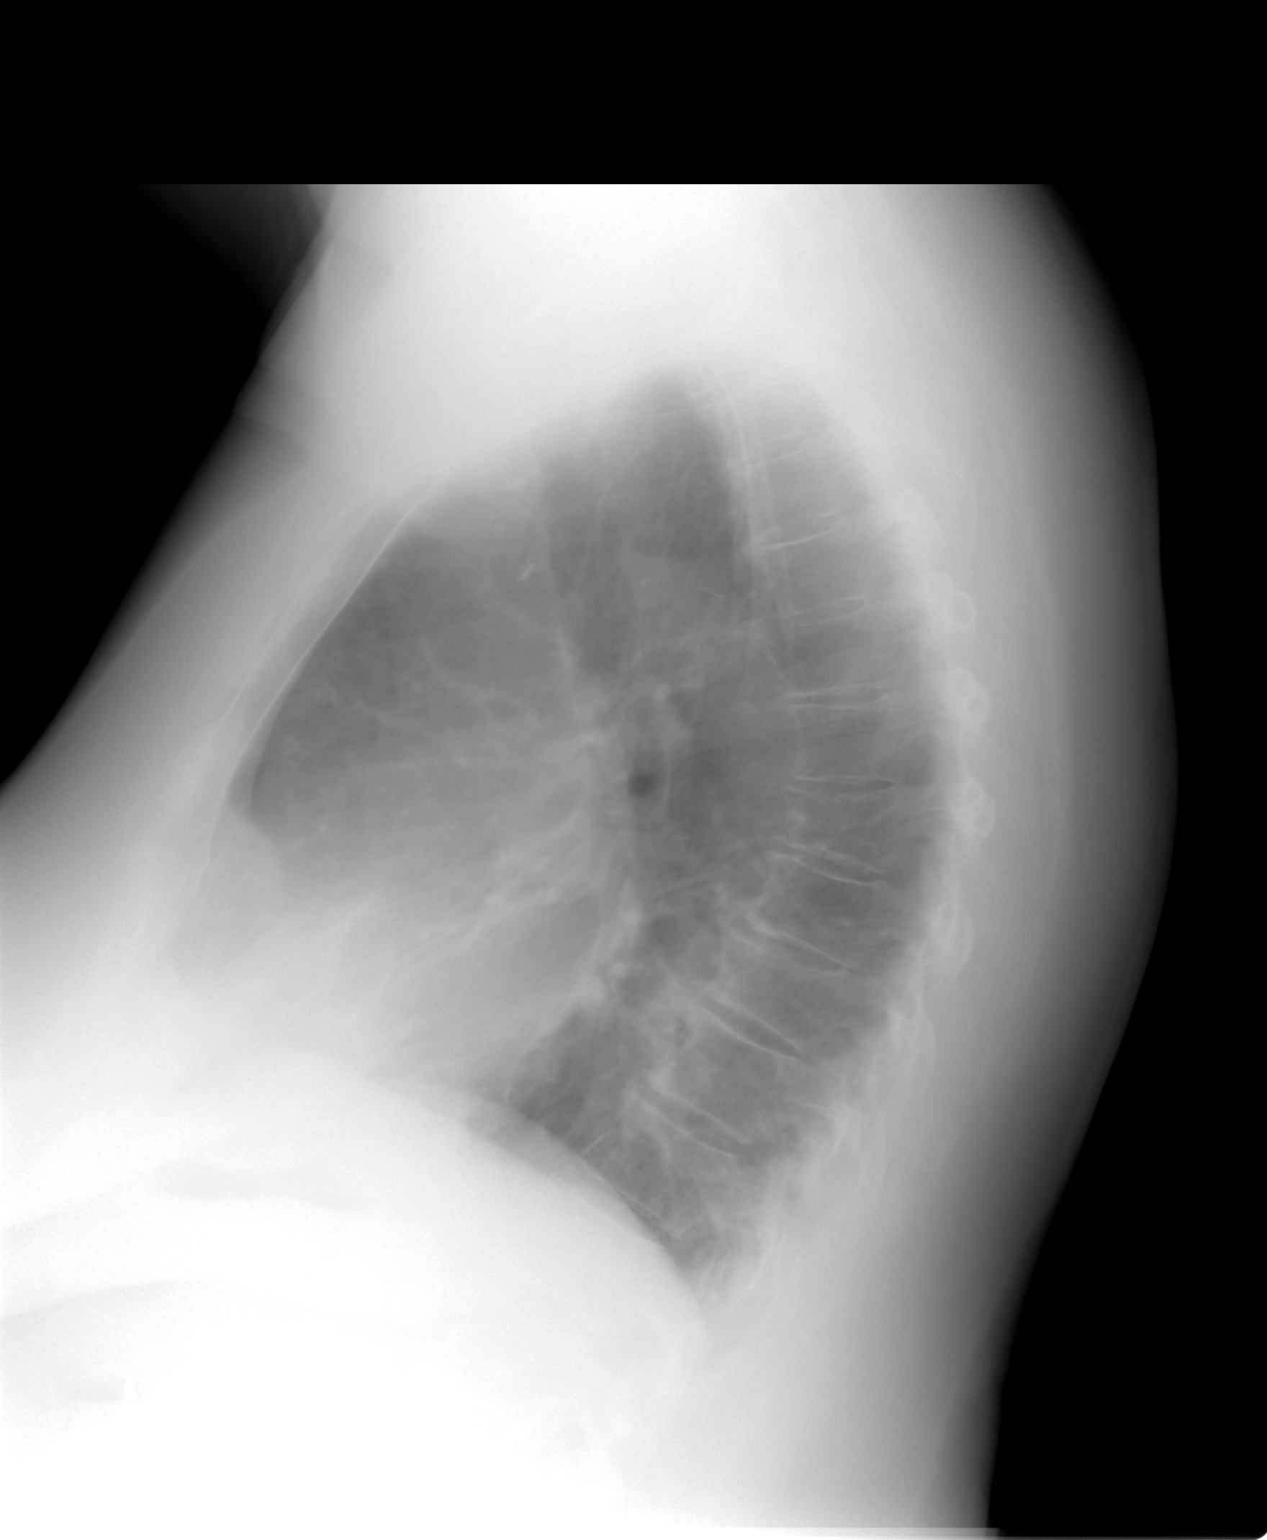

[2 of 2 positions shown; findings below may reference images not displayed]

FINDINGS: There are mildly accentuated bronchial markings.  There
are no infiltrates or edematous changes.  The heart is normal in
size.  There is no evidence for mediastinal or hilar adenopathy.
IMPRESSION: Mildly accentuated bronchial markings most consistent with chronic
bronchitic change.  No acute infiltrates.

## 2013-05-31 ENCOUNTER — Ambulatory Visit: Payer: Self-pay | Admitting: Internal Medicine

## 2013-05-31 DIAGNOSIS — J449 Chronic obstructive pulmonary disease, unspecified: Secondary | ICD-10-CM | POA: Diagnosis not present

## 2013-05-31 DIAGNOSIS — R0602 Shortness of breath: Secondary | ICD-10-CM | POA: Diagnosis not present

## 2013-05-31 DIAGNOSIS — J438 Other emphysema: Secondary | ICD-10-CM | POA: Diagnosis not present

## 2013-05-31 DIAGNOSIS — J189 Pneumonia, unspecified organism: Secondary | ICD-10-CM | POA: Diagnosis not present

## 2013-05-31 DIAGNOSIS — I2789 Other specified pulmonary heart diseases: Secondary | ICD-10-CM | POA: Diagnosis not present

## 2013-05-31 DIAGNOSIS — J9610 Chronic respiratory failure, unspecified whether with hypoxia or hypercapnia: Secondary | ICD-10-CM | POA: Diagnosis not present

## 2013-06-14 ENCOUNTER — Telehealth: Payer: Self-pay | Admitting: Family Medicine

## 2013-06-14 NOTE — Telephone Encounter (Signed)
Form signed but I am unsure of make/model/flow rate.

## 2013-06-14 NOTE — Telephone Encounter (Signed)
Pt's daughter called and Pt Joy [name] is getting on a plane at [place] within the hour.  She needs a statement from PCP stating medical necessity for oxygen 24 hours a day.  Daughter was unaware that she needed a letter.  Please Fax to airport [phone] Attn: Simona Huh  / lt  We are have the form from the airlines for your signature / lt

## 2013-06-25 ENCOUNTER — Telehealth: Payer: Self-pay | Admitting: Family Medicine

## 2013-06-25 DIAGNOSIS — R0602 Shortness of breath: Secondary | ICD-10-CM | POA: Diagnosis not present

## 2013-06-25 NOTE — Telephone Encounter (Signed)
Ok thank you for letting me know.

## 2013-06-25 NOTE — Telephone Encounter (Signed)
Patient's daughter,Katie, called.  Patient fell twice in [place].  Her knees gave out the first time and the second time she took her oxygen off to take a shower and her knees gave out.  Patient's using a walker.  Patient has been confused. Patient's daughter thinks she might have a UTI.  Patient's daughter made an appointment with [name] on 06/27/13 because you didn't have an opening.  Patient's daughter wanted to make you aware that patient's seeing Rollene Fare.

## 2013-06-27 ENCOUNTER — Ambulatory Visit: Payer: Self-pay | Admitting: Internal Medicine

## 2013-06-27 DIAGNOSIS — R0602 Shortness of breath: Secondary | ICD-10-CM | POA: Diagnosis not present

## 2013-06-28 DIAGNOSIS — R0602 Shortness of breath: Secondary | ICD-10-CM | POA: Diagnosis not present

## 2013-06-28 DIAGNOSIS — J449 Chronic obstructive pulmonary disease, unspecified: Secondary | ICD-10-CM | POA: Diagnosis not present

## 2013-06-28 DIAGNOSIS — J9610 Chronic respiratory failure, unspecified whether with hypoxia or hypercapnia: Secondary | ICD-10-CM | POA: Diagnosis not present

## 2013-06-28 DIAGNOSIS — J438 Other emphysema: Secondary | ICD-10-CM | POA: Diagnosis not present

## 2013-06-28 DIAGNOSIS — I517 Cardiomegaly: Secondary | ICD-10-CM | POA: Diagnosis not present

## 2013-06-30 ENCOUNTER — Other Ambulatory Visit: Payer: Self-pay | Admitting: Family Medicine

## 2013-07-07 ENCOUNTER — Other Ambulatory Visit: Payer: Self-pay | Admitting: Family Medicine

## 2013-07-14 ENCOUNTER — Other Ambulatory Visit: Payer: Self-pay | Admitting: Family Medicine

## 2013-07-17 ENCOUNTER — Ambulatory Visit: Payer: Self-pay | Admitting: Family Medicine

## 2013-07-21 ENCOUNTER — Other Ambulatory Visit: Payer: Self-pay | Admitting: Family Medicine

## 2013-07-23 DIAGNOSIS — G4720 Circadian rhythm sleep disorder, unspecified type: Secondary | ICD-10-CM | POA: Diagnosis not present

## 2013-07-23 DIAGNOSIS — G4710 Hypersomnia, unspecified: Secondary | ICD-10-CM | POA: Diagnosis not present

## 2013-08-06 ENCOUNTER — Other Ambulatory Visit: Payer: Self-pay | Admitting: Family Medicine

## 2013-08-06 DIAGNOSIS — J438 Other emphysema: Secondary | ICD-10-CM | POA: Diagnosis not present

## 2013-08-06 DIAGNOSIS — I517 Cardiomegaly: Secondary | ICD-10-CM | POA: Diagnosis not present

## 2013-08-06 DIAGNOSIS — J9610 Chronic respiratory failure, unspecified whether with hypoxia or hypercapnia: Secondary | ICD-10-CM | POA: Diagnosis not present

## 2013-08-06 DIAGNOSIS — I2789 Other specified pulmonary heart diseases: Secondary | ICD-10-CM | POA: Diagnosis not present

## 2013-08-06 DIAGNOSIS — J449 Chronic obstructive pulmonary disease, unspecified: Secondary | ICD-10-CM | POA: Diagnosis not present

## 2013-08-08 ENCOUNTER — Other Ambulatory Visit: Payer: Self-pay | Admitting: Family Medicine

## 2013-08-08 ENCOUNTER — Encounter: Payer: Self-pay | Admitting: Family Medicine

## 2013-08-08 ENCOUNTER — Ambulatory Visit (INDEPENDENT_AMBULATORY_CARE_PROVIDER_SITE_OTHER): Payer: Medicare Other | Admitting: Family Medicine

## 2013-08-08 VITALS — BP 114/60 | HR 79 | Temp 98.1°F | Wt 198.2 lb

## 2013-08-08 DIAGNOSIS — R5381 Other malaise: Secondary | ICD-10-CM

## 2013-08-08 DIAGNOSIS — R5383 Other fatigue: Secondary | ICD-10-CM | POA: Diagnosis not present

## 2013-08-08 DIAGNOSIS — I2510 Atherosclerotic heart disease of native coronary artery without angina pectoris: Secondary | ICD-10-CM | POA: Diagnosis not present

## 2013-08-08 DIAGNOSIS — E550 Rickets, active: Secondary | ICD-10-CM

## 2013-08-08 DIAGNOSIS — R531 Weakness: Secondary | ICD-10-CM

## 2013-08-08 NOTE — Progress Notes (Signed)
80 yo with h/o COPD, depression, MELAS and interstitial lung disease here with her daughter [name] to discuss "generalized weakness."   Recently returned from trip to [place].  While she was there, had two episodes, where her "knees buckled" and she fell to the floor.  Prior to those episodes, she said she "felt weak all over."  No CP or SOB.  She did not feel dizzy but could slowly feel her knees giving out, bilaterally.  One side not weaker than the other.  No blurred vision.  She is on chronic O2.  Saw Dr. Humphrey Rolls when she returned.  Per daughter, echo done which showed one of her atria not working properly.  Sleep study was neg.  Starting pulmonary rehab soon.  She did have some episodes of confusion but that resolved.  No dysuria.  Ms. Tesh denies any pain or weakness today.   Lab Results  Component Value Date   WBC 10.5 05/25/2012   HGB 12.0 05/25/2012   HCT 36.2 05/25/2012   MCV 85.0 05/25/2012   PLT 221.0 05/25/2012   Lab Results  Component Value Date   TSH 2.23 05/25/2012    Patient Active Problem List   Diagnosis Date Noted  . CAD (coronary artery disease)   . HLD (hyperlipidemia)   . Lung nodule 01/15/2013  . Memory loss 01/15/2013  . Decreased appetite 01/15/2013  . Weakness generalized 12/26/2012  . Incontinence 12/26/2012  . Dysuria 12/01/2012  . Yeast vaginitis 12/01/2012  . Vulvar irritation 09/15/2012  . GERD (gastroesophageal reflux disease) 09/06/2012  . Neuropathy 01/14/2011  . Insomnia 01/14/2011  . 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 HTN (hypertension)   . COPD (chronic obstructive pulmonary disease)   . Depression   . Thyroid disease    Past Medical History  Diagnosis Date  . HTN (hypertension)   . COPD (chronic obstructive pulmonary disease)   . Depression   . Thyroid disease   . PMR (polymyalgia rheumatica)     with h/o steroid induced myopathy  . Anxiety   . Diastolic dysfunction    Echo 05/11/2010, EF 65-70%  . TIA (transient ischemic attack)     11/05 s/p L CEA  . Kidney infection   . Carotid artery occlusion   . CAD (coronary artery disease)     h/o NSTEMI, LAD stent placement 10/04, cath 05/11/2010  . HLD (hyperlipidemia)    Past Surgical History  Procedure Laterality Date  . Cholecystectomy    . Carotid endarterectomy      left with stent placement 11/2003  . Iliac artery stent      right, 2001  . Cardiac catheterization      [facility]  . Cardiac catheterization    . Cardiac catheterization    . Lung biopsy     History  Substance Use Topics  . Smoking status: Former Smoker -- 1.00 packs/day for 25 years    Types: Cigarettes    Quit date: 01/05/1996  . Smokeless tobacco: Never Used  . Alcohol Use: No   No family history on file. Allergies  Allergen Reactions  . Codeine Other (See Comments)    Coma for 2 days  . Prednisone Other (See Comments)    Weight gain,puffy face  . Shellfish Allergy    Current Outpatient Prescriptions on File Prior to Visit  Medication Sig Dispense Refill  . ADVAIR DISKUS 250-50 MCG/DOSE AEPB INHALE 1 PUFF INTO THE LUNGS 2 (TWO) TIMES DAILY.  60 each  5  .  albuterol (PROVENTIL HFA;VENTOLIN HFA) 108 (90 BASE) MCG/ACT inhaler Inhale 2 puffs into the lungs every 6 (six) hours as needed for wheezing.  1 Inhaler  0  . amLODipine (NORVASC) 5 MG tablet TAKE 1 TABLET BY MOUTH EVERY DAY  90 tablet  0  . aspirin EC 81 MG tablet Take 81 mg by mouth daily.        Marland Kitchen atorvastatin (LIPITOR) 40 MG tablet TAKE 1 TABLET BY MOUTH EVERY DAY  90 tablet  1  . clopidogrel (PLAVIX) 75 MG tablet TAKE 1 TABLET BY MOUTH DAILY  30 tablet  0  . levothyroxine (SYNTHROID, LEVOTHROID) 25 MCG tablet TAKE 1 TABLET EVERY DAY  30 tablet  0  . lisinopril (PRINIVIL,ZESTRIL) 10 MG tablet TAKE 2 TABLETS (20 MG TOTAL) BY MOUTH DAILY.  60 tablet  5  . nitroGLYCERIN (NITROSTAT) 0.4 MG SL tablet Place 0.4 mg under the tongue every 5 (five) minutes as needed.        .  ranitidine (ZANTAC) 300 MG tablet TAKE 1 TABLET (300 MG TOTAL) BY MOUTH AT BEDTIME.  90 tablet  1  . sertraline (ZOLOFT) 100 MG tablet TAKE 1 TABLET BY MOUTH EVERY DAY  30 tablet  0  . tiotropium (SPIRIVA HANDIHALER) 18 MCG inhalation capsule Place 1 capsule (18 mcg total) into inhaler and inhale daily.  90 capsule  1  . traZODone (DESYREL) 50 MG tablet TAKE 1/2 TO 1 TABLET AT BEDTIME AS NEEDED FOR SLEEP  30 tablet  0   No current facility-administered medications on file prior to visit.   The PMH, PSH, Social History, Family History, Medications, and allergies have been reviewed in [facility], and have been updated if relevant.     ROS: See HPI No dysuria. No CP No SOB No changes in bowel habits She is not sure if she had blood in her stool No facial droop No left or right sided UE weakness No radiculopathy No swelling of knees  Physical exam: BP 114/60  Pulse 79  Temp(Src) 98.1 F (36.7 C) (Oral)  Wt 198 lb 4 oz (89.926 kg)  SpO2 93%   General:  Well-developed,well-nourished,in no acute distress; alert,appropriate and cooperative throughout examination Does appear more pale today Head:  normocephalic and atraumatic.   Eyes:  vision grossly intact, pupils equal, pupils round, and pupils reactive to light.   Ears:  R ear normal and L ear normal.   Nose:  no external deformity, pos O2 per [place] Mouth:  good dentition Lungs:  Normal respiratory effort, chest expands symmetrically.  Heart:  Normal rate and regular rhythm. S1 and S2 normal without gallop, murmur, click, rub or other extra sounds. No LE edema Skin:  Intact without suspicious lesions or rashes Psych:  Cognition and judgment appear intact. Alert and cooperative with normal attention span and concentration. No apparent delusions, illusions, hallucinations Neuro:  Symmetrical grip strength bilaterally CN II- XII grossly intact No facial droop

## 2013-08-08 NOTE — Assessment & Plan Note (Signed)
New and progressive. Difficult issue given complex medical history. Will request echo results from Dr. Humphrey Rolls. Start with blood work today to rule out some sources of global weakness. Orders Placed This Encounter  Procedures  . CBC with Differential  . Vitamin B12  . TSH  . T4, Free  . Comprehensive metabolic panel  . Vitamin D, 25-hydroxy   Consider head imaging if unremarkable. The patient and her daughter indicate understanding of these issues and agrees with the plan.

## 2013-08-08 NOTE — Patient Instructions (Signed)
Good to see you. We will call you with your lab results.   

## 2013-08-08 NOTE — Progress Notes (Signed)
Pre visit review using our clinic review tool, if applicable. No additional management support is needed unless otherwise documented below in the visit note. 

## 2013-08-09 LAB — CBC WITH DIFFERENTIAL/PLATELET
Basophils Absolute: 0 10*3/uL (ref 0.0–0.1)
Basophils Relative: 0.4 % (ref 0.0–3.0)
EOS PCT: 2.8 % (ref 0.0–5.0)
Eosinophils Absolute: 0.2 10*3/uL (ref 0.0–0.7)
HEMATOCRIT: 35.1 % — AB (ref 36.0–46.0)
HEMOGLOBIN: 11.5 g/dL — AB (ref 12.0–15.0)
LYMPHS PCT: 11.3 % — AB (ref 12.0–46.0)
Lymphs Abs: 0.8 10*3/uL (ref 0.7–4.0)
MCHC: 32.7 g/dL (ref 30.0–36.0)
MCV: 86.5 fl (ref 78.0–100.0)
MONOS PCT: 8.3 % (ref 3.0–12.0)
Monocytes Absolute: 0.6 10*3/uL (ref 0.1–1.0)
NEUTROS ABS: 5.7 10*3/uL (ref 1.4–7.7)
Neutrophils Relative %: 77.2 % — ABNORMAL HIGH (ref 43.0–77.0)
Platelets: 187 10*3/uL (ref 150.0–400.0)
RBC: 4.06 Mil/uL (ref 3.87–5.11)
RDW: 15.5 % (ref 11.5–15.5)
WBC: 7.4 10*3/uL (ref 4.0–10.5)

## 2013-08-09 LAB — COMPREHENSIVE METABOLIC PANEL
ALK PHOS: 108 U/L (ref 39–117)
ALT: 11 U/L (ref 0–35)
AST: 19 U/L (ref 0–37)
Albumin: 3.8 g/dL (ref 3.5–5.2)
BILIRUBIN TOTAL: 0.8 mg/dL (ref 0.2–1.2)
BUN: 18 mg/dL (ref 6–23)
CO2: 29 meq/L (ref 19–32)
Calcium: 9.1 mg/dL (ref 8.4–10.5)
Chloride: 104 mEq/L (ref 96–112)
Creatinine, Ser: 1.1 mg/dL (ref 0.4–1.2)
GFR: 53.51 mL/min — ABNORMAL LOW (ref >60.00)
Glucose, Bld: 77 mg/dL (ref 70–99)
POTASSIUM: 4.6 meq/L (ref 3.5–5.1)
SODIUM: 142 meq/L (ref 135–145)
TOTAL PROTEIN: 6.4 g/dL (ref 6.0–8.3)

## 2013-08-09 LAB — T4, FREE: Free T4: 0.78 ng/dL (ref 0.60–1.60)

## 2013-08-09 LAB — TSH: TSH: 2.27 u[IU]/mL (ref 0.35–4.50)

## 2013-08-09 LAB — VITAMIN D 25 HYDROXY (VIT D DEFICIENCY, FRACTURES): VITD: 17.41 ng/mL — ABNORMAL LOW (ref 30.00–100.00)

## 2013-08-09 LAB — VITAMIN B12: VITAMIN B 12: 352 pg/mL (ref 211–911)

## 2013-08-13 ENCOUNTER — Telehealth: Payer: Self-pay | Admitting: Family Medicine

## 2013-08-13 MED ORDER — VITAMIN D (ERGOCALCIFEROL) 1.25 MG (50000 UNIT) PO CAPS: 50000.0000 [IU] | ORAL_CAPSULE | ORAL | Status: DC

## 2013-08-13 NOTE — Telephone Encounter (Signed)
Joy [name] called regarding pt's B-12 and Vit D levels. After lab work came back, pt was told to take supplements. Should they get OTC or is it a rx? If OTC what does should it be. Please give [name] a call back.

## 2013-08-13 NOTE — Telephone Encounter (Signed)
Spoke to Joy [name] and advised her of Rx. Pt requesting that all results be relayed directly to her as her parents do not always remember.

## 2013-08-13 NOTE — Addendum Note (Signed)
Addended by: Modena Nunnery on: 08/13/2013 10:51 AM   Modules accepted: Orders

## 2013-08-14 ENCOUNTER — Encounter: Payer: Self-pay | Admitting: Internal Medicine

## 2013-08-14 DIAGNOSIS — Z5189 Encounter for other specified aftercare: Secondary | ICD-10-CM | POA: Diagnosis not present

## 2013-08-14 DIAGNOSIS — J449 Chronic obstructive pulmonary disease, unspecified: Secondary | ICD-10-CM | POA: Diagnosis not present

## 2013-08-21 ENCOUNTER — Telehealth: Payer: Self-pay | Admitting: Family Medicine

## 2013-08-21 ENCOUNTER — Other Ambulatory Visit: Payer: Self-pay | Admitting: Family Medicine

## 2013-08-21 NOTE — Telephone Encounter (Signed)
Attending 1 Statement from [facility] in you inbox for review and signature. Please return to [place] once completed.

## 2013-08-22 NOTE — Telephone Encounter (Signed)
Mailed ppw 08/22/13. Copy sent for scanning

## 2013-08-25 ENCOUNTER — Other Ambulatory Visit: Payer: Self-pay | Admitting: Family Medicine

## 2013-08-27 DIAGNOSIS — R262 Difficulty in walking, not elsewhere classified: Secondary | ICD-10-CM | POA: Diagnosis not present

## 2013-08-27 DIAGNOSIS — M6281 Muscle weakness (generalized): Secondary | ICD-10-CM | POA: Diagnosis not present

## 2013-08-29 DIAGNOSIS — M6281 Muscle weakness (generalized): Secondary | ICD-10-CM | POA: Diagnosis not present

## 2013-08-29 DIAGNOSIS — R262 Difficulty in walking, not elsewhere classified: Secondary | ICD-10-CM | POA: Diagnosis not present

## 2013-08-31 DIAGNOSIS — R262 Difficulty in walking, not elsewhere classified: Secondary | ICD-10-CM | POA: Diagnosis not present

## 2013-08-31 DIAGNOSIS — M6281 Muscle weakness (generalized): Secondary | ICD-10-CM | POA: Diagnosis not present

## 2013-09-03 DIAGNOSIS — R262 Difficulty in walking, not elsewhere classified: Secondary | ICD-10-CM | POA: Diagnosis not present

## 2013-09-03 DIAGNOSIS — M6281 Muscle weakness (generalized): Secondary | ICD-10-CM | POA: Diagnosis not present

## 2013-09-05 ENCOUNTER — Telehealth: Payer: Self-pay | Admitting: Family Medicine

## 2013-09-05 NOTE — Telephone Encounter (Signed)
Attending 81 Statement from [facility] in you inbox for review and signature. Please return to [place] once completed.

## 2013-09-05 NOTE — Telephone Encounter (Signed)
Form signed and in my box. 

## 2013-09-05 NOTE — Telephone Encounter (Signed)
Mailed 09/05/13. Copy sent for scanning and copy made for [facility] file.

## 2013-09-06 DIAGNOSIS — J449 Chronic obstructive pulmonary disease, unspecified: Secondary | ICD-10-CM | POA: Diagnosis not present

## 2013-09-06 DIAGNOSIS — M6281 Muscle weakness (generalized): Secondary | ICD-10-CM | POA: Diagnosis not present

## 2013-09-06 DIAGNOSIS — R262 Difficulty in walking, not elsewhere classified: Secondary | ICD-10-CM | POA: Diagnosis not present

## 2013-09-07 ENCOUNTER — Other Ambulatory Visit: Payer: Self-pay | Admitting: Family Medicine

## 2013-09-07 MED ORDER — SERTRALINE HCL 100 MG PO TABS: ORAL_TABLET | ORAL | Status: DC

## 2013-09-11 DIAGNOSIS — J449 Chronic obstructive pulmonary disease, unspecified: Secondary | ICD-10-CM | POA: Diagnosis not present

## 2013-09-11 DIAGNOSIS — M6281 Muscle weakness (generalized): Secondary | ICD-10-CM | POA: Diagnosis not present

## 2013-09-11 DIAGNOSIS — R262 Difficulty in walking, not elsewhere classified: Secondary | ICD-10-CM | POA: Diagnosis not present

## 2013-09-17 DIAGNOSIS — M6281 Muscle weakness (generalized): Secondary | ICD-10-CM | POA: Diagnosis not present

## 2013-09-17 DIAGNOSIS — J449 Chronic obstructive pulmonary disease, unspecified: Secondary | ICD-10-CM | POA: Diagnosis not present

## 2013-09-17 DIAGNOSIS — R262 Difficulty in walking, not elsewhere classified: Secondary | ICD-10-CM | POA: Diagnosis not present

## 2013-09-19 ENCOUNTER — Other Ambulatory Visit: Payer: Self-pay | Admitting: Family Medicine

## 2013-09-19 ENCOUNTER — Ambulatory Visit (INDEPENDENT_AMBULATORY_CARE_PROVIDER_SITE_OTHER): Payer: Medicare Other | Admitting: Family Medicine

## 2013-09-19 ENCOUNTER — Encounter: Payer: Self-pay | Admitting: Family Medicine

## 2013-09-19 VITALS — BP 144/82 | HR 78 | Temp 98.2°F | Wt 202.2 lb

## 2013-09-19 DIAGNOSIS — I2510 Atherosclerotic heart disease of native coronary artery without angina pectoris: Secondary | ICD-10-CM | POA: Diagnosis not present

## 2013-09-19 DIAGNOSIS — E7800 Pure hypercholesterolemia, unspecified: Secondary | ICD-10-CM

## 2013-09-19 DIAGNOSIS — R5383 Other fatigue: Secondary | ICD-10-CM

## 2013-09-19 DIAGNOSIS — R531 Weakness: Secondary | ICD-10-CM

## 2013-09-19 DIAGNOSIS — R5381 Other malaise: Secondary | ICD-10-CM | POA: Diagnosis not present

## 2013-09-19 DIAGNOSIS — R300 Dysuria: Secondary | ICD-10-CM

## 2013-09-19 MED ORDER — CIPROFLOXACIN HCL 250 MG PO TABS: 250.0000 mg | ORAL_TABLET | Freq: Two times a day (BID) | ORAL | Status: DC

## 2013-09-19 NOTE — Assessment & Plan Note (Signed)
New with dysuria. >25 minutes spent in face to face time with patient, >50% spent in counselling or coordination of care Unable to leave urine sample today- tried multiple times.  Given her symptoms, will treat prophylactically for UTI with 5 day course of cipro (for renal penetration). Lung exam reassuring today. Follow up with me in 2 days. The patient indicates understanding of these issues and agrees with the plan.

## 2013-09-19 NOTE — Progress Notes (Signed)
Pre visit review using our clinic review tool, if applicable. No additional management support is needed unless otherwise documented below in the visit note. 

## 2013-09-19 NOTE — Patient Instructions (Addendum)
It was great to see you. Take cipro as directed- 1 tablet twice daily for 5 days.  Drink more water.  Please come see me on Friday.

## 2013-09-19 NOTE — Progress Notes (Signed)
80 yo with h/o COPD, depression, MELAS and interstitial lung disease here with her daughter Joy [name] to discuss weakness and dysuria.  Cancelled PT for last few days because doesn't feel good.  Dysuria, suprapubic pressure.  +HA More SOB just within past couple of hours but she has not used her albuterol inhaler at all for past few days.  No fevers. No n/v/d.   She is on chronic O2.  She sometimes is non compliant with her O2 at night.   Lab Results  Component Value Date   WBC 7.4 08/08/2013   HGB 11.5* 08/08/2013   HCT 35.1* 08/08/2013   MCV 86.5 08/08/2013   PLT 187.0 08/08/2013   Lab Results  Component Value Date   TSH 2.27 08/08/2013    Patient Active Problem List   Diagnosis Date Noted  . CAD (coronary artery disease)   . HLD (hyperlipidemia)   . Lung nodule 01/15/2013  . Memory loss 01/15/2013  . Decreased appetite 01/15/2013  . Weakness generalized 12/26/2012  . Incontinence 12/26/2012  . Dysuria 12/01/2012  . Yeast vaginitis 12/01/2012  . Vulvar irritation 09/15/2012  . GERD (gastroesophageal reflux disease) 09/06/2012  . Neuropathy 01/14/2011  . Insomnia 01/14/2011  . 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 HTN (hypertension)   . COPD (chronic obstructive pulmonary disease)   . Depression   . Thyroid disease    Past Medical History  Diagnosis Date  . HTN (hypertension)   . COPD (chronic obstructive pulmonary disease)   . Depression   . Thyroid disease   . PMR (polymyalgia rheumatica)     with h/o steroid induced myopathy  . Anxiety   . Diastolic dysfunction     Echo 05/11/2010, EF 65-70%  . TIA (transient ischemic attack)     11/05 s/p L CEA  . Kidney infection   . Carotid artery occlusion   . CAD (coronary artery disease)     h/o NSTEMI, LAD stent placement 10/04, cath 05/11/2010  . HLD (hyperlipidemia)    Past Surgical History  Procedure Laterality Date  . Cholecystectomy    . Carotid endarterectomy      left with stent placement 11/2003  . Iliac artery stent      right, 2001  . Cardiac catheterization      [facility]  . Cardiac catheterization    . Cardiac catheterization    . Lung biopsy     History  Substance Use Topics  . Smoking status: Former Smoker -- 1.00 packs/day for 25 years    Types: Cigarettes    Quit date: 01/05/1996  . Smokeless tobacco: Never Used  . Alcohol Use: No   No family history on file. Allergies  Allergen Reactions  . Codeine Other (See Comments)    Coma for 2 days  . Prednisone Other (See Comments)    Weight gain,puffy face  . Shellfish Allergy    Current Outpatient Prescriptions on File Prior to Visit  Medication Sig Dispense Refill  . ADVAIR DISKUS 250-50 MCG/DOSE AEPB TAKE 1 PUFF TWICE A DAY  60 each  5  . albuterol (PROVENTIL HFA;VENTOLIN HFA) 108 (90 BASE) MCG/ACT inhaler Inhale 2 puffs into the lungs every 6 (six) hours as needed for wheezing.  1 Inhaler  0  . amLODipine (NORVASC) 5 MG tablet TAKE 1 TABLET BY MOUTH EVERY DAY  90 tablet  0  . aspirin EC 81 MG tablet Take 81 mg by mouth daily.        Marland Kitchen  atorvastatin (LIPITOR) 40 MG tablet TAKE 1 TABLET BY MOUTH EVERY DAY  90 tablet  0  . clopidogrel (PLAVIX) 75 MG tablet TAKE 1 TABLET BY MOUTH DAILY  30 tablet  0  . gabapentin (NEURONTIN) 800 MG tablet Take 800 mg by mouth 2 (two) times daily.      Marland Kitchen levothyroxine (SYNTHROID, LEVOTHROID) 25 MCG tablet TAKE 1 TABLET EVERY DAY  30 tablet  0  . lisinopril (PRINIVIL,ZESTRIL) 10 MG tablet TAKE 2 TABLETS (20 MG TOTAL) BY MOUTH DAILY.  60 tablet  5  . nitroGLYCERIN (NITROSTAT) 0.4 MG SL tablet Place 0.4 mg under the tongue every 5 (five) minutes as needed.        . ranitidine (ZANTAC) 300 MG tablet TAKE 1 TABLET (300 MG TOTAL) BY MOUTH AT BEDTIME.  90 tablet  1  . sertraline (ZOLOFT) 100 MG tablet TAKE 1 TABLET BY MOUTH EVERY DAY  30 tablet  0  . tiotropium (SPIRIVA HANDIHALER) 18 MCG inhalation capsule Place 1 capsule (18 mcg total) into inhaler and inhale  daily.  90 capsule  1  . traZODone (DESYREL) 50 MG tablet TAKE 1/2 TO 1 TABLET AT BEDTIME AS NEEDED FOR SLEEP  30 tablet  0  . Vitamin D, Ergocalciferol, (DRISDOL) 50000 UNITS CAPS capsule Take 1 capsule (50,000 Units total) by mouth every 7 (seven) days.  6 capsule  0   No current facility-administered medications on file prior to visit.   The PMH, PSH, Social History, Family History, Medications, and allergies have been reviewed in [facility], and have been updated if relevant.     ROS: See HPI No dysuria. No CP No SOB No changes in bowel habits She is not sure if she had blood in her stool No facial droop No left or right sided UE weakness No radiculopathy No swelling of knees  Physical exam: BP 144/82  Pulse 78  Temp(Src) 98.2 F (36.8 C) (Oral)  Wt 202 lb 4 oz (91.74 kg)  SpO2 94%   General:  Well-developed,well-nourished,in no acute distress; alert,appropriate and cooperative throughout examination Does appear more pale today Head:  normocephalic and atraumatic.   Eyes:  vision grossly intact, pupils equal, pupils round, and pupils reactive to light.   Ears:  R ear normal and L ear normal.   Nose:  no external deformity, pos O2 per [place] Mouth:  good dentition Lungs:  Normal respiratory effort, chest expands symmetrically.  Heart:  Normal rate and regular rhythm. S1 and S2 normal without gallop, murmur, click, rub or other extra sounds. No LE edema Skin:  Intact without suspicious lesions or rashes Abd:  Soft, mild suprapubic tenderness No CVA tenderness Psych:  Cognition and judgment appear intact. Alert and cooperative with normal attention span and concentration. No apparent delusions, illusions, hallucinations Neuro:  Symmetrical grip strength bilaterally CN II- XII grossly intact

## 2013-09-20 DIAGNOSIS — J449 Chronic obstructive pulmonary disease, unspecified: Secondary | ICD-10-CM | POA: Diagnosis not present

## 2013-09-20 DIAGNOSIS — M6281 Muscle weakness (generalized): Secondary | ICD-10-CM | POA: Diagnosis not present

## 2013-09-20 DIAGNOSIS — R262 Difficulty in walking, not elsewhere classified: Secondary | ICD-10-CM | POA: Diagnosis not present

## 2013-09-21 ENCOUNTER — Ambulatory Visit: Payer: Self-pay | Admitting: Family Medicine

## 2013-09-24 ENCOUNTER — Telehealth: Payer: Self-pay | Admitting: Family Medicine

## 2013-09-24 NOTE — Telephone Encounter (Signed)
Patient did not come for their scheduled appointment 9/18/12for 2 day follow-up per Dr Deborra Medina, had to use same day slot .  Please let me know if the patient needs to be contacted immediately for follow up or if no follow up is necessary.

## 2013-09-24 NOTE — Telephone Encounter (Signed)
No follow up necessary.  Please do not charge her now show fee.

## 2013-09-26 ENCOUNTER — Institutional Professional Consult (permissible substitution): Payer: Self-pay | Admitting: Family Medicine

## 2013-09-26 DIAGNOSIS — M6281 Muscle weakness (generalized): Secondary | ICD-10-CM | POA: Diagnosis not present

## 2013-09-26 DIAGNOSIS — J449 Chronic obstructive pulmonary disease, unspecified: Secondary | ICD-10-CM | POA: Diagnosis not present

## 2013-09-26 DIAGNOSIS — R262 Difficulty in walking, not elsewhere classified: Secondary | ICD-10-CM | POA: Diagnosis not present

## 2013-09-27 ENCOUNTER — Ambulatory Visit (INDEPENDENT_AMBULATORY_CARE_PROVIDER_SITE_OTHER): Payer: Medicare Other | Admitting: Cardiovascular Disease

## 2013-09-27 ENCOUNTER — Encounter: Payer: Self-pay | Admitting: Cardiovascular Disease

## 2013-09-27 VITALS — BP 126/50 | HR 74 | Ht 64.0 in | Wt 202.5 lb

## 2013-09-27 DIAGNOSIS — I10 Essential (primary) hypertension: Secondary | ICD-10-CM | POA: Diagnosis not present

## 2013-09-27 DIAGNOSIS — E785 Hyperlipidemia, unspecified: Secondary | ICD-10-CM | POA: Diagnosis not present

## 2013-09-27 DIAGNOSIS — I2510 Atherosclerotic heart disease of native coronary artery without angina pectoris: Secondary | ICD-10-CM | POA: Diagnosis not present

## 2013-09-27 DIAGNOSIS — R079 Chest pain, unspecified: Secondary | ICD-10-CM | POA: Diagnosis not present

## 2013-09-27 NOTE — Assessment & Plan Note (Signed)
She is doing very well with no symptoms suggestive of angina. Continue medical therapy.

## 2013-09-27 NOTE — Patient Instructions (Signed)
Continue same medications.   Your physician wants you to follow-up in: 6 months.  You will receive a reminder letter in the mail two months in advance. If you don't receive a letter, please call our office to schedule the follow-up appointment.  

## 2013-09-27 NOTE — Assessment & Plan Note (Signed)
Lab Results  Component Value Date   CHOL 287* 01/06/2010   HDL 39 01/06/2010   LDLCALC 190 01/06/2010   TRIG 289* 01/06/2010   Continue treatment with atorvastatin. Lipid profile was requested by Dr.Aron. She is not fasting today to have labs done.

## 2013-09-27 NOTE — Assessment & Plan Note (Signed)
Blood pressure is well controlled on current medications. 

## 2013-09-27 NOTE — Progress Notes (Signed)
Primary care physician: Dr. Deborra Medina  HPI  This is a pleasant 80 year old female who is here today for followup visit regarding coronary artery disease. She has known history of coronary artery disease with previous LAD stent in 2004 at [facility], vascular dementia, CVA, carotid artery stenting, peripheral arterial disease, hypertension and hyperlipidemia. She was hospitalized briefly at [facility] early this year with atypical chest pain. She lives at [facility] [place] with her husband. She had a previous echocardiogram in 2011 which showed normal LV systolic function.  She underwent an outpatient nuclear stress test which was normal. Metoprolol was discontinued due to bradycardia. She has been doing very well and denies any chest pain, shortness of breath or palpitations.  Allergies  Allergen Reactions  . Codeine Other (See Comments)    Coma for 2 days  . Prednisone Other (See Comments)    Weight gain,puffy face  . Shellfish Allergy      Current Outpatient Prescriptions on File Prior to Visit  Medication Sig Dispense Refill  . ADVAIR DISKUS 250-50 MCG/DOSE AEPB TAKE 1 PUFF TWICE A DAY  60 each  5  . albuterol (PROVENTIL HFA;VENTOLIN HFA) 108 (90 BASE) MCG/ACT inhaler Inhale 2 puffs into the lungs every 6 (six) hours as needed for wheezing.  1 Inhaler  0  . amLODipine (NORVASC) 5 MG tablet TAKE 1 TABLET BY MOUTH EVERY DAY  90 tablet  0  . aspirin EC 81 MG tablet Take 81 mg by mouth daily.        Marland Kitchen atorvastatin (LIPITOR) 40 MG tablet TAKE 1 TABLET BY MOUTH EVERY DAY  90 tablet  0  . ciprofloxacin (CIPRO) 250 MG tablet Take 1 tablet (250 mg total) by mouth 2 (two) times daily.  10 tablet  0  . clopidogrel (PLAVIX) 75 MG tablet TAKE 1 TABLET BY MOUTH DAILY  30 tablet  0  . gabapentin (NEURONTIN) 800 MG tablet Take 800 mg by mouth 2 (two) times daily.      Marland Kitchen levothyroxine (SYNTHROID, LEVOTHROID) 25 MCG tablet TAKE 1 TABLET EVERY DAY  30 tablet  0  . lisinopril (PRINIVIL,ZESTRIL) 10 MG tablet TAKE 2 TABLETS (20  MG TOTAL) BY MOUTH DAILY.  60 tablet  5  . nitroGLYCERIN (NITROSTAT) 0.4 MG SL tablet Place 0.4 mg under the tongue every 5 (five) minutes as needed.        . ranitidine (ZANTAC) 300 MG tablet TAKE 1 TABLET (300 MG TOTAL) BY MOUTH AT BEDTIME.  90 tablet  1  . sertraline (ZOLOFT) 100 MG tablet TAKE 1 TABLET BY MOUTH EVERY DAY  30 tablet  0  . tiotropium (SPIRIVA HANDIHALER) 18 MCG inhalation capsule Place 1 capsule (18 mcg total) into inhaler and inhale daily.  90 capsule  1  . traZODone (DESYREL) 50 MG tablet TAKE 1/2 TO 1 TABLET AT BEDTIME AS NEEDED FOR SLEEP  30 tablet  0  . Vitamin D, Ergocalciferol, (DRISDOL) 50000 UNITS CAPS capsule Take 1 capsule (50,000 Units total) by mouth every 7 (seven) days.  6 capsule  0   No current facility-administered medications on file prior to visit.     Past Medical History  Diagnosis Date  . HTN (hypertension)   . COPD (chronic obstructive pulmonary disease)   . Depression   . Thyroid disease   . PMR (polymyalgia rheumatica)     with h/o steroid induced myopathy  . Anxiety   . Diastolic dysfunction     Echo 05/11/2010, EF 65-70%  . TIA (transient ischemic attack)  11/05 s/p L CEA  . Kidney infection   . Carotid artery occlusion   . CAD (coronary artery disease)     h/o NSTEMI, LAD stent placement 10/04, cath 05/11/2010  . HLD (hyperlipidemia)      Past Surgical History  Procedure Laterality Date  . Cholecystectomy    . Carotid endarterectomy      left with stent placement 11/2003  . Iliac artery stent      right, 2001  . Cardiac catheterization      unc  . Cardiac catheterization    . Cardiac catheterization    . Lung biopsy       Family History  Problem Relation Age of Onset  . Family history unknown: Yes     History   Social History  . Marital Status: Married    Spouse Name: N/A    Number of Children: N/A  . Years of Education: N/A   Occupational History  . retired     Pharmacist, hospital   Social History Main Topics  .  Smoking status: Former Smoker -- 1.00 packs/day for 25 years    Types: Cigarettes    Quit date: 01/05/1996  . Smokeless tobacco: Never Used  . Alcohol Use: No  . Drug Use: No  . Sexual Activity: Not on file   Other Topics Concern  . Not on file   Social History Narrative   Lives with husband, [name] at [place].   Daughters, [name] and [name] very involved.     PHYSICAL EXAM   BP 126/50  Pulse 74  Ht 5\' 4"  (1.626 m)  Wt 202 lb 8 oz (91.853 kg)  BMI 34.74 kg/m2 Constitutional: She is oriented to person, place, and time. She appears well-developed and well-nourished. No distress.  HENT: No nasal discharge.  Head: Normocephalic and atraumatic.  Eyes: Pupils are equal and round. No discharge.  Neck: Normal range of motion. Neck supple. No JVD present. No thyromegaly present.  Cardiovascular: Normal rate, regular rhythm, normal heart sounds. Exam reveals no gallop and no friction rub. No murmur heard.  Pulmonary/Chest: Effort normal and breath sounds normal. No stridor. No respiratory distress. She has no wheezes. She has no rales. She exhibits no tenderness.  Abdominal: Soft. Bowel sounds are normal. She exhibits no distension. There is no tenderness. There is no rebound and no guarding.  Musculoskeletal: Normal range of motion. She exhibits no edema and no tenderness.  Neurological: She is alert and oriented to person, place, and time. Coordination normal.  Skin: Skin is warm and dry. No rash noted. She is not diaphoretic. No erythema. No pallor.  Psychiatric: She has a normal mood and affect. Her behavior is normal. Judgment and thought content normal.     EKG: Sinus  Rhythm  Low voltage in precordial leads.   ABNORMAL     ASSESSMENT AND PLAN

## 2013-10-02 ENCOUNTER — Other Ambulatory Visit: Payer: Self-pay | Admitting: *Deleted

## 2013-10-02 DIAGNOSIS — R262 Difficulty in walking, not elsewhere classified: Secondary | ICD-10-CM | POA: Diagnosis not present

## 2013-10-02 DIAGNOSIS — J449 Chronic obstructive pulmonary disease, unspecified: Secondary | ICD-10-CM | POA: Diagnosis not present

## 2013-10-02 DIAGNOSIS — M6281 Muscle weakness (generalized): Secondary | ICD-10-CM | POA: Diagnosis not present

## 2013-10-02 MED ORDER — TRAZODONE HCL 50 MG PO TABS: ORAL_TABLET | ORAL | Status: DC

## 2013-10-02 MED ORDER — CLOPIDOGREL BISULFATE 75 MG PO TABS: ORAL_TABLET | ORAL | Status: DC

## 2013-10-02 NOTE — Addendum Note (Signed)
Addended by: Modena Nunnery on: 10/02/2013 04:05 PM   Modules accepted: Orders

## 2013-10-03 ENCOUNTER — Other Ambulatory Visit: Payer: Self-pay | Admitting: Family Medicine

## 2013-10-03 DIAGNOSIS — E550 Rickets, active: Secondary | ICD-10-CM

## 2013-10-04 DIAGNOSIS — J449 Chronic obstructive pulmonary disease, unspecified: Secondary | ICD-10-CM | POA: Diagnosis not present

## 2013-10-04 DIAGNOSIS — R262 Difficulty in walking, not elsewhere classified: Secondary | ICD-10-CM | POA: Diagnosis not present

## 2013-10-04 DIAGNOSIS — M6281 Muscle weakness (generalized): Secondary | ICD-10-CM | POA: Diagnosis not present

## 2013-10-05 DIAGNOSIS — M6281 Muscle weakness (generalized): Secondary | ICD-10-CM | POA: Diagnosis not present

## 2013-10-05 DIAGNOSIS — J449 Chronic obstructive pulmonary disease, unspecified: Secondary | ICD-10-CM | POA: Diagnosis not present

## 2013-10-05 DIAGNOSIS — R262 Difficulty in walking, not elsewhere classified: Secondary | ICD-10-CM | POA: Diagnosis not present

## 2013-10-08 ENCOUNTER — Other Ambulatory Visit: Payer: Self-pay

## 2013-10-09 DIAGNOSIS — J449 Chronic obstructive pulmonary disease, unspecified: Secondary | ICD-10-CM | POA: Diagnosis not present

## 2013-10-09 DIAGNOSIS — M6281 Muscle weakness (generalized): Secondary | ICD-10-CM | POA: Diagnosis not present

## 2013-10-09 DIAGNOSIS — R262 Difficulty in walking, not elsewhere classified: Secondary | ICD-10-CM | POA: Diagnosis not present

## 2013-10-12 ENCOUNTER — Other Ambulatory Visit (INDEPENDENT_AMBULATORY_CARE_PROVIDER_SITE_OTHER): Payer: Medicare Other

## 2013-10-12 DIAGNOSIS — E7800 Pure hypercholesterolemia, unspecified: Secondary | ICD-10-CM

## 2013-10-12 DIAGNOSIS — R262 Difficulty in walking, not elsewhere classified: Secondary | ICD-10-CM | POA: Diagnosis not present

## 2013-10-12 DIAGNOSIS — E550 Rickets, active: Secondary | ICD-10-CM

## 2013-10-12 DIAGNOSIS — J449 Chronic obstructive pulmonary disease, unspecified: Secondary | ICD-10-CM | POA: Diagnosis not present

## 2013-10-12 DIAGNOSIS — M6281 Muscle weakness (generalized): Secondary | ICD-10-CM | POA: Diagnosis not present

## 2013-10-12 LAB — LIPID PANEL
CHOL/HDL RATIO: 5
CHOLESTEROL: 153 mg/dL (ref 0–200)
HDL: 31.6 mg/dL — ABNORMAL LOW (ref >39.00)
NonHDL: 121.4
TRIGLYCERIDES: 208 mg/dL — AB (ref 0.0–149.0)
VLDL: 41.6 mg/dL — ABNORMAL HIGH (ref 0.0–40.0)

## 2013-10-12 LAB — VITAMIN D 25 HYDROXY (VIT D DEFICIENCY, FRACTURES): VITD: 32.99 ng/mL (ref 30.00–100.00)

## 2013-10-12 LAB — LDL CHOLESTEROL, DIRECT: LDL DIRECT: 87.5 mg/dL

## 2013-10-15 ENCOUNTER — Encounter: Payer: Self-pay | Admitting: *Deleted

## 2013-10-15 DIAGNOSIS — R262 Difficulty in walking, not elsewhere classified: Secondary | ICD-10-CM | POA: Diagnosis not present

## 2013-10-15 DIAGNOSIS — J449 Chronic obstructive pulmonary disease, unspecified: Secondary | ICD-10-CM | POA: Diagnosis not present

## 2013-10-15 DIAGNOSIS — M6281 Muscle weakness (generalized): Secondary | ICD-10-CM | POA: Diagnosis not present

## 2013-10-16 ENCOUNTER — Other Ambulatory Visit: Payer: Self-pay | Admitting: *Deleted

## 2013-10-16 MED ORDER — LEVOTHYROXINE SODIUM 25 MCG PO TABS: ORAL_TABLET | ORAL | Status: DC

## 2013-10-16 MED ORDER — SERTRALINE HCL 100 MG PO TABS: ORAL_TABLET | ORAL | Status: DC

## 2013-10-18 NOTE — Telephone Encounter (Signed)
[facility] left v/m requesting status of sertraline and levothyroxine; cb [facility]ills; nothing further needed.

## 2013-10-19 ENCOUNTER — Other Ambulatory Visit: Payer: Self-pay | Admitting: *Deleted

## 2013-10-19 DIAGNOSIS — E550 Rickets, active: Secondary | ICD-10-CM

## 2013-10-19 MED ORDER — VITAMIN D (ERGOCALCIFEROL) 1.25 MG (50000 UNIT) PO CAPS: 50000.0000 [IU] | ORAL_CAPSULE | ORAL | Status: DC

## 2013-10-19 NOTE — Telephone Encounter (Signed)
Pt requesting medication refill. She recently had labs and VitD was slightly above normal. Ok to refill? pls advise

## 2013-10-23 ENCOUNTER — Ambulatory Visit: Payer: Self-pay | Admitting: Family Medicine

## 2013-10-25 DIAGNOSIS — Z23 Encounter for immunization: Secondary | ICD-10-CM | POA: Diagnosis not present

## 2013-10-30 ENCOUNTER — Encounter: Payer: Self-pay | Admitting: Family Medicine

## 2013-10-30 ENCOUNTER — Ambulatory Visit (INDEPENDENT_AMBULATORY_CARE_PROVIDER_SITE_OTHER): Payer: Medicare Other | Admitting: Family Medicine

## 2013-10-30 VITALS — BP 126/62 | HR 78 | Temp 98.1°F | Wt 202.8 lb

## 2013-10-30 DIAGNOSIS — I2510 Atherosclerotic heart disease of native coronary artery without angina pectoris: Secondary | ICD-10-CM

## 2013-10-30 DIAGNOSIS — D2239 Melanocytic nevi of other parts of face: Secondary | ICD-10-CM

## 2013-10-30 NOTE — Assessment & Plan Note (Signed)
New- will refer to dermatology since it did change. Could simply be a benign nevus that became irritated by the nasal canula tubing. Cannot rule out SCC. Referral placed. The patient indicates understanding of these issues and agrees with the plan.

## 2013-10-30 NOTE — Progress Notes (Signed)
Pre visit review using our clinic review tool, if applicable. No additional management support is needed unless otherwise documented below in the visit note. 

## 2013-10-30 NOTE — Progress Notes (Signed)
Subjective:    Patient ID: Joy [name], female    DOB: 06/06/1930, 80 y.o.   MRN: [id number]  HPI  Here with her daughter for ?abnormal mole on her right cheek.  Last week was flesh colored, then scabbed up and part of it fell off.  Still brownish/red appearing. It is near where her O2 nasal canala tubing comes across her face.  Current Outpatient Prescriptions on File Prior to Visit  Medication Sig Dispense Refill  . ADVAIR DISKUS 250-50 MCG/DOSE AEPB TAKE 1 PUFF TWICE A DAY  60 each  5  . albuterol (PROVENTIL HFA;VENTOLIN HFA) 108 (90 BASE) MCG/ACT inhaler Inhale 2 puffs into the lungs every 6 (six) hours as needed for wheezing.  1 Inhaler  0  . amLODipine (NORVASC) 5 MG tablet TAKE 1 TABLET BY MOUTH EVERY DAY  90 tablet  0  . aspirin EC 81 MG tablet Take 81 mg by mouth daily.        Marland Kitchen atorvastatin (LIPITOR) 40 MG tablet TAKE 1 TABLET BY MOUTH EVERY DAY  90 tablet  0  . clopidogrel (PLAVIX) 75 MG tablet TAKE 1 TABLET BY MOUTH DAILY  30 tablet  5  . gabapentin (NEURONTIN) 800 MG tablet Take 800 mg by mouth 2 (two) times daily.      Marland Kitchen levothyroxine (SYNTHROID, LEVOTHROID) 25 MCG tablet TAKE 1 TABLET EVERY DAY  30 tablet  5  . lisinopril (PRINIVIL,ZESTRIL) 10 MG tablet TAKE 2 TABLETS (20 MG TOTAL) BY MOUTH DAILY.  60 tablet  5  . nitroGLYCERIN (NITROSTAT) 0.4 MG SL tablet Place 0.4 mg under the tongue every 5 (five) minutes as needed.        . ranitidine (ZANTAC) 300 MG tablet TAKE 1 TABLET (300 MG TOTAL) BY MOUTH AT BEDTIME.  90 tablet  1  . sertraline (ZOLOFT) 100 MG tablet TAKE 1 TABLET BY MOUTH EVERY DAY  30 tablet  0  . tiotropium (SPIRIVA HANDIHALER) 18 MCG inhalation capsule Place 1 capsule (18 mcg total) into inhaler and inhale daily.  90 capsule  1  . traZODone (DESYREL) 50 MG tablet TAKE 1/2 TO 1 TABLET AT BEDTIME AS NEEDED FOR SLEEP  30 tablet  2  . Vitamin D, Ergocalciferol, (DRISDOL) 50000 UNITS CAPS capsule Take 1 capsule (50,000 Units total) by mouth every 7 (seven)  days.  6 capsule  0   No current facility-administered medications on file prior to visit.    Allergies  Allergen Reactions  . Codeine Other (See Comments)    Coma for 2 days  . Prednisone Other (See Comments)    Weight gain,puffy face  . Shellfish Allergy     Past Medical History  Diagnosis Date  . HTN (hypertension)   . COPD (chronic obstructive pulmonary disease)   . Depression   . Thyroid disease   . PMR (polymyalgia rheumatica)     with h/o steroid induced myopathy  . Anxiety   . Diastolic dysfunction     Echo 05/11/2010, EF 65-70%  . TIA (transient ischemic attack)     11/05 s/p L CEA  . Kidney infection   . Carotid artery occlusion   . CAD (coronary artery disease)     h/o NSTEMI, LAD stent placement 10/04, cath 05/11/2010  . HLD (hyperlipidemia)     Past Surgical History  Procedure Laterality Date  . Cholecystectomy    . Carotid endarterectomy      left with stent placement 11/2003  . Iliac artery stent  right, 2001  . Cardiac catheterization      unc  . Cardiac catheterization    . Cardiac catheterization    . Lung biopsy      No family history on file.  History   Social History  . Marital Status: Married    Spouse Name: N/A    Number of Children: N/A  . Years of Education: N/A   Occupational History  . retired     Pharmacist, hospital   Social History Main Topics  . Smoking status: Former Smoker -- 1.00 packs/day for 25 years    Types: Cigarettes    Quit date: 01/05/1996  . Smokeless tobacco: Never Used  . Alcohol Use: No  . Drug Use: No  . Sexual Activity: Not on file   Other Topics Concern  . Not on file   Social History Narrative   Lives with husband, [name] at [place].   Daughters, [name] and [name] very involved.   The PMH, PSH, Social History, Family History, Medications, and allergies have been reviewed in [facility], and have been updated if relevant.   Review of Systems  Constitutional: Negative for fever.  Hematological: Negative.   Negative for adenopathy. Does not bruise/bleed easily.  All other systems reviewed and are negative.      Objective:   Physical Exam  Constitutional: She appears well-developed and well-nourished. No distress.  Skin: Skin is warm and dry.     Psychiatric: She has a normal mood and affect. Her behavior is normal. Judgment and thought content normal.   BP 126/62  Pulse 78  Temp(Src) 98.1 F (36.7 C) (Oral)  Wt 202 lb 12 oz (91.967 kg)  SpO2 89%        Assessment & Plan:

## 2013-11-05 DIAGNOSIS — J431 Panlobular emphysema: Secondary | ICD-10-CM | POA: Diagnosis not present

## 2013-11-05 DIAGNOSIS — J9611 Chronic respiratory failure with hypoxia: Secondary | ICD-10-CM | POA: Diagnosis not present

## 2013-11-05 DIAGNOSIS — J449 Chronic obstructive pulmonary disease, unspecified: Secondary | ICD-10-CM | POA: Diagnosis not present

## 2013-11-05 DIAGNOSIS — B3783 Candidal cheilitis: Secondary | ICD-10-CM | POA: Diagnosis not present

## 2013-11-05 DIAGNOSIS — J8489 Other specified interstitial pulmonary diseases: Secondary | ICD-10-CM | POA: Diagnosis not present

## 2013-11-07 ENCOUNTER — Ambulatory Visit: Payer: Self-pay | Admitting: Internal Medicine

## 2013-11-07 DIAGNOSIS — R0902 Hypoxemia: Secondary | ICD-10-CM | POA: Diagnosis not present

## 2013-11-12 ENCOUNTER — Other Ambulatory Visit: Payer: Self-pay | Admitting: *Deleted

## 2013-11-12 MED ORDER — TIOTROPIUM BROMIDE MONOHYDRATE 18 MCG IN CAPS: 18.0000 ug | ORAL_CAPSULE | Freq: Every day | RESPIRATORY_TRACT | Status: DC

## 2013-11-14 ENCOUNTER — Other Ambulatory Visit: Payer: Self-pay | Admitting: *Deleted

## 2013-11-14 MED ORDER — SERTRALINE HCL 100 MG PO TABS: ORAL_TABLET | ORAL | Status: DC

## 2013-11-27 DIAGNOSIS — J449 Chronic obstructive pulmonary disease, unspecified: Secondary | ICD-10-CM | POA: Diagnosis not present

## 2013-11-27 DIAGNOSIS — J431 Panlobular emphysema: Secondary | ICD-10-CM | POA: Diagnosis not present

## 2013-11-27 DIAGNOSIS — R0602 Shortness of breath: Secondary | ICD-10-CM | POA: Diagnosis not present

## 2013-11-27 DIAGNOSIS — J9622 Acute and chronic respiratory failure with hypercapnia: Secondary | ICD-10-CM | POA: Diagnosis not present

## 2013-11-28 ENCOUNTER — Other Ambulatory Visit: Payer: Self-pay | Admitting: *Deleted

## 2013-11-28 MED ORDER — AMLODIPINE BESYLATE 5 MG PO TABS: 5.0000 mg | ORAL_TABLET | Freq: Every day | ORAL | Status: DC

## 2013-12-17 ENCOUNTER — Other Ambulatory Visit: Payer: Self-pay | Admitting: *Deleted

## 2013-12-17 MED ORDER — ATORVASTATIN CALCIUM 40 MG PO TABS: 40.0000 mg | ORAL_TABLET | Freq: Every day | ORAL | Status: DC

## 2013-12-19 ENCOUNTER — Other Ambulatory Visit: Payer: Self-pay | Admitting: *Deleted

## 2013-12-19 DIAGNOSIS — E550 Rickets, active: Secondary | ICD-10-CM

## 2013-12-19 MED ORDER — VITAMIN D (ERGOCALCIFEROL) 1.25 MG (50000 UNIT) PO CAPS: 50000.0000 [IU] | ORAL_CAPSULE | ORAL | Status: DC

## 2013-12-27 ENCOUNTER — Other Ambulatory Visit: Payer: Self-pay | Admitting: *Deleted

## 2013-12-27 MED ORDER — GABAPENTIN 800 MG PO TABS
800.0000 mg | ORAL_TABLET | Freq: Two times a day (BID) | ORAL | Status: DC
Start: 2013-12-27 — End: 2014-06-18

## 2013-12-27 NOTE — Telephone Encounter (Signed)
Fax refill request, in chart it says pt taking BID, but Rx request is saying pt takes Rx TID, please advise

## 2014-01-02 ENCOUNTER — Other Ambulatory Visit: Payer: Self-pay | Admitting: *Deleted

## 2014-01-02 MED ORDER — RANITIDINE HCL 300 MG PO TABS: ORAL_TABLET | ORAL | Status: DC

## 2014-01-13 ENCOUNTER — Other Ambulatory Visit: Payer: Self-pay | Admitting: Family Medicine

## 2014-01-14 ENCOUNTER — Other Ambulatory Visit: Payer: Self-pay | Admitting: Family Medicine

## 2014-01-14 MED ORDER — TRAZODONE HCL 50 MG PO TABS: ORAL_TABLET | ORAL | Status: DC

## 2014-01-22 ENCOUNTER — Telehealth: Payer: Self-pay

## 2014-01-22 NOTE — Telephone Encounter (Signed)
Noted! Thank you

## 2014-01-22 NOTE — Telephone Encounter (Signed)
Joy [name] pt daughter left v/m; pt fell today; pt was bending over to water her cat; and did have slight cut on arm but no other apparent injury; pts knees just gave out; pt did not hit her head; nurse from [facility] checked pt out. Joy [name] just wanted Dr Deborra Medina to be aware what is going on with pt.

## 2014-02-01 ENCOUNTER — Telehealth: Payer: Self-pay

## 2014-02-01 ENCOUNTER — Ambulatory Visit (INDEPENDENT_AMBULATORY_CARE_PROVIDER_SITE_OTHER): Payer: Medicare Other | Admitting: Internal Medicine

## 2014-02-01 ENCOUNTER — Encounter: Payer: Self-pay | Admitting: Internal Medicine

## 2014-02-01 VITALS — BP 146/64 | HR 79 | Temp 98.4°F | Wt 199.5 lb

## 2014-02-01 DIAGNOSIS — R300 Dysuria: Secondary | ICD-10-CM

## 2014-02-01 LAB — POCT URINALYSIS DIPSTICK
Bilirubin, UA: NEGATIVE
Glucose, UA: NEGATIVE
Ketones, UA: NEGATIVE
LEUKOCYTES UA: NEGATIVE
NITRITE UA: NEGATIVE
PH UA: 7
PROTEIN UA: NEGATIVE
RBC UA: NEGATIVE
SPEC GRAV UA: 1.02
Urobilinogen, UA: NEGATIVE

## 2014-02-01 MED ORDER — PHENAZOPYRIDINE HCL 100 MG PO TABS
100.0000 mg | ORAL_TABLET | Freq: Three times a day (TID) | ORAL | Status: DC | PRN
Start: 2014-02-01 — End: 2014-04-08

## 2014-02-01 NOTE — Telephone Encounter (Signed)
PLEASE NOTE: All timestamps contained within this report are represented as [place] Standard Time. CONFIDENTIALTY NOTICE: This fax transmission is intended only for the addressee. It contains information that is legally privileged, confidential or otherwise protected from use or disclosure. If you are not the intended recipient, you are strictly prohibited from reviewing, disclosing, copying using or disseminating any of this information or taking any action in reliance on or regarding this information. If you have received this fax in error, please notify us immediately by telephone so that we can arrange for its return to [place]. Phone: [phone], Toll-Free: [phone], Fax: [phone] Page: 1 of 1 Call Id: [id number] [place] Patient Name: Joy [name] Gender: Female DOB: (approximate) Age: Return Phone Number: Address: City/State/Zip: [place] Client [place] [facility] - Client Client Site [place] Type Call Grace Name NA Caller Phone Number NA Relationship To Patient Daughter Is this call to report lab results? No Call Type General Information Initial Comment Caller states her mother thinks she has a UTI and she wants to know if they can pick up a cup to have her give a urine sample. General Information Type Message Only Nurse Assessment Guidelines Guideline Title Affirmed Question Affirmed Notes Nurse Date/Time (Eastern Time) Disp. Time Eilene Ghazi Time) Disposition Final User 02/01/2014 8:17:56 AM General Information Provided Yes Gae Gallop After Care Instructions Given Call Event Type User Date / Time Description

## 2014-02-01 NOTE — Progress Notes (Signed)
HPI  Joy [name], 81 y.o. female, presents to the clinic today with c/o urinary burning and nausea x 1 week. "Stings when I pee." Denies pain or pressure in back or pelvis, hematuria, fever or chills. Hx of UTIs.   Review of Systems  Past Medical History  Diagnosis Date  . HTN (hypertension)   . COPD (chronic obstructive pulmonary disease)   . Depression   . Thyroid disease   . PMR (polymyalgia rheumatica)     with h/o steroid induced myopathy  . Anxiety   . Diastolic dysfunction     Echo 05/11/2010, EF 65-70%  . TIA (transient ischemic attack)     11/05 s/p L CEA  . Kidney infection   . Carotid artery occlusion   . CAD (coronary artery disease)     h/o NSTEMI, LAD stent placement 10/04, cath 05/11/2010  . HLD (hyperlipidemia)     No family history on file.  History   Social History  . Marital Status: Married    Spouse Name: N/A    Number of Children: N/A  . Years of Education: N/A   Occupational History  . retired     Pharmacist, hospital   Social History Main Topics  . Smoking status: Former Smoker -- 1.00 packs/day for 25 years    Types: Cigarettes    Quit date: 01/05/1996  . Smokeless tobacco: Never Used  . Alcohol Use: No  . Drug Use: No  . Sexual Activity: Not on file   Other Topics Concern  . Not on file   Social History Narrative   Lives with husband, [name] at [place].   Daughters, [name] and [name] very involved.    Allergies  Allergen Reactions  . Codeine Other (See Comments)    Coma for 2 days  . Prednisone Other (See Comments)    Weight gain,puffy face  . Shellfish Allergy     Constitutional: Positive headache. Denies fever, malaise, fatigue or abrupt weight changes.   GU: Pt reports burning sensation w/ urination and urgency. Denies frequency, blood in urine, odor or discharge. Skin: Denies redness, rashes, lesions or ulcercations.   No other specific complaints in a complete review of systems (except as listed in HPI above).    Objective:   Physical Exam  BP 146/64 mmHg  Pulse 79  Temp(Src) 98.4 F (36.9 C) (Oral)  Wt 199 lb 8 oz (90.493 kg)  SpO2 96% Wt Readings from Last 3 Encounters:  02/01/14 199 lb 8 oz (90.493 kg)  10/30/13 202 lb 12 oz (91.967 kg)  09/27/13 202 lb 8 oz (91.853 kg)    General: Pt. is a pleasant 81 y.o. carrying/using supp O2,  in NAD. Cardiovascular: Normal rate and rhythm. S1,S2 noted.  No murmur, rubs or gallops noted.  Pulmonary/Chest: Effort normal and breath sounds normal. No stridor.  No respiratory distress. No wheezes, rales or ronchi noted.  Abdomen: Soft and nontender. Normal bowel sounds, no bruits noted. No distention or masses noted. Liver, spleen and kidneys non palpable. No tenderness to palpation over the bladder area. No CVA tenderness.      Assessment & Plan:   Dysuria  Urinalysis: Negative eRx for pyridium OTC Drink plenty of fluids.  RTC as needed or if symptoms persist.

## 2014-02-01 NOTE — Progress Notes (Signed)
Pre visit review using our clinic review tool, if applicable. No additional management support is needed unless otherwise documented below in the visit note. 

## 2014-02-01 NOTE — Patient Instructions (Addendum)
Urinary Tract Infection A urinary tract infection (UTI) can occur any place along the urinary tract. The tract includes the kidneys, ureters, bladder, and urethra. A type of germ called bacteria often causes a UTI. UTIs are often helped with antibiotic medicine.  HOME CARE   If given, take antibiotics as told by your doctor. Finish them even if you start to feel better.  Drink enough fluids to keep your pee (urine) clear or pale yellow.  Avoid tea, drinks with caffeine, and bubbly (carbonated) drinks.  Pee often. Avoid holding your pee in for a long time.  Pee before and after having sex (intercourse).  Wipe from front to back after you poop (bowel movement) if you are a woman. Use each tissue only once. GET HELP RIGHT AWAY IF:   You have back pain.  You have lower belly (abdominal) pain.  You have chills.  You feel sick to your stomach (nauseous).  You throw up (vomit).  Your burning or discomfort with peeing does not go away.  You have a fever.  Your symptoms are not better in 3 days. MAKE SURE YOU:   Understand these instructions.  Will watch your condition.  Will get help right away if you are not doing well or get worse. Document Released: 06/09/2007 Document Revised: 09/15/2011 Document Reviewed: 07/22/2011 [facility] Patient Information 2015 [place], [place]. This information is not intended to replace advice given to you by your health care provider. Make sure you discuss any questions you have with your health care provider. Urinary Tract Infection A urinary tract infection (UTI) can occur any place along the urinary tract. The tract includes the kidneys, ureters, bladder, and urethra. A type of germ called bacteria often causes a UTI. UTIs are often helped with antibiotic medicine.  HOME CARE   If given, take antibiotics as told by your doctor. Finish them even if you start to feel better.  Drink enough fluids to keep your pee (urine) clear or pale  yellow.  Avoid tea, drinks with caffeine, and bubbly (carbonated) drinks.  Pee often. Avoid holding your pee in for a long time.  Pee before and after having sex (intercourse).  Wipe from front to back after you poop (bowel movement) if you are a woman. Use each tissue only once. GET HELP RIGHT AWAY IF:   You have back pain.  You have lower belly (abdominal) pain.  You have chills.  You feel sick to your stomach (nauseous).  You throw up (vomit).  Your burning or discomfort with peeing does not go away.  You have a fever.  Your symptoms are not better in 3 days. MAKE SURE YOU:   Understand these instructions.  Will watch your condition.  Will get help right away if you are not doing well or get worse. Document Released: 06/09/2007 Document Revised: 09/15/2011 Document Reviewed: 07/22/2011 [facility] Patient Information 2015 [place], [place]. This information is not intended to replace advice given to you by your health care provider. Make sure you discuss any questions you have with your health care provider. Interstitial Cystitis Interstitial cystitis (IC) is a condition that results in discomfort or pain in the bladder and the surrounding pelvic region. The symptoms can be different from case to case and even in the same individual. People may experience:  Mild discomfort.  Pressure.  Tenderness.  Intense pain in the bladder and pelvic area. CAUSES  Because IC varies so much in symptoms and severity, people studying this disease believe it is not one but  several diseases. Some caregivers use the term painful bladder syndrome (PBS) to describe cases with painful urinary symptoms. This may not meet the strictest definition of IC. The term IC / PBS includes all cases of urinary pain that cannot be connected to other causes, such as infection or urinary stones.  SYMPTOMS  Symptoms may include:  An urgent need to urinate.  A frequent need to urinate.  A combination of  these symptoms. Pain may change in intensity as the bladder fills with urine or as it empties. Women's symptoms often get worse during menstruation. They may sometimes experience pain with vaginal intercourse. Some of the symptoms of IC / PBS seem like those of bacterial infection. Tests do not show infection. IC / PBS is far more common in women than in men.  DIAGNOSIS  The diagnosis of IC / PBS is based on:  Presence of pain related to the bladder, usually along with problems of frequency and urgency.  Not finding other diseases that could cause the symptoms.  Diagnostic tests that help rule out other diseases include:  Urinalysis.  Urine culture.  Cystoscopy.  Biopsy of the bladder wall.  Distension of the bladder under anesthesia.  Urine cytology.  Laboratory examination of prostate secretions. A biopsy is a tissue sample that can be looked at under a microscope. Samples of the bladder and urethra may be removed during a cystoscopy. A biopsy helps rule out bladder cancer. TREATMENT  Scientists have not yet found a cure for IC / PBS. Patients with IC / PBS do not get better with antibiotic therapy. Caregivers cannot predict who will respond best to which treatment. Symptoms may disappear without explanation. Disappearing symptoms may coincide with an event such as a change in diet or treatment. Even when symptoms disappear, they may return after days, weeks, months, or years.  Because the causes of IC / PBS are unknown, current treatments are aimed at relieving symptoms. Many people are helped by one or a combination of the treatments. As researchers learn more about IC / PBS, the list of potential treatments will change. Patients should discuss their options with a caregiver. SURGERY  Surgery should be considered only if all available treatments have failed and the pain is disabling. Many approaches and techniques are used. Each approach has its own advantages and complications.  Advantages and complications should be discussed with a urologist. Your caregiver may recommend consulting another urologist for a second opinion. Most caregivers are reluctant to operate because the outcome is unpredictable. Some people still have symptoms after surgery.  People considering surgery should discuss the potential risks and benefits, side effects, and long- and short-term complications with their family, as well as with people who have already had the procedure. Surgery requires anesthesia, hospitalization, and in some cases weeks or months of recovery. As the complexity of the procedure increases, so do the chances for complications and for failure. HOME CARE INSTRUCTIONS   All drugs, even those sold over the counter, have side effects. Patients should always consult a caregiver before using any drug for an extended amount of time. Only take over-the-counter or prescription medicines for pain, discomfort, or fever as directed by your caregiver.  Many patients feel that smoking makes their symptoms worse. How the by-products of tobacco that are excreted in the urine affect IC / PBS is unknown. Smoking is the major known cause of bladder cancer. One of the best things smokers can do for their bladder and their overall health is to  quit.  Many patients feel that gentle stretching exercises help relieve IC / PBS symptoms.  Methods vary, but basically patients decide to empty their bladder at designated times and use relaxation techniques and distractions to keep to the schedule. Gradually, patients try to lengthen the time between scheduled voids. A diary in which to record voiding times is usually helpful in keeping track of progress. MAKE SURE YOU:   Understand these instructions.  Will watch your condition.  Will get help right away if you are not doing well or get worse. Document Released: 08/22/2003 Document Revised: 03/15/2011 Document Reviewed: 11/06/2007 [facility] Patient  Information 2015 [place], [place]. This information is not intended to replace advice given to you by your health care provider. Make sure you discuss any questions you have with your health care provider.

## 2014-02-01 NOTE — Telephone Encounter (Signed)
Pt has appt today at 1:30 pm to see [name] NP.

## 2014-02-15 ENCOUNTER — Telehealth: Payer: Self-pay

## 2014-02-15 NOTE — Telephone Encounter (Signed)
[name] pts daughter left v/m; pt was seen 02/01/14 for UTI; today pt complaining with pain upon urination; [name] wants to know if pt needs to be seen or what to do? (no available appts today at [facility] [place]). DRP signed to speak with [name].

## 2014-02-15 NOTE — Telephone Encounter (Signed)
noted 

## 2014-02-15 NOTE — Telephone Encounter (Signed)
Spoke to [name] and advised that per [place], pt will need OV. [name] states pt indicates that she is feeling a little better and can wait to be seen. Daughter advised if pt worsens over the weekend, take to [facility] or make Monday appt if needed. [name] verbally expressed understanding.

## 2014-02-24 ENCOUNTER — Other Ambulatory Visit: Payer: Self-pay | Admitting: Family Medicine

## 2014-02-28 DIAGNOSIS — I279 Pulmonary heart disease, unspecified: Secondary | ICD-10-CM | POA: Diagnosis not present

## 2014-02-28 DIAGNOSIS — J449 Chronic obstructive pulmonary disease, unspecified: Secondary | ICD-10-CM | POA: Diagnosis not present

## 2014-02-28 DIAGNOSIS — R0602 Shortness of breath: Secondary | ICD-10-CM | POA: Diagnosis not present

## 2014-02-28 DIAGNOSIS — J9622 Acute and chronic respiratory failure with hypercapnia: Secondary | ICD-10-CM | POA: Diagnosis not present

## 2014-03-08 ENCOUNTER — Emergency Department: Payer: Self-pay | Admitting: Emergency Medicine

## 2014-03-08 DIAGNOSIS — S299XXA Unspecified injury of thorax, initial encounter: Secondary | ICD-10-CM | POA: Diagnosis not present

## 2014-03-08 DIAGNOSIS — J984 Other disorders of lung: Secondary | ICD-10-CM | POA: Diagnosis not present

## 2014-03-08 DIAGNOSIS — S0501XA Injury of conjunctiva and corneal abrasion without foreign body, right eye, initial encounter: Secondary | ICD-10-CM | POA: Diagnosis not present

## 2014-03-08 DIAGNOSIS — Z7902 Long term (current) use of antithrombotics/antiplatelets: Secondary | ICD-10-CM | POA: Diagnosis not present

## 2014-03-08 DIAGNOSIS — Z79899 Other long term (current) drug therapy: Secondary | ICD-10-CM | POA: Diagnosis not present

## 2014-03-08 DIAGNOSIS — R079 Chest pain, unspecified: Secondary | ICD-10-CM | POA: Diagnosis not present

## 2014-03-08 DIAGNOSIS — Z7951 Long term (current) use of inhaled steroids: Secondary | ICD-10-CM | POA: Diagnosis not present

## 2014-03-08 DIAGNOSIS — Z7982 Long term (current) use of aspirin: Secondary | ICD-10-CM | POA: Diagnosis not present

## 2014-03-11 DIAGNOSIS — S0501XA Injury of conjunctiva and corneal abrasion without foreign body, right eye, initial encounter: Secondary | ICD-10-CM | POA: Diagnosis not present

## 2014-03-15 ENCOUNTER — Other Ambulatory Visit: Payer: Self-pay | Admitting: Family Medicine

## 2014-03-18 ENCOUNTER — Ambulatory Visit: Payer: Self-pay | Admitting: Cardiovascular Disease

## 2014-03-28 ENCOUNTER — Telehealth: Payer: Self-pay | Admitting: Family Medicine

## 2014-03-28 NOTE — Telephone Encounter (Signed)
PTS SPOUSE DROPPED OFF HANDICAP PLACARD TO BE FILLED OUT. PLEASE CALL WHEN COMPLETED

## 2014-04-01 NOTE — Telephone Encounter (Signed)
Spoke to pts daughter and informed her paperwork is available for pickup from the front desk

## 2014-04-01 NOTE — Telephone Encounter (Signed)
Placed in Dr Hulen Shouts inbox for review and completion

## 2014-04-01 NOTE — Telephone Encounter (Signed)
Form signed and placed in Waynetta's box for completion.

## 2014-04-08 ENCOUNTER — Encounter: Payer: Self-pay | Admitting: Cardiovascular Disease

## 2014-04-08 ENCOUNTER — Ambulatory Visit (INDEPENDENT_AMBULATORY_CARE_PROVIDER_SITE_OTHER): Payer: Medicare Other | Admitting: Cardiovascular Disease

## 2014-04-08 VITALS — BP 130/60 | HR 70 | Ht 64.0 in | Wt 202.2 lb

## 2014-04-08 DIAGNOSIS — I739 Peripheral vascular disease, unspecified: Secondary | ICD-10-CM

## 2014-04-08 DIAGNOSIS — I10 Essential (primary) hypertension: Secondary | ICD-10-CM

## 2014-04-08 DIAGNOSIS — E785 Hyperlipidemia, unspecified: Secondary | ICD-10-CM | POA: Diagnosis not present

## 2014-04-08 DIAGNOSIS — I779 Disorder of arteries and arterioles, unspecified: Secondary | ICD-10-CM | POA: Insufficient documentation

## 2014-04-08 DIAGNOSIS — I6523 Occlusion and stenosis of bilateral carotid arteries: Secondary | ICD-10-CM

## 2014-04-08 DIAGNOSIS — R0789 Other chest pain: Secondary | ICD-10-CM

## 2014-04-08 DIAGNOSIS — I2510 Atherosclerotic heart disease of native coronary artery without angina pectoris: Secondary | ICD-10-CM

## 2014-04-08 NOTE — Progress Notes (Signed)
Primary care physician: Dr. Deborra Medina  HPI  This is a pleasant 81 year old female who is here today for followup visit regarding coronary artery disease. She has known history of coronary artery disease with previous LAD stent in 2004 at [facility], vascular dementia, CVA, carotid artery stenting, peripheral arterial disease, hypertension and hyperlipidemia. She was hospitalized briefly at [facility] with atypical chest pain.  She had a previous echocardiogram in 2011 which showed normal LV systolic function.  She underwent an outpatient nuclear stress test in 02/2013 which was normal. Metoprolol was discontinued due to bradycardia. She lives at [facility] [place] with her husband. She complains of occasional chest tightness. No significant worsening. Dyspnea is stable. She is on home oxygen. She has been on dual antiplatelet therapy for many years given previous LAD stent and carotid stenting.   Allergies  Allergen Reactions  . Codeine Other (See Comments)    Coma for 2 days  . Prednisone Other (See Comments)    Weight gain,puffy face  . Shellfish Allergy      Current Outpatient Prescriptions on File Prior to Visit  Medication Sig Dispense Refill  . ADVAIR DISKUS 250-50 MCG/DOSE AEPB TAKE 1 PUFF TWICE A DAY 60 each 5  . albuterol (PROVENTIL HFA;VENTOLIN HFA) 108 (90 BASE) MCG/ACT inhaler Inhale 2 puffs into the lungs every 6 (six) hours as needed for wheezing. 1 Inhaler 0  . amLODipine (NORVASC) 5 MG tablet TAKE 1 TABLET BY MOUTH EVERY DAY 90 tablet 0  . aspirin EC 81 MG tablet Take 81 mg by mouth daily.      Marland Kitchen atorvastatin (LIPITOR) 40 MG tablet Take 1 tablet (40 mg total) by mouth daily. 90 tablet 1  . clopidogrel (PLAVIX) 75 MG tablet TAKE 1 TABLET BY MOUTH DAILY 30 tablet 5  . gabapentin (NEURONTIN) 800 MG tablet Take 1 tablet (800 mg total) by mouth 2 (two) times daily. 60 tablet 3  . levothyroxine (SYNTHROID, LEVOTHROID) 25 MCG tablet TAKE 1 TABLET EVERY DAY 30 tablet 5  . lisinopril  (PRINIVIL,ZESTRIL) 10 MG tablet TAKE 2 TABLETS (20 MG TOTAL) BY MOUTH DAILY. 60 tablet 5  . nitroGLYCERIN (NITROSTAT) 0.4 MG SL tablet Place 0.4 mg under the tongue every 5 (five) minutes as needed.      . ranitidine (ZANTAC) 300 MG tablet TAKE 1 TABLET (300 MG TOTAL) BY MOUTH AT BEDTIME. 90 tablet 1  . sertraline (ZOLOFT) 100 MG tablet TAKE 1 TABLET BY MOUTH EVERY DAY 30 tablet 1  . tiotropium (SPIRIVA HANDIHALER) 18 MCG inhalation capsule Place 1 capsule (18 mcg total) into inhaler and inhale daily. 90 capsule 2  . traZODone (DESYREL) 50 MG tablet TAKE 1/2 TO 1 TABLET AT BEDTIME AS NEEDED FOR SLEEP 30 tablet 2  . Vitamin D, Ergocalciferol, (DRISDOL) 50000 UNITS CAPS capsule Take 1 capsule (50,000 Units total) by mouth every 7 (seven) days. 6 capsule 3   No current facility-administered medications on file prior to visit.     Past Medical History  Diagnosis Date  . HTN (hypertension)   . COPD (chronic obstructive pulmonary disease)   . Depression   . Thyroid disease   . PMR (polymyalgia rheumatica)     with h/o steroid induced myopathy  . Anxiety   . Diastolic dysfunction     Echo 05/11/2010, EF 65-70%  . TIA (transient ischemic attack)     11/05 s/p L CEA  . Kidney infection   . Carotid artery occlusion   . CAD (coronary artery disease)  h/o NSTEMI, LAD stent placement 10/04, cath 05/11/2010  . HLD (hyperlipidemia)      Past Surgical History  Procedure Laterality Date  . Cholecystectomy    . Carotid endarterectomy      left with stent placement 11/2003  . Iliac artery stent      right, 2001  . Cardiac catheterization      [facility]  . Cardiac catheterization    . Cardiac catheterization    . Lung biopsy       History reviewed. No pertinent family history.   History   Social History  . Marital Status: Married    Spouse Name: N/A  . Number of Children: N/A  . Years of Education: N/A   Occupational History  . retired     Pharmacist, hospital   Social History Main Topics  .  Smoking status: Former Smoker -- 1.00 packs/day for 25 years    Types: Cigarettes    Quit date: 01/05/1996  . Smokeless tobacco: Never Used  . Alcohol Use: No  . Drug Use: No  . Sexual Activity: Not on file   Other Topics Concern  . Not on file   Social History Narrative   Lives with husband, [name] at [place].   Daughters, [name] and [name] very involved.     PHYSICAL EXAM   BP 130/60 mmHg  Pulse 70  Ht 5\' 4"  (1.626 m)  Wt 202 lb 4 oz (91.74 kg)  BMI 34.70 kg/m2 Constitutional: She is oriented to person, place, and time. She appears well-developed and well-nourished. No distress.  HENT: No nasal discharge.  Head: Normocephalic and atraumatic.  Eyes: Pupils are equal and round. No discharge.  Neck: Normal range of motion. Neck supple. No JVD present. No thyromegaly present.  Cardiovascular: Normal rate, regular rhythm, normal heart sounds. Exam reveals no gallop and no friction rub. No murmur heard.  Pulmonary/Chest: Effort normal and breath sounds normal. No stridor. No respiratory distress. She has no wheezes. She has no rales. She exhibits no tenderness.  Abdominal: Soft. Bowel sounds are normal. She exhibits no distension. There is no tenderness. There is no rebound and no guarding.  Musculoskeletal: Normal range of motion. She exhibits no edema and no tenderness.  Neurological: She is alert and oriented to person, place, and time. Coordination normal.  Skin: Skin is warm and dry. No rash noted. She is not diaphoretic. No erythema. No pallor.  Psychiatric: She has a normal mood and affect. Her behavior is normal. Judgment and thought content normal.     EKG: Sinus  Rhythm  Low voltage in precordial leads.   ABNORMAL      ASSESSMENT AND PLAN

## 2014-04-08 NOTE — Assessment & Plan Note (Signed)
Previous carotid stenting with no recent surgery. I requested carotid Doppler.

## 2014-04-08 NOTE — Assessment & Plan Note (Signed)
Blood pressure is well controlled on current medications. 

## 2014-04-08 NOTE — Patient Instructions (Signed)
Your physician has requested that you have a carotid duplex. This test is an ultrasound of the carotid arteries in your neck. It looks at blood flow through these arteries that supply the brain with blood. Allow one hour for this exam. There are no restrictions or special instructions.  Your physician recommends that you continue on your current medications as directed. Please refer to the Current Medication list given to you today.  Your physician wants you to follow-up in: 6 months with Dr. Fletcher Anon. You will receive a reminder letter in the mail two months in advance. If you don't receive a letter, please call our office to schedule the follow-up appointment.

## 2014-04-08 NOTE — Assessment & Plan Note (Signed)
Lab Results  Component Value Date   CHOL 153 10/12/2013   HDL 31.60* 10/12/2013   LDLCALC 190 01/06/2010   LDLDIRECT 87.5 10/12/2013   TRIG 208.0* 10/12/2013   CHOLHDL 5 10/12/2013   Lipid profile improved. Significantly on atorvastatin.

## 2014-04-08 NOTE — Assessment & Plan Note (Signed)
She seems to be stable from a cardiac standpoint with no significant symptoms of angina. I recommend continuing medical therapy. She has been on dual antiplatelet therapy for many years which is optional at the present time. I might consider stopping aspirin and keeping her on Plavix.

## 2014-04-11 ENCOUNTER — Other Ambulatory Visit: Payer: Self-pay | Admitting: Family Medicine

## 2014-04-11 DIAGNOSIS — H4011X2 Primary open-angle glaucoma, moderate stage: Secondary | ICD-10-CM | POA: Diagnosis not present

## 2014-04-13 ENCOUNTER — Other Ambulatory Visit: Payer: Self-pay | Admitting: Family Medicine

## 2014-04-15 NOTE — Telephone Encounter (Signed)
Last OV 04/08/2014, Last filled 01/14/2014

## 2014-04-17 ENCOUNTER — Emergency Department
Admit: 2014-04-17 | Disposition: A | Discharge status: Home / Self Care | Payer: Self-pay | Admitting: Emergency Medicine

## 2014-04-17 DIAGNOSIS — Z7901 Long term (current) use of anticoagulants: Secondary | ICD-10-CM | POA: Diagnosis not present

## 2014-04-17 DIAGNOSIS — R55 Syncope and collapse: Secondary | ICD-10-CM | POA: Diagnosis not present

## 2014-04-17 DIAGNOSIS — M25569 Pain in unspecified knee: Secondary | ICD-10-CM | POA: Diagnosis not present

## 2014-04-17 DIAGNOSIS — Z7982 Long term (current) use of aspirin: Secondary | ICD-10-CM | POA: Diagnosis not present

## 2014-04-17 DIAGNOSIS — R109 Unspecified abdominal pain: Secondary | ICD-10-CM | POA: Diagnosis not present

## 2014-04-17 DIAGNOSIS — N390 Urinary tract infection, site not specified: Secondary | ICD-10-CM | POA: Diagnosis not present

## 2014-04-17 DIAGNOSIS — W19XXXA Unspecified fall, initial encounter: Secondary | ICD-10-CM | POA: Diagnosis not present

## 2014-04-17 DIAGNOSIS — S0990XA Unspecified injury of head, initial encounter: Secondary | ICD-10-CM | POA: Diagnosis not present

## 2014-04-17 DIAGNOSIS — S299XXA Unspecified injury of thorax, initial encounter: Secondary | ICD-10-CM | POA: Diagnosis not present

## 2014-04-17 DIAGNOSIS — R0781 Pleurodynia: Secondary | ICD-10-CM | POA: Diagnosis not present

## 2014-04-17 DIAGNOSIS — S20212A Contusion of left front wall of thorax, initial encounter: Secondary | ICD-10-CM | POA: Diagnosis not present

## 2014-04-17 LAB — URINALYSIS, COMPLETE
BILIRUBIN, UR: NEGATIVE
Blood: NEGATIVE
GLUCOSE, UR: NEGATIVE mg/dL (ref 0–75)
Ketone: NEGATIVE
NITRITE: POSITIVE
Ph: 6 (ref 4.5–8.0)
Protein: NEGATIVE
Specific Gravity: 1.017 (ref 1.003–1.030)

## 2014-04-17 LAB — BASIC METABOLIC PANEL
Anion Gap: 5 — ABNORMAL LOW (ref 7–16)
BUN: 23 mg/dL — AB
Calcium, Total: 8.8 mg/dL — ABNORMAL LOW
Chloride: 104 mmol/L
Co2: 32 mmol/L
Creatinine: 1.01 mg/dL — ABNORMAL HIGH
EGFR (African American): >60
GFR CALC NON AF AMER: 52 — AB
Glucose: 125 mg/dL — ABNORMAL HIGH
POTASSIUM: 3.7 mmol/L
Sodium: 141 mmol/L

## 2014-04-17 LAB — CBC
HCT: 36.8 % (ref 35.0–47.0)
HGB: 11.8 g/dL — ABNORMAL LOW (ref 12.0–16.0)
MCH: 27.9 pg (ref 26.0–34.0)
MCHC: 32.1 g/dL (ref 32.0–36.0)
MCV: 87 fL (ref 80–100)
PLATELETS: 172 10*3/uL (ref 150–440)
RBC: 4.23 10*6/uL (ref 3.80–5.20)
RDW: 16.6 % — ABNORMAL HIGH (ref 11.5–14.5)
WBC: 7.4 10*3/uL (ref 3.6–11.0)

## 2014-04-17 LAB — TROPONIN I

## 2014-04-20 ENCOUNTER — Other Ambulatory Visit: Payer: Self-pay | Admitting: Family Medicine

## 2014-04-21 LAB — URINE CULTURE

## 2014-04-25 DIAGNOSIS — H4011X2 Primary open-angle glaucoma, moderate stage: Secondary | ICD-10-CM | POA: Diagnosis not present

## 2014-04-26 NOTE — Discharge Summary (Signed)
PATIENT NAME:  Joy [name], Joy [name] MR#:  [id number] DATE OF BIRTH:  11-Nov-1930  DATE OF ADMISSION:  08/22/2012 DATE OF DISCHARGE:  08/22/2012  CONSULTANTS: Dr. Irish Elders from neurology.   CHIEF COMPLAINT: Brought in for altered mental status.   PRIMARY CARE PHYSICIAN: Arnette Norris.   DISCHARGE DIAGNOSES 1.  Metabolic encephalopathy, likely from drug effect, resolved.  2.  Chronic obstructive pulmonary disease.  3.  Hypertension.  4.  Constipation.  5.  History of coronary artery disease.  6.  History of chronic respiratory failure with 2 liters of oxygen via nasal cannula.  7.  Mitochondrial myopathy, encephalopathy, lactacidosis and stroke diagnosed in 2012 at [facility].  8.  Polymyalgia rheumatica.  9.  Hypothyroidism.  10.  History of cerebrovascular accident.  11.  History of depression and anxiety.  12.  Obesity.  13.  Hyperlipidemia.  14.  Peripheral neuropathy.  15.  Vascular dementia.   MEDICATIONS 1.  Amlodipine 5 mg daily.  2.  Lisinopril 20 mg daily.  3.  Gabapentin 800 mg in the morning and 1600 mg in the evening.  4.  Metoprolol tartrate 25 mg, 1/2 tab 2 times a day.  5.  Ranitidine 300 mg once a day.  6.  Sertraline 50 mg once a day.  7.  Trazodone 50 mg, 1/2 to 1 tab once a day as needed for sleep.  8.  Advair 250/50 mcg inhaled 1 puff 2 times a day.  9.  Clopidogrel 75 mg in the morning.  10.  Aspirin 81 mg daily.  11.  Atorvastatin 40 mg daily.  12.  Levothyroxine 25 mcg daily.  13.  Apiriva18 mcg 1 cap inhaled daily.  14.  Tylenol 650 mg every 4 hours as needed for pain or headache.  15.  Docusate sodium 100 mg 2 times a day as needed for constipation.  16.  Amlodipine 5 mg daily.   DIET: Low sodium.   OXYGEN:  She will be going home with 2 liters of oxygen via nasal cannula.   ACTIVITY: As tolerated.   FOLLOWUP: Please follow with PCP within 1 to 2 weeks and follow with blood pressure and adjust medications accordingly.   SIGNIFICANT LABS AND IMAGING:  Initial BUN 21, creatinine 0.85, sodium 138, potassium 4.1. Troponins were 0.08 on arrival and stayed same range. Initial CK-MB was 1.7. LFTs on arrival showed alkaline phosphatase of 171, otherwise within normal limits. Initial WBC 12.5, hemoglobin 13.5, platelets 224. Urine culture showed mixed flora. UA not suggestive of infection. CT of the head without contrast showed atrophy with chronic microvascular ischemia, no acute intracranial abnormality. CT of abdomen and pelvis without contrast, no acute abdominal or pelvic abnormality. Both on August 16. MRI of the brain severely limited with motion artifact but no definitive gross ischemic event. Right foot complete on August 17 showed degenerative changes, no definitive acute bony abnormality. Chest, portable, 1 view showing no acute cardiopulmonary disease.   HISTORY OF PRESENT ILLNESS AND HOSPITAL COURSE: For full details of H and P, please see the dictation on August 16 but, briefly, this is a pleasant 81 year old female with multiple medical conditions who has been brought in for confusion. Also present were family who is at bedside. She was told by staff at the facility that there is a bacterial or viral diarrhea going around in the nursing home but she was not aware that the mom had any GI concerns. She had a headache. She was awake and responding verbally last night but on the day  of admission she was not as much. Of note, there are some medication changes as she recently had some trauma to her foot and was started on some pain medication. She was admitted to the hospitalist service, had a CT of the head and neck. It was thought that the patient likely had altered mental status, likely secondary to metabolic encephalopathy and drug effect of mixing tramadol, Zoloft and trazodone. She was started on some gentle IV fluids. She underwent an MRI which was degraded by motion artifact; however, was also seen by neurology. She will be discharged without the  tramadol. Initially she was started n.p.o. and slowly diet was resumed and advanced, and she has been tolerating it. She has had bowel movements here as before she was constipated. She has had labile blood pressure and amlodipine has been added. At this time, she will be discharged with outpatient followup.   TOTAL TIME SPENT: 35 minutes.   CODE STATUS: The patient is full code.  ____________________________ Vivien Presto, MD sa:cs D: 08/22/2012 14:54:00 ET T: 08/22/2012 15:08:09 ET JOB#: [id number]  cc: Marciano Sequin. Deborra Medina, MD Vivien Presto, MD, <Dictator>    Vivien Presto MD ELECTRONICALLY SIGNED 09/11/2012 12:04

## 2014-04-26 NOTE — Consult Note (Signed)
PATIENT NAME:  Joy [name], Joy [name] MR#:  [id number] DATE OF BIRTH:  1930/08/01  DATE OF CONSULTATION:  08/20/2012  CONSULTING PHYSICIAN:  Leotis Pain, MD  REASON FOR CONSULTATION: Altered mental status.   HISTORY OF PRESENT ILLNESS:  History is obtained from the patient, the patient's daughter who is at bedside and patient's chart. This is a pleasant 81 year old female with past medical history of stroke, hypothyroidism, polymyalgia rheumatica, history of MELAS,  history of coronary artery disease, depression, anxiety, was in her usual state of health until two days ago, Two days ago, the patient is complaining of diffuse pressure-like headache located in bilateral temporal regions, which has improved significantly. The patient admitted to the hospital yesterday with altered mental status and difficulty following commands and not responding to her family members. As per patient's daughter, the patient is a nursing home resident and apparently as per them, there is GI bug that has been going around, but otherwise the patient has not been febrile. The patient has chronic foot pain and she was recently started on tramadol for left lower extremity weakness and pain. The patient is status post CT head and MRI, did not show any acute intracranial pathology, just some generalized atrophy. The patient appears to have improved mental status this morning compared to prior, but still not back to baseline.   LABORATORY DATA:  White blood cell count is 11.0, red blood cells 4.22, hemoglobin is 12.1, hematocrit is 35.8, platelet count is 199. The patient's LFTs included ALT 14, AST is 29, alkaline phosphatase is 137, bilirubin total 0.7, mag is 1.9. Troponin slightly elevated 0.08, elevated from 0.07 on the first one.    PAST MEDICAL HISTORY: Including chronic respiratory failure, MELAS, polymyalgia rheumatic, hypothyroidism, CVA, coronary artery disease, depression and anxiety, obesity, obstructive pulmonary disease,  hyperlipidemia, peripheral neuropathy, vascular dementia.   PAST SURGICAL HISTORY: Includes cholecystectomy, C-section x 3, coronary endarterectomy on the left and the patient has three cardiac stents.   SOCIAL HISTORY: Lives with husband and sister, living/nursing home facility. She is not a smoker. No alcohol intake. She is to work as a Radio producer.   FAMILY HISTORY: Positive for MELAS.  REVIEW OF SYSTEMS: Currently no visual changes, no shortness of breath, mild chest pain as described as pressure-like, no diarrhea. No constipation, no heat or cold intolerance. No weakness on one side of the body compared to the other. No anxiety. No depression.   PHYSICAL EXAMINATION: VITAL SIGNS: Includes: Temperature of 98, pulse 85, blood pressure 184/67, pulse oximetry is 97 on 2 liters nasal cannula.  NEUROLOGIC: The patient is alert, awake and oriented to name,  type of facility. She could not tell me the date, time, president the [place] or the day of the week. It appears the patient's attention is intact, but short term memory is affected. She could not repeat any of the 3 words that were  given to her after a minute. She could not do serial 7, could not count days of the week backwards. On her cranial nerve examination: Pupils 3 mm, 2 mm, reactive bilaterally. Visual fields intact. Facial sensation intact. Facial motor is intact. Tongue is midline. Uvula elevates symmetrically. Motor strength appears to be 5-/5 bilateral upper extremities 4+/5 bilateral lower extremities. Reflexes are diminished throughout. Sensation intact to light touch and temperature. Coordination finger-to-nose intact. Gait not assessed   LABORATORY, DIAGNOSTIC AND RADIOLOGIC DATA:  CAT scan of the head and MRI of the brain reviewed. No acute intracranial pathology   ASSESSMENT: This  is a 81 year old female with history of MELAS, chronic respiratory failure, polymyalgia rheumatica, hypothyroidism, history of prior previous  stroke, status post carotid endarterectomy on the left, coronary artery disease with stents, admitted with altered mental status. As per the patient's family members who are at bedside, her current mental status has significantly improved. She is able to follow two-step commands and is close to her baseline. She does state that she has been progressively having short-term memory deficits, and on my examination, she had difficulty with serial 7s, difficulty with counting the days of the week backwards and difficulty with remembering three words after one minute. I think the current condition was contributed by medications specifically tramadol, which can lower the seizure threshold in the context of Zoloft and trazodone.   PLAN: Please discontinue tramadol and the patient should not receive it in  the nursing care facility. She has an appointment with neurologist September 5 for dementia work-up. No further neurological work-up at this time, but EKG and troponins x 2 ordered because the patient does have slight chest pain and last troponin was 0.08, a rise from 0.07. It was a pleasure seeing this patient   ____________________________ Leotis Pain, MD yz:cc D: 08/20/2012 13:09:13 ET T: 08/20/2012 17:30:22 ET JOB#: [id number]  cc: Leotis Pain, MD, <Dictator> Leotis Pain MD ELECTRONICALLY SIGNED 09/18/2012 12:11

## 2014-04-26 NOTE — H&P (Signed)
PATIENT NAME:  Joy [name], Joy [name] MR#:  [id number] DATE OF BIRTH:  Feb 17, 1930  DATE OF ADMISSION:  08/19/2012  PRIMARY CARE PHYSICIAN: Marciano Sequin. Deborra Medina, MD   REFERRING PHYSICIAN: Yetta Numbers. Karma Greaser, MD  CHIEF COMPLAINT: Altered mental status.   HISTORY OF PRESENT ILLNESS: This is a very nice 81 year old female who has history of CVA in the past, hypothyroidism, polymyalgia rheumatica, history of MELAS (mitochondrial encephalopathy, lactic acidosis with stroke-like episodes), who also has coronary artery disease, depression and anxiety. The patient has been in her normal state of health up until yesterday. She started complaining of headache, which apparently was pretty intense, located on the right temple, radiating to left temple. The headache was throbbing. At this moment, the patient is not able to give me characteristics of her headache, intensity or other since she has altered mental status, but this information has been recorded from her daughter, who is at the bedside. Other than that, the daughter denies any fever, chills, cough, diarrhea or constipation. The patient's daughter has been told that there is bacterial or viral diarrhea going around in the nursing home, but she is not aware of her mom having any GI concerns. The patient was awake and responding verbally last night, being her normal self during the day, and then at night starting to get lethargic. The patient went to bed, and this morning, she has been unarousable up until she came over here. The first time that she opened her eyes was on the CT scan, and occasionally she mumbles things, but she is not following any commands. Apparently the headache has been going on since Thursday, and she has occasional headaches, but not like this. Unknown if the patient had any photophobia. Whenever she was visited by her daughter this morning, her room was really hot, for which she got a cooling blanket. She had an episode of ankle twist which happened last  week, for which she has been started on tramadol. On top of that, she is getting trazodone. The patient had been just lying in bed mostly since this happened, and prior to that, she was living independently with her husband, but they are both low functioning. They had some assisted living conditions. She has been complaining of not remembering things. The family noticed memory loss, but she has never been formally diagnosed with dementia. Overall, the patient comes to the ER and gets evaluated by Dr. Hinda Kehr. Her labs are not revealing of any infection, although she has a slight elevation of white blood cells of 12.5. There is no significant focus of infection. Her urinalysis was overall normal. Her LFTs show mild increase in alkaline phosphatase and no increase in bilirubin or AST. Her CK is 110, and her troponin is 0.08. Chest x-ray did not show any major abnormalities. CT scan of the abdomen was overall unimpressive. The CT of the head showed mostly atrophy with microvascular ischemic disease, without any acute intracranial abnormalities. No bleeding. The patient had some debris or density in the posterolateral left aspect of the urinary bladder, but nonspecific findings related to that. The patient admitted for evaluation of this condition. She looks very dry, for which we are going to put her on IV fluids.   PAST MEDICAL HISTORY:  1. Chronic respiratory failure with 2 liters nasal cannula.  2.  MELAS (mitochondrial encephalopathy, lactic acidosis with stroke-like episodes) diagnosed in 2012 at [facility].  3. Polymyalgia rheumatica.  4. Hypothyroidism.  5. Cerebrovascular accident.  6. Coronary artery disease.  7. Depression  and anxiety.  8. Obesity.  9. Obstructive pulmonary disease.  10. Hyperlipidemia.  11. Peripheral neuropathy.  12. Vascular dementia.   PAST SURGICAL HISTORY:  1. Cholecystectomy.  2. C-section x3.  3. Right cataract.  4. Carotid endarterectomy on the left, complicated  with CVA.  5. Status post 3 stents in her heart.   ALLERGIES: CODEINE, PREDNISONE, SULFA DRUGS, VIOXX, ENTEX, TAPE.  SOCIAL HISTORY: The patient lives with her husband in an assisted living facility. She quit smoking in 1998. She was half to one pack smoker history. No alcohol. No other drugs. She used to be a Radio producer.   FAMILY HISTORY: Positive for MELAS in her son, who died from complications of this, and her mother died from a stroke in her 55s.   REVIEW OF SYSTEMS: Unable to obtain due to the patient's altered mental status. Most of the information obtained from the daughter. Please refer to HPI.   PHYSICAL EXAMINATION:  VITAL SIGNS: Blood pressure of 212/79 on admission, pulse 90, respirations 20, temperature 97.5. Blood pressure coming down to 135/66. GENERAL: The patient is lethargic, noncooperative, occasionally mumbles things, occasionally moves her head, nodding yes or no, but I do not think she is understanding what I am saying to her.  HEENT: Her pupils are equal and reactive. Extraocular movements seem intact. Anicteric sclerae. Pink conjunctivae. No oral lesions. The patient has very dry mucosa.  NECK: Supple. No JVD. No thyromegaly. No adenopathy. No carotid bruits. No rigidity. Kernig and Brudzinski are negative. Trachea central.  CARDIOVASCULAR: Regular rate and rhythm. No murmurs, rubs or gallops are appreciated. No displacement of PMI.  LUNGS: Clear, without any wheezing or crepitus. No use of accessory muscles.  ABDOMEN: Soft, nontender, nondistended. No hepatosplenomegaly. No masses. Bowel sounds are positive.  GENITAL: Negative for external lesions.  EXTREMITIES: No edema, cyanosis or clubbing. There is a bandage of the left ankle after the patient having it twisted. There are changes of coloration with ecchymosis at the level of the foot from the ankle down. No signs of swelling. No signs of DVT. Bevelyn Buckles is negative.  SKIN: No rashes or petechiae, only the ecchymosis  on the lower extremity.  LYMPHATIC: Negative for lymphadenopathy in the neck or supraclavicular areas.  PSYCHIATRIC: Unable to evaluate due to lethargy.  NEUROLOGIC: Cranial nerves II through XII seem overall intact. Strength seems to be equal in all 4 extremities. The patient withdraws to pain and moves around on the bed, moving the 4 extremities. No significant weakness noticed. DTRs +2.  MUSCULOSKELETAL: No significant joint abnormalities other than the left ankle having some swelling. Range of motion is decreased.   RESULTS: BUN 21, creatinine 0.85, sodium 138, potassium 4.1, ALT is 15, AST 27, alkaline phosphatase 171, troponin is 0.08. White blood count is 12.5. Urinalysis not showing any significant infection. CT of the abdomen and pelvis, head and chest x-ray commented on in HPI. EKG: The patient has first-degree AV block; other than that, is sinus rhythm. No ST depression or elevation.   ASSESSMENT AND PLAN: A 81 year old female with history of MELAS (mitochondrial encephalopathy, lactic acidosis with stroke-like episodes), chronic respiratory failure polymyalgia rheumatica, hypothyroidism, history of previous cerebrovascular accident, coronary artery disease, who comes with altered mental status.   1. Altered mental status, likely metabolic encephalopathy. My guess is the patient had a reaction to the medications, recently started on tramadol, mixing it with Zoloft and trazodone. The patient could be just oversedated at this moment. We are going to hold on all  p.o. medications. Monitor closely under telemetry and do an neuro checks every 4 hours. I am going to give some IV fluids since the patient looks severely dehydrated, to promote the clearance of any medications. The patient is going to be evaluated by neurology. Since she has been started on tramadol and having these symptoms, we have to rule out the possibility of seizures since tramadol lowers the threshold for seizure activity. I am not  going to load her up with any medications right now until we get an answer. Since the patient has history of peripheral vascular disease, coronary artery disease, hypertension and previous stroke, we are going to do an MRI to rule out a stroke. Her CT scan is negative at first, but CT scan could be negative within the first 24 hours of an acute stroke. Neurologically, she does not show any lateralization of symptoms.  2. As far as her coronary artery disease, continue Plavix once she is able to take p.o. medications.  3. For hypothyroidism, check TSH.  4. Check also B12.  5. We are going to give aspirin per rectum daily.  6. As far as her dehydration, continue IV fluids.  7. As far as her  MELAS (mitochondrial encephalopathy, lactic acidosis with stroke-like episode), at this moment, she is not showing the same presentation that she showed before when she was diagnosed. Apparently, she will be a patient who is mostly asymptomatic of this.  8. We are going to hold on her blood pressure medications and just give p.r.n. as needed and start Plavix and metoprolol as soon as possible.  9. We are going to hold on any other sedative medications  10. Gastrointestinal prophylaxis with ranitidine.   CODE STATUS: The patient is a full code.   TIME SPENT: I spent about 50 minutes with this patient and the family.   ____________________________ [place] Sink, MD rsg:OSi D: 08/19/2012 13:12:47 ET T: 08/19/2012 13:39:52 ET JOB#: [id number]  cc: [place] Sink, MD, <Dictator> Cowan Pilar America Brown MD ELECTRONICALLY SIGNED 08/19/2012 15:32

## 2014-04-27 ENCOUNTER — Other Ambulatory Visit: Payer: Self-pay | Admitting: Family Medicine

## 2014-04-27 NOTE — H&P (Signed)
PATIENT NAME:  Joy [name], Joy [name] MR#:  [id number] DATE OF BIRTH:  1930/07/16  DATE OF ADMISSION:  04/06/2013  PRIMARY CARE PHYSICIAN:  Dr. Arnette Norris.  CHIEF COMPLAINT: Weakness, lethargy and low O2 sats.   HISTORY OF PRESENT ILLNESS: This is an 81 year old female who resides at [facility] independent living, brought in to the hospital today due to feeling quite lethargic, weak and unable to get up from the floor. She was also noted to have O2 sats in the 60s. The patient herself cannot give most of the history, therefore, the history obtained from the daughter at bedside. The daughter says she came to visit her mother yesterday at the independent living, and she noticed her mother was more lethargic and weak. She also got quite winded as she ambulated. Her mother has also had significant weakness and increasing tremors over the past few days. Normally, she is able to hold a cup of coffee in the right hand, but because of the increasing tremor on the right side, she has been spilling it everywhere. She was also noted to have O2 sats saturation in the 60s as mentioned earlier today, and therefore sent to the ER. In the Emergency Room, the patient's O2 sats have remained stable on a couple of liters. Her chest x-ray did show a questionable chronic right lower lobe infiltrate. She continues to have significant weakness and was noted to be in mild acute renal failure. Hospitalist services were contacted for further treatment and evaluation.   REVIEW OF SYSTEMS:  CONSTITUTIONAL: No documented fever. Positive fatigue and weakness. No weight gain. No weight loss.  EYES: No blurred or double vision.  ENT: No tinnitus. No postnasal drip. No redness of the oropharynx.  RESPIRATORY: No cough. No wheeze. No hemoptysis. Positive dyspnea.  CARDIOVASCULAR: No chest pain. No orthopnea. No palpitations. No syncope.  GASTROINTESTINAL: No nausea. No vomiting, diarrhea or abdominal pain. No melena or hematochezia.   GENITOURINARY: No dysuria. No hematuria.  ENDOCRINE: No polyuria or nocturia. No heat or cold intolerance.  HEMATOLOGIC: Positive anemia. No acute bruising or bleeding.  INTEGUMENTARY: No rashes. No lesions.  MUSCULOSKELETAL: No arthritis. No swelling. No gout.  NEUROLOGIC: No numbness. No tingling. No ataxia. No seizure activity.  PSYCHIATRIC:  No anxiety. No insomnia. No ADD.   PAST MEDICAL HISTORY: Consistent with COPD, oxygen dependent; history of cryoglobulinemia, polymyalgia rheumatica, neuropathy, hypothyroidism, depression, carotid artery stenosis.   ALLERGIES: BENADRYL, CIPRO, CODEINE, MAGNESIUM SULFATE, PREDNISONE, SULFA DRUGS.   SOCIAL HISTORY: Does have a 50 pack-year smoking history. No alcohol abuse. No illicit drug abuse. Resides at independent living with her husband.   FAMILY HISTORY: Both mother and father are deceased, both died from complications of heart disease.   CURRENT MEDICATIONS: Are as follows: Norvasc 5 mg daily, Synthroid 25 mcg daily, lisinopril 20 mg daily, aspirin 81 mg daily, Plavix 75 mg daily, Zoloft 100 mg daily, Neurontin 800 mg in the morning, 1600 mg at bedtime; Spiriva 1 puff daily, Advair 250/50, 1 puff b.i.d.; Lipitor 40 mg daily, Zantac 300 mg at bedtime, trazodone 50 mg at bedtime.   PHYSICAL EXAMINATION: Presently is as follows: VITAL SIGNS: Noted to be temperature of 99.4, pulse 83, respirations 20, blood pressure 109/47, sats 94% on room air.  GENERAL: She is a pleasant-appearing female, lethargic, but in no apparent distress.  HEENT:  She is atraumatic, normocephalic. Extraocular muscles are intact. Pupils equal and reactive to light. Sclerae anicteric. No conjunctival injection. No pharyngeal erythema.  NECK: Supple. There is no  jugular venous distention. No bruits. No lymphadenopathy or thyromegaly.  HEART: Regular rate and rhythm. No murmurs. No rubs. No clicks.  LUNGS: Clear to auscultation bilaterally. No rales. No rhonchi. No wheezes.   ABDOMEN: Soft, flat, nontender, nondistended. Has good bowel sounds. No hepatosplenomegaly appreciated.  EXTREMITIES: No evidence of any cyanosis, clubbing or peripheral edema. Has positive pedal and radial pulses bilaterally.  NEUROLOGICAL: The patient is alert, awake and oriented x 1. Lethargic, globally weak but moves all extremities spontaneously. No other focal motor or sensory deficits appreciated bilaterally.  SKIN: Moist, warm with no rashes.  LYMPHATIC: There is no cervical or axillary lymphadenopathy.   LABORATORY DATA: Showed a serum glucose of 117, BUN 33, creatinine 1.67, sodium 139, potassium 4.4, chloride 105. Bicarb is 30. LFTs are within normal limits. Troponin 0.04. White cell count 8.7, hemoglobin 9.6, hematocrit 28.5, platelet count of 297. Urinalysis within normal limits. The patient had an ABG that showed a pH of 7.38, pCO2 of 51, pO2 of 66, sats of 95%.  The patient had a chest x-ray done, which showed osteopenia and a chronic infiltrate in the right lower lobe laterally.   ASSESSMENT AND PLAN: This is an 81 year old female with a history of chronic obstructive pulmonary disease on chronic oxygen, history of cryoglobulinemia, polymyalgia rheumatica, neuropathy, hypothyroidism, depression, who presents to the hospital due to weakness, altered mental status and also noted to have low O2 sats in the 60s at home.  1.  Altered mental status/weakness. The exact etiology of this is unclear, but likely related to deconditioning, complicated by possible underlying pneumonia. The patient also has underlying tremor and has a family history of Parkinson disease, therefore some concern for that. For now, we will treat her for nosocomial pneumonia with vancomycin, Zosyn and follow her mental status. She had a recent CT head done a few days back, which was negative. Her urinalysis was also negative for UTI.  We will check an ammonia level, get a physical therapy consult to assess her mobility,  also get a neurology consult.  2.  Acute renal failure. This is a mild acute renal failure related to dehydration and poor p.o. intake. We will hydrate her with IV fluids, follow BUN and creatinine, hold her ACE inhibitor for now.  3.  Nosocomial pneumonia as seen on the chest x-ray. Questionable if this is chronic versus acute. Empirically, I will treat her with IV vancomycin and Zosyn. Follow blood and sputum cultures.  4.  Chronic obstructive pulmonary disease. No acute exacerbation. Continue with Advair and Spiriva.  5.  Hypothyroidism. Continue Synthroid.  6.  Hypertension. Continue her Norvasc.  She is hemodynamically stable. Hold her ACE inhibitor given her acute renal failure.  7.  Depression.  Continue Zoloft and trazodone.   The patient is a FULL CODE.   Time spent on admission is 50 minutes.    ____________________________ Belia Heman. Verdell Carmine, MD vjs:dmm D: 04/06/2013 17:33:00 ET T: 04/06/2013 19:05:11 ET JOB#: [id number]  cc: Belia Heman. Verdell Carmine, MD, <Dictator> Henreitta Leber MD ELECTRONICALLY SIGNED 04/15/2013 18:47

## 2014-04-27 NOTE — Consult Note (Signed)
PATIENT NAME:  Joy [name], Joy [name] MR#:  [id number] DATE OF BIRTH:  October 08, 1930  DATE OF CONSULTATION:  04/07/2013  REFERRING PHYSICIAN:   CONSULTING PHYSICIAN:  Leotis Pain, MD  DATE OF ADMISSION: 04/06/2013  REASON FOR NEUROLOGICAL CONSULTATION: Altered mental status and tremor.  HISTORY OF PRESENT ILLNESS: This is an 81 year old female resident of [facility]. Home who presented with lethargy, generalized weakness, found to have a pulse oximetry in the 60s. Admitted with suspected right lower lobe pneumonia, started on antibiotics. Reason for neurological consultation is questionable tremor and increased altered mental status. I have seen this patient in the past patient. Patient has a strong family history of Parkinson's disease. The patient's mental status significantly improved this morning and as per daughter, the patient is close to baseline.   REVIEW OF SYSTEMS:  GENERAL: No fever. No weight gain, weight loss, but positive for generalized fatigue.  EYES: No blurred vision, double vision.  ENT: No tinnitus. No postnasal drip.  RESPIRATORY: No cough. No wheezing. No hemoptysis.  CARDIOVASCULAR: Chest pain, orthopnea, palpitations.  GASTROINTESTINAL: No nausea. No vomiting. No diarrhea. GENITOURINARY: No dysuria, no hematuria. ENDOCRINE: No polyuria, no nocturia.  PSYCHIATRIC: No anxiety or depression as per patient.  PAST MEDICAL HISTORY: Consistent with COPD, oxygen dependent, history of cryoglobulinemia, polymyalgia rheumatica, neuropathy, hypothyroidism, depression, and carotid stenosis.   ALLERGIES: INCLUDE BENTYL, CIPRO, CODEINE, MAGNESIUM SULFATE, PREDNISONE, AND SULFA DRUGS.   SOCIAL HISTORY: The patient is a chronic 50 pack smoker.   HOME MEDICATIONS: Include Norvasc, Synthroid, lisinopril, aspirin, Plavix, Zoloft, Neurontin, Spiriva, Advair, Lipitor, and Zantac.   LABORATORY WORK:  Reviewed.  IMAGING: Has been reviewed.   PHYSICAL EXAMINATION:   VITAL SIGNS: Include a temperature of 97.7, pulse 89, respirations 20, blood pressure 124/52. GENERAL: The patient is alert to month, not the date, able to tell me who her daughter is. Speech appears to be fluent.  HEENT: Extraocular movements are intact. Pupils 4 mm to 2 mm, reactive bilaterally. Facial sensation intact. Facial motor is intact. Tongue is midline. Uvula elevates symmetrically. NEUROLOGICAL: Shoulder shrug intact. Motor strength appears to be 4+/5 bilateral upper and lower extremities. Sensation intact to light touch and temperature. In terms of tremor., the patient does have on and off slight pill-rolling tremor that changes in amplitude and frequency on resting. Tremor varies with change of position. The patient's tremor is exacerbated. She has difficulty with coordination, difficulty with finger-to-nose. Reflexes 2+ symmetrical.   IMPRESSION: An 81 year old female with history of chronic obstructive diseased, chronic oxygen, a history of cryoglobulinemia, polymyalgia rheumatica, neuropathy, hypothyroidism, depression, presented with generalized weakness and hypertension as well as altered mental status and on further evaluation, noted to have worsening of her tremor. I am not completely convinced this is a Parkinsonian tremor as the frequency changes, the amplitude changes. It is worse on changing of positions so it is more consistent with essential tremor at this point. The patient's mental status has significantly improved at the same time. The patient has been on antibiotics and on hydration.   PLAN: At this time I do not think we need any further imaging from a neurological standpoint. I would like to see the patient see movement disorder specialist. If they are not available in the area, refer to neurology clinic at [facility]. Continue the hydration, physical therapy, and occupational therapy. The case discussed with patient and the patient's daughter at bedside.   Thank you, it was a  pleasure seeing this patient. Please call with any questions.  ____________________________ Leotis Pain, MD yz:lt D: 04/07/2013 18:39:00 ET T: 04/07/2013 22:57:57 ET JOB#: [id number]  cc: Leotis Pain, MD, <Dictator> Leotis Pain MD ELECTRONICALLY SIGNED 05/02/2013 11:29

## 2014-04-27 NOTE — Discharge Summary (Signed)
Dates of Admission and Diagnosis:  Date of Admission 06-Apr-2013   Date of Discharge 10-Apr-2013   Admitting Diagnosis RLL PNA   Final Diagnosis 1. RLL pneumonia 2. Acute on chronic respiratory failure 3. COPD 4. CKD3 5. Tremors 6. generalised weakness 7. Acute encephalopathy with possible mild dementia 8. Chronic anemia 9. Hypothyroidism 10. HTN 11. Depression    Chief Complaint/History of Present Illness CHIEF COMPLAINT: Weakness, lethargy and low O2 sats.   HISTORY OF PRESENT ILLNESS: This is an 81 year old female who resides at [facility] independent living, brought in to the hospital today due to feeling quite lethargic, weak and unable to get up from the floor. She was also noted to have O2 sats in the 60s. The patient herself cannot give most of the history, therefore, the history obtained from the daughter at bedside. The daughter says she came to visit her mother yesterday at the independent living, and she noticed her mother was more lethargic and weak. She also got quite winded as she ambulated. Her mother has also had significant weakness and increasing tremors over the past few days. Normally, she is able to hold a cup of coffee in the right hand, but because of the increasing tremor on the right side, she has been spilling it everywhere. She was also noted to have O2 sats saturation in the 60s as mentioned earlier today, and therefore sent to the ER. In the Emergency Room, the patient's O2 sats have remained stable on a couple of liters. Her chest x-ray did show a questionable chronic right lower lobe infiltrate. She continues to have significant weakness and was noted to be in mild acute renal failure. Hospitalist services were contacted for further treatment and evaluation.   Allergies:  Sulfa drugs: Rash  Prednisone: Unknown  Vioxx: Unknown  Tape: Unknown  Codeine: Other  Benadryl: Unknown  Cipro: Unknown  Magnesium Sulfate: Unknown  chlorpheniramine-hydrocodone:  Unknown    TDMs:  07-Apr-15 09:27   Vancomycin, Trough LAB  6 (Result(s) reported on 10 Apr 2013 at 10:07AM.)  Routine Micro:  05-Apr-15 13:41   Micro Text Report BLOOD CULTURE   COMMENT                   NO GROWTH IN 37 HOURS   ANTIBIOTIC                       Specimen Source #1 left AC  Culture Comment NO GROWTH IN 48 HOURS  Result(s) reported on 10 Apr 2013 at 01:00PM.    13:46   Micro Text Report BLOOD CULTURE   COMMENT                   NO GROWTH IN 80 HOURS   ANTIBIOTIC                       Specimen Source #2 left hand  Culture Comment NO GROWTH IN 48 HOURS  Result(s) reported on 10 Apr 2013 at 01:00PM.  Routine Chem:  04-Apr-15 04:55   Glucose, Serum  163  BUN  27  Creatinine (comp)  1.45  Sodium, Serum 138  Potassium, Serum 4.4  Chloride, Serum 104  CO2, Serum 30  Calcium (Total), Serum  8.1  Anion Gap  4  Osmolality (calc) 284  eGFR (African American)  39  eGFR (Non-African American)  34 (eGFR values <42mL/min/1.73 m2 may be an indication of chronic kidney disease (CKD). Calculated eGFR is  useful in patients with stable renal function. The eGFR calculation will not be reliable in acutely ill patients when serum creatinine is changing rapidly. It is not useful in  patients on dialysis. The eGFR calculation may not be applicable to patients at the low and high extremes of body sizes, pregnant women, and vegetarians.)  05-Apr-15 04:28   Glucose, Serum  113  BUN  22  Creatinine (comp)  1.34  Sodium, Serum 141  Potassium, Serum 4.2  Chloride, Serum 107  CO2, Serum 29  Calcium (Total), Serum  8.3  Anion Gap  5  Osmolality (calc) 285  eGFR (African American)  43  eGFR (Non-African American)  37 (eGFR values <83mL/min/1.73 m2 may be an indication of chronic kidney disease (CKD). Calculated eGFR is useful in patients with stable renal function. The eGFR calculation will not be reliable in acutely ill patients when serum creatinine is changing rapidly. It  is not useful in  patients on dialysis. The eGFR calculation may not be applicable to patients at the low and high extremes of body sizes, pregnant women, and vegetarians.)  Routine Hem:  04-Apr-15 04:55   Hemoglobin (CBC)  9.2  WBC (CBC) 9.1  RBC (CBC)  3.12  Hematocrit (CBC)  27.5  Platelet Count (CBC) 316  MCV 88  MCH 29.5  MCHC 33.5  RDW  16.7  Neutrophil % 81.7  Lymphocyte % 5.3  Monocyte % 11.2  Eosinophil % 1.3  Basophil % 0.5  Neutrophil #  7.4  Lymphocyte #  0.5  Monocyte #  1.0  Eosinophil # 0.1  Basophil # 0.0 (Result(s) reported on 07 Apr 2013 at 05:35AM.)  07-Apr-15 06:08   Hemoglobin (CBC)  9.5 (Result(s) reported on 10 Apr 2013 at 06:23AM.)   Pertinent Past History:  Pertinent Past History COPD, oxygen dependent; history of cryoglobulinemia, polymyalgia rheumatica, neuropathy, hypothyroidism, depression, carotid artery stenosis.   Hospital Course:  Hospital Course 81 yo female w/ hx of COPD on chronic O2, hx of cryoglubulinemia, PMR, Neuropathy,  Hypothyroidism, Depression came into hospital due to weakness, AMS and also noted to have low O2 sats in 60's at home.   * Acute on chronic respiratory failure due to RLL PNA On abx. Cx negative. Improving.  * Acute encephalopathy Improving. Due to Pneumonia and meds. Likely early Dementia. neurology consulted with her new tremors for input.  Will need f/u at [facility] for tremors. Decreased neurontin dose.  * ARF over CKD resolved  * COPD - no acute exacerbation  - cont. Advair, Spiriva  * Anemia-Worsening over past 4 months. Blood work suggests mixed iron deficiency and AOCD. Will need OP colonoscopy.  * Hypothyroidism - cont. synthroid.  TSH normal  *  HTN - Hold ACE - cont. Norvasc.   * Depression - cont. Zoloft.  Today LUNGS-CTA CVS- S1, S2, No edema Alert oriented times 3  Time spent on D/C today 40 min   Condition on Discharge Fair   Code Status:  Code Status Full Code    DISCHARGE INSTRUCTIONS HOME MEDS:  Medication Reconciliation: Patient's Home Medications at Discharge:     Medication Instructions  levothyroxine 25 mcg (0.025 mg) oral tablet  1 tab(s) orally once a day (in the morning)   nitroglycerin 0.4 mg sublingual tablet  1 tab(s) sublingual every 5 minutes up to 3 doses as needed for chest pain. *if no relief call MD or go to emergency room*   amlodipine 5 mg oral tablet  1 tab(s) orally once a day (  in the morning)   aspirin 81 mg oral tablet  1 tab(s) orally once a day (in the morning)   clopidogrel 75 mg oral tablet  1 tab(s) orally once a day (in the morning)   sertraline 100 mg oral tablet  1 tab(s) orally once a day (in the morning)   atorvastatin 40 mg oral tablet  1 tab(s) orally once a day (in the evening)   ranitidine 300 mg oral tablet  1 tab(s) orally once a day (in the evening)   trazodone 50 mg oral tablet  1 tab(s) orally once a day (in the evening)   spiriva 18 mcg inhalation capsule  1 cap(s) via handihaler once a day (in the morning)   advair diskus 250 mcg-50 mcg inhalation powder  1 puff(s) inhaled 1 to 2 times a day   ventolin cfc free 90 mcg/inh inhalation aerosol  2 puff(s) inhaled 4 times a day, As Needed - for Shortness of Breath   guaifenesin 600 mg oral tablet, extended release  1 tab(s) orally every 12 hours, As Needed   gabapentin 400 mg oral capsule  1 cap(s) orally 2 times a day   azithromycin 250 mg oral tablet  1 tab(s) orally once a day   polyethylene glycol 3350 oral powder for reconstitution  17 gram(s) orally once a day, As needed, constipation   amoxicillin-clavulanate 875 mg-125 mg oral tablet  875 milligram(s) orally 2 times a day   duoneb 0.5 mg-2.5 mg/3 ml inhalation solution  3 milliliter(s) inhaled 4 times a day, As Needed - for Shortness of Breath    STOP TAKING THE FOLLOWING MEDICATION(S):    zofran 4 mg oral tablet: 1 tab(s) orally every 8 hours, As Needed for nausea or vomiting. lisinopril 10 mg  oral tablet: 2 tabs (25m) orally once a day (in the morning) oxygen: oxygen @ 2 liter(s) via nasal cannula continuously  Physician's Instructions:  Home Oxygen? Yes   Oxygen delivery at home: 2L  3L  Nasal Cannula  2-3 liters   Diet Low Sodium   Activity Limitations As tolerated  with walker with assistance   Return to Work Not Applicable   Time frame for Follow Up Appointment 1-2 weeks  Has appt with Dr. HHilda Blades  Time frame for Follow Up Appointment 2-4 weeks  [facility]- For tremors   Time frame for Follow Up Appointment 2-4 weeks  [facility].   Electronic Signatures: Alexiya Franqui, SLottie Dawson(MD)  (Signed 07-Apr-15 13:46)  Authored: ADMISSION DATE AND DIAGNOSIS, CHIEF COMPLAINT/HPI, Allergies, PERTINENT LABS, PERTINENT PAST HISTORY, HOSPITAL COURSE, DISCHARGE INSTRUCTIONS HOME MEDS, PATIENT INSTRUCTIONS   Last Updated: 07-Apr-15 13:46 by SAlba Destine(MD)

## 2014-04-27 NOTE — Consult Note (Signed)
PATIENT NAME:  Joy [name], Joy [name] MR#:  [id number] DATE OF BIRTH:  [birth year]  DATE OF CONSULTATION:  04/08/2013  REFERRING PHYSICIAN:   CONSULTING PHYSICIAN:  Leotis Pain, MD  REASON FOR EVALUATION:  Tremor and altered mental status.   HISTORY OF PRESENT ILLNESS: This is an 81 year old female presenting from [facility] residential home for questionable altered mental status. The patient was brought to the hospital, found to be lethargic, O2 sats into the 60s. Found to have increased tremor. On initial evaluation and x-ray, the patient was found to have a right lower lobe infiltrate, started on antibiotics. Throughout the course, mental status significantly improved and the patient is close to her baseline. When examining the tremor, patient's tremor appears to be mild postural rather than resting tremor.   PHYSICAL EXAMINATION:  Alert, awake, oriented to time, place and her name. Speech appears to be fluent. No dysarthria or aphasia noted. Cranial nerve examination: Extraocular movements intact. Pupils 4 mm to 2 mm, reactive bilaterally. Facial sensation intact. Facial motor is intact. Tongue is midline. Uvula elevates symmetrically. Motor appears to be 5 out of 5 bilateral upper and lower extremities. When examining the tremor, the patient has a fine tremor, specifically on postural. When the patient raises her hands in front of her head, the tremor is exacerbated but the frequency comes and goes. Coordination: Finger-to-nose intact but tremulous. Limited resting tremor has been noted.   IMPRESSION: An 82 year old female with a long-term history of tremor, family history of Parkinson's disease, who presents with worsening tremor, altered mental status, confusion suspected to be pneumonia. The patient has been on antibiotics. Mental status significantly improved. The patient is close to baseline. I do not believe this tremor appears to be parkinsonian in nature. This is more of a postural tremor,  questionable essential tremor.   RECOMMENDATIONS:  I would not start her on any antiparkinsonian medication at this time specifically since the tremor has significantly improved since admission. I would like this patient to be followed up with [place] neurology, specifically movement disorder clinic. This case was discussed with patient and patient's husband at bedside. Yesterday, the patient's care was discussed with the patient's daughter bedside.   Thank you, it was a pleasure seeing this patient.   ____________________________ Leotis Pain, MD yz:cs D: 04/08/2013 14:33:52 ET T: 04/08/2013 15:25:36 ET JOB#: [id number]  cc: Leotis Pain, MD, <Dictator> Leotis Pain MD ELECTRONICALLY SIGNED 05/02/2013 11:29

## 2014-04-27 NOTE — H&P (Signed)
PATIENT NAME:  Joy [name], Joy [name] MR#:  [id number] DATE OF BIRTH:  [birth year]  DATE OF ADMISSION:  01/25/2013  PRIMARY CARE PHYSICIAN: Dr. Arnette Norris.    REFERRING PHYSICIAN: Dr. Cinda Quest.   CHIEF COMPLAINT: Chest pain.   HISTORY OF PRESENT ILLNESS: Joy [name] is an 81 year old female with a past medical history of hypertension, hyperlipidemia, coronary artery disease status post stent placement, CPA, polymyalgia rheumatica, presented to the Emergency Department with complaints of chest pain on the left side of the chest. Started around 5, 5:30  in the evening while at rest. The pain is sharp in nature, it last few a few seconds to a few minutes. There was no associated no radiation. No exacerbating and relieving factors. Denies having any nausea, vomiting, abdominal pain, diaphoresis, lightheadedness, palpitations. Concerning this, EMS was called and was brought to the Emergency Department. Workup in the Emergency Department with EKG and cardiac enzymes were unremarkable. The patient was tachycardic with a heart rate in the 40s to 50s. The patient is on metoprolol.  Emergency Department physician discussed this with Dr. Fletcher Anon who recommended the patient to be admitted for further rule out with cardiac enzymes.   PAST MEDICAL HISTORY: 1. Chronic obstructive pulmonary disease, oxygen dependent on 2 liters.  2. Mitochondrial encephalopathy, lactic acidosis and strokelike episodes, diagnosed in 2002 at [facility].   3.  Polymyalgia rheumatica.  4.  Hypothyroidism.  5.  Cerebrovascular accident.  6.  Coronary artery disease, status post stent placement in between 2002 and 2005.  7.  Depression and anxiety.  8.  Obesity.  9.  Hyperlipidemia.  10.  Peripheral neuropathy.  11.  Vascular dementia.   PAST SURGICAL HISTORY: 1.  Cholecystectomy.  2.  C-section x 3.  3.  Right cataract.  4.  Carotid endarterectomy on the left, complicated with CVA.  5.  Status post three stents placement.   ALLERGIES:   1.  CODEINE.  2.  PREDNISONE. 3.  SULFA DRUGS.   4.  VIOXX. 5.  ENTEX. 6.  TAPE. 7.  CHLORPROMAZINE/HYDROCODONE.    HOME MEDICATIONS: 1.  Trazodone 50 mg once a day.  2.  Spiriva 18 mcg once a day.  3.  Sertraline 50 mg 2 tablets once a day.  4.  150 mg once a day.  5.  Nitroglycerin 0.4 mg every five minutes as needed.  6.  Metoprolol 12.5 mg once a day.  7.  Lisinopril 20 mg once a day.  8.  Levothyroxine 25 mcg once a day.  9.  Gabapentin 800 mg once a day.  10.  Plavix 75 mg once a day.  11.  Clotrimazole to affected area.   12.  Atorvastatin 40 mg once a day.  13.  Aspirin 81 mg once a day.  14.  Amlodipine 5 mg once a day.  15.  Albuterol 2 puffs inhalation 4 times a day.  16.  Advair Diskus 1 puff every 12 months.   SOCIAL HISTORY: Smoked in the past; quit about 20 years back. Denies drinking alcohol or using, lives by herself in an apartment.   FAMILY HISTORY: Positive for MELAS in her son who died from the complications of this. Mother died from a stroke in her 58s.   REVIEW OF SYSTEMS: CONSTITUTIONAL: Denies any generalized weakness.  EYES: No change in vision.  ENT: No change in hearing. No sore throat.  RESPIRATORY: No cough, shortness of breath.  CARDIOVASCULAR: Has chest pain.  GASTROINTESTINAL: No nausea, vomiting, abdominal pain.  GENITOURINARY: No dysuria  or hematuria.  ENDOCRINE: No polyuria or polydipsia. HEMATOLOGIC:  No easy bruising or bleeding.  SKIN: No rash or lesions.  MUSCULOSKELETAL: No joint pains and aches.  NEUROLOGIC: No weakness or numbness in any part of the body.   PHYSICAL EXAMINATION: GENERAL: This is a well-developed, age-appropriate female lying down in the bed, not in distress.  VITAL SIGNS: Temperature 98.1, pulse 48, blood pressure 140/53, respiration of 18, oxygen saturation is 96% on room air.  HEENT: Head normocephalic, atraumatic. No scleral icterus. Conjunctivae normal. Pupils equal and react to light. Extraocular  movements are intact. Mucous membranes moist. No pharyngeal erythema.  NECK: Supple. No lymphadenopathy. No JVD.  CHEST: Has palpable tenderness on the left side of the chest.   LUNGS: Bilaterally clear to auscultation. HEART: S1, S2 regular. No murmurs are heard.  ABDOMEN: Bowel sounds present. Soft, nontender, nondistended. No hepatosplenomegaly.  EXTREMITIES: No pedal edema. Pulses 2+.  NEUROLOGIC: The patient is alert, oriented to place, person and time. Cranial nerves II through XII intact. Motor 5/5 in upper and lower extremities.  SKIN: No rashes or lesions.  MUSCULOSKELETAL: Good range of motion in all the extremities.   LABORATORY DATA: BMP is completely within normal limits.   CBC is completely within normal limits.   Chest x-ray, one view portable: Possible tiny amount of fluid around the right, infarct, but troponin less than 0.02.   EKG, 12-lead:, totally normal, sinus bradycardia.   ASSESSMENT AND PLAN: Joy [name] is an 81 year old female who comes to the Emergency Department with complaints of chest pain.  1.  Chest pain. This is a musculoskeletal pain. However, considering the patient's history of coronary artery disease, admit the patient on observation. Rule out with cardiac enzymes x 3. The patient can follow up as an outpatient for a stress test.  2.   Hypertension: Currently well controlled. Continue with home medications.  3.   Sinus bradycardia from the metoprolol. Hold the metoprolol for now.  4.  History of chronic obstructive pulmonary disease. No current issues, continue as needed breathing treatments.  5.   Keep the patient on deep vein thrombosis prophylaxis with Lovenox.   TIME SPENT: 45 minutes.    ____________________________ Monica Becton, MD pv:NTS D: 01/25/2013 [id number] ET T: 01/26/2013 00:15:08 ET JOB#: [id number]  cc: Monica Becton, MD, <Dictator> Monica Becton MD ELECTRONICALLY SIGNED 01/28/2013 21:23

## 2014-04-27 NOTE — Consult Note (Signed)
General Aspect [name] is a 81yo female w/ PMHx s/f vascular dementia, CAD, h/o CVA, PVD- carotid artery dz s/p stenting, peripheral artery stent- HTN, HLD, COPD (on O2 at home), h/o pulmonary nodule, PMR, peripheral neuropathy, depression and anxiety who was admitted to [facility] yesterday for chest pain. Cardiology consulted chest pain.   She reports underoing PCI between 2002 and 2005 at [facility]. This was in the setting of a MI. Details surrounding this are unclear due to dementia. She was seen in 2011 by Dr. Ubaldo Glassing for chest pain. 2D echo showed preserved EF (>55%), no significant structural/valvular disease (mild LAE, mild MR). Noted to be bradycardic at that time, improved off beta blockers.  She underwent a Lexiscan Sestamibi study which revealed normal LV function with no evidence of ischemia. She has not had cardiology follow-up at [facility] in "years."  She reports not feeling well all day yesterday. She reside at [facility] with her husband. He notices she was not as energetic. Their aide who arrives at [facility] each day agreed. They finished dinner around 5:30 PM and shortly after, she developed severe 9/10 L sided, sharp chest pain w/o radiation. She took NTG SL x 1 w/ some relief. No relation to exertion. Not remniscent of aninga. Aggravated by palpation and deep inspiration. She has chronic dyspnea and wears home O2. There has been steadily progressive DOE per the daughter. No LE edema, PND, orthopnea or weight gain. The acuity and severity of the discomfort concerned her, and EMS was subsequently called. She was transported to [facility] ED.   Present Illness In the ED, EKG revealed no ischemic changes. Initial TnI WNL. CBC and BMP were largely WNL. CXR- possible tiny amount of fluid along 1 of the fissures at the right base versus an artifact. BP elevated in the ED (SBP 170-180). She was admitted for overnight rule out. One subsequent troponin has returned WNL.   Bradycardic overnight, HR 30-40s, sinus. BP  208/70 this AM. Outpatient medications still show metoprolol. Also on ASA/Plavix/lisinopril/atorvastatin/NTG SL PRN.  CT-A ordered and pending.   PAST MEDICAL HISTORY: 1. Chronic obstructive pulmonary disease, oxygen dependent on 2 liters.  2. Mitochondrial encephalopathy, lactic acidosis and strokelike episodes, diagnosed in 2002 at [facility].   3.  Polymyalgia rheumatica.  4.  Hypothyroidism.  5.  Cerebrovascular accident.  6.  Coronary artery disease, status post stent placement in between 2002 and 2005.  7.  Depression and anxiety.  8.  Obesity.  9.  Hyperlipidemia.  10.  Peripheral neuropathy.  11.  Vascular dementia.   PAST SURGICAL HISTORY: 1.  Cholecystectomy.  2.  C-section x 3.  3.  Right cataract.  4.  Carotid endarterectomy on the left, complicated with CVA.  5.  Status post three stents placement.   ALLERGIES:  1.  CODEINE.  2.  PREDNISONE. 3.  SULFA DRUGS.   4.  VIOXX. 5.  ENTEX. 6.  TAPE. 7.  CHLORPROMAZINE/HYDROCODONE.    SOCIAL HISTORY: Smoked in the past; quit about 20 years back. Denies drinking alcohol or using, lives by herself in an apartment.   FAMILY HISTORY: Positive for MELAS in her son who died from the complications of this. Mother died from a stroke in her 52s.   Physical Exam:  GEN no acute distress, obese   HEENT pink conjunctivae, PERRL, hearing intact to voice   NECK supple  No masses  trachea midline  no JVD or bruits   RESP normal resp effort  clear BS  no use  of accessory muscles   CARD Regular rate and rhythm  Normal, S1, S2  No murmur  L chest wall pain on palpation   ABD denies tenderness  soft  normal BS   EXTR negative cyanosis/clubbing, negative edema   SKIN normal to palpation, No rashes   NEURO follows commands, motor/sensory function intact   PSYCH alert, A+O to time, place, person   Review of Systems:  Subjective/Chief Complaint chest pain   Respiratory: Short of breath   Cardiovascular: Chest pain or discomfort    Review of Systems: All other systems were reviewed and found to be negative   Home Medications: Medication Instructions Status  amLODIPine 5 mg oral tablet 1 tab(s) orally once a day Active  Spiriva 18 mcg inhalation capsule 1 each inhaled once a day Active  clopidogrel 75 mg oral tablet 1 tab(s) orally once a day Active  albuterol CFC free 90 mcg/inh inhalation aerosol 2 puff(s) inhaled 4 times a day, As Needed - for Shortness of Breath Active  aspirin 81 mg oral tablet 1 tab(s) orally once a day Active  atorvastatin 40 mg oral tablet 1 tab(s) orally once a day (at bedtime) Active  Advair Diskus 250 mcg-50 mcg inhalation powder 1 puff(s) inhaled every 12 hours Active  levothyroxine 25 mcg (0.025 mg) oral tablet 1 tab(s) orally once a day (in the morning) Active  lisinopril 20 mg oral tablet 1 tab(s) orally once a day Active  metoprolol tartrate 25 mg oral tablet 0.5 tab(s) orally once a day (at bedtime) Active  nitroglycerin 0.4 mg sublingual tablet 1 tab(s) sublingual every 5 minutes, As Needed - for Chest Pain Active  ranitidine 150 mg oral tablet 1 tab(s) orally once a day (at bedtime) Active  sertraline 50 mg oral tablet 2 tab(s) orally once a day Active  traZODone 100 mg oral tablet 0.5-1 tab(s) orally once a day (at bedtime) Active  clobetasol topical 0.05% topical ointment Apply topically to affected area 2 times a day Active  gabapentin 800 mg oral tablet 1 tab(s) orally once a day (in the morning) and 2 tablets at bedtime Active   Lab Results:  Cardiac:  22-Jan-15 18:52   CPK-MB, Serum 0.7 (Result(s) reported on 26 Jan 2013 at 12:09AM.)  Troponin I < 0.02 (0.00-0.05 0.05 ng/mL or less: NEGATIVE  Repeat testing in 3-6 hrs  if clinically indicated. >0.05 ng/mL: POTENTIAL  MYOCARDIAL INJURY. Repeat  testing in 3-6 hrs if  clinically indicated. NOTE: An increase or decrease  of 30% or more on serial  testing suggests a  clinically important change)  CK, Total 25     20:55   CPK-MB, Serum  < 0.5 (Result(s) reported on 26 Jan 2013 at 12:09AM.)  Troponin I < 0.02 (0.00-0.05 0.05 ng/mL or less: NEGATIVE  Repeat testing in 3-6 hrs  if clinically indicated. >0.05 ng/mL: POTENTIAL  MYOCARDIAL INJURY. Repeat  testing in 3-6 hrs if  clinically indicated. NOTE: An increase or decrease  of 30% or more on serial  testing suggests a  clinically important change)   EKG:  Interpretation NSR, no ST/T changes, small Q waves II, III, aVF   Rate 65   EKG Comparision Not changed from  08/2012 tracing   Radiology Results: XRay:    22-Jan-15 19:00, Chest Portable Single View  Chest Portable Single View   REASON FOR EXAM:    chest pain  COMMENTS:       PROCEDURE: DXR - DXR PORTABLE CHEST SINGLE VIEW  - Jan 25 2013  7:00PM     CLINICAL DATA:  Chest pain.    EXAM:  PORTABLE CHEST - 1 VIEW    COMPARISON:  08/28/2012    FINDINGS:  Heart size and pulmonaryvascularity are normal. Linear density at  the right lung base laterally may represent some fluid along the  fissures. Otherwise normal. No acute osseous abnormality.     IMPRESSION:  Possible tiny amount of fluid along 1 of the fissures at the right  base versus an artifact.      Electronically Signed    By: Rozetta Nunnery M.D.    On: 01/25/2013 19:16         Verified By: Larey Seat, M.D.,    Sulfa drugs: Rash  Prednisone: Unknown  Vioxx: Unknown  Tape: Unknown  Codeine: Other  Benadryl: Unknown  Cipro: Unknown  Magnesium Sulfate: Unknown  chlorpheniramine-hydrocodone: Unknown  Vital Signs/Nurse's Notes: **Vital Signs.:   23-Jan-15 08:10  Vital Signs Type Routine  Temperature Temperature (F) 97.9  Celsius 36.6  Pulse Pulse 59  Respirations Respirations 16  Systolic BP Systolic BP 123XX123  Diastolic BP (mmHg) Diastolic BP (mmHg) 70  Mean BP 116  Pulse Ox % Pulse Ox % 96  Pulse Ox Activity Level  At rest  Oxygen Delivery 2L    Impression 81yo female w/ PMHx s/f vascular  dementia, CAD, h/o CVA, PVD- carotid artery dz s/p stenting, peripheral artery stent- HTN, HLD, COPD (on O2 at home), h/o pulmonary nodule, PMR, peripheral neuropathy, depression and anxiety who was admitted to [facility] yesterday for chest pain. Cardiology consulted chest pain.   1. Atypical chest pain The chest discomfort described is mostly atypical- sharp, L sided chest pain aggravated by palpation and deep inspiration. No exertional component. No associated diaphoresis or nausea. She cannot relate this to her prior MI due to memory deficits. It did respond to NTG SL. Objectively, EKG indicates no evidence of ischemia. TnI x 2 WNL. Chest pain has resolved. On exam, the discomfort can be reproduced on L chest wall palpation. Myoview in 2011 indicated preserved EF, no WMAs, no evidence of ischemia.  -- Suspect inflammatory etiology, however given her cardiac history and RFs, would favor updating ischemic evaluation with pharmacologic stress test. This could be performed as an outpatient, but will confer w/ MD. She wishes to follow-up with [place]. She has not followed with [facility] for cardiac care in some time.  -- Review CT-A findings -- Continue ASA, Plavix, ACEi, high potency statin, NTG SL PRN -- If CT-A unrevealing, supportive care for costocondritis/pleuritis  2. CAD s/p prior PCI Low suspicion for ACS.  -- As above, continue cardiac meds -- Hold BB due to bradycardia  3. HTN Markedly elevated this AM before receiving antihypertensives.  -- Agree with amlodipine, especially given marked SBP elevation. Could up-titrate to 10mg  if further control needed. Continue ACEi, avoid AVN blockers.   4. HLD -- Continue atorvastatin 40  5. Sinus bradycardia HR 30-40s overnight on telemetry. The patient does snore per daughter, but has undergone sleep studies in the past revealing no significant sleep apnea. Asymptomatic with this. Rates improved to 50-70s while awake.  -- Avoid AVN blockers   Electronic  Signatures for Addendum Section:  Kathlyn Sacramento (MD) (Signed Addendum 23-Jan-15 13:35)  The patient was seen and examined. Agree with the above. Known history of CAD with atypical chest pain. NO murmurs by exam. Ruled out for MI. Agree with outpatient stress test. Will arrange.   Electronic Signatures: Ammara Raj,  Otilio Groleau A (PA-C)  (Signed 23-Jan-15 11:55)  Authored: General Aspect/Present Illness, History and Physical Exam, Review of System, Home Medications, Labs, EKG , Radiology, Allergies, Vital Signs/Nurse's Notes, Impression/Plan Kathlyn Sacramento (MD)  (Signed 23-Jan-15 13:35)  Co-Signer: General Aspect/Present Illness, History and Physical Exam, Review of System, Home Medications, Labs, EKG , Radiology, Allergies, Vital Signs/Nurse's Notes, Impression/Plan   Last Updated: 23-Jan-15 13:35 by Kathlyn Sacramento (MD)

## 2014-04-27 NOTE — Discharge Summary (Signed)
PATIENT NAME:  Joy [name], Joy [name] MR#:  [id number] DATE OF BIRTH:  November 02, 1930  DATE OF ADMISSION:  01/25/2013 DATE OF DISCHARGE:  01/26/2013  DISCHARGE DIAGNOSIS:  Atypical chest pain, likely musculoskeletal. Negative serial cardiac enzymes. recommend outpatient cardiology evaluation.   SECONDARY DIAGNOSES: 1.  Chronic obstructive pulmonary disease on chronic 2 liters oxygen.  2.  Mitochondrial encephalopathy with lactic acidosis.  3.  Polymyalgia rheumatica.  4.  Hypothyroidism.  5.  History of cerebrovascular accident.  6.  Coronary artery disease, status post stenting.  7.  Anxiety and depression.  8.  Obesity.  9.  Hyperlipidemia.  10.  Peripheral neuropathy.  11.  Vascular dementia.   CONSULTATIONS:  Cardiology, Dr. Fletcher Anon.   PROCEDURES AND RADIOLOGY: Chest x-ray on the January 22,  showed no acute cardiopulmonary disease.   CT scan of the chest on the January 23, showed no evidence of PE. Possible mucous plugging in the right middle lobe. A 3 mm nodule on the right major fissure, likely representing subpleural lymph node. Borderline cardiomegaly without failure. Extensive atherosclerotic coronary artery calcification, small simple cyst the spleen.   HISTORY AND SHORT HOSPITAL COURSE: The patient is an 81 year old female with above mentioned medical problems who was admitted for chest pain. Please see Dr. Ward Givens dictated history and physical for further details. The patient was ruled out with  3 negative sets of troponin. Cardiology consultation was obtained with Dr. Fletcher Anon, who felt this pain to be atypical and recommended outpatient cardiology follow-up and possible stress test. Considering the patient's  limited ambulation,  CT of the chest was obtained as her pain was that time concerning for possible PE, but a CT scan of the chest was negative and after discussion with the patient's daughter and as pain was resolved, the patient was discharged home in stable condition. On January  23, on the date of discharge, her vital signs are as follows: Temperature 97.9, heart rate 61 per minute, respirations 16 per minute, blood pressure 135-75 mmHg. She was saturating 96% on 2 liters oxygen via nasal cannula which is her baseline oxygen requirement.   PERTINENT PHYSICAL EXAMINATION ON THE DATE OF DISCHARGE: CARDIOVASCULAR: S1, S2 normal. No murmurs or gallop.  LUNGS: Clear to auscultation bilaterally. No wheezing, rales, rhonchi, or crepitation.  ABDOMEN: Soft, benign.  NEUROLOGIC: Nonfocal examination. All other physical examination remained at baseline.   DISCHARGE MEDICATIONS: 1. Gabapentin 800 mg p.o. 1 tablet every morning and 2 tablets at bedtime.  2.  Amlodipine 5 mg p.o. daily.  3.  Spiriva once daily.  4.  Plavix 75 mg p.o. daily.  5.  Albuterol 2 puffs inhaled 4 times a day as needed.  6.  Aspirin 81 mg p.o. daily.  7.  Lipitor  40 mg p.o. at bedtime.  8.  Advair 250/50 one puff inhaled twice a day.  9.  Levothyroxine 25 mcg p.o. daily.  10.  Lisinopril 20 mg p.o. daily.  11.  Metoprolol 25 mg 1/2 tablet p.o. at bedtime.  12.  Nitroglycerin 0.4 mg sublingual every five minutes as needed.  13.  Ranitidine 150 mg p.o. at bedtime.  14.  Sertraline 50 mg 2 tablets p.o. daily.  15.  Trazodone 100 mg up to 1 tablet p.o. at bedtime. 16.  Clobetasol 0.05% topical to affected area twice a day.   DISCHARGE DIET: Low sodium.   DISCHARGE ACTIVITY: As tolerated.   DISCHARGE INSTRUCTIONS AND FOLLOW-UP: The patient was instructed to follow up with her primary care physician, Dr. Arnette Norris  in 2 to 4 weeks with Dr. Kathlyn Sacramento in 1 to 2 weeks. She was requested to continue her oxygen continuously at home, which is her chronic baseline oxygen requirement.   TOTAL TIME DISCHARGING THIS PATIENT: 45 minutes.    ____________________________ Lucina Mellow. Manuella Ghazi, MD vss:cc D: 01/28/2013 16:14:40 ET T: 01/28/2013 21:15:31 ET JOB#: [id number]  cc: [name] S. Manuella Ghazi, MD,  <Dictator> Marciano Sequin. Deborra Medina, MD Mertie Clause. Fletcher Anon, [place] MD ELECTRONICALLY SIGNED 01/30/2013 13:23

## 2014-04-28 NOTE — Discharge Summary (Signed)
PATIENT NAME:  Joy [name], Joy [name] MR#:  [id number] DATE OF BIRTH:  18-Jan-1930  DATE OF ADMISSION:  02/05/2011 DATE OF DISCHARGE:  02/10/2011  ADMITTING DIAGNOSIS: Shortness of breath.   DISCHARGE DIAGNOSES:  1. Shortness of breath due to acute chronic obstructive pulmonary disease exacerbation.  2. Acute on chronic respiratory failure.  3. Possible bibasilar early pneumonia status post treatment with intravenous Rocephin and azithromycin, she will be on p.o. antibiotics.  4. Hypertension.  5. Peripheral neuropathy.  6. Hypothyroidism.  7. Memory loss due to possible vascular dementia.  8. History of MELAS (mitochondrial encephalomyopathy, lactic acidosis, and stroke-like episodes) diagnosis.  9. History of coronary artery disease.  10. Depression.  11. Morbid obesity.  12. Status post cesarean section x3.  13. Status post cholecystectomy.  14. Status post left carotid endarterectomy.   PERTINENT LABS AND EVALUATIONS: Chest x-ray showed mild bilateral interstitial thickening and periorbital cuffing with likely lower airway disease secondary to infectious or inflammatory etiology.   Cardiac enzymes: Troponin was less than 0.02. BMP: Glucose 80, BUN 18, creatinine 1.0, sodium 144, potassium 4.4, chloride 105, CO2 31. LFTs were normal, except total protein of 6.1 and albumin of 3.2. BNP was elevated at 1016.  CT scan of the head showed no acute intracranial processes.   ABG showed a pH of 7.36, pCO2 52, and PaO2 74.   Urine culture: No growth at 36 hours.  CT per PE protocol showed the appearance of pulmonary parenchyma as well as cardiac chambers suggest possible congestive heart failure, minimal atelectasis in the posterior aspect of the right lower lobe. There is evidence of chronic obstructive pulmonary disease. No PE.  2-D echocardiogram showed left ventricular systolic function normal, left atrial size normal, right ventricular systolic pressure elevated at 30 to 40  mmHg.  CONSULTANTS: None.   HOSPITAL COURSE: Please see the History and Physical done by the admitting physician. The patient is a 81 year old white female with history of chronic obstructive pulmonary disease who presented with the complaint of shortness of breath and cough. The patient was noted to be hypoxic with oxygen saturation in the 80s. Apparently the patient has had shortness of breath with minimal exertion, even prior to this episode. Her chest x-ray suggested possible early pneumonia. The patient also was having wheezing on presentation. She was admitted for acute chronic obstructive pulmonary disease exacerbation and was treated with nebulizers. Unfortunately, she stated that she is allergic to steroids and could not take these. She was placed on antibiotics for the pneumonia. Also, her BNP on presentation was elevated. However, after giving IV Lasix her creatinine did increase; therefore, her Lasix was discontinued. Her echocardiogram showed a preserved ejection fraction. No diastolic dysfunction was noted. The patient likely has chronic hypoxia and will require chronic home oxygen. She was also noted to have slightly elevated pulmonary pressures. She reports that she had a sleep study done 1 to 2 years ago, which was negative for sleep apnea. So her elevated pulmonary artery pressure is likely due to underlying chronic obstructive pulmonary disease. At this time, the patient is being discharged to a skilled nursing facility. She is stable for discharge.   DISCHARGE MEDICATIONS:  1. Plavix 75 mg p.o. daily.  2. Zofran 4 mg every 6 hours p.r.n.  3. Spiriva inhaler 1 inhalation daily.  4. Levothyroxine 0.25 mg daily.  5. Lisinopril 20 mg daily.  6. Gabapentin 800 mg every a.m., 600 mg at bedtime.  7. Lopressor 12.5 mg daily.  8. Zoloft 50 mg at  bedtime.  9. Trazodone 50 mg at bedtime.  10. Ranitidine 300 mg at bedtime.  11. Azithromycin 250 mg daily x3 days.  12. Mucinex 600 mg p.o.  twice a day x5 days. 13. Advair 250/50 INH twice a day. 14. Tessalon Perles 100 mg every 6 hours p.r.n. cough.  15. Ceftin 500 mg p.o. every 12 hours x3 days.  16. Combivent metered-dose inhaler 2 to 4 puffs every 6 hours p.r.n.  17. Albuterol Atrovent nebulizers every 6 hours p.r.n. shortness of breath or wheezing.  18. Aspirin 30 mg daily.  19. Tylenol 650 mg every 4 hours p.r.n. pain. 20. Atorvastatin 40 mg daily. 21. Colace 100 mg p.o. twice a day. 22. Senna 1 to 2 tabs p.r.n. constipation.   ACTIVITY: As tolerated.  OXYGEN: 3 liters at all times.   DIET:  4 gram sodium diet.   ACTIVITY: As tolerated. Evaluation with physical therapy and occupational therapy.  DISCHARGE FOLLOWUP: Followup with Dr. Arnette Norris in 7 to 10 days. Followup with [facility] in 3 to 4 weeks. Check oxygen saturations in 1 week with ambulation. If the patient does not need oxygen then this can be discontinued.   TIME SPENT: 35 minutes.  ____________________________ Lafonda Mosses Posey Pronto, MD shp:slb D: 02/10/2011 12:47:44 ET T: 02/10/2011 13:07:31 ET JOB#: [id number]  cc: Coren Crownover H. Posey Pronto, MD, <Dictator> Marciano Sequin. Deborra Medina, MD Alric Seton MD ELECTRONICALLY SIGNED 02/20/2011 10:01

## 2014-04-28 NOTE — Discharge Summary (Signed)
PATIENT NAME:  Joy [name], Joy [name] MR#:  [id number] DATE OF BIRTH:  Jun 06, 1930  DATE OF ADMISSION:  06/03/2011 DATE OF DISCHARGE:  06/04/2011  For a detailed note, please take a look at the history and physical done on admission by Dr. Inez Catalina.   DIAGNOSES AT DISCHARGE:  1. Acute respiratory failure likely secondary to chronic obstructive pulmonary disease and underlying emphysema, but no acute exacerbation.  2. Hypertension. 3. Depression. 4. History of coronary artery disease. 5. Hypothyroidism.   DIET: Patient is being discharged on a low sodium diet.   ACTIVITY: As tolerated.   FOLLOW UP: Follow up with Dr. Devona Konig and Dr. Arnette Norris in the next 1 to 2 weeks.   DISCHARGE MEDICATIONS:   1. Plavix 75 mg daily.  2. Ranitidine 300 mg b.i.d.  3. Gabapentin 800 mg 1 tab in the morning, 1 tab at noon and 1 tab in the evening.  4. Aspirin 81 mg daily.  5. Zoloft 100 mg daily.  6. Lisinopril 20 mg daily.  7. Trazodone 50 mg daily.  8. Synthroid 25 mcg daily.  9. Spiriva 1 puff daily.  10. 2 liters oxygen nasal cannula.   CONSULTANT DURING THE HOSPITAL COURSE: Dr. Devona Konig from pulmonary.   LABORATORY, DIAGNOSTIC AND RADIOLOGICAL DATA: Pertinent studies done during the hospital course: Chest x-ray done on admission showing bilateral diffuse interstitial thickening, likely representing chronic interstitial disease but superimposed mild interstitial edema versus interstitial pneumonitis cannot be excluded. Patient underwent a high resolution CT scan of chest without contrast which showed moderate central lobular emphysema, multiple subcentimeter upon pulmonary nodules. A follow up noncontrast CT chest is recommended in six months.   HOSPITAL COURSE: This is a 81 year old female with medical problems as mentioned above presented to the hospital secondary to weakness and shortness of breath.  1. Acute on chronic hypoxic respiratory failure. Patient presented to the hospital with some  mild hypoxia. Was admitted to the hospital, started on IV Levaquin. She had been treated in the past similarly for a similar presentation with ceftriaxone and Zithromax with not much improvement therefore was started on Levaquin, although she had no fevers, normal white cell count and no sputum production. Her chest x-ray did show some interstitial infiltrates, which was suspicious for some interstitial lung disease. A pulmonary consult was obtained. Patient was seen by Dr. Devona Konig. Patient underwent a high resolution CT chest which showed no evidence of interstitial lung disease but lateral central lobular emphysema. Patient was ambulated on room air and desatted to the mid 80s therefore did qualify for home oxygen which is being arranged for her. Patient currently is hemodynamically stable, is not complaining of any further hypoxia or chest pain therefore being discharged.  2. Chest pain. This is atypical chest pain, likely musculoskeletal in nature. She was observed on telemetry, had three sets of cardiac markers checked which were negative. This can be further followed as an outpatient.  3. Hypertension. Patient remained hemodynamically stable. Her antihypertensives were actually held as her blood pressure was on the lower side. Initially she was on both metoprolol and lisinopril prior to coming to the hospital. I have discontinued her metoprolol for now, she will just continue with lisinopril for now for her blood pressure.  4. Depression. Patient was maintained on her Zoloft, she will resume that upon discharge.  5. History of coronary artery disease. Patient will continue on her aspirin, Plavix as mentioned. She had three troponins checked which were negative.  6. Hypothyroidism. Patient was maintained  on her Synthroid, she will resume that upon discharge too.   DISPOSITION: Patient is being discharged back to [facility] assisted living facility with home health nursing services as she is being  discharged on oxygen.   CODE STATUS: Patient is a FULL CODE.   TIME SPENT: 40 minutes.  ____________________________ Belia Heman. Verdell Carmine, MD vjs:cms D: 06/04/2011 14:10:19 ET T: 06/04/2011 16:53:40 ET JOB#: [id number]  cc: Belia Heman. Verdell Carmine, MD, <Dictator> Allyne Gee, MD Marciano Sequin. Deborra Medina, MD Henreitta Leber MD ELECTRONICALLY SIGNED 06/14/2011 12:25

## 2014-04-28 NOTE — H&P (Signed)
PATIENT NAME:  Joy [name], Joy [name] MR#:  [id number] DATE OF BIRTH:  July 23, 1930  DATE OF ADMISSION:  06/03/2011  REFERRING PHYSICIAN: Dr. Beather Arbour   PRIMARY CARE PHYSICIAN: Arnette Norris, MD   PRIMARY PULMONOLOGIST: Has not yet established with [facility]    PRESENTING COMPLAINT: Weakness and hypoxia.  HISTORY OF PRESENT ILLNESS: Joy [name] is a 81 year old woman with history of hypothyroidism, CVA, coronary artery disease status post stents, depression, anxiety, COPD not on oxygen who represents with complaints of developing worsening fatigue and hypoxia. The patient was admitted 02/01 to 02/10/2011 in the setting of acute on chronic respiratory failure and presumed bibasilar early pneumonia. She was treated with Rocephin and azithromycin at the time. The patient reports that she was discharged to rehab. Currently is getting home health nurse. She reports feeling worsening fatigue from her baseline for at least 3 to 4 days now. She randomly used the pulse oximetry to test her respiratory status and found that she was hypoxic at 84% on room air. Typically on room air at rest she is in the mid 90's. Home health nurse was concerned and recommended that patient come to get evaluated. She denies any fevers, chills, nausea, or vomiting. She reports intermittent chest pain particularly in the left shoulder area as well as back pain. She denies any orthopnea or PND. No palpitations or syncope. The patient reports baseline cough at night. It is nonproductive. No changes in her shortness of breath actually and the main thing is she has had a poor appetite.   PAST MEDICAL HISTORY:  1. Admitted 02/01 to 02/10/2011 for management of acute on chronic respiratory failure thought to be in the setting of possibly early pneumonia.  2. 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  3. Polymyalgia rheumatica.  4. Hypothyroidism.  5. History of CVA.   6. Coronary artery disease status post stents.  7. Depression/anxiety.  8. Obesity.  9. Chronic obstructive pulmonary disease.  10. Hyperlipidemia.  11. Peripheral neuropathy.  12. Vascular dementia.   PAST SURGICAL HISTORY:  1. Right cataract.  2. Cesarean section x3.  3. Cholecystectomy.  4. Carotid endarterectomy on the left.  5. Status post stents x3.   ALLERGIES: Codeine, prednisone, sulfa drugs, Vioxx, and tape.   MEDICATIONS:  1. Spiriva daily.  2. Atorvastatin 40 mg daily.  3. Plavix 75 mg daily.  4. Levothroid 25 mcg daily.  5. Lisinopril 20 mg daily.  6. Metoprolol 12.5 mg daily.  7. Nitroglycerin sublingual 0.4 mg as needed.  8. Sertraline 50 mg daily.  9. Aspirin 81 mg daily.  10. Gabapentin 800 mg in the morning and 1600 mg at bedtime.  11. Trazodone 50 mg at bedtime.  12. Ranitidine 300 mg daily.  13. Advair 250/50 b.i.d.  14. Combivent inhaler as needed.   SOCIAL HISTORY: She lives at home with her husband. She quit smoking in 1998. Denies any alcohol or drug use. She was previously a Radio producer.   FAMILY HISTORY: Her son died and was diagnosed with MELAS. Mother died of stroke in her 25's.   REVIEW OF SYSTEMS: CONSTITUTIONAL: She endorses worsening fatigue from her baseline and poor appetite. EYES: History of cataracts. ENT: No epistaxis, discharge, or dysphagia. RESPIRATORY: As per history of present illness. No hemoptysis. CARDIOVASCULAR: She reports left shoulder pain and left upper chest wall pain. No orthopnea. No worsening edema. GI: No nausea, vomiting, diarrhea, abdominal pain. GU: She endorses dysuria. ENDOCRINE: No polyuria or polydipsia. HEME: She has easy  bruising. SKIN: No ulcers. MUSCULOSKELETAL: Reports low back pain and spine pain. NEUROLOGIC: History of stroke. No one-sided weakness or numbness. PSYCH: The patient has vascular dementia. She is a difficult historian.   PHYSICAL EXAMINATION:   VITAL SIGNS: Temperature 100.2, pulse 74,  respiratory rate 18, blood pressure 98/55, sating initially 91% on 2 liters.   GENERAL: Lying in bed in no apparent distress.   HEENT: Normocephalic, atraumatic. Pupils equal, symmetric, nonicteric. Nasal cannula in place. Moist mucous membrane.   NECK: Soft and supple. No adenopathy or JVP.   HEART: Slightly brady. No murmurs, rubs, or gallops.   LUNGS: Unable to appreciate any wheezes or rales. She does have some faint basilar crackles. No use of accessory muscles or increased respiratory effort.   ABDOMEN: Soft. Positive bowel sounds. No mass appreciated.   EXTREMITIES: Trace edema bilaterally. Dorsal pedis pulses intact.   MUSCULOSKELETAL: No joint effusion.   SKIN: No ulcers.   NEUROLOGIC: No dysarthria or aphasia. She has symmetrical squeeze. No focal deficits.   PSYCH: She is alert and cooperative.   PERTINENT LABS AND STUDIES: Glucose 117, BUN 18, creatinine 1.04, sodium 143, potassium 3.9, chloride 105, carbon dioxide 28, calcium 8.5, anion gap 10. Urinalysis with specific gravity of 1.021, pH 5, protein negative, nitrite negative, trace leukocyte esterase, RBC 1 per high-power field, WBC 2 per high-power field, bacteria 3+. WBC 7.9, hemoglobin 11.9, hematocrit 36.3, platelet 201, MCV 89. Troponin less than 0.02.   EKG with sinus rate of 64. No ST elevation or depression. There is T wave inversion in aVR and V1.   Chest x-ray with bilateral interstitial markings. Official chest x-ray read is pending.   ASSESSMENT AND PLAN: This is a 81 year old woman with history of COPD, hypertension, hyperlipidemia, coronary artery disease status post stents, polymyalgia rheumatica, hypothyroidism, MELAS, depression, anxiety, and vascular dementia representing with reports of worsening fatigue and hypoxia.  1. Acute on chronic hypoxic respiratory failure, last admitted with presumed early pneumonia but CT PE protocol did not mention an infiltrate at that time. It was read as possible  low-grade CHF and minimal atelectasis in the posterior aspect of the right lower lobe and underlying COPD. No PE was detected. She has a repeat chest x-ray that looks similar to her last admission x-ray, questionable inflammatory process with fever, hypoxia, and hypotension versus actually infiltrate or infection. Will empirically start on Levaquin as she received ceftriaxone and azithromycin on her last admission. Will obtain Pulmonary consultation for additional recommendations given her persistent chest x-ray findings and representation and admission. Will continue on oxygen, DuoNebs, Advair, and Spiriva. Her echo on last admission showed normal EF with no impaired relaxation and mild hypertension. Lasix caused a bump in her creatinine on last admission. Will hold her blood pressure meds for now. Her blood culture has been sent. Will send for sputum culture and IgE levels. She does have history of coronary artery disease. Her chest pain seems atypical but will continue on tele. Cycle cardiac enzymes. Restart her aspirin and Plavix. I will get a speech evaluation to determine aspiration risks given her recurrent admissions.  2. Hypertension as above presenting with relative hypotension. Will hold her metoprolol and lisinopril.  3. Hypothyroidism. Resume her Levothroid.  4. Hyperlipidemia. Resume Lipitor.  5. Back pain and dysuria with history of frequent urinary tract infections. Her urinalysis is unrevealing. Will send urine culture. Bacteria could be colonization.  6. Prophylaxis with Lovenox, aspirin, and ranitidine.      TIME SPENT: Approximately 50 minutes  on patient care.   ____________________________ Rita Ohara, MD ap:drc D: 06/03/2011 03:07:15 ET T: 06/03/2011 09:25:28 ET JOB#: [id number]  cc: Alvar Malinoski, MD, <Dictator> Talia M. Deborra Medina, MD Rita Ohara MD ELECTRONICALLY SIGNED 06/10/2011 22:35

## 2014-04-28 NOTE — H&P (Signed)
PATIENT NAME:  Joy [name], ALDOUS MR#:  [id number] DATE OF BIRTH:  Sep 20, 1930  DATE OF ADMISSION:  02/05/2011  PRIMARY CARE PHYSICIAN: Marciano Sequin. Deborra Medina, MD  PULMONOLOGIST: [facility], [place]  CHIEF COMPLAINT: Shortness of breath and back pain.   HISTORY OF PRESENT ILLNESS: Joy [name] is a 81 year old Caucasian female with past medical history of chronic obstructive pulmonary disease, hypothyroidism, and coronary artery disease who comes to the Emergency Room after she was seen this past Tuesday by her primary care physician. She was started on outpatient Avelox for possible respiratory infection. The patient continued to feel short of breath and was found to be hypoxic in the upper 80s on room air. She does not normally wear oxygen at home. She had some back pain and a chest x-ray revealed bibasilar developing infiltrate. She is being admitted for hypoxemia, respiratory failure, and bibasilar pneumonia.   PAST MEDICAL HISTORY:  1. MELAS, diagnosed in January 2012 at [facility].  2. Polymyalgia rheumatica.  3. Hypothyroidism.  4. History of cerebrovascular accident.  5. Coronary artery disease.  6. Depression.  7. Obesity.  8. History of chronic obstructive pulmonary disease with history of heavy smoking in the past, quit 15 years ago.   PAST SURGICAL HISTORY:  1. Cesarean section x3.  2. Cholecystectomy.  3. Carotid endarterectomy, on the left.  4. Status post stent x3.   ALLERGIES: Codeine, prednisone, sulfa, Vioxx, tape.  MEDICATIONS:  1. Spiriva 1 inhalation daily.  2. Atorvastatin 1 tablet daily.  3. Plavix 75 mg daily.  4. Levothroid 25 mcg p.o. daily.  5. Lisinopril 20 mg daily.  6. Metoprolol 25 mg 1/2 tablet daily.  7. Nitroglycerin 0.4 mg as needed.  8. Sertraline 50 mg daily.  9. Aspirin 81 mg daily.  10. Gabapentin 800 mg in the morning and 1600 mg at bedtime.  11. Trazodone 50 mg daily.  12. Ranitidine 300 mg daily.   SOCIAL HISTORY: She lives at home with  her husband. She quit smoking 15 years ago. She denies any drug use or alcohol use. She was a Radio producer in the past.   FAMILY HISTORY: Son died and was diagnosed with MELAS. Mother died of stroke in her 60s.   REVIEW OF SYSTEMS: CONSTITUTIONAL: Positive for fatigue and weakness. No fever. EYES: No blurred or double vision. ENT: No tinnitus, ear pain, hearing loss, or sinus congestion. RESPIRATORY: Positive for cough, shortness of breath, and chronic obstructive pulmonary disease. CARDIOVASCULAR: No chest pain, orthopnea, or edema. GI: No nausea, vomiting, diarrhea, or abdominal pain. No GERD. GU: No dysuria or hematuria. ENDOCRINE: No polyuria or nocturia. HEMATOLOGY: No anemia or easy bruising. SKIN: No acne or rash.  MUSCULOSKELETAL: Positive for arthritis. NEUROLOGIC: No cerebrovascular accident or transient ischemic attack. PSYCH: No anxiety or depression. All other systems reviewed and negative.   PHYSICAL EXAMINATION:   GENERAL: Awake, alert, and oriented x3, not in acute distress.   VITAL SIGNS: Afebrile, pulse 77, blood pressure 161/71, saturation 97% on 3 liters nasal cannula oxygen.   HEENT: Atraumatic, normocephalic. Pupils are equal, round, and reactive to light and accommodation. Extraocular movements intact. Oral mucosa is dry.   NECK: Supple. No JVD. No carotid bruit.   LUNGS: No wheezing. Bilaterally good air entry. Decreased breath sounds at the bases. No respiratory distress.   CARDIOVASCULAR: Both the heart sounds are normal. Rhythm is regular. PMI is not lateralized. Chest is nontender.   EXTREMITIES: Good pedal pulses, good femoral pulses. No lower extremity edema.   ABDOMEN: Soft, benign,  and nontender. No organomegaly.   NEUROLOGIC: Cranial nerves II through XII grossly intact. No motor or sensory deficit. Subjective weakness present.   PSYCH: The patient is awake, alert, and oriented x3.   LABS/STUDIES: Chest x-ray: Mild bilateral interstitial thickening and  peribronchial cuffing with consideration for lower airway disease secondary to infectious or inflammatory etiology.   Cardiac enzymes negative. CBC within normal limits. Comprehensive metabolic panel within normal limits, except albumin 3.2 and total protein 6.1. B-type natriuretic peptide 1016.  ASSESSMENT AND PLAN: 81 year old Joy [name] with:  1. Acute on chronic hypoxic respiratory failure with mild chronic obstructive pulmonary disease flare.  2. Bibasilar early pneumonia.  3. Hypertension.  4. Peripheral neuropathy.  5. Hypothyroidism.  6. Memory loss.  7. History of MELAS diagnosed in January 2012. (mitochondrial encephalomyopathy, lactic acidosis, and stroke-like episodes).   PLAN:  1. Admit patient to the medical floor.  2. FULL CODE. 3. We will start the patient on IV Rocephin and continue p.o. Zithromax. The patient is intolerant to prednisone. We will hold off on it. She does not seem to be in acute severe respiratory distress or flare. We will continue DuoNebs and Spiriva. 4. We will continue all home medications.  5. Follow blood cultures and sputum culture along with urine cultures.  6. Physical therapy to see the patient.  7. Care management for discharge planning.  8. Assess home oxygen need prior to discharge.   Further work-up according to the patient's clinical course. The hospital admission plan was discussed with the patient and her daughter who are agreeable to it.   TIME SPENT: 50 minutes. ____________________________ Hart Rochester Posey Pronto, MD sap:slb D: 02/05/2011 21:27:06 ET T: 02/06/2011 08:53:35 ET JOB#: [id number]  cc: Jaliza Seifried A. Posey Pronto, MD, <Dictator> Marciano Sequin. Deborra Medina, MD [place] MD ELECTRONICALLY SIGNED 02/06/2011 15:42

## 2014-04-28 NOTE — Consult Note (Signed)
PATIENT NAME:  Joy [name], Joy [name] MR#:  [id number] DATE OF BIRTH:  Mar 15, 1930  DATE OF CONSULTATION:  06/03/2011  REFERRING PHYSICIAN:   CONSULTING PHYSICIAN:  Allyne Gee, MD  REASON FOR CONSULTATION: I am asked to see the patient for possible interstitial lung disease.   HISTORY OF PRESENT ILLNESS: The patient is a 81 year old female who was admitted back in February of this year with complaints of shortness of breath. At that time she had a CT scan of the chest done which showed some low-grade CHF-type pattern. She came into the hospital this time with increasing shortness of breath. She was having pulse oximetry of about 84% and this was low for her. She has not been having any cough or congestion. She has not had any fevers noted. In the past when she was admitted she was treated with antibiotics. The patient does have some shortness of breath. The patient has some reflux-type symptoms also.   PAST MEDICAL HISTORY:  1. Polymyalgia rheumatica.  2. Hypothyroidism.  3. Stroke. 4. Coronary artery disease. 5. Depression. 6. Anxiety. 7. Obesity. 8. Hyperlipidemia. 9. Peripheral neuropathy. 10. Dementia.   PAST SURGICAL HISTORY:  1. Cesarean section. 2. Cholecystectomy.  3. Cataract surgery. 4. Carotid endarterectomy.   5. Cardiac stents placed.   ALLERGIES: Prednisone, codeine, sulfa, Biaxin, tape.   MEDICATIONS: Reviewed on the electronic medical record.   SOCIAL HISTORY: She was a former smoker up until about 1998. No alcohol or drug use. 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   FAMILY HISTORY: Positive for a diagnosis of MELAS and also positive for strokes.   REVIEW OF SYSTEMS: GENERAL: She has been having more fatigue and appetite has not been very good. HEENT: Negative for any bleeding. No dysphagia. CARDIOVASCULAR: Positive for left upper chest wall pain. RESPIRATORY: Positive for shortness of breath. GU: Negative. ENDOCRINE: Negative. HEME:  Negative. SKIN: Without new bruises or breakdown. MUSCULOSKELETAL: Negative other than for some chronic osteoarthritis. NEUROLOGIC: Positive for history of stroke. PSYCHIATRIC: Negative for any depression but she does have a history of dementia. The remainder of review of systems performed was unremarkable.   PHYSICAL EXAMINATION:   GENERAL: At the time she was seen her temperature was running 98.5, pulse 66, respiratory rate 20, blood pressure 114/71, and her saturations were 94% on 2 liters nasal cannula.   NECK: Neck appeared to be supple. There was no JVD. No adenopathy. No thyromegaly.   CHEST: Few coarse rhonchi. There were some dry rales.   CARDIOVASCULAR: S1, S2 is normal. Regular rhythm. No gallop.   NEUROLOGIC: The patient was awake and moving all four extremities.   ABDOMEN: Benign.    IMPRESSION:  1. Acute on chronic respiratory failure.  2. Possible interstitial lung disease.   PLAN: Will get a high resolution CT scan ordered and also get ANA, sed rate, and rheumatoid factor. Also, we will get an ANCA level done on her in addition to anti-GBM antibodies.    Will continue to follow. Further recommendations as warranted.   ____________________________ Allyne Gee, MD sak:drc D: 06/03/2011 12:39:37 ET T: 06/03/2011 13:20:29 ET JOB#: [id number]  cc: Allyne Gee, MD, <Dictator> Allyne Gee MD ELECTRONICALLY SIGNED 06/29/2011 15:52

## 2014-04-30 ENCOUNTER — Encounter (INDEPENDENT_AMBULATORY_CARE_PROVIDER_SITE_OTHER): Payer: Medicare Other

## 2014-04-30 DIAGNOSIS — I6523 Occlusion and stenosis of bilateral carotid arteries: Secondary | ICD-10-CM

## 2014-05-04 ENCOUNTER — Other Ambulatory Visit: Payer: Self-pay | Admitting: Family Medicine

## 2014-05-06 ENCOUNTER — Telehealth: Payer: Self-pay

## 2014-05-06 DIAGNOSIS — I6523 Occlusion and stenosis of bilateral carotid arteries: Secondary | ICD-10-CM

## 2014-05-06 NOTE — Telephone Encounter (Signed)
S/w pt daughter [name], dpr on file. Daughter aware of results. Repeat carotid in one year.

## 2014-05-08 ENCOUNTER — Encounter: Payer: Self-pay | Admitting: Family Medicine

## 2014-05-08 ENCOUNTER — Ambulatory Visit (INDEPENDENT_AMBULATORY_CARE_PROVIDER_SITE_OTHER): Payer: Medicare Other | Admitting: Family Medicine

## 2014-05-08 VITALS — BP 116/52 | HR 76 | Temp 98.2°F | Wt 200.5 lb

## 2014-05-08 DIAGNOSIS — I10 Essential (primary) hypertension: Secondary | ICD-10-CM | POA: Diagnosis not present

## 2014-05-08 DIAGNOSIS — N390 Urinary tract infection, site not specified: Secondary | ICD-10-CM | POA: Diagnosis not present

## 2014-05-08 DIAGNOSIS — G629 Polyneuropathy, unspecified: Secondary | ICD-10-CM

## 2014-05-08 DIAGNOSIS — Z79899 Other long term (current) drug therapy: Secondary | ICD-10-CM

## 2014-05-08 DIAGNOSIS — R5383 Other fatigue: Secondary | ICD-10-CM | POA: Insufficient documentation

## 2014-05-08 DIAGNOSIS — I6523 Occlusion and stenosis of bilateral carotid arteries: Secondary | ICD-10-CM

## 2014-05-08 DIAGNOSIS — Y92099 Unspecified place in other non-institutional residence as the place of occurrence of the external cause: Secondary | ICD-10-CM

## 2014-05-08 DIAGNOSIS — R5382 Chronic fatigue, unspecified: Secondary | ICD-10-CM

## 2014-05-08 DIAGNOSIS — Z87440 Personal history of urinary (tract) infections: Secondary | ICD-10-CM

## 2014-05-08 DIAGNOSIS — W19XXXA Unspecified fall, initial encounter: Secondary | ICD-10-CM | POA: Insufficient documentation

## 2014-05-08 DIAGNOSIS — B373 Candidiasis of vulva and vagina: Secondary | ICD-10-CM | POA: Diagnosis not present

## 2014-05-08 DIAGNOSIS — B3731 Acute candidiasis of vulva and vagina: Secondary | ICD-10-CM

## 2014-05-08 DIAGNOSIS — Y92009 Unspecified place in unspecified non-institutional (private) residence as the place of occurrence of the external cause: Secondary | ICD-10-CM

## 2014-05-08 DIAGNOSIS — E785 Hyperlipidemia, unspecified: Secondary | ICD-10-CM | POA: Diagnosis not present

## 2014-05-08 DIAGNOSIS — R300 Dysuria: Secondary | ICD-10-CM | POA: Diagnosis not present

## 2014-05-08 DIAGNOSIS — E079 Disorder of thyroid, unspecified: Secondary | ICD-10-CM

## 2014-05-08 LAB — CBC WITH DIFFERENTIAL/PLATELET
Basophils Absolute: 0 10*3/uL (ref 0.0–0.1)
Basophils Relative: 0.2 % (ref 0.0–3.0)
EOS PCT: 2.8 % (ref 0.0–5.0)
Eosinophils Absolute: 0.2 10*3/uL (ref 0.0–0.7)
HCT: 35.8 % — ABNORMAL LOW (ref 36.0–46.0)
Hemoglobin: 11.9 g/dL — ABNORMAL LOW (ref 12.0–15.0)
Lymphocytes Relative: 13.7 % (ref 12.0–46.0)
Lymphs Abs: 1.1 10*3/uL (ref 0.7–4.0)
MCHC: 33.1 g/dL (ref 30.0–36.0)
MCV: 85.7 fl (ref 78.0–100.0)
MONOS PCT: 9.1 % (ref 3.0–12.0)
Monocytes Absolute: 0.7 10*3/uL (ref 0.1–1.0)
NEUTROS PCT: 74.2 % (ref 43.0–77.0)
Neutro Abs: 5.9 10*3/uL (ref 1.4–7.7)
Platelets: 190 10*3/uL (ref 150.0–400.0)
RBC: 4.18 Mil/uL (ref 3.87–5.11)
RDW: 16.5 % — ABNORMAL HIGH (ref 11.5–15.5)
WBC: 7.9 10*3/uL (ref 4.0–10.5)

## 2014-05-08 LAB — COMPREHENSIVE METABOLIC PANEL
ALBUMIN: 4.1 g/dL (ref 3.5–5.2)
ALT: 8 U/L (ref 0–35)
AST: 16 U/L (ref 0–37)
Alkaline Phosphatase: 133 U/L — ABNORMAL HIGH (ref 39–117)
BUN: 24 mg/dL — AB (ref 6–23)
CHLORIDE: 106 meq/L (ref 96–112)
CO2: 34 mEq/L — ABNORMAL HIGH (ref 19–32)
Calcium: 9.4 mg/dL (ref 8.4–10.5)
Creatinine, Ser: 1.21 mg/dL — ABNORMAL HIGH (ref 0.40–1.20)
GFR: 45.34 mL/min — ABNORMAL LOW (ref >60.00)
GLUCOSE: 91 mg/dL (ref 70–99)
POTASSIUM: 5.1 meq/L (ref 3.5–5.1)
Sodium: 144 mEq/L (ref 135–145)
Total Bilirubin: 0.7 mg/dL (ref 0.2–1.2)
Total Protein: 6.3 g/dL (ref 6.0–8.3)

## 2014-05-08 LAB — T4, FREE: FREE T4: 0.64 ng/dL (ref 0.60–1.60)

## 2014-05-08 LAB — TSH: TSH: 1.52 u[IU]/mL (ref 0.35–4.50)

## 2014-05-08 LAB — VITAMIN B12: Vitamin B-12: 817 pg/mL (ref 211–911)

## 2014-05-08 MED ORDER — FLUCONAZOLE 150 MG PO TABS: ORAL_TABLET | ORAL | Status: DC

## 2014-05-08 NOTE — Progress Notes (Signed)
Pre visit review using our clinic review tool, if applicable. No additional management support is needed unless otherwise documented below in the visit note. 

## 2014-05-08 NOTE — Assessment & Plan Note (Signed)
Likely multifactorial. Labs today. Orders Placed This Encounter  Procedures  . Comprehensive metabolic panel  . CBC with Differential/Platelet  . TSH  . Vitamin B12  . T4, Free

## 2014-05-08 NOTE — Assessment & Plan Note (Signed)
New- first fall in quite sometime. Consider restarting PT/OT if weakness worsens. Still using walker.

## 2014-05-08 NOTE — Assessment & Plan Note (Signed)
New- Pt deferred exam today. Failed topical rx. eRx sent for oral diflucan. Call or return to clinic prn if these symptoms worsen or fail to improve as anticipated. The patient indicates understanding of these issues and agrees with the plan.

## 2014-05-08 NOTE — Addendum Note (Signed)
Addended by: Marchia Bond on: 05/08/2014 04:33 PM   Modules accepted: Orders

## 2014-05-08 NOTE — Progress Notes (Signed)
Subjective:   Patient ID: Joy [name], female    DOB: 02-Aug-1930, 81 y.o.   MRN: [id number]  Joy [name] is a pleasant 81 y.o. year old female who presents to clinic today with Hospitalization Follow-up  on 05/08/2014  HPI:  We are still awaiting records but daughter is relaying as much information as she can.  Taken to [facility] via EMS after a fall in the bathroom.  She thinks she just "slipped" off the commode but her husband and kids did feel she had been weaker, more fatigued and more forgetful lately. According to Joy [name], negative work up other than UTI.  No dysuria or other symptoms of UTI.  Lab Results  Component Value Date   TSH 2.27 08/08/2013    Treated with abx- cannot remember which one- feels less fatigued but still tired and more forgetful. Now has a "raging" yeast infection of her vulva and anus.  Daughter applied topical antifungal which helped a little but still having symptoms.  No CP recently.  Saw  Dr. Fletcher Anon on 4/4.  Note reviewed. Carotid dopplers done on 4/27.  Followed by pulmonary.  Now on continuous O2.  Feels more chronically short of breath now.  Seeing neuro next month.  Current Outpatient Prescriptions on File Prior to Visit  Medication Sig Dispense Refill  . ADVAIR DISKUS 250-50 MCG/DOSE AEPB INHALE 1 PUFF BY MOUTH TWICE A DAY 60 each 5  . albuterol (PROVENTIL HFA;VENTOLIN HFA) 108 (90 BASE) MCG/ACT inhaler Inhale 2 puffs into the lungs every 6 (six) hours as needed for wheezing. 1 Inhaler 0  . amLODipine (NORVASC) 5 MG tablet TAKE 1 TABLET BY MOUTH EVERY DAY 90 tablet 0  . aspirin EC 81 MG tablet Take 81 mg by mouth daily.      Marland Kitchen atorvastatin (LIPITOR) 40 MG tablet Take 1 tablet (40 mg total) by mouth daily. 90 tablet 1  . clopidogrel (PLAVIX) 75 MG tablet TAKE 1 TABLET BY MOUTH EVERY DAY 30 tablet 5  . gabapentin (NEURONTIN) 800 MG tablet Take 1 tablet (800 mg total) by mouth 2 (two) times daily. 60 tablet 3  . levothyroxine (SYNTHROID,  LEVOTHROID) 25 MCG tablet TAKE 1 TABLET BY MOUTH EVERY DAY 30 tablet 3  . lisinopril (PRINIVIL,ZESTRIL) 10 MG tablet TAKE 2 TABLETS (20 MG TOTAL) BY MOUTH DAILY. 60 tablet 5  . nitroGLYCERIN (NITROSTAT) 0.4 MG SL tablet Place 0.4 mg under the tongue every 5 (five) minutes as needed.      . ranitidine (ZANTAC) 300 MG tablet TAKE 1 TABLET (300 MG TOTAL) BY MOUTH AT BEDTIME. 90 tablet 3  . sertraline (ZOLOFT) 100 MG tablet TAKE 1 TABLET BY MOUTH EVERY DAY 30 tablet 5  . tiotropium (SPIRIVA HANDIHALER) 18 MCG inhalation capsule Place 1 capsule (18 mcg total) into inhaler and inhale daily. 90 capsule 2  . traZODone (DESYREL) 50 MG tablet TAKE 1/2 TO 1 TABLET AT BEDTIME AS NEEDED FOR SLEEP 30 tablet 2  . Vitamin D, Ergocalciferol, (DRISDOL) 50000 UNITS CAPS capsule Take 1 capsule (50,000 Units total) by mouth every 7 (seven) days. 6 capsule 3   No current facility-administered medications on file prior to visit.    Allergies  Allergen Reactions  . Codeine Other (See Comments)    Coma for 2 days  . Prednisone Other (See Comments)    Weight gain,puffy face  . Shellfish Allergy     Past Medical History  Diagnosis Date  . HTN (hypertension)   . COPD (chronic obstructive pulmonary disease)   .  Depression   . Thyroid disease   . PMR (polymyalgia rheumatica)     with h/o steroid induced myopathy  . Anxiety   . Diastolic dysfunction     Echo 05/11/2010, EF 65-70%  . TIA (transient ischemic attack)     11/05 s/p L CEA  . Kidney infection   . Carotid artery occlusion   . CAD (coronary artery disease)     h/o NSTEMI, LAD stent placement 10/04, cath 05/11/2010  . HLD (hyperlipidemia)     Past Surgical History  Procedure Laterality Date  . Cholecystectomy    . Carotid endarterectomy      left with stent placement 11/2003  . Iliac artery stent      right, 2001  . Cardiac catheterization      unc  . Cardiac catheterization    . Cardiac catheterization    . Lung biopsy      No family  history on file.  History   Social History  . Marital Status: Married    Spouse Name: N/A  . Number of Children: N/A  . Years of Education: N/A   Occupational History  . retired     Pharmacist, hospital   Social History Main Topics  . Smoking status: Former Smoker -- 1.00 packs/day for 25 years    Types: Cigarettes    Quit date: 01/05/1996  . Smokeless tobacco: Never Used  . Alcohol Use: No  . Drug Use: No  . Sexual Activity: Not on file   Other Topics Concern  . Not on file   Social History Narrative   Lives with husband, Joy [name] at [place].   Daughters, Joy [name] and Joy [name] very involved.   The PMH, PSH, Social History, Family History, Medications, and allergies have been reviewed in [facility], and have been updated if relevant.    Review of Systems  Constitutional: Positive for fatigue. Negative for appetite change.  HENT: Negative.   Respiratory: Positive for shortness of breath. Negative for wheezing.   Cardiovascular: Negative.   Gastrointestinal: Negative.   Endocrine: Negative.   Genitourinary: Positive for frequency, vaginal discharge and vaginal pain. Negative for dysuria, urgency and vaginal bleeding.  Musculoskeletal: Negative.   Skin: Positive for rash.  Allergic/Immunologic: Negative.   Neurological: Negative.   Hematological: Negative.   Psychiatric/Behavioral: Negative.   All other systems reviewed and are negative.      Objective:    BP 116/52 mmHg  Pulse 76  Temp(Src) 98.2 F (36.8 C) (Oral)  Wt 200 lb 8 oz (90.946 kg)  SpO2 93% Wt Readings from Last 3 Encounters:  05/08/14 200 lb 8 oz (90.946 kg)  04/08/14 202 lb 4 oz (91.74 kg)  02/01/14 199 lb 8 oz (90.493 kg)     Physical Exam  Constitutional: She is oriented to person, place, and time. She appears well-developed and well-nourished. No distress.  HENT:  Head: Normocephalic.  Eyes: Conjunctivae are normal.  Neck: Neck supple.  Cardiovascular: Normal rate.   Pulmonary/Chest: Effort normal. She  has no wheezes. She has no rales. She exhibits no tenderness.  Decreased BS bilaterally  Musculoskeletal: She exhibits no edema.  Neurological: She is alert and oriented to person, place, and time. No cranial nerve deficit.  Skin: Skin is warm and dry.  Psychiatric: She has a normal mood and affect. Her behavior is normal. Judgment and thought content normal.  Nursing note and vitals reviewed.         Assessment & Plan:   Neuropathy - Plan: Comprehensive  metabolic panel, CBC with Differential/Platelet, Vitamin B12  Fall at home, initial encounter  Yeast vaginitis - Plan: TSH, T4, Free  Thyroid disease  Chronic fatigue No Follow-up on file.

## 2014-05-08 NOTE — Assessment & Plan Note (Signed)
Due for labs, will check today.

## 2014-05-08 NOTE — Assessment & Plan Note (Signed)
Persistent.  Taking gabapentin.  Followed by neurology but wont see her until next month. Will check labs today.

## 2014-05-11 LAB — URINE CULTURE

## 2014-05-21 ENCOUNTER — Ambulatory Visit: Payer: Self-pay | Admitting: Internal Medicine

## 2014-05-21 ENCOUNTER — Telehealth: Payer: Self-pay

## 2014-05-21 NOTE — Telephone Encounter (Signed)
Joy [name] pts daughter said pt having burning upon urination,chills and confusion. Pt has appt to see [name] NP 05/21/14 at 4 pm.

## 2014-05-23 ENCOUNTER — Ambulatory Visit (INDEPENDENT_AMBULATORY_CARE_PROVIDER_SITE_OTHER): Payer: Medicare Other | Admitting: Internal Medicine

## 2014-05-23 ENCOUNTER — Other Ambulatory Visit: Payer: Self-pay | Admitting: Family Medicine

## 2014-05-23 ENCOUNTER — Encounter: Payer: Self-pay | Admitting: Internal Medicine

## 2014-05-23 VITALS — BP 126/78 | HR 72 | Temp 98.6°F | Wt 199.0 lb

## 2014-05-23 DIAGNOSIS — R300 Dysuria: Secondary | ICD-10-CM

## 2014-05-23 DIAGNOSIS — N390 Urinary tract infection, site not specified: Secondary | ICD-10-CM

## 2014-05-23 DIAGNOSIS — B379 Candidiasis, unspecified: Secondary | ICD-10-CM | POA: Diagnosis not present

## 2014-05-23 DIAGNOSIS — I6523 Occlusion and stenosis of bilateral carotid arteries: Secondary | ICD-10-CM | POA: Diagnosis not present

## 2014-05-23 DIAGNOSIS — T3695XA Adverse effect of unspecified systemic antibiotic, initial encounter: Secondary | ICD-10-CM

## 2014-05-23 LAB — POCT URINALYSIS DIPSTICK
Bilirubin, UA: NEGATIVE
Blood, UA: NEGATIVE
GLUCOSE UA: NEGATIVE
Ketones, UA: NEGATIVE
Nitrite, UA: POSITIVE
PROTEIN UA: NEGATIVE
UROBILINOGEN UA: NEGATIVE
pH, UA: 5.5

## 2014-05-23 MED ORDER — CIPROFLOXACIN HCL 250 MG PO TABS
250.0000 mg | ORAL_TABLET | Freq: Two times a day (BID) | ORAL | Status: DC
Start: 2014-05-23 — End: 2014-06-11

## 2014-05-23 MED ORDER — FLUCONAZOLE 150 MG PO TABS: ORAL_TABLET | ORAL | Status: DC

## 2014-05-23 NOTE — Progress Notes (Addendum)
HPI  Pt presents to the clinic today with c/o urgency, frequency and increased confusion. This started 1 week ago. She denies fever, nausea or vomiting, but has had some chills. She has not taken anything OTC for this. She has had UTI 's in the past and reports this feels the same.   Review of Systems  Past Medical History  Diagnosis Date  . HTN (hypertension)   . COPD (chronic obstructive pulmonary disease)   . Depression   . Thyroid disease   . PMR (polymyalgia rheumatica)     with h/o steroid induced myopathy  . Anxiety   . Diastolic dysfunction     Echo 05/11/2010, EF 65-70%  . TIA (transient ischemic attack)     11/05 s/p L CEA  . Kidney infection   . Carotid artery occlusion   . CAD (coronary artery disease)     h/o NSTEMI, LAD stent placement 10/04, cath 05/11/2010  . HLD (hyperlipidemia)     No family history on file.  History   Social History  . Marital Status: Married    Spouse Name: N/A  . Number of Children: N/A  . Years of Education: N/A   Occupational History  . retired     Pharmacist, hospital   Social History Main Topics  . Smoking status: Former Smoker -- 1.00 packs/day for 25 years    Types: Cigarettes    Quit date: 01/05/1996  . Smokeless tobacco: Never Used  . Alcohol Use: No  . Drug Use: No  . Sexual Activity: Not on file   Other Topics Concern  . Not on file   Social History Narrative   Lives with husband, [name] at [place].   Daughters, [name] and [name] very involved.    Allergies  Allergen Reactions  . Codeine Other (See Comments)    Coma for 2 days  . Prednisone Other (See Comments)    Weight gain,puffy face  . Shellfish Allergy     Constitutional: Denies fever, malaise, fatigue, headache or abrupt weight changes.   GU: Pt reports urgency, frequency. Denies burning sensation, blood in urine, odor or discharge. Skin: Denies redness, rashes, lesions or ulcercations.  Neuro: Pt reports confusion.  No other specific complaints in a  complete review of systems (except as listed in HPI above).    Objective:   Physical Exam  BP 126/78 mmHg  Pulse 72  Temp(Src) 98.6 F (37 C) (Oral)  Wt 199 lb (90.266 kg)  SpO2 96%  Wt Readings from Last 3 Encounters:  05/23/14 199 lb (90.266 kg)  05/08/14 200 lb 8 oz (90.946 kg)  04/08/14 202 lb 4 oz (91.74 kg)    General: Appears her stated age, chronically ill appearing in NAD. Cardiovascular: Normal rate and rhythm. S1,S2 noted.  No murmur, rubs or gallops noted.  Pulmonary/Chest: Normal effort and positive vesicular breath sounds. No respiratory distress. No wheezes, rales or ronchi noted.  Abdomen: Soft. Normal bowel sounds, no bruits noted. No distention or masses noted. Tender to palpation over the bladder area. No CVA tenderness.      Assessment & Plan:   Urgency, Frequency, AMS secondary to UTI:  Urinalysis: 3+ leuks, + nitrites Will send urine culture eRx sent if for Cipro 250 mg BID x 5 days eRx for Diflucan for antibiotic induced yeast infection OK to take AZO OTC Drink plenty of fluids  RTC as needed or if symptoms persist.

## 2014-05-23 NOTE — Addendum Note (Signed)
Addended by: Jearld Fenton on: 05/23/2014 11:17 AM   Modules accepted: Orders, SmartSet

## 2014-05-23 NOTE — Progress Notes (Signed)
Pre visit review using our clinic review tool, if applicable. No additional management support is needed unless otherwise documented below in the visit note. 

## 2014-05-23 NOTE — Addendum Note (Signed)
Addended by: Lurlean Nanny on: 05/23/2014 11:57 AM   Modules accepted: Orders

## 2014-05-23 NOTE — Patient Instructions (Signed)

## 2014-05-26 LAB — URINE CULTURE

## 2014-05-30 ENCOUNTER — Other Ambulatory Visit: Payer: Self-pay | Admitting: Family Medicine

## 2014-06-06 DIAGNOSIS — G609 Hereditary and idiopathic neuropathy, unspecified: Secondary | ICD-10-CM | POA: Diagnosis not present

## 2014-06-06 DIAGNOSIS — G250 Essential tremor: Secondary | ICD-10-CM | POA: Diagnosis not present

## 2014-06-06 DIAGNOSIS — R413 Other amnesia: Secondary | ICD-10-CM | POA: Diagnosis not present

## 2014-06-06 DIAGNOSIS — Z79899 Other long term (current) drug therapy: Secondary | ICD-10-CM | POA: Diagnosis not present

## 2014-06-06 DIAGNOSIS — R918 Other nonspecific abnormal finding of lung field: Secondary | ICD-10-CM | POA: Diagnosis not present

## 2014-06-11 ENCOUNTER — Encounter: Payer: Self-pay | Admitting: Family Medicine

## 2014-06-11 ENCOUNTER — Ambulatory Visit (INDEPENDENT_AMBULATORY_CARE_PROVIDER_SITE_OTHER): Payer: Medicare Other | Admitting: Family Medicine

## 2014-06-11 VITALS — BP 128/74 | HR 41 | Temp 98.5°F | Wt 201.2 lb

## 2014-06-11 DIAGNOSIS — N760 Acute vaginitis: Secondary | ICD-10-CM | POA: Diagnosis not present

## 2014-06-11 DIAGNOSIS — I6523 Occlusion and stenosis of bilateral carotid arteries: Secondary | ICD-10-CM | POA: Diagnosis not present

## 2014-06-11 DIAGNOSIS — N183 Chronic kidney disease, stage 3 unspecified: Secondary | ICD-10-CM

## 2014-06-11 DIAGNOSIS — R42 Dizziness and giddiness: Secondary | ICD-10-CM

## 2014-06-11 MED ORDER — TERCONAZOLE 0.8 % VA CREA: 1.0000 | TOPICAL_CREAM | Freq: Every day | VAGINAL | Status: DC

## 2014-06-11 NOTE — Progress Notes (Signed)
Pre visit review using our clinic review tool, if applicable. No additional management support is needed unless otherwise documented below in the visit note. 

## 2014-06-11 NOTE — Assessment & Plan Note (Signed)
New- refusing UA and pelvic exam. Treat empirically with terazol cream. Call or return to clinic prn if these symptoms worsen or fail to improve as anticipated. The patient indicates understanding of these issues and agrees with the plan.

## 2014-06-11 NOTE — Patient Instructions (Signed)
Great to see you. Please decrease propranolol to 30 mg daily. Keep me updated.

## 2014-06-11 NOTE — Progress Notes (Signed)
Subjective:   Patient ID: Joy [name], female    DOB: December 03, 1930, 81 y.o.   MRN: [id number]  Joy [name] is a pleasant 81 y.o. year old female who presents to clinic today with with her daughter, [name], for Follow-up  on 06/11/2014  HPI:  Saw neurologist, Dr. Manson Allan at [facility] last week. Visit went well.  Scheduled for MRI and neuropsych eval this month.  Started on Propranolol 79 mg daily for tremor.  Feels tremor has already improved but she is quite tired and dizzy.  Has been falling more lately. Per pt's daughter, did well on MMSE.  Dr. Manson Allan did bring up elevated Cr- was 1.2 which is her baseline.  Admits to not drinking much water.  Does not take NSAIDs. Lab Results  Component Value Date   CREATININE 1.21* 05/08/2014   She has been having more vulvular irritation and itching as well.  No fevers, chills, nausea or vomiting.  Current Outpatient Prescriptions on File Prior to Visit  Medication Sig Dispense Refill  . ADVAIR DISKUS 250-50 MCG/DOSE AEPB INHALE 1 PUFF BY MOUTH TWICE A DAY 60 each 5  . albuterol (PROVENTIL HFA;VENTOLIN HFA) 108 (90 BASE) MCG/ACT inhaler Inhale 2 puffs into the lungs every 6 (six) hours as needed for wheezing. 1 Inhaler 0  . amLODipine (NORVASC) 5 MG tablet TAKE 1 TABLET BY MOUTH EVERY DAY 90 tablet 0  . amLODipine (NORVASC) 5 MG tablet TAKE 1 TABLET BY MOUTH EVERY DAY 90 tablet 0  . aspirin EC 81 MG tablet Take 81 mg by mouth daily.      Marland Kitchen atorvastatin (LIPITOR) 40 MG tablet Take 1 tablet (40 mg total) by mouth daily. 90 tablet 1  . clopidogrel (PLAVIX) 75 MG tablet TAKE 1 TABLET BY MOUTH EVERY DAY 30 tablet 5  . gabapentin (NEURONTIN) 800 MG tablet Take 1 tablet (800 mg total) by mouth 2 (two) times daily. 60 tablet 3  . levothyroxine (SYNTHROID, LEVOTHROID) 25 MCG tablet TAKE 1 TABLET BY MOUTH EVERY DAY 30 tablet 3  . lisinopril (PRINIVIL,ZESTRIL) 10 MG tablet TAKE 2 TABLETS (20 MG TOTAL) BY MOUTH DAILY. 60 tablet 5  . nitroGLYCERIN (NITROSTAT)  0.4 MG SL tablet Place 0.4 mg under the tongue every 5 (five) minutes as needed.      . ranitidine (ZANTAC) 300 MG tablet TAKE 1 TABLET (300 MG TOTAL) BY MOUTH AT BEDTIME. 90 tablet 1  . sertraline (ZOLOFT) 100 MG tablet TAKE 1 TABLET BY MOUTH EVERY DAY 30 tablet 5  . tiotropium (SPIRIVA HANDIHALER) 18 MCG inhalation capsule Place 1 capsule (18 mcg total) into inhaler and inhale daily. 90 capsule 2  . traZODone (DESYREL) 50 MG tablet TAKE 1/2 TO 1 TABLET AT BEDTIME AS NEEDED FOR SLEEP 30 tablet 2  . Vitamin D, Ergocalciferol, (DRISDOL) 50000 UNITS CAPS capsule TAKE 1 CAPSULE (50,000 UNITS TOTAL) BY MOUTH EVERY 7 (SEVEN) DAYS. 6 capsule 3   No current facility-administered medications on file prior to visit.    Allergies  Allergen Reactions  . Codeine Other (See Comments)    Coma for 2 days  . Prednisone Other (See Comments)    Weight gain,puffy face  . Shellfish Allergy     Past Medical History  Diagnosis Date  . HTN (hypertension)   . COPD (chronic obstructive pulmonary disease)   . Depression   . Thyroid disease   . PMR (polymyalgia rheumatica)     with h/o steroid induced myopathy  . Anxiety   . Diastolic dysfunction  Echo 05/11/2010, EF 65-70%  . TIA (transient ischemic attack)     11/05 s/p L CEA  . Kidney infection   . Carotid artery occlusion   . CAD (coronary artery disease)     h/o NSTEMI, LAD stent placement 10/04, cath 05/11/2010  . HLD (hyperlipidemia)     Past Surgical History  Procedure Laterality Date  . Cholecystectomy    . Carotid endarterectomy      left with stent placement 11/2003  . Iliac artery stent      right, 2001  . Cardiac catheterization      unc  . Cardiac catheterization    . Cardiac catheterization    . Lung biopsy      History reviewed. No pertinent family history.  History   Social History  . Marital Status: Married    Spouse Name: N/A  . Number of Children: N/A  . Years of Education: N/A   Occupational History  . retired      Pharmacist, hospital   Social History Main Topics  . Smoking status: Former Smoker -- 1.00 packs/day for 25 years    Types: Cigarettes    Quit date: 01/05/1996  . Smokeless tobacco: Never Used  . Alcohol Use: No  . Drug Use: No  . Sexual Activity: Not on file   Other Topics Concern  . Not on file   Social History Narrative   Lives with husband, [name] at [place].   Daughters, [name] and [name] very involved.   The PMH, PSH, Social History, Family History, Medications, and allergies have been reviewed in [facility], and have been updated if relevant.   Review of Systems  Constitutional: Positive for fatigue.  Respiratory: Negative.   Cardiovascular: Positive for chest pain.  Gastrointestinal: Negative.   Genitourinary: Positive for vaginal discharge. Negative for vaginal bleeding and vaginal pain.  Musculoskeletal: Negative.   Skin: Negative.   Neurological: Positive for dizziness, tremors and weakness. Negative for seizures, syncope, facial asymmetry, speech difficulty, light-headedness, numbness and headaches.  Psychiatric/Behavioral: Negative.   All other systems reviewed and are negative.      Objective:    BP 128/74 mmHg  Pulse 41  Temp(Src) 98.5 F (36.9 C) (Oral)  Wt 201 lb 4 oz (91.286 kg)  SpO2 93%   Physical Exam  Constitutional: She appears well-nourished. No distress.  HENT:  Head: Normocephalic.  Eyes: Conjunctivae are normal.  Cardiovascular: Normal rate.   Pulmonary/Chest: Effort normal.  Genitourinary:  Pt deferred exam  Musculoskeletal: Normal range of motion. She exhibits no edema.  Skin: Skin is warm and dry.  Psychiatric: She has a normal mood and affect. Her behavior is normal. Judgment and thought content normal.  Nursing note and vitals reviewed.         Assessment & Plan:   Dizziness and giddiness No Follow-up on file.

## 2014-06-11 NOTE — Assessment & Plan Note (Signed)
Reviewed cr and GFRs going back to 2012 with pt and her daughter- yes kidney function has decreased but still remaining in range.  They want to defer renal consult and push fluids.  Recheck cr/gfr in 1-2 months. The patient indicates understanding of these issues and agrees with the plan.  Lab Results  Component Value Date   CREATININE 1.21* 05/08/2014

## 2014-06-11 NOTE — Assessment & Plan Note (Signed)
New- bradycardic here.  Advised to cut propranolol dose in half to 30 mg daily and [name] will ask CMA to check her pulse and keep me updated with her symptoms. The patient indicates understanding of these issues and agrees with the plan.

## 2014-06-14 ENCOUNTER — Emergency Department
Admission: EM | Admit: 2014-06-14 | Discharge: 2014-06-14 | Disposition: A | Discharge status: Home / Self Care | Payer: Medicare Other | Attending: Emergency Medicine | Admitting: Emergency Medicine

## 2014-06-14 ENCOUNTER — Encounter: Payer: Self-pay | Admitting: Emergency Medicine

## 2014-06-14 ENCOUNTER — Emergency Department: Payer: Medicare Other

## 2014-06-14 DIAGNOSIS — Z79899 Other long term (current) drug therapy: Secondary | ICD-10-CM | POA: Insufficient documentation

## 2014-06-14 DIAGNOSIS — R531 Weakness: Secondary | ICD-10-CM | POA: Diagnosis not present

## 2014-06-14 DIAGNOSIS — I129 Hypertensive chronic kidney disease with stage 1 through stage 4 chronic kidney disease, or unspecified chronic kidney disease: Secondary | ICD-10-CM | POA: Insufficient documentation

## 2014-06-14 DIAGNOSIS — R0602 Shortness of breath: Secondary | ICD-10-CM | POA: Diagnosis not present

## 2014-06-14 DIAGNOSIS — Z7902 Long term (current) use of antithrombotics/antiplatelets: Secondary | ICD-10-CM | POA: Diagnosis not present

## 2014-06-14 DIAGNOSIS — M6281 Muscle weakness (generalized): Secondary | ICD-10-CM | POA: Insufficient documentation

## 2014-06-14 DIAGNOSIS — Z87891 Personal history of nicotine dependence: Secondary | ICD-10-CM | POA: Diagnosis not present

## 2014-06-14 DIAGNOSIS — N183 Chronic kidney disease, stage 3 (moderate): Secondary | ICD-10-CM | POA: Insufficient documentation

## 2014-06-14 DIAGNOSIS — M25511 Pain in right shoulder: Secondary | ICD-10-CM | POA: Diagnosis not present

## 2014-06-14 DIAGNOSIS — S0990XA Unspecified injury of head, initial encounter: Secondary | ICD-10-CM | POA: Diagnosis not present

## 2014-06-14 LAB — BASIC METABOLIC PANEL
ANION GAP: 9 (ref 5–15)
BUN: 35 mg/dL — AB (ref 6–20)
CALCIUM: 8.8 mg/dL — AB (ref 8.9–10.3)
CO2: 28 mmol/L (ref 22–32)
Chloride: 107 mmol/L (ref 101–111)
Creatinine, Ser: 1.24 mg/dL — ABNORMAL HIGH (ref 0.44–1.00)
GFR calc non Af Amer: 40 mL/min — ABNORMAL LOW (ref >60)
GFR, EST AFRICAN AMERICAN: 46 mL/min — AB (ref >60)
GLUCOSE: 114 mg/dL — AB (ref 65–99)
Potassium: 4.8 mmol/L (ref 3.5–5.1)
SODIUM: 144 mmol/L (ref 135–145)

## 2014-06-14 LAB — BRAIN NATRIURETIC PEPTIDE: B Natriuretic Peptide: 400 pg/mL — ABNORMAL HIGH (ref 0.0–100.0)

## 2014-06-14 LAB — URINALYSIS COMPLETE WITH MICROSCOPIC (ARMC ONLY)
BILIRUBIN URINE: NEGATIVE
Glucose, UA: NEGATIVE mg/dL
Hgb urine dipstick: NEGATIVE
Ketones, ur: NEGATIVE mg/dL
Nitrite: NEGATIVE
PH: 5 (ref 5.0–8.0)
PROTEIN: NEGATIVE mg/dL
Specific Gravity, Urine: 1.021 (ref 1.005–1.030)

## 2014-06-14 LAB — CBC
HEMATOCRIT: 35 % (ref 35.0–47.0)
Hemoglobin: 11.6 g/dL — ABNORMAL LOW (ref 12.0–16.0)
MCH: 29.2 pg (ref 26.0–34.0)
MCHC: 33.1 g/dL (ref 32.0–36.0)
MCV: 88.3 fL (ref 80.0–100.0)
Platelets: 174 10*3/uL (ref 150–440)
RBC: 3.96 MIL/uL (ref 3.80–5.20)
RDW: 17.8 % — AB (ref 11.5–14.5)
WBC: 6.7 10*3/uL (ref 3.6–11.0)

## 2014-06-14 LAB — TROPONIN I

## 2014-06-14 NOTE — Discharge Instructions (Signed)
Weakness Weakness is a lack of strength. You may feel weak all over your body or just in one part of your body. Weakness can be serious. In some cases, you may need more medical tests. HOME [place] a well-balanced diet.  Try to exercise every day.  Only take medicines as told by your doctor. GET HELP RIGHT AWAY IF:   You cannot do your normal daily activities.  You cannot walk up and down stairs, or you feel very tired when you do so.  You have shortness of breath or chest pain.  You have trouble moving parts of your body.  You have weakness in only one body part or on only one side of the body.  You have a fever.  You have trouble speaking or swallowing.  You cannot control when you pee (urinate) or poop (bowel movement).  You have black or bloody throw up (vomit) or poop.  Your weakness gets worse or spreads to other body parts.  You have new aches or pains. MAKE SURE YOU:   Understand these instructions.  Will watch your condition.  Will get help right away if you are not doing well or get worse. Document Released: 12/04/2007 Document Revised: 06/22/2011 Document Reviewed: 02/19/2011 [facility] Patient Information 2015 [place], [place]. This information is not intended to replace advice given to you by your health care provider. Make sure you discuss any questions you have with your health care provider.   You have been seen in the emergency department today for weakness and right shoulder pain. Your workup today has shown normal results. As we have discussed we have recommended that you follow-up with your primary care doctor or cardiologist to discuss possibly placing a Holter monitor to further workup your low heart rate at times. Return to the emergency department if you have any chest pain, pressure, tightness, or any other personally concerning symptoms.

## 2014-06-14 NOTE — ED Notes (Signed)
Discussed need for urine sample with patient. Patient denies need to void at this time. Encouraged patient to notify nurse when able to provide sample. Patient verbalized understanding.

## 2014-06-14 NOTE — ED Provider Notes (Signed)
[facility] Emergency Department Provider Note  Time seen: 9:32 PM  I have reviewed the triage vital signs and the nursing notes.   HISTORY  Chief Complaint Weakness    HPI Joy [name] is a 81 y.o. female with a past medical history of hypertension, COPD, depression, TIA, hyperlipidemia who presents to the emergency department with generalized weakness and right shoulder pain. According to the patient she has been complaining of some right shoulder pain today, especially when attempting to close her right hand or use the right hand. Describes the pain as severe when it happens, and a sharp shooting nature down the right arm. No pain at rest or when not using the arm. Denies any weakness of the arm. Denies any headache, confusion, slurred speech, or any other weakness numbness of any extremity. Patient is also noted she has been more fatigued than normal today. She lives at twin [place] home, and the primary doctor center to the emergency department tonight for evaluation. Patient currently here with her daughter. Denies any symptoms at this time. Denies ever having chest pain or shortness of breath.    Past Medical History  Diagnosis Date  . HTN (hypertension)   . COPD (chronic obstructive pulmonary disease)   . Depression   . Thyroid disease   . PMR (polymyalgia rheumatica)     with h/o steroid induced myopathy  . Anxiety   . Diastolic dysfunction     Echo 05/11/2010, EF 65-70%  . TIA (transient ischemic attack)     11/05 s/p L CEA  . Kidney infection   . Carotid artery occlusion   . CAD (coronary artery disease)     h/o NSTEMI, LAD stent placement 10/04, cath 05/11/2010  . HLD (hyperlipidemia)     Patient Active Problem List   Diagnosis Date Noted  . Dizziness and giddiness 06/11/2014  . Vaginitis and vulvovaginitis 06/11/2014  . CKD (chronic kidney disease) stage 3, GFR 30-59 ml/min 06/11/2014  . Fall at home 05/08/2014  . Fatigue  05/08/2014  . Carotid artery disease 04/08/2014  . Nevus of cheek 10/30/2013  . CAD (coronary artery disease)   . HLD (hyperlipidemia)   . Lung nodule 01/15/2013  . Memory loss 01/15/2013  . Decreased appetite 01/15/2013  . Weakness generalized 12/26/2012  . Incontinence 12/26/2012  . Dysuria 12/01/2012  . Yeast vaginitis 12/01/2012  . Vulvar irritation 09/15/2012  . GERD (gastroesophageal reflux disease) 09/06/2012  . Neuropathy 01/14/2011  . Insomnia 01/14/2011  . 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 HTN (hypertension)   . COPD (chronic obstructive pulmonary disease)   . Depression   . Thyroid disease     Past Surgical History  Procedure Laterality Date  . Cholecystectomy    . Carotid endarterectomy      left with stent placement 11/2003  . Iliac artery stent      right, 2001  . Cardiac catheterization      [facility]  . Cardiac catheterization    . Cardiac catheterization    . Lung biopsy      Current Outpatient Rx  Name  Route  Sig  Dispense  Refill  . ADVAIR DISKUS 250-50 MCG/DOSE AEPB      INHALE 1 PUFF BY MOUTH TWICE A DAY   60 each   5   . albuterol (PROVENTIL HFA;VENTOLIN HFA) 108 (90 BASE) MCG/ACT inhaler   Inhalation   Inhale 2 puffs into the lungs every 6 (six) hours as needed  for wheezing.   1 Inhaler   0   . amLODipine (NORVASC) 5 MG tablet      TAKE 1 TABLET BY MOUTH EVERY DAY   90 tablet   0   . amLODipine (NORVASC) 5 MG tablet      TAKE 1 TABLET BY MOUTH EVERY DAY   90 tablet   0   . aspirin EC 81 MG tablet   Oral   Take 81 mg by mouth daily.           Marland Kitchen atorvastatin (LIPITOR) 40 MG tablet   Oral   Take 1 tablet (40 mg total) by mouth daily.   90 tablet   1   . clopidogrel (PLAVIX) 75 MG tablet      TAKE 1 TABLET BY MOUTH EVERY DAY   30 tablet   5   . gabapentin (NEURONTIN) 800 MG tablet   Oral   Take 1 tablet (800 mg total) by mouth 2 (two) times daily.   60 tablet    3   . levothyroxine (SYNTHROID, LEVOTHROID) 25 MCG tablet      TAKE 1 TABLET BY MOUTH EVERY DAY   30 tablet   3   . lisinopril (PRINIVIL,ZESTRIL) 10 MG tablet      TAKE 2 TABLETS (20 MG TOTAL) BY MOUTH DAILY.   60 tablet   5   . nitroGLYCERIN (NITROSTAT) 0.4 MG SL tablet   Sublingual   Place 0.4 mg under the tongue every 5 (five) minutes as needed.           . propranolol (INDERAL) 60 MG tablet   Oral   Take 30 mg by mouth daily.         . ranitidine (ZANTAC) 300 MG tablet      TAKE 1 TABLET (300 MG TOTAL) BY MOUTH AT BEDTIME.   90 tablet   1   . sertraline (ZOLOFT) 100 MG tablet      TAKE 1 TABLET BY MOUTH EVERY DAY   30 tablet   5   . terconazole (TERAZOL 3) 0.8 % vaginal cream   Vaginal   Place 1 applicator vaginally at bedtime.   20 g   0   . tiotropium (SPIRIVA HANDIHALER) 18 MCG inhalation capsule   Inhalation   Place 1 capsule (18 mcg total) into inhaler and inhale daily.   90 capsule   2   . traZODone (DESYREL) 50 MG tablet      TAKE 1/2 TO 1 TABLET AT BEDTIME AS NEEDED FOR SLEEP   30 tablet   2   . vitamin B-12 (CYANOCOBALAMIN) 1000 MCG tablet   Oral   Take 1,000 mcg by mouth daily.         . Vitamin D, Ergocalciferol, (DRISDOL) 50000 UNITS CAPS capsule      TAKE 1 CAPSULE (50,000 UNITS TOTAL) BY MOUTH EVERY 7 (SEVEN) DAYS.   6 capsule   3     Allergies Codeine; Prednisone; and Shellfish allergy  History reviewed. No pertinent family history.  Social History History  Substance Use Topics  . Smoking status: Former Smoker -- 1.00 packs/day for 25 years    Types: Cigarettes    Quit date: 01/05/1996  . Smokeless tobacco: Never Used  . Alcohol Use: No    Review of Systems Constitutional: Negative for fever. Cardiovascular: Negative for chest pain. Respiratory: Negative for shortness of breath. Gastrointestinal: Negative for abdominal pain, vomiting and diarrhea. Genitourinary: Negative for dysuria. Musculoskeletal:  Negative  for back pain. As it for right shoulder pain, worse with movement. Neurological: Negative for headaches, focal weakness or numbness.  10-point ROS otherwise negative.  ____________________________________________   PHYSICAL EXAM:  VITAL SIGNS: ED Triage Vitals  Enc Vitals Group     BP 06/14/14 1658 151/107 mmHg     Pulse Rate 06/14/14 1658 48     Resp 06/14/14 1658 15     Temp 06/14/14 1658 98.3 F (36.8 C)     Temp Source 06/14/14 1658 Oral     SpO2 06/14/14 1658 91 %     Weight 06/14/14 1658 200 lb (90.719 kg)     Height 06/14/14 1658 5\' 3"  (1.6 m)     Head Cir --      Peak Flow --      Pain Score 06/14/14 1702 0     Pain Loc --      Pain Edu? --      Excl. in [place]? --     Constitutional: Alert and oriented. Well appearing and in no distress. Eyes: Normal exam ENT   Head: Normocephalic and atraumatic.   Mouth/Throat: Mucous membranes are moist. Cardiovascular: Normal rate, regular rhythm. No murmur Respiratory: Normal respiratory effort without tachypnea nor retractions. Breath sounds are clear  Gastrointestinal: Soft and nontender. No distention.   Musculoskeletal: Moderate tenderness to scapula palpation, normal range of motion without difficulty, neurovascularly intact. Normal strength. Neurologic:  Normal speech and language, equal grip strengths bilaterally, no focal deficits identified on exam. Skin:  Skin is warm, dry and intact.  Psychiatric: Mood and affect are normal. Speech and behavior are normal.  ____________________________________________    EKG  EKG reviewed and interpreted by myself shows sinus bradycardia at 49 bpm, narrow QRS, normal axis, normal intervals, no ST changes noted.  ____________________________________________    RADIOLOGY  CT head shows no acute abnormalities.  ____________________________________________    INITIAL IMPRESSION / ASSESSMENT AND PLAN / ED COURSE  Pertinent labs & imaging results that were  available during my care of the patient were reviewed by me and considered in my medical decision making (see chart for details).  Patient with 1 day weakness, and intermittent right shoulder pain. No neurologic deficit on exam. Shoulder pain is reproducible with palpation over the scapula, likely musculoskeletal in nature. Labs are within normal limits. We will CT head given the patient's recent falls, which the daughter states her fairly normal for the patient unfortunately. If CT within normal limits we will discharge home with primary care follow-up. Patient agreeable to plan.  CT shows no acute abnormalities. I discussed with the patient follow up with her primary care doctor for possible Holter monitor as the daughter states occasionally her heart rate will drop as low as 40 bpm. Her heart rate has been maintained in the upper 40s to mid 50s in the ER, and appears to be normal sinus. Patient daughter are agreeable to this plan we'll discharge home.  ____________________________________________   FINAL CLINICAL IMPRESSION(S) / ED DIAGNOSES  Weakness Right shoulder pain   Harvest Dark, MD 06/14/14 2308

## 2014-06-14 NOTE — ED Notes (Addendum)
Patient started on propanolol ER on 6/3. Patient's HR was 41 in PCP office so they dc-ed propanolol. Last dose 6/7. Patient presents today stating she feels weak. Patient's daughter states that she has been falling. Patient wears 2L o2 chronically.

## 2014-06-14 NOTE — ED Notes (Signed)
Patient also admits to hx of CHF, recent DOE, and some recent pedal edema

## 2014-06-14 NOTE — ED Notes (Signed)
MD at bedside. 

## 2014-06-18 ENCOUNTER — Other Ambulatory Visit: Payer: Self-pay | Admitting: Family Medicine

## 2014-06-27 DIAGNOSIS — G311 Senile degeneration of brain, not elsewhere classified: Secondary | ICD-10-CM | POA: Diagnosis not present

## 2014-06-27 DIAGNOSIS — F0390 Unspecified dementia without behavioral disturbance: Secondary | ICD-10-CM | POA: Diagnosis not present

## 2014-06-27 DIAGNOSIS — G609 Hereditary and idiopathic neuropathy, unspecified: Secondary | ICD-10-CM | POA: Diagnosis not present

## 2014-06-27 DIAGNOSIS — R412 Retrograde amnesia: Secondary | ICD-10-CM | POA: Diagnosis not present

## 2014-06-27 DIAGNOSIS — R413 Other amnesia: Secondary | ICD-10-CM | POA: Diagnosis not present

## 2014-06-27 DIAGNOSIS — G250 Essential tremor: Secondary | ICD-10-CM | POA: Diagnosis not present

## 2014-06-29 DIAGNOSIS — I43 Cardiomyopathy in diseases classified elsewhere: Secondary | ICD-10-CM | POA: Diagnosis not present

## 2014-06-29 DIAGNOSIS — I517 Cardiomegaly: Secondary | ICD-10-CM | POA: Diagnosis not present

## 2014-06-29 DIAGNOSIS — G629 Polyneuropathy, unspecified: Secondary | ICD-10-CM | POA: Diagnosis not present

## 2014-06-29 DIAGNOSIS — R531 Weakness: Secondary | ICD-10-CM | POA: Diagnosis not present

## 2014-06-29 DIAGNOSIS — R251 Tremor, unspecified: Secondary | ICD-10-CM | POA: Diagnosis not present

## 2014-06-29 DIAGNOSIS — J449 Chronic obstructive pulmonary disease, unspecified: Secondary | ICD-10-CM | POA: Diagnosis not present

## 2014-06-29 DIAGNOSIS — R918 Other nonspecific abnormal finding of lung field: Secondary | ICD-10-CM | POA: Diagnosis not present

## 2014-06-29 DIAGNOSIS — R0602 Shortness of breath: Secondary | ICD-10-CM | POA: Diagnosis not present

## 2014-06-29 DIAGNOSIS — J90 Pleural effusion, not elsewhere classified: Secondary | ICD-10-CM | POA: Diagnosis not present

## 2014-06-29 DIAGNOSIS — I214 Non-ST elevation (NSTEMI) myocardial infarction: Secondary | ICD-10-CM | POA: Diagnosis not present

## 2014-06-29 DIAGNOSIS — I110 Hypertensive heart disease with heart failure: Secondary | ICD-10-CM | POA: Diagnosis not present

## 2014-06-29 DIAGNOSIS — I5033 Acute on chronic diastolic (congestive) heart failure: Secondary | ICD-10-CM | POA: Diagnosis not present

## 2014-06-29 DIAGNOSIS — M6281 Muscle weakness (generalized): Secondary | ICD-10-CM | POA: Diagnosis not present

## 2014-06-30 DIAGNOSIS — Z87891 Personal history of nicotine dependence: Secondary | ICD-10-CM | POA: Diagnosis not present

## 2014-06-30 DIAGNOSIS — R413 Other amnesia: Secondary | ICD-10-CM | POA: Diagnosis present

## 2014-06-30 DIAGNOSIS — I2510 Atherosclerotic heart disease of native coronary artery without angina pectoris: Secondary | ICD-10-CM | POA: Diagnosis present

## 2014-06-30 DIAGNOSIS — I214 Non-ST elevation (NSTEMI) myocardial infarction: Secondary | ICD-10-CM | POA: Diagnosis present

## 2014-06-30 DIAGNOSIS — R918 Other nonspecific abnormal finding of lung field: Secondary | ICD-10-CM | POA: Diagnosis not present

## 2014-06-30 DIAGNOSIS — Z9861 Coronary angioplasty status: Secondary | ICD-10-CM | POA: Diagnosis not present

## 2014-06-30 DIAGNOSIS — K219 Gastro-esophageal reflux disease without esophagitis: Secondary | ICD-10-CM | POA: Diagnosis present

## 2014-06-30 DIAGNOSIS — I10 Essential (primary) hypertension: Secondary | ICD-10-CM | POA: Diagnosis not present

## 2014-06-30 DIAGNOSIS — M353 Polymyalgia rheumatica: Secondary | ICD-10-CM | POA: Diagnosis present

## 2014-06-30 DIAGNOSIS — J810 Acute pulmonary edema: Secondary | ICD-10-CM | POA: Insufficient documentation

## 2014-06-30 DIAGNOSIS — J80 Acute respiratory distress syndrome: Secondary | ICD-10-CM | POA: Diagnosis not present

## 2014-06-30 DIAGNOSIS — G629 Polyneuropathy, unspecified: Secondary | ICD-10-CM | POA: Diagnosis present

## 2014-06-30 DIAGNOSIS — Z8673 Personal history of transient ischemic attack (TIA), and cerebral infarction without residual deficits: Secondary | ICD-10-CM | POA: Diagnosis not present

## 2014-06-30 DIAGNOSIS — D649 Anemia, unspecified: Secondary | ICD-10-CM | POA: Diagnosis present

## 2014-06-30 DIAGNOSIS — R251 Tremor, unspecified: Secondary | ICD-10-CM | POA: Diagnosis present

## 2014-06-30 DIAGNOSIS — G4700 Insomnia, unspecified: Secondary | ICD-10-CM | POA: Diagnosis present

## 2014-06-30 DIAGNOSIS — I252 Old myocardial infarction: Secondary | ICD-10-CM | POA: Diagnosis not present

## 2014-06-30 DIAGNOSIS — I5033 Acute on chronic diastolic (congestive) heart failure: Secondary | ICD-10-CM | POA: Diagnosis present

## 2014-06-30 DIAGNOSIS — M6281 Muscle weakness (generalized): Secondary | ICD-10-CM | POA: Diagnosis not present

## 2014-06-30 DIAGNOSIS — F329 Major depressive disorder, single episode, unspecified: Secondary | ICD-10-CM | POA: Diagnosis present

## 2014-06-30 DIAGNOSIS — I110 Hypertensive heart disease with heart failure: Secondary | ICD-10-CM | POA: Diagnosis present

## 2014-06-30 DIAGNOSIS — I43 Cardiomyopathy in diseases classified elsewhere: Secondary | ICD-10-CM | POA: Diagnosis present

## 2014-06-30 DIAGNOSIS — E039 Hypothyroidism, unspecified: Secondary | ICD-10-CM | POA: Diagnosis present

## 2014-06-30 DIAGNOSIS — E785 Hyperlipidemia, unspecified: Secondary | ICD-10-CM | POA: Diagnosis present

## 2014-06-30 DIAGNOSIS — J449 Chronic obstructive pulmonary disease, unspecified: Secondary | ICD-10-CM | POA: Diagnosis present

## 2014-06-30 DIAGNOSIS — I161 Hypertensive emergency: Secondary | ICD-10-CM | POA: Insufficient documentation

## 2014-06-30 DIAGNOSIS — R0600 Dyspnea, unspecified: Secondary | ICD-10-CM | POA: Diagnosis not present

## 2014-07-01 ENCOUNTER — Other Ambulatory Visit: Payer: Self-pay | Admitting: Family Medicine

## 2014-07-03 DIAGNOSIS — J449 Chronic obstructive pulmonary disease, unspecified: Secondary | ICD-10-CM | POA: Diagnosis not present

## 2014-07-03 DIAGNOSIS — I279 Pulmonary heart disease, unspecified: Secondary | ICD-10-CM | POA: Diagnosis not present

## 2014-07-03 DIAGNOSIS — R262 Difficulty in walking, not elsewhere classified: Secondary | ICD-10-CM | POA: Diagnosis not present

## 2014-07-03 DIAGNOSIS — J9622 Acute and chronic respiratory failure with hypercapnia: Secondary | ICD-10-CM | POA: Diagnosis not present

## 2014-07-03 DIAGNOSIS — J810 Acute pulmonary edema: Secondary | ICD-10-CM | POA: Diagnosis not present

## 2014-07-09 DIAGNOSIS — R419 Unspecified symptoms and signs involving cognitive functions and awareness: Secondary | ICD-10-CM | POA: Diagnosis not present

## 2014-07-09 DIAGNOSIS — M6281 Muscle weakness (generalized): Secondary | ICD-10-CM | POA: Diagnosis not present

## 2014-07-09 DIAGNOSIS — I509 Heart failure, unspecified: Secondary | ICD-10-CM | POA: Diagnosis not present

## 2014-07-09 DIAGNOSIS — R279 Unspecified lack of coordination: Secondary | ICD-10-CM | POA: Diagnosis not present

## 2014-07-11 DIAGNOSIS — R279 Unspecified lack of coordination: Secondary | ICD-10-CM | POA: Diagnosis not present

## 2014-07-11 DIAGNOSIS — M6281 Muscle weakness (generalized): Secondary | ICD-10-CM | POA: Diagnosis not present

## 2014-07-11 DIAGNOSIS — R419 Unspecified symptoms and signs involving cognitive functions and awareness: Secondary | ICD-10-CM | POA: Diagnosis not present

## 2014-07-11 DIAGNOSIS — I509 Heart failure, unspecified: Secondary | ICD-10-CM | POA: Diagnosis not present

## 2014-07-17 DIAGNOSIS — R279 Unspecified lack of coordination: Secondary | ICD-10-CM | POA: Diagnosis not present

## 2014-07-17 DIAGNOSIS — R419 Unspecified symptoms and signs involving cognitive functions and awareness: Secondary | ICD-10-CM | POA: Diagnosis not present

## 2014-07-17 DIAGNOSIS — M6281 Muscle weakness (generalized): Secondary | ICD-10-CM | POA: Diagnosis not present

## 2014-07-17 DIAGNOSIS — I509 Heart failure, unspecified: Secondary | ICD-10-CM | POA: Diagnosis not present

## 2014-07-19 DIAGNOSIS — M6281 Muscle weakness (generalized): Secondary | ICD-10-CM | POA: Diagnosis not present

## 2014-07-19 DIAGNOSIS — I509 Heart failure, unspecified: Secondary | ICD-10-CM | POA: Diagnosis not present

## 2014-07-19 DIAGNOSIS — R419 Unspecified symptoms and signs involving cognitive functions and awareness: Secondary | ICD-10-CM | POA: Diagnosis not present

## 2014-07-19 DIAGNOSIS — R279 Unspecified lack of coordination: Secondary | ICD-10-CM | POA: Diagnosis not present

## 2014-07-22 ENCOUNTER — Ambulatory Visit (INDEPENDENT_AMBULATORY_CARE_PROVIDER_SITE_OTHER): Payer: Medicare Other | Admitting: Cardiovascular Disease

## 2014-07-22 ENCOUNTER — Encounter: Payer: Self-pay | Admitting: Cardiovascular Disease

## 2014-07-22 VITALS — BP 121/70 | HR 78 | Ht 64.0 in | Wt 196.2 lb

## 2014-07-22 DIAGNOSIS — I2510 Atherosclerotic heart disease of native coronary artery without angina pectoris: Secondary | ICD-10-CM | POA: Diagnosis not present

## 2014-07-22 DIAGNOSIS — I10 Essential (primary) hypertension: Secondary | ICD-10-CM

## 2014-07-22 DIAGNOSIS — I5032 Chronic diastolic (congestive) heart failure: Secondary | ICD-10-CM | POA: Diagnosis not present

## 2014-07-22 DIAGNOSIS — I779 Disorder of arteries and arterioles, unspecified: Secondary | ICD-10-CM | POA: Diagnosis not present

## 2014-07-22 DIAGNOSIS — I6523 Occlusion and stenosis of bilateral carotid arteries: Secondary | ICD-10-CM | POA: Diagnosis not present

## 2014-07-22 DIAGNOSIS — I739 Peripheral vascular disease, unspecified: Secondary | ICD-10-CM

## 2014-07-22 NOTE — Assessment & Plan Note (Signed)
The patient had recent admission for flash pulmonary edema of unclear etiology. There was no evidence of cardiac ischemia. She has no symptoms of angina. There has been gradual decline in renal function over the last year. Thus, I think it's important to exclude significant renal artery stenosis as the culprit. I requested renal artery duplex. She seems to be euvolemic on current dose of furosemide. I requested basic metabolic profile.

## 2014-07-22 NOTE — Assessment & Plan Note (Signed)
She has no symptoms of angina. Continue medical therapy. 

## 2014-07-22 NOTE — Patient Instructions (Signed)
Medication Instructions:  Your physician recommends that you continue on your current medications as directed. Please refer to the Current Medication list given to you today.   Labwork: Your physician recommends that you have labs today: BMET   Testing/Procedures: Your physician has requested that you have a renal artery duplex. During this test, an ultrasound is used to evaluate blood flow to the kidneys. Allow one hour for this exam. Do not eat after midnight the day before and avoid carbonated beverages. Take your medications as you usually do.    Follow-Up: Your physician recommends that you schedule a follow-up appointment in: two months with Dr. Fletcher Anon.    Any Other Special Instructions Will Be Listed Below (If Applicable).

## 2014-07-22 NOTE — Assessment & Plan Note (Signed)
Most recent carotid Doppler in April showed 40-59% right ICA stenosis. Repeat in one year.

## 2014-07-22 NOTE — Assessment & Plan Note (Signed)
Blood pressure is now well controlled on current medication.

## 2014-07-22 NOTE — Progress Notes (Signed)
Primary care physician: Dr. Deborra Medina  HPI  This is a pleasant 81 year old female who is here today for followup visit regarding coronary artery disease. She has known history of coronary artery disease with previous LAD stent in 2004 at [facility], vascular dementia, CVA, carotid artery stenting, peripheral arterial disease, hypertension and hyperlipidemia. She was hospitalized briefly at [facility] with atypical chest pain.  She had a previous echocardiogram in 2011 which showed normal LV systolic function.  She underwent an outpatient nuclear stress test in 02/2013 which was normal. Metoprolol was discontinued due to bradycardia. She lives at twin [place] with her husband. She was noted to be stable during her last office visit with controlled blood pressure. However, she was hospitalized a few weeks ago at [facility] for hypertensive urgency in flash pulmonary edema. This was somewhat surprising given that her blood pressure has been controlled. She had an echocardiogram done there which showed normal LV systolic function. There has been some gradual decline in her kidney function over the last year. She was placed on furosemide 40 mg once daily. The dose of amlodipine was increased to 10 mg once daily. She seems to be doing better and her weight has been stable. Her dementia seems to be getting worse.  Allergies  Allergen Reactions  . Codeine Other (See Comments)    Coma for 2 days  . Prednisone Other (See Comments)    Weight gain,puffy face  . Shellfish Allergy      Current Outpatient Prescriptions on File Prior to Visit  Medication Sig Dispense Refill  . ADVAIR DISKUS 250-50 MCG/DOSE AEPB INHALE 1 PUFF BY MOUTH TWICE A DAY 60 each 5  . albuterol (PROVENTIL HFA;VENTOLIN HFA) 108 (90 BASE) MCG/ACT inhaler Inhale 2 puffs into the lungs every 6 (six) hours as needed for wheezing. 1 Inhaler 0  . aspirin EC 81 MG tablet Take 81 mg by mouth daily.      Marland Kitchen atorvastatin (LIPITOR) 40 MG tablet Take 1 tablet  (40 mg total) by mouth daily. 90 tablet 1  . clopidogrel (PLAVIX) 75 MG tablet TAKE 1 TABLET BY MOUTH EVERY DAY 30 tablet 5  . gabapentin (NEURONTIN) 800 MG tablet TAKE 1 TABLET BY MOUTH TWICE A DAY 60 tablet 3  . levothyroxine (SYNTHROID, LEVOTHROID) 25 MCG tablet TAKE 1 TABLET BY MOUTH EVERY DAY 30 tablet 3  . lisinopril (PRINIVIL,ZESTRIL) 10 MG tablet TAKE 2 TABLETS (20 MG TOTAL) BY MOUTH DAILY. 60 tablet 5  . nitroGLYCERIN (NITROSTAT) 0.4 MG SL tablet Place 0.4 mg under the tongue every 5 (five) minutes as needed.      . ranitidine (ZANTAC) 300 MG tablet TAKE 1 TABLET (300 MG TOTAL) BY MOUTH AT BEDTIME. 90 tablet 1  . sertraline (ZOLOFT) 100 MG tablet TAKE 1 TABLET BY MOUTH EVERY DAY 30 tablet 5  . terconazole (TERAZOL 3) 0.8 % vaginal cream Place 1 applicator vaginally at bedtime. 20 g 0  . tiotropium (SPIRIVA HANDIHALER) 18 MCG inhalation capsule Place 1 capsule (18 mcg total) into inhaler and inhale daily. 90 capsule 2  . traZODone (DESYREL) 50 MG tablet TAKE 1/2 TO 1 TABLET AT BEDTIME AS NEEDED FOR SLEEP 30 tablet 1  . vitamin B-12 (CYANOCOBALAMIN) 1000 MCG tablet Take 1,000 mcg by mouth daily.    . Vitamin D, Ergocalciferol, (DRISDOL) 50000 UNITS CAPS capsule TAKE 1 CAPSULE (50,000 UNITS TOTAL) BY MOUTH EVERY 7 (SEVEN) DAYS. 6 capsule 3   No current facility-administered medications on file prior to visit.  Past Medical History  Diagnosis Date  . HTN (hypertension)   . COPD (chronic obstructive pulmonary disease)   . Depression   . Thyroid disease   . PMR (polymyalgia rheumatica)     with h/o steroid induced myopathy  . Anxiety   . Diastolic dysfunction     Echo 05/11/2010, EF 65-70%  . TIA (transient ischemic attack)     11/05 s/p L CEA  . Kidney infection   . Carotid artery occlusion   . CAD (coronary artery disease)     h/o NSTEMI, LAD stent placement 10/04, cath 05/11/2010  . HLD (hyperlipidemia)      Past Surgical History  Procedure Laterality Date  .  Cholecystectomy    . Carotid endarterectomy      left with stent placement 11/2003  . Iliac artery stent      right, 2001  . Cardiac catheterization      [facility]  . Cardiac catheterization    . Cardiac catheterization    . Lung biopsy       History reviewed. No pertinent family history.   History   Social History  . Marital Status: Married    Spouse Name: N/A  . Number of Children: N/A  . Years of Education: N/A   Occupational History  . retired     Pharmacist, hospital   Social History Main Topics  . Smoking status: Former Smoker -- 1.00 packs/day for 25 years    Types: Cigarettes    Quit date: 01/05/1996  . Smokeless tobacco: Never Used  . Alcohol Use: No  . Drug Use: No  . Sexual Activity: Not on file   Other Topics Concern  . Not on file   Social History Narrative   Lives with husband, [name] at [place].   Daughters, [name] and [name] very involved.     PHYSICAL EXAM   BP 121/70 mmHg  Pulse 78  Ht 5\' 4"  (1.626 m)  Wt 196 lb 4 oz (89.018 kg)  BMI 33.67 kg/m2 Constitutional: She is oriented to person, place, and time. She appears well-developed and well-nourished. No distress.  HENT: No nasal discharge.  Head: Normocephalic and atraumatic.  Eyes: Pupils are equal and round. No discharge.  Neck: Normal range of motion. Neck supple. No JVD present. No thyromegaly present.  Cardiovascular: Normal rate, regular rhythm, normal heart sounds. Exam reveals no gallop and no friction rub. No murmur heard.  Pulmonary/Chest: Effort normal and breath sounds normal. No stridor. No respiratory distress. She has no wheezes. She has no rales. She exhibits no tenderness.  Abdominal: Soft. Bowel sounds are normal. She exhibits no distension. There is no tenderness. There is no rebound and no guarding.  Musculoskeletal: Normal range of motion. She exhibits no edema and no tenderness.  Neurological: She is alert and oriented to person, place, and time. Coordination normal.  Skin: Skin is  warm and dry. No rash noted. She is not diaphoretic. No erythema. No pallor.  Psychiatric: She has a normal mood and affect. Her behavior is normal. Judgment and thought content normal.     EKG: Sinus  Rhythm  Low voltage in precordial leads.   -Prominent R(V1) -nonspecific.   ABNORMAL      ASSESSMENT AND PLAN

## 2014-07-23 ENCOUNTER — Other Ambulatory Visit: Payer: Self-pay | Admitting: Family Medicine

## 2014-07-23 DIAGNOSIS — I509 Heart failure, unspecified: Secondary | ICD-10-CM | POA: Diagnosis not present

## 2014-07-23 DIAGNOSIS — M6281 Muscle weakness (generalized): Secondary | ICD-10-CM | POA: Diagnosis not present

## 2014-07-23 DIAGNOSIS — R279 Unspecified lack of coordination: Secondary | ICD-10-CM | POA: Diagnosis not present

## 2014-07-23 DIAGNOSIS — R419 Unspecified symptoms and signs involving cognitive functions and awareness: Secondary | ICD-10-CM | POA: Diagnosis not present

## 2014-07-23 LAB — BASIC METABOLIC PANEL
BUN/Creatinine Ratio: 18 (ref 11–26)
BUN: 23 mg/dL (ref 8–27)
CHLORIDE: 98 mmol/L (ref 97–108)
CO2: 26 mmol/L (ref 18–29)
Calcium: 9.3 mg/dL (ref 8.7–10.3)
Creatinine, Ser: 1.28 mg/dL — ABNORMAL HIGH (ref 0.57–1.00)
GFR calc Af Amer: 45 mL/min/{1.73_m2} — ABNORMAL LOW (ref >59)
GFR, EST NON AFRICAN AMERICAN: 39 mL/min/{1.73_m2} — AB (ref >59)
GLUCOSE: 118 mg/dL — AB (ref 65–99)
POTASSIUM: 4.2 mmol/L (ref 3.5–5.2)
Sodium: 146 mmol/L — ABNORMAL HIGH (ref 134–144)

## 2014-07-24 ENCOUNTER — Other Ambulatory Visit: Payer: Self-pay | Admitting: Family Medicine

## 2014-07-24 DIAGNOSIS — I509 Heart failure, unspecified: Secondary | ICD-10-CM | POA: Diagnosis not present

## 2014-07-24 DIAGNOSIS — R419 Unspecified symptoms and signs involving cognitive functions and awareness: Secondary | ICD-10-CM | POA: Diagnosis not present

## 2014-07-24 DIAGNOSIS — M6281 Muscle weakness (generalized): Secondary | ICD-10-CM | POA: Diagnosis not present

## 2014-07-24 DIAGNOSIS — R279 Unspecified lack of coordination: Secondary | ICD-10-CM | POA: Diagnosis not present

## 2014-07-25 DIAGNOSIS — M6281 Muscle weakness (generalized): Secondary | ICD-10-CM | POA: Diagnosis not present

## 2014-07-25 DIAGNOSIS — R419 Unspecified symptoms and signs involving cognitive functions and awareness: Secondary | ICD-10-CM | POA: Diagnosis not present

## 2014-07-25 DIAGNOSIS — R279 Unspecified lack of coordination: Secondary | ICD-10-CM | POA: Diagnosis not present

## 2014-07-25 DIAGNOSIS — I509 Heart failure, unspecified: Secondary | ICD-10-CM | POA: Diagnosis not present

## 2014-07-26 DIAGNOSIS — G301 Alzheimer's disease with late onset: Secondary | ICD-10-CM | POA: Diagnosis not present

## 2014-07-26 DIAGNOSIS — F0280 Dementia in other diseases classified elsewhere without behavioral disturbance: Secondary | ICD-10-CM | POA: Diagnosis not present

## 2014-07-29 DIAGNOSIS — M6281 Muscle weakness (generalized): Secondary | ICD-10-CM | POA: Diagnosis not present

## 2014-07-29 DIAGNOSIS — R419 Unspecified symptoms and signs involving cognitive functions and awareness: Secondary | ICD-10-CM | POA: Diagnosis not present

## 2014-07-29 DIAGNOSIS — I509 Heart failure, unspecified: Secondary | ICD-10-CM | POA: Diagnosis not present

## 2014-07-29 DIAGNOSIS — R279 Unspecified lack of coordination: Secondary | ICD-10-CM | POA: Diagnosis not present

## 2014-07-30 DIAGNOSIS — R419 Unspecified symptoms and signs involving cognitive functions and awareness: Secondary | ICD-10-CM | POA: Diagnosis not present

## 2014-07-30 DIAGNOSIS — I509 Heart failure, unspecified: Secondary | ICD-10-CM | POA: Diagnosis not present

## 2014-07-30 DIAGNOSIS — M6281 Muscle weakness (generalized): Secondary | ICD-10-CM | POA: Diagnosis not present

## 2014-07-30 DIAGNOSIS — R279 Unspecified lack of coordination: Secondary | ICD-10-CM | POA: Diagnosis not present

## 2014-07-31 DIAGNOSIS — M6281 Muscle weakness (generalized): Secondary | ICD-10-CM | POA: Diagnosis not present

## 2014-07-31 DIAGNOSIS — R419 Unspecified symptoms and signs involving cognitive functions and awareness: Secondary | ICD-10-CM | POA: Diagnosis not present

## 2014-07-31 DIAGNOSIS — I509 Heart failure, unspecified: Secondary | ICD-10-CM | POA: Diagnosis not present

## 2014-07-31 DIAGNOSIS — R279 Unspecified lack of coordination: Secondary | ICD-10-CM | POA: Diagnosis not present

## 2014-08-01 DIAGNOSIS — R279 Unspecified lack of coordination: Secondary | ICD-10-CM | POA: Diagnosis not present

## 2014-08-01 DIAGNOSIS — I509 Heart failure, unspecified: Secondary | ICD-10-CM | POA: Diagnosis not present

## 2014-08-01 DIAGNOSIS — M6281 Muscle weakness (generalized): Secondary | ICD-10-CM | POA: Diagnosis not present

## 2014-08-01 DIAGNOSIS — R419 Unspecified symptoms and signs involving cognitive functions and awareness: Secondary | ICD-10-CM | POA: Diagnosis not present

## 2014-08-02 DIAGNOSIS — R279 Unspecified lack of coordination: Secondary | ICD-10-CM | POA: Diagnosis not present

## 2014-08-02 DIAGNOSIS — M6281 Muscle weakness (generalized): Secondary | ICD-10-CM | POA: Diagnosis not present

## 2014-08-02 DIAGNOSIS — I509 Heart failure, unspecified: Secondary | ICD-10-CM | POA: Diagnosis not present

## 2014-08-02 DIAGNOSIS — R419 Unspecified symptoms and signs involving cognitive functions and awareness: Secondary | ICD-10-CM | POA: Diagnosis not present

## 2014-08-06 DIAGNOSIS — I509 Heart failure, unspecified: Secondary | ICD-10-CM | POA: Diagnosis not present

## 2014-08-06 DIAGNOSIS — R262 Difficulty in walking, not elsewhere classified: Secondary | ICD-10-CM | POA: Diagnosis not present

## 2014-08-06 DIAGNOSIS — J810 Acute pulmonary edema: Secondary | ICD-10-CM | POA: Diagnosis not present

## 2014-08-06 DIAGNOSIS — M6281 Muscle weakness (generalized): Secondary | ICD-10-CM | POA: Diagnosis not present

## 2014-08-06 DIAGNOSIS — R419 Unspecified symptoms and signs involving cognitive functions and awareness: Secondary | ICD-10-CM | POA: Diagnosis not present

## 2014-08-06 DIAGNOSIS — R279 Unspecified lack of coordination: Secondary | ICD-10-CM | POA: Diagnosis not present

## 2014-08-07 DIAGNOSIS — J810 Acute pulmonary edema: Secondary | ICD-10-CM | POA: Diagnosis not present

## 2014-08-07 DIAGNOSIS — R262 Difficulty in walking, not elsewhere classified: Secondary | ICD-10-CM | POA: Diagnosis not present

## 2014-08-07 DIAGNOSIS — R419 Unspecified symptoms and signs involving cognitive functions and awareness: Secondary | ICD-10-CM | POA: Diagnosis not present

## 2014-08-07 DIAGNOSIS — I509 Heart failure, unspecified: Secondary | ICD-10-CM | POA: Diagnosis not present

## 2014-08-07 DIAGNOSIS — M6281 Muscle weakness (generalized): Secondary | ICD-10-CM | POA: Diagnosis not present

## 2014-08-07 DIAGNOSIS — R279 Unspecified lack of coordination: Secondary | ICD-10-CM | POA: Diagnosis not present

## 2014-08-08 ENCOUNTER — Ambulatory Visit (INDEPENDENT_AMBULATORY_CARE_PROVIDER_SITE_OTHER): Payer: Medicare Other

## 2014-08-08 DIAGNOSIS — I10 Essential (primary) hypertension: Secondary | ICD-10-CM | POA: Diagnosis not present

## 2014-08-09 DIAGNOSIS — G301 Alzheimer's disease with late onset: Secondary | ICD-10-CM | POA: Diagnosis not present

## 2014-08-09 DIAGNOSIS — F0280 Dementia in other diseases classified elsewhere without behavioral disturbance: Secondary | ICD-10-CM | POA: Diagnosis not present

## 2014-08-12 ENCOUNTER — Encounter: Payer: Self-pay | Admitting: Family Medicine

## 2014-08-13 DIAGNOSIS — R279 Unspecified lack of coordination: Secondary | ICD-10-CM | POA: Diagnosis not present

## 2014-08-13 DIAGNOSIS — R262 Difficulty in walking, not elsewhere classified: Secondary | ICD-10-CM | POA: Diagnosis not present

## 2014-08-13 DIAGNOSIS — J810 Acute pulmonary edema: Secondary | ICD-10-CM | POA: Diagnosis not present

## 2014-08-13 DIAGNOSIS — M6281 Muscle weakness (generalized): Secondary | ICD-10-CM | POA: Diagnosis not present

## 2014-08-13 DIAGNOSIS — R419 Unspecified symptoms and signs involving cognitive functions and awareness: Secondary | ICD-10-CM | POA: Diagnosis not present

## 2014-08-13 DIAGNOSIS — I509 Heart failure, unspecified: Secondary | ICD-10-CM | POA: Diagnosis not present

## 2014-08-14 DIAGNOSIS — R262 Difficulty in walking, not elsewhere classified: Secondary | ICD-10-CM | POA: Diagnosis not present

## 2014-08-14 DIAGNOSIS — M6281 Muscle weakness (generalized): Secondary | ICD-10-CM | POA: Diagnosis not present

## 2014-08-14 DIAGNOSIS — R419 Unspecified symptoms and signs involving cognitive functions and awareness: Secondary | ICD-10-CM | POA: Diagnosis not present

## 2014-08-14 DIAGNOSIS — R279 Unspecified lack of coordination: Secondary | ICD-10-CM | POA: Diagnosis not present

## 2014-08-14 DIAGNOSIS — J810 Acute pulmonary edema: Secondary | ICD-10-CM | POA: Diagnosis not present

## 2014-08-14 DIAGNOSIS — I509 Heart failure, unspecified: Secondary | ICD-10-CM | POA: Diagnosis not present

## 2014-08-14 DIAGNOSIS — J449 Chronic obstructive pulmonary disease, unspecified: Secondary | ICD-10-CM | POA: Diagnosis not present

## 2014-08-15 DIAGNOSIS — M6281 Muscle weakness (generalized): Secondary | ICD-10-CM | POA: Diagnosis not present

## 2014-08-15 DIAGNOSIS — R279 Unspecified lack of coordination: Secondary | ICD-10-CM | POA: Diagnosis not present

## 2014-08-15 DIAGNOSIS — R262 Difficulty in walking, not elsewhere classified: Secondary | ICD-10-CM | POA: Diagnosis not present

## 2014-08-15 DIAGNOSIS — J810 Acute pulmonary edema: Secondary | ICD-10-CM | POA: Diagnosis not present

## 2014-08-15 DIAGNOSIS — I509 Heart failure, unspecified: Secondary | ICD-10-CM | POA: Diagnosis not present

## 2014-08-15 DIAGNOSIS — R419 Unspecified symptoms and signs involving cognitive functions and awareness: Secondary | ICD-10-CM | POA: Diagnosis not present

## 2014-08-16 ENCOUNTER — Encounter: Payer: Self-pay | Admitting: Cardiovascular Disease

## 2014-08-16 DIAGNOSIS — J810 Acute pulmonary edema: Secondary | ICD-10-CM | POA: Diagnosis not present

## 2014-08-16 DIAGNOSIS — R279 Unspecified lack of coordination: Secondary | ICD-10-CM | POA: Diagnosis not present

## 2014-08-16 DIAGNOSIS — R419 Unspecified symptoms and signs involving cognitive functions and awareness: Secondary | ICD-10-CM | POA: Diagnosis not present

## 2014-08-16 DIAGNOSIS — I509 Heart failure, unspecified: Secondary | ICD-10-CM | POA: Diagnosis not present

## 2014-08-16 DIAGNOSIS — M6281 Muscle weakness (generalized): Secondary | ICD-10-CM | POA: Diagnosis not present

## 2014-08-16 DIAGNOSIS — R262 Difficulty in walking, not elsewhere classified: Secondary | ICD-10-CM | POA: Diagnosis not present

## 2014-08-17 ENCOUNTER — Other Ambulatory Visit: Payer: Self-pay | Admitting: Family Medicine

## 2014-08-21 ENCOUNTER — Other Ambulatory Visit: Payer: Self-pay | Admitting: Family Medicine

## 2014-08-23 ENCOUNTER — Other Ambulatory Visit: Payer: Self-pay

## 2014-08-23 MED ORDER — TRAZODONE HCL 50 MG PO TABS: ORAL_TABLET | ORAL | Status: DC

## 2014-08-23 NOTE — Telephone Encounter (Signed)
Katie pts daughter left v/m requesting refill on trazodone at [facility]. rx last printed # 30 x 1 on 07/01/14. Pt last seen 06/11/14. Katie request cb. Pt taking last pill pt has tonight. Katie said she fills pill case for pt weekly. Katie wants to know if OK for pt to be off med since has been taking for a long time.

## 2014-08-26 ENCOUNTER — Other Ambulatory Visit: Payer: Self-pay | Admitting: Family Medicine

## 2014-08-26 NOTE — Telephone Encounter (Signed)
Katie notified prescription refill was sent in on Friday.

## 2014-09-05 ENCOUNTER — Other Ambulatory Visit: Payer: Self-pay | Admitting: Family Medicine

## 2014-09-06 ENCOUNTER — Telehealth: Payer: Self-pay | Admitting: *Deleted

## 2014-09-06 NOTE — Telephone Encounter (Signed)
More commonly aricept causes nausea - but it is not impossible as a side eff  Let me know if any abd pain/ fever or other symptoms (if any symptoms of dehydration let us know)  Hold aricept for now and I will route to her PCP as well

## 2014-09-06 NOTE — Telephone Encounter (Signed)
Notified patient's daughter of Dr. Marliss Coots comments. Verbalized understanding.

## 2014-09-06 NOTE — Telephone Encounter (Signed)
Pt's daughter left message at Triage. Pt had diarrhea at 1st and now having uncontrollable loose stool for over a week and also pt doesn't have any appetite, pt was recently started on Aricept and daughter didn't know if that was a side eff. of med and should she keep taking it and also if she can take imodium to help  Dr. Deborra Medina out of office today so message sent to Dr. Deborra Medina and another provider because daughter would like a call back today

## 2014-09-10 ENCOUNTER — Other Ambulatory Visit: Payer: Self-pay | Admitting: Family Medicine

## 2014-09-11 ENCOUNTER — Other Ambulatory Visit: Payer: Self-pay | Admitting: Family Medicine

## 2014-09-13 ENCOUNTER — Telehealth: Payer: Self-pay | Admitting: Family Medicine

## 2014-09-13 ENCOUNTER — Emergency Department
Admission: EM | Admit: 2014-09-13 | Discharge: 2014-09-13 | Disposition: A | Discharge status: Home / Self Care | Payer: Medicare Other | Attending: Emergency Medicine | Admitting: Emergency Medicine

## 2014-09-13 ENCOUNTER — Encounter: Payer: Self-pay | Admitting: Emergency Medicine

## 2014-09-13 DIAGNOSIS — Z87891 Personal history of nicotine dependence: Secondary | ICD-10-CM | POA: Insufficient documentation

## 2014-09-13 DIAGNOSIS — Z79899 Other long term (current) drug therapy: Secondary | ICD-10-CM | POA: Insufficient documentation

## 2014-09-13 DIAGNOSIS — T39011A Poisoning by aspirin, accidental (unintentional), initial encounter: Secondary | ICD-10-CM | POA: Diagnosis not present

## 2014-09-13 DIAGNOSIS — Z7982 Long term (current) use of aspirin: Secondary | ICD-10-CM | POA: Insufficient documentation

## 2014-09-13 DIAGNOSIS — Y9389 Activity, other specified: Secondary | ICD-10-CM | POA: Diagnosis not present

## 2014-09-13 DIAGNOSIS — T50901A Poisoning by unspecified drugs, medicaments and biological substances, accidental (unintentional), initial encounter: Secondary | ICD-10-CM

## 2014-09-13 DIAGNOSIS — T381X1A Poisoning by thyroid hormones and substitutes, accidental (unintentional), initial encounter: Secondary | ICD-10-CM | POA: Insufficient documentation

## 2014-09-13 DIAGNOSIS — T43221A Poisoning by selective serotonin reuptake inhibitors, accidental (unintentional), initial encounter: Secondary | ICD-10-CM | POA: Diagnosis not present

## 2014-09-13 DIAGNOSIS — N183 Chronic kidney disease, stage 3 (moderate): Secondary | ICD-10-CM | POA: Insufficient documentation

## 2014-09-13 DIAGNOSIS — Y998 Other external cause status: Secondary | ICD-10-CM | POA: Insufficient documentation

## 2014-09-13 DIAGNOSIS — Y9289 Other specified places as the place of occurrence of the external cause: Secondary | ICD-10-CM | POA: Insufficient documentation

## 2014-09-13 DIAGNOSIS — T426X1A Poisoning by other antiepileptic and sedative-hypnotic drugs, accidental (unintentional), initial encounter: Secondary | ICD-10-CM | POA: Diagnosis not present

## 2014-09-13 DIAGNOSIS — I129 Hypertensive chronic kidney disease with stage 1 through stage 4 chronic kidney disease, or unspecified chronic kidney disease: Secondary | ICD-10-CM | POA: Insufficient documentation

## 2014-09-13 DIAGNOSIS — T461X1A Poisoning by calcium-channel blockers, accidental (unintentional), initial encounter: Secondary | ICD-10-CM | POA: Insufficient documentation

## 2014-09-13 DIAGNOSIS — T45521A Poisoning by antithrombotic drugs, accidental (unintentional), initial encounter: Secondary | ICD-10-CM | POA: Insufficient documentation

## 2014-09-13 DIAGNOSIS — Z7902 Long term (current) use of antithrombotics/antiplatelets: Secondary | ICD-10-CM | POA: Diagnosis not present

## 2014-09-13 DIAGNOSIS — T501X1A Poisoning by loop [high-ceiling] diuretics, accidental (unintentional), initial encounter: Secondary | ICD-10-CM | POA: Insufficient documentation

## 2014-09-13 NOTE — Telephone Encounter (Signed)
PLEASE NOTE: All timestamps contained within this report are represented as [place] Standard Time. CONFIDENTIALTY NOTICE: This fax transmission is intended only for the addressee. It contains information that is legally privileged, confidential or otherwise protected from use or disclosure. If you are not the intended recipient, you are strictly prohibited from reviewing, disclosing, copying using or disseminating any of this information or taking any action in reliance on or regarding this information. If you have received this fax in error, please notify us immediately by telephone so that we can arrange for its return to [place]. Phone: [phone], Toll-Free: [phone], Fax: [phone] Page: 1 of 2 Call Id: [id number] [place] Patient Name: Joy [name] Gender: Female DOB: [birth year] Age: 81 Y 35 M Return Phone Number: [phone] (Primary) Address: City/State/Zip: [place] Client [facility] - Client Client Site [place] - Day Physician Arnette Norris Contact Type Call Call Type Triage / Clinical Caller Name [name] Relationship To Patient Daughter Appointment Disposition EMR Appointment Not Necessary Info pasted into Epic Yes Return Phone Number [phone] (Primary) Chief Complaint OVERDOSE took too much medication at once Initial Comment Caller states her mother double dosed her meds this morning. PreDisposition Call Doctor Nurse Assessment Nurse: Mechele Dawley, RN, Amy Date/Time Eilene Ghazi Time): 09/13/2014 1:11:43 PM Confirm and document reason for call. If symptomatic, describe symptoms. ---DAUGHTER STATES THAT SHE DOUBLE DOSED THE HER MEDS THIS MORNING. DAUGHTER DOES NOT HAVE THE LIST OF MEDS WITH HER. SHE HAS GABAPENTIN AND PLAVIX. SHE GOT MEDS AT 0900 AND AROUND 1130 THE MEDS WERE GIVEN AGAIN. Has the patient traveled out of the  country within the last 30 days? ---Not Applicable Does the patient require triage? ---Yes Related visit to physician within the last 2 weeks? ---No Does the PT have any chronic conditions? (i.e. diabetes, asthma, etc.) ---Yes List chronic conditions. ---PLAVIX, GABAPENTIN, HEART MEDS Guidelines Guideline Title Affirmed Question Affirmed Notes Nurse Date/Time (Eastern Time) Poisoning [1] DOUBLE DOSE (an extra dose or lesser amount) of prescription drug AND [2] any symptoms (e.g., dizziness, nausea, pain, sleepiness) [place], RN, Amy 09/13/2014 1:13:12 PM Disp. Time Eilene Ghazi Time) Disposition Final User 09/13/2014 1:05:20 PM Send to Urgent Queue Honor Loh 09/13/2014 1:15:10 PM Call [place] Now [place], [place], Amy PLEASE NOTE: All timestamps contained within this report are represented as [place] Standard Time. CONFIDENTIALTY NOTICE: This fax transmission is intended only for the addressee. It contains information that is legally privileged, confidential or otherwise protected from use or disclosure. If you are not the intended recipient, you are strictly prohibited from reviewing, disclosing, copying using or disseminating any of this information or taking any action in reliance on or regarding this information. If you have received this fax in error, please notify us immediately by telephone so that we can arrange for its return to [place]. Phone: [phone], Toll-Free: [phone], Fax: [phone] Page: 2 of 2 Call Id: [id number] [place] Understands: Yes Disagree/Comply: Comply Care Advice Given Per Guideline CALL [place]: You need to call the [facility] now. The phone number is (___) - ___-____. CARE ADVICE given per Poisoning (Adult) guideline. * Phone number: [phone] After Care Instructions Given Call Event Type User Date / Time Description

## 2014-09-13 NOTE — ED Notes (Signed)
Pt to ED with c/o taking 2 plavix and 2 amlodipine on accident today, pt's daughter states she gave the medication and then the pt's CNA gave her the medication also, took meds at 0900 and then at 1100, [place] control was already notified prior to pt's arrival, recommendations include BP, pulse, EKG and hydrate well, pt states the only symptoms she has is feeling "shaky"

## 2014-09-13 NOTE — Telephone Encounter (Signed)
[place] Call Center     Patient Name: Makinze Ishii Initial Comment Caller states her mother double dosed her meds this morning.  DOB: Nov 24, 1930      Nurse Assessment  Nurse: Mechele Dawley, RN, Amy Date/Time Eilene Ghazi Time): 09/13/2014 1:11:43 PM  Confirm and document reason for call. If symptomatic, describe symptoms. ---DAUGHTER STATES THAT SHE DOUBLE DOSED THE HER MEDS THIS MORNING. DAUGHTER DOES NOT HAVE THE LIST OF MEDS WITH HER. SHE HAS GABAPENTIN AND PLAVIX. SHE GOT MEDS AT 0900 AND AROUND 1130 THE MEDS WERE GIVEN AGAIN.   Has the patient traveled out of the country within the last 30 days? ---Not Applicable   Does the patient require triage? ---Yes   Related visit to physician within the last 2 weeks? ---No   Does the PT have any chronic conditions? (i.e. diabetes, asthma, etc.) ---Yes   List chronic conditions. ---PLAVIX, GABAPENTIN, HEART MEDS     Guidelines     Guideline Title Affirmed Question Affirmed Notes   Poisoning [1] DOUBLE DOSE (an extra dose or lesser amount) of prescription drug AND [2] any symptoms (e.g., dizziness, nausea, pain, sleepiness)    Final Disposition User   Call [facility] Now [place], [place], [place]       Disagree/Comply: Comply

## 2014-09-13 NOTE — ED Notes (Signed)
Pt accidentally took her x2, poison control called and recommendations carried out.

## 2014-09-13 NOTE — ED Provider Notes (Signed)
[facility] Emergency Department Provider Note  ____________________________________________  Time seen: Approximately 5:01 PM  I have reviewed the triage vital signs and the nursing notes.   HISTORY  Chief Complaint Drug Overdose    HPI Joy [name] is a 81 y.o. female with a history of hypertension and CAD who was accidentally given 2 doses of her morning meds this morning at 9 AM and 11 AM. The medicine includes amlodipine, furosemide, Synthroid, aspirin 81 mg, Plavix 75 mg, Zoloft 100 mg, Neurontin 800 mg. The patient denies any symptoms at this time. Denies feeling shaky as was noted earlier in the nursing notes. No pain.Was given 2 doses of her meds because of a missed medication between the family and her CNA. The daughter, who is at the bedside says that she is aware of the issue and says that it will not happen again.   Past Medical History  Diagnosis Date  . HTN (hypertension)   . COPD (chronic obstructive pulmonary disease)   . Depression   . Thyroid disease   . PMR (polymyalgia rheumatica)     with h/o steroid induced myopathy  . Anxiety   . Diastolic dysfunction     Echo 05/11/2010, EF 65-70%  . TIA (transient ischemic attack)     11/05 s/p L CEA  . Kidney infection   . Carotid artery occlusion   . CAD (coronary artery disease)     h/o NSTEMI, LAD stent placement 10/04, cath 05/11/2010  . HLD (hyperlipidemia)     Patient Active Problem List   Diagnosis Date Noted  . Chronic diastolic heart failure [id number]  . Dizziness and giddiness 06/11/2014  . Vaginitis and vulvovaginitis 06/11/2014  . CKD (chronic kidney disease) stage 3, GFR 30-59 ml/min 06/11/2014  . Fall at home 05/08/2014  . Fatigue 05/08/2014  . Carotid artery disease 04/08/2014  . Nevus of cheek 10/30/2013  . CAD (coronary artery disease)   . HLD (hyperlipidemia)   . Lung nodule 01/15/2013  . Memory loss 01/15/2013  . Decreased appetite 01/15/2013  . Weakness  generalized 12/26/2012  . Incontinence 12/26/2012  . Dysuria 12/01/2012  . Yeast vaginitis 12/01/2012  . Vulvar irritation 09/15/2012  . GERD (gastroesophageal reflux disease) 09/06/2012  . Neuropathy 01/14/2011  . Insomnia 01/14/2011  . 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 HTN (hypertension)   . COPD (chronic obstructive pulmonary disease)   . Depression   . Thyroid disease     Past Surgical History  Procedure Laterality Date  . Cholecystectomy    . Carotid endarterectomy      left with stent placement 11/2003  . Iliac artery stent      right, 2001  . Cardiac catheterization      unc  . Cardiac catheterization    . Cardiac catheterization    . Lung biopsy      Current Outpatient Rx  Name  Route  Sig  Dispense  Refill  . ADVAIR DISKUS 250-50 MCG/DOSE AEPB      INHALE 1 PUFF BY MOUTH TWICE A DAY   60 each   5   . albuterol (PROVENTIL HFA;VENTOLIN HFA) 108 (90 BASE) MCG/ACT inhaler   Inhalation   Inhale 2 puffs into the lungs every 6 (six) hours as needed for wheezing.   1 Inhaler   0   . amLODipine (NORVASC) 10 MG tablet   Oral   Take 10 mg by mouth daily.         Marland Kitchen  aspirin EC 81 MG tablet   Oral   Take 81 mg by mouth daily.           Marland Kitchen atorvastatin (LIPITOR) 40 MG tablet      TAKE 1 TABLET BY MOUTH EVERY DAY   30 tablet   0     OFFICE VISIT WITH LABS REQUIRED FOR ADDITIONAL REF ...   . clopidogrel (PLAVIX) 75 MG tablet      TAKE 1 TABLET BY MOUTH EVERY DAY   30 tablet   5   . furosemide (LASIX) 40 MG tablet   Oral   Take 40 mg by mouth daily.         Marland Kitchen gabapentin (NEURONTIN) 800 MG tablet      TAKE 1 TABLET BY MOUTH TWICE A DAY   60 tablet   3   . levothyroxine (SYNTHROID, LEVOTHROID) 25 MCG tablet      TAKE 1 TABLET BY MOUTH EVERY DAY   30 tablet   9   . lisinopril (PRINIVIL,ZESTRIL) 10 MG tablet      TAKE 2 TABLETS (20 MG TOTAL) BY MOUTH DAILY.   60 tablet   5   .  nitroGLYCERIN (NITROSTAT) 0.4 MG SL tablet   Sublingual   Place 0.4 mg under the tongue every 5 (five) minutes as needed.           . ranitidine (ZANTAC) 300 MG tablet      TAKE 1 TABLET (300 MG TOTAL) BY MOUTH AT BEDTIME.   90 tablet   1   . sertraline (ZOLOFT) 100 MG tablet      TAKE 1 TABLET BY MOUTH EVERY DAY   30 tablet   5   . SPIRIVA HANDIHALER 18 MCG inhalation capsule      PLACE 1 CAPSULE (18 MCG TOTAL) INTO INHALER AND INHALE DAILY.   90 capsule   2   . terconazole (TERAZOL 3) 0.8 % vaginal cream   Vaginal   Place 1 applicator vaginally at bedtime.   20 g   0   . traZODone (DESYREL) 50 MG tablet      TAKE 1/2 TO 1 TABLET AT BEDTIME AS NEEDED FOR SLEEP   30 tablet   0   . vitamin B-12 (CYANOCOBALAMIN) 1000 MCG tablet   Oral   Take 1,000 mcg by mouth daily.         . Vitamin D, Ergocalciferol, (DRISDOL) 50000 UNITS CAPS capsule      TAKE 1 CAPSULE (50,000 UNITS TOTAL) BY MOUTH EVERY 7 (SEVEN) DAYS.   6 capsule   3     Allergies Codeine; Prednisone; and Shellfish allergy  No family history on file.  Social History Social History  Substance Use Topics  . Smoking status: Former Smoker -- 1.00 packs/day for 25 years    Types: Cigarettes    Quit date: 01/05/1996  . Smokeless tobacco: Never Used  . Alcohol Use: No    Review of Systems Constitutional: No fever/chills Eyes: No visual changes. ENT: No sore throat. Cardiovascular: Denies chest pain. Respiratory: Denies shortness of breath. Gastrointestinal: No abdominal pain.  No nausea, no vomiting.  No diarrhea.  No constipation. Genitourinary: Negative for dysuria. Musculoskeletal: Negative for back pain. Skin: Negative for rash. Neurological: Negative for headaches, focal weakness or numbness.  10-point ROS otherwise negative.  ____________________________________________   PHYSICAL EXAM:  VITAL SIGNS: ED Triage Vitals  Enc Vitals Group     BP 09/13/14 1538 124/51 mmHg  Pulse Rate 09/13/14 1538 64     Resp 09/13/14 1538 20     Temp 09/13/14 1538 98.9 F (37.2 C)     Temp Source 09/13/14 1538 Oral     SpO2 09/13/14 1538 94 %     Weight 09/13/14 1538 200 lb (90.719 kg)     Height 09/13/14 1538 5\' 3"  (1.6 m)     Head Cir --      Peak Flow --      Pain Score 09/13/14 1539 0     Pain Loc --      Pain Edu? --      Excl. in [place]? --     Constitutional: Alert and oriented. Well appearing and in no acute distress. Eyes: Conjunctivae are normal. PERRL. EOMI. Head: Atraumatic. Nose: No congestion/rhinnorhea. Mouth/Throat: Mucous membranes are moist.  Oropharynx non-erythematous. Neck: No stridor.   Cardiovascular: Normal rate, regular rhythm. Grossly normal heart sounds.  Good peripheral circulation. Respiratory: Normal respiratory effort.  No retractions. Lungs CTAB. Gastrointestinal: Soft and nontender. No distention. No abdominal bruits. No CVA tenderness. Musculoskeletal: No lower extremity tenderness nor edema.  No joint effusions. Neurologic:  Normal speech and language. No gross focal neurologic deficits are appreciated. No gait instability. Skin:  Skin is warm, dry and intact. No rash noted. Psychiatric: Mood and affect are normal. Speech and behavior are normal.  ____________________________________________   LABS (all labs ordered are listed, but only abnormal results are displayed)  Labs Reviewed - No data to display ____________________________________________  EKG  ED ECG REPORT I, Maynard ,  Youlanda Roys, the attending physician, personally viewed and interpreted this ECG.   Date: 09/13/2014  EKG Time: 1720  Rate: 65  Rhythm: normal EKG, normal sinus rhythm  Axis: Normal axis  Intervals:none  ST&T Change: No ST segment elevation or depression. No abnormal T-wave  inversions.  ____________________________________________  RADIOLOGY   ____________________________________________   PROCEDURES    ____________________________________________   INITIAL IMPRESSION / ASSESSMENT AND PLAN / ED COURSE  Pertinent labs & imaging results that were available during my care of the patient were reviewed by me and considered in my medical decision making (see chart for details).  ----------------------------------------- 5:05 PM on 09/13/2014 -----------------------------------------  Patient a symptomatically with normal heart rate and blood pressure. We'll discharge to home. Given symptoms to be mindful of including weakness, feelings of lightheadedness or that she will pass out. Daughter says that she also has a blood pressure cuff at home and will monitor her blood pressure. ____________________________________________   FINAL CLINICAL IMPRESSION(S) / ED DIAGNOSES  Acute accidental drug overdose initial visit.    Orbie Pyo, MD 09/13/14 [phone]

## 2014-09-13 NOTE — Discharge Instructions (Signed)
Accidental Overdose °A drug overdose occurs when a chemical substance (drug or medication) is used in amounts large enough to overcome a person. This may result in severe illness or death. This is a type of poisoning. Accidental overdoses of medications or other substances come from a variety of reasons. When this happens accidentally, it is often because the person taking the substance does not know enough about what they have taken. Drugs which commonly cause overdose deaths are alcohol, psychotropic medications (medications which affect the mind), pain medications, illegal drugs (street drugs) such as cocaine and heroin, and multiple drugs taken at the same time. It may result from careless behavior (such as over-indulging at a party). Other causes of overdose may include multiple drug use, a lapse in memory, or drug use after a period of no drug use.  °Sometimes overdosing occurs because a person cannot remember if they have taken their medication.  °A common unintentional overdose in young children involves multi-vitamins containing iron. Iron is a part of the hemoglobin molecule in blood. It is used to transport oxygen to living cells. When taken in small amounts, iron allows the body to restock hemoglobin. In large amounts, it causes problems in the body. If this overdose is not treated, it can lead to death. °Never take medicines that show signs of tampering or do not seem quite right. Never take medicines in the dark or in poor lighting. Read the label and check each dose of medicine before you take it. When adults are poisoned, it happens most often through carelessness or lack of information. Taking medicines in the dark or taking medicine prescribed for someone else to treat the same type of problem is a dangerous practice. °SYMPTOMS  °Symptoms of overdose depend on the medication and amount taken. They can vary from over-activity with stimulant over-dosage, to sleepiness from depressants such as  alcohol, narcotics and tranquilizers. Confusion, dizziness, nausea and vomiting may be present. If problems are severe enough coma and death may result. °DIAGNOSIS  °Diagnosis and management are generally straightforward if the drug is known. Otherwise it is more difficult. At times, certain symptoms and signs exhibited by the patient, or blood tests, can reveal the drug in question.  °TREATMENT  °In an emergency department, most patients can be treated with supportive measures. Antidotes may be available if there has been an overdose of opioids or benzodiazepines. A rapid improvement will often occur if this is the cause of overdose. °At home or away from medical care: °· There may be no immediate problems or warning signs in children. °· Not everything works well in all cases of poisoning. °· Take immediate action. Poisons may act quickly. °· If you think someone has swallowed medicine or a household product, and the person is unconscious, having seizures (convulsions), or is not breathing, immediately call for an ambulance. °IF a person is conscious and appears to be doing OK but has swallowed a poison: °· Do not wait to see what effect the poison will have. Immediately call a poison control center (listed in the white pages of your telephone book under "Poison Control" or inside the front cover with other emergency numbers). Some poison control centers have TTY capability for the deaf. Check with your local center if you or someone in your family requires this service. °· Keep the container so you can read the label on the product for ingredients. °· Describe what, when, and how much was taken and the age and condition of the person poisoned.   Inform them if the person is vomiting, choking, drowsy, shows a change in color or temperature of skin, is conscious or unconscious, or is convulsing.  Do not cause vomiting unless instructed by medical personnel. Do not induce vomiting or force liquids into a person who  is convulsing, unconscious, or very drowsy. Stay calm and in control.   Activated charcoal also is sometimes used in certain types of poisoning and you may wish to add a supply to your emergency medicines. It is available without a prescription. Call a poison control center before using this medication. PREVENTION  Thousands of children die every year from unintentional poisoning. This may be from household chemicals, poisoning from carbon monoxide in a car, taking their parent's medications, or simply taking a few iron pills or vitamins with iron. Poisoning comes from unexpected sources.  Store medicines out of the sight and reach of children, preferably in a locked cabinet. Do not keep medications in a food cabinet. Always store your medicines in a secure place. Get rid of expired medications.  If you have children living with you or have them as occasional guests, you should have child-resistant caps on your medicine containers. Keep everything out of reach. Child proof your home.  If you are called to the telephone or to answer the door while you are taking a medicine, take the container with you or put the medicine out of the reach of small children.  Do not take your medication in front of children. Do not tell your child how good a medication is and how good it is for them. They may get the idea it is more of a treat.  If you are an adult and have accidentally taken an overdose, you need to consider how this happened and what can be done to prevent it from happening again. If this was from a street drug or alcohol, determine if there is a problem that needs addressing. If you are not sure a problems exists, it is easy to talk to a professional and ask them if they think you have a problem. It is better to handle this problem in this way before it happens again and has a much worse consequence. Document Released: 03/06/2004 Document Revised: 03/15/2011 Document Reviewed: 08/12/2008 [facility]  Patient Information 2015 [place], [place]. This information is not intended to replace advice given to you by your health care provider. Make sure you discuss any questions you have with your health care provider.

## 2014-09-20 ENCOUNTER — Other Ambulatory Visit: Payer: Self-pay | Admitting: Family Medicine

## 2014-09-20 NOTE — Telephone Encounter (Signed)
Pt has not had f/u visit in over a year; acute only.

## 2014-09-23 ENCOUNTER — Ambulatory Visit (INDEPENDENT_AMBULATORY_CARE_PROVIDER_SITE_OTHER): Payer: Medicare Other | Admitting: Cardiovascular Disease

## 2014-09-23 ENCOUNTER — Encounter: Payer: Self-pay | Admitting: Cardiovascular Disease

## 2014-09-23 VITALS — BP 106/60 | HR 64 | Ht 64.0 in | Wt 195.0 lb

## 2014-09-23 DIAGNOSIS — I6523 Occlusion and stenosis of bilateral carotid arteries: Secondary | ICD-10-CM

## 2014-09-23 DIAGNOSIS — I10 Essential (primary) hypertension: Secondary | ICD-10-CM | POA: Diagnosis not present

## 2014-09-23 DIAGNOSIS — I5032 Chronic diastolic (congestive) heart failure: Secondary | ICD-10-CM | POA: Diagnosis not present

## 2014-09-23 DIAGNOSIS — E785 Hyperlipidemia, unspecified: Secondary | ICD-10-CM | POA: Diagnosis not present

## 2014-09-23 DIAGNOSIS — I2510 Atherosclerotic heart disease of native coronary artery without angina pectoris: Secondary | ICD-10-CM | POA: Diagnosis not present

## 2014-09-23 NOTE — Assessment & Plan Note (Signed)
She appears to be euvolemic on current dose of Lasix.

## 2014-09-23 NOTE — Assessment & Plan Note (Signed)
Lab Results  Component Value Date   CHOL 153 10/12/2013   HDL 31.60* 10/12/2013   LDLCALC 76 08/20/2012   LDLDIRECT 87.5 10/12/2013   TRIG 208.0* 10/12/2013   CHOLHDL 5 10/12/2013    continue treatment with atorvastatin. LDL was close to target.

## 2014-09-23 NOTE — Progress Notes (Signed)
Primary care physician: Dr. Deborra Medina  HPI  This is a pleasant 81 year old female who is here today for followup visit regarding coronary artery disease and chronic diastolic heart failure. She has known history of coronary artery disease with previous LAD stent in 2004 at [facility], vascular dementia, CVA, carotid artery stenting, chronic diastolic heart failure, peripheral arterial disease, hypertension and hyperlipidemia. She was hospitalized briefly at [facility] with atypical chest pain.  She had a previous echocardiogram in 2011 which showed normal LV systolic function.  She underwent an outpatient nuclear stress test in 02/2013 which was normal. Metoprolol was discontinued due to bradycardia. She lives at [facility] [place] with her husband.  she had one hospitalization this year for acute diastolic heart failure. She had no recurrent episodes since she was placed on Lasix. Renal artery duplex showed no evidence of renal artery stenosis. She has been doing reasonably well. She did have accidental doubling of her  Medications recently but there was no side effects to that.  Allergies  Allergen Reactions  . Codeine Other (See Comments)    Coma for 2 days  . Prednisone Other (See Comments)    Weight gain,puffy face  . Shellfish Allergy      Current Outpatient Prescriptions on File Prior to Visit  Medication Sig Dispense Refill  . ADVAIR DISKUS 250-50 MCG/DOSE AEPB INHALE 1 PUFF BY MOUTH TWICE A DAY 60 each 5  . albuterol (PROVENTIL HFA;VENTOLIN HFA) 108 (90 BASE) MCG/ACT inhaler Inhale 2 puffs into the lungs every 6 (six) hours as needed for wheezing. 1 Inhaler 0  . amLODipine (NORVASC) 10 MG tablet Take 10 mg by mouth daily.    Marland Kitchen aspirin EC 81 MG tablet Take 81 mg by mouth daily.      Marland Kitchen atorvastatin (LIPITOR) 40 MG tablet TAKE 1 TABLET BY MOUTH EVERY DAY 30 tablet 0  . clopidogrel (PLAVIX) 75 MG tablet TAKE 1 TABLET BY MOUTH EVERY DAY 30 tablet 5  . furosemide (LASIX) 40 MG tablet Take 40 mg by  mouth daily.    Marland Kitchen gabapentin (NEURONTIN) 800 MG tablet TAKE 1 TABLET BY MOUTH TWICE A DAY 60 tablet 3  . levothyroxine (SYNTHROID, LEVOTHROID) 25 MCG tablet TAKE 1 TABLET BY MOUTH EVERY DAY 30 tablet 9  . lisinopril (PRINIVIL,ZESTRIL) 10 MG tablet TAKE 2 TABLETS (20 MG TOTAL) BY MOUTH DAILY. 60 tablet 5  . nitroGLYCERIN (NITROSTAT) 0.4 MG SL tablet Place 0.4 mg under the tongue every 5 (five) minutes as needed.      . ranitidine (ZANTAC) 300 MG tablet TAKE 1 TABLET (300 MG TOTAL) BY MOUTH AT BEDTIME. 90 tablet 1  . sertraline (ZOLOFT) 100 MG tablet TAKE 1 TABLET BY MOUTH EVERY DAY 30 tablet 5  . SPIRIVA HANDIHALER 18 MCG inhalation capsule PLACE 1 CAPSULE (18 MCG TOTAL) INTO INHALER AND INHALE DAILY. 90 capsule 2  . terconazole (TERAZOL 3) 0.8 % vaginal cream Place 1 applicator vaginally at bedtime. 20 g 0  . traZODone (DESYREL) 50 MG tablet TAKE 1/2 TO 1 TABLET AT BEDTIME AS NEEDED FOR SLEEP 30 tablet 0  . vitamin B-12 (CYANOCOBALAMIN) 1000 MCG tablet Take 1,000 mcg by mouth daily.    . Vitamin D, Ergocalciferol, (DRISDOL) 50000 UNITS CAPS capsule TAKE 1 CAPSULE (50,000 UNITS TOTAL) BY MOUTH EVERY 7 (SEVEN) DAYS. 6 capsule 3   No current facility-administered medications on file prior to visit.     Past Medical History  Diagnosis Date  . HTN (hypertension)   . COPD (chronic obstructive  pulmonary disease)   . Depression   . Thyroid disease   . PMR (polymyalgia rheumatica)     with h/o steroid induced myopathy  . Anxiety   . Diastolic dysfunction     Echo 05/11/2010, EF 65-70%  . TIA (transient ischemic attack)     11/05 s/p L CEA  . Kidney infection   . Carotid artery occlusion   . CAD (coronary artery disease)     h/o NSTEMI, LAD stent placement 10/04, cath 05/11/2010  . HLD (hyperlipidemia)      Past Surgical History  Procedure Laterality Date  . Cholecystectomy    . Carotid endarterectomy      left with stent placement 11/2003  . Iliac artery stent      right, 2001  .  Cardiac catheterization      unc  . Cardiac catheterization    . Cardiac catheterization    . Lung biopsy       History reviewed. No pertinent family history.   Social History   Social History  . Marital Status: Married    Spouse Name: N/A  . Number of Children: N/A  . Years of Education: N/A   Occupational History  . retired     Pharmacist, hospital   Social History Main Topics  . Smoking status: Former Smoker -- 1.00 packs/day for 25 years    Types: Cigarettes    Quit date: 01/05/1996  . Smokeless tobacco: Never Used  . Alcohol Use: No  . Drug Use: No  . Sexual Activity: Not on file   Other Topics Concern  . Not on file   Social History Narrative   Lives with husband, [name] at [place].   Daughters, [name] and [name] very involved.     PHYSICAL EXAM   BP 106/60 mmHg  Pulse 64  Ht 5\' 4"  (1.626 m)  Wt 195 lb (88.451 kg)  BMI 33.46 kg/m2 Constitutional: She is oriented to person, place, and time. She appears well-developed and well-nourished. No distress.  HENT: No nasal discharge.  Head: Normocephalic and atraumatic.  Eyes: Pupils are equal and round. No discharge.  Neck: Normal range of motion. Neck supple. No JVD present. No thyromegaly present.  Cardiovascular: Normal rate, regular rhythm, normal heart sounds. Exam reveals no gallop and no friction rub. No murmur heard.  Pulmonary/Chest: Effort normal and breath sounds normal. No stridor. No respiratory distress. She has no wheezes. She has no rales. She exhibits no tenderness.  Abdominal: Soft. Bowel sounds are normal. She exhibits no distension. There is no tenderness. There is no rebound and no guarding.  Musculoskeletal: Normal range of motion. She exhibits no edema and no tenderness.  Neurological: She is alert and oriented to person, place, and time. Coordination normal.  Skin: Skin is warm and dry. No rash noted. She is not diaphoretic. No erythema. No pallor.  Psychiatric: She has a normal mood and affect.  Her behavior is normal. Judgment and thought content normal.       ASSESSMENT AND PLAN

## 2014-09-23 NOTE — Assessment & Plan Note (Signed)
She is doing reasonably well with no symptoms suggestive of angina. Continue medical therapy. She does not need to be on long term dual antiplatelets therapy and thus I decided to discontinue aspirin and keep her on Plavix given the presence of carotid stent.

## 2014-09-23 NOTE — Assessment & Plan Note (Signed)
Blood pressure is well controlled 

## 2014-09-23 NOTE — Patient Instructions (Addendum)
Medication Instructions: Stop taking Aspirin.  Continue other medications.   Labwork: None.   Procedures/Testing: None.   Follow-Up: 6 months with Dr. Fletcher Anon.   Any Additional Special Instructions Will Be Listed Below (If Applicable). Check blood pressure 3 times/ week.  Monitor weight daily.

## 2014-09-26 ENCOUNTER — Other Ambulatory Visit: Payer: Self-pay | Admitting: Family Medicine

## 2014-09-27 ENCOUNTER — Telehealth: Payer: Self-pay | Admitting: Family Medicine

## 2014-09-27 NOTE — Telephone Encounter (Signed)
Per DPR left detailed voicemail on [name]'s phone letting her know Dr. Marliss Coots recommendations

## 2014-09-27 NOTE — Telephone Encounter (Signed)
Optimally she should see [facility] in office -or sat clinic if everyone is full  If she declines - schedule with PCP next week but do update or go to ED if symptoms return or worsen

## 2014-09-27 NOTE — Telephone Encounter (Signed)
Spoke with [name] pts daughter; paramedics came out to pick up pt but pt did not want to go to ED; paramedics checked vitals,EKG, listened to lungs and [name] said pt was OK not to go to ED. Pt feels OK now; [name] cannot tell a lot of difference in how pts leg looks and paramedic said lungs were clear of fluid and pt has slight chest congestion due to a cold. [name] will cb if needed. Dr Deborra Medina out of office; will send note to Dr Deborra Medina and Dr Glori Bickers who is in office.

## 2014-09-27 NOTE — Telephone Encounter (Signed)
Patient Name: Joy [name] DOB: 09-19-1930 Initial Comment Caller states her mother has swelling in her left leg, and foot and ankle. Warm to the touch. Redness. Leg pain. Nurse Assessment Nurse: Vallery Sa, RN, Cathy Date/Time (Eastern Time): 09/27/2014 9:32:42 AM Confirm and document reason for call. If symptomatic, describe symptoms. ---Caller states her mother developed swelling, redness and pain in her left, lower leg. No fever. No severe breathing difficulty. Mild chest tightness (rated as a 3 on the 1 to 10 scale). She bumped her leg on the wheel chair 3-4 days ago. Has the patient traveled out of the country within the last 30 days? ---No Does the patient require triage? ---Yes Related visit to physician within the last 2 weeks? ---No Does the PT have any chronic conditions? (i.e. diabetes, asthma, etc.) ---Yes List chronic conditions. ---COPD, Emphysema, Neuropathy, CHF Guidelines Guideline Title Affirmed Question Affirmed Notes Chest Pain [1] Chest pain lasts > 5 minutes AND [2] history of heart disease (i.e., heart attack, bypass surgery, angina, angioplasty, CHF; not just a heart murmur) CHF Final Disposition User Call EMS 911 Now Vallery Sa, RN, [place] Disagree/Comply: Comply [name] states that 911 is on the way.

## 2014-10-01 ENCOUNTER — Ambulatory Visit: Payer: Medicare Other | Admitting: Family Medicine

## 2014-10-01 DIAGNOSIS — R0602 Shortness of breath: Secondary | ICD-10-CM | POA: Diagnosis not present

## 2014-10-01 DIAGNOSIS — B3783 Candidal cheilitis: Secondary | ICD-10-CM | POA: Diagnosis not present

## 2014-10-01 DIAGNOSIS — J449 Chronic obstructive pulmonary disease, unspecified: Secondary | ICD-10-CM | POA: Diagnosis not present

## 2014-10-01 DIAGNOSIS — J9611 Chronic respiratory failure with hypoxia: Secondary | ICD-10-CM | POA: Diagnosis not present

## 2014-10-05 ENCOUNTER — Other Ambulatory Visit: Payer: Self-pay | Admitting: Family Medicine

## 2014-10-18 ENCOUNTER — Other Ambulatory Visit: Payer: Self-pay | Admitting: Family Medicine

## 2014-10-18 NOTE — Telephone Encounter (Signed)
Lm on pts daughters vm and informed her OV required for any additional refills

## 2014-10-18 NOTE — Telephone Encounter (Signed)
Ok to phone in [place]

## 2014-10-18 NOTE — Telephone Encounter (Signed)
Last office visit 06/11/2014.  Last refilled 08/23/2014 for #30 with no refills.  Ok to refill?

## 2014-10-28 ENCOUNTER — Other Ambulatory Visit: Payer: Self-pay | Admitting: Family Medicine

## 2014-10-28 NOTE — Telephone Encounter (Signed)
Lm on pts vm informing her OV required for additional refills. Last labs 10/2013

## 2014-10-31 DIAGNOSIS — Z23 Encounter for immunization: Secondary | ICD-10-CM | POA: Diagnosis not present

## 2014-11-04 ENCOUNTER — Encounter: Payer: Self-pay | Admitting: Family Medicine

## 2014-11-04 ENCOUNTER — Ambulatory Visit (INDEPENDENT_AMBULATORY_CARE_PROVIDER_SITE_OTHER): Payer: Medicare Other | Admitting: Family Medicine

## 2014-11-04 VITALS — BP 110/54 | HR 74 | Temp 97.9°F | Wt 197.0 lb

## 2014-11-04 DIAGNOSIS — F32A Depression, unspecified: Secondary | ICD-10-CM

## 2014-11-04 DIAGNOSIS — E785 Hyperlipidemia, unspecified: Secondary | ICD-10-CM | POA: Diagnosis not present

## 2014-11-04 DIAGNOSIS — J309 Allergic rhinitis, unspecified: Secondary | ICD-10-CM | POA: Insufficient documentation

## 2014-11-04 DIAGNOSIS — I6523 Occlusion and stenosis of bilateral carotid arteries: Secondary | ICD-10-CM | POA: Diagnosis not present

## 2014-11-04 DIAGNOSIS — F329 Major depressive disorder, single episode, unspecified: Secondary | ICD-10-CM

## 2014-11-04 LAB — COMPREHENSIVE METABOLIC PANEL
ALK PHOS: 118 U/L — AB (ref 39–117)
ALT: 12 U/L (ref 0–35)
AST: 32 U/L (ref 0–37)
Albumin: 3.6 g/dL (ref 3.5–5.2)
BILIRUBIN TOTAL: 0.7 mg/dL (ref 0.2–1.2)
BUN: 15 mg/dL (ref 6–23)
CO2: 33 mEq/L — ABNORMAL HIGH (ref 19–32)
Calcium: 9.4 mg/dL (ref 8.4–10.5)
Chloride: 102 mEq/L (ref 96–112)
Creatinine, Ser: 1.11 mg/dL (ref 0.40–1.20)
GFR: 50.03 mL/min — ABNORMAL LOW (ref >60.00)
GLUCOSE: 106 mg/dL — AB (ref 70–99)
Potassium: 4.8 mEq/L (ref 3.5–5.1)
SODIUM: 146 meq/L — AB (ref 135–145)
TOTAL PROTEIN: 6.2 g/dL (ref 6.0–8.3)

## 2014-11-04 LAB — LIPID PANEL
Cholesterol: 147 mg/dL (ref 0–200)
HDL: 39.8 mg/dL (ref >39.00)
LDL Cholesterol: 68 mg/dL (ref 0–99)
NONHDL: 107.25
Total CHOL/HDL Ratio: 4
Triglycerides: 197 mg/dL — ABNORMAL HIGH (ref 0.0–149.0)
VLDL: 39.4 mg/dL (ref 0.0–40.0)

## 2014-11-04 NOTE — Progress Notes (Signed)
Pre visit review using our clinic review tool, if applicable. No additional management support is needed unless otherwise documented below in the visit note. 

## 2014-11-04 NOTE — Progress Notes (Signed)
Subjective:   Patient ID: Joy [name], female    DOB: 14-Dec-1930, 81 y.o.   MRN: [id number]  Joy [name] is a pleasant 81 y.o. year old female who presents to clinic today with her daughter Joy [name] for Follow-up; Medication Refill; and Depression  on 11/04/2014  HPI:   HLD- taking lipitor 40 mg daily. Overdue for labs. Lab Results  Component Value Date   CHOL 153 10/12/2013   HDL 31.60* 10/12/2013   LDLCALC 76 08/20/2012   LDLDIRECT 87.5 10/12/2013   TRIG 208.0* 10/12/2013   CHOLHDL 5 10/12/2013    Hypothyroidism- has been taking synthroid 25 mcg daily. Has been feeling more depressed lately but denies any other symptoms of hypo or hyperthyroidism. Lab Results  Component Value Date   TSH 1.52 05/08/2014   Depression- Currently taking zoloft 100 mg daily.  Her children have noticed that she seems less interested in going and doing things.  Joy [name] says it is because she is tired and doesn't like to drag her oxygen around.  "I have never been a morning person."  Says "I am not sad."  Sleeping well with Trazodone.  Denies SI or HI. Appetite good.      Current Outpatient Prescriptions on File Prior to Visit  Medication Sig Dispense Refill  . ADVAIR DISKUS 250-50 MCG/DOSE AEPB INHALE 1 PUFF BY MOUTH TWICE A DAY 60 each 5  . albuterol (PROVENTIL HFA;VENTOLIN HFA) 108 (90 BASE) MCG/ACT inhaler Inhale 2 puffs into the lungs every 6 (six) hours as needed for wheezing. 1 Inhaler 0  . amLODipine (NORVASC) 10 MG tablet Take 10 mg by mouth daily.    Marland Kitchen atorvastatin (LIPITOR) 40 MG tablet TAKE 1 TABLET BY MOUTH EVERY DAY 30 tablet 0  . clopidogrel (PLAVIX) 75 MG tablet TAKE 1 TABLET BY MOUTH EVERY DAY 30 tablet 5  . furosemide (LASIX) 40 MG tablet Take 40 mg by mouth daily.    Marland Kitchen gabapentin (NEURONTIN) 800 MG tablet TAKE 1 TABLET BY MOUTH TWICE A DAY 60 tablet 6  . levothyroxine (SYNTHROID, LEVOTHROID) 25 MCG tablet TAKE 1 TABLET BY MOUTH EVERY DAY 30 tablet 9  .  lisinopril (PRINIVIL,ZESTRIL) 10 MG tablet TAKE 2 TABLETS (20 MG TOTAL) BY MOUTH DAILY. 60 tablet 5  . nitroGLYCERIN (NITROSTAT) 0.4 MG SL tablet Place 0.4 mg under the tongue every 5 (five) minutes as needed.      . ranitidine (ZANTAC) 300 MG tablet TAKE 1 TABLET (300 MG TOTAL) BY MOUTH AT BEDTIME. 90 tablet 1  . sertraline (ZOLOFT) 100 MG tablet TAKE 1 TABLET BY MOUTH EVERY DAY 30 tablet 5  . SPIRIVA HANDIHALER 18 MCG inhalation capsule PLACE 1 CAPSULE (18 MCG TOTAL) INTO INHALER AND INHALE DAILY. 90 capsule 2  . terconazole (TERAZOL 3) 0.8 % vaginal cream Place 1 applicator vaginally at bedtime. 20 g 0  . traZODone (DESYREL) 50 MG tablet TAKE 1/2 TO 1 TABLET AT BEDTIME AS NEEDED FOR SLEEP 30 tablet 0  . vitamin B-12 (CYANOCOBALAMIN) 1000 MCG tablet Take 1,000 mcg by mouth daily.    . Vitamin D, Ergocalciferol, (DRISDOL) 50000 UNITS CAPS capsule TAKE 1 CAPSULE (50,000 UNITS TOTAL) BY MOUTH EVERY 7 (SEVEN) DAYS. 6 capsule 3   No current facility-administered medications on file prior to visit.    Allergies  Allergen Reactions  . Codeine Other (See Comments)    Coma for 2 days  . Prednisone Other (See Comments)    Weight gain,puffy face  . Shellfish Allergy  Past Medical History  Diagnosis Date  . HTN (hypertension)   . COPD (chronic obstructive pulmonary disease) ([place])   . Depression   . Thyroid disease   . PMR (polymyalgia rheumatica) ([facility])     with h/o steroid induced myopathy  . Anxiety   . Diastolic dysfunction     Echo 05/11/2010, EF 65-70%  . TIA (transient ischemic attack)     11/05 s/p L CEA  . Kidney infection   . Carotid artery occlusion   . CAD (coronary artery disease)     h/o NSTEMI, LAD stent placement 10/04, cath 05/11/2010  . HLD (hyperlipidemia)     Past Surgical History  Procedure Laterality Date  . Cholecystectomy    . Carotid endarterectomy      left with stent placement 11/2003  . Iliac artery stent      right, 2001  . Cardiac catheterization       unc  . Cardiac catheterization    . Cardiac catheterization    . Lung biopsy      No family history on file.  Social History   Social History  . Marital Status: Married    Spouse Name: N/A  . Number of Children: N/A  . Years of Education: N/A   Occupational History  . retired     Pharmacist, hospital   Social History Main Topics  . Smoking status: Former Smoker -- 1.00 packs/day for 25 years    Types: Cigarettes    Quit date: 01/05/1996  . Smokeless tobacco: Never Used  . Alcohol Use: No  . Drug Use: No  . Sexual Activity: Not on file   Other Topics Concern  . Not on file   Social History Narrative   Lives with husband, Joy [name] at [place].   Daughters, Joy [name] and Joy [name] very involved.   The PMH, PSH, Social History, Family History, Medications, and allergies have been reviewed in [facility], and have been updated if relevant.  Review of Systems  Constitutional: Positive for fatigue. Negative for fever and unexpected weight change.  Musculoskeletal: Negative.   Psychiatric/Behavioral: Negative for suicidal ideas, hallucinations, behavioral problems, confusion, sleep disturbance, self-injury, dysphoric mood, decreased concentration and agitation. The patient is not nervous/anxious and is not hyperactive.   All other systems reviewed and are negative.      Objective:    BP 110/54 mmHg  Pulse 74  Temp(Src) 97.9 F (36.6 C) (Oral)  Wt 197 lb (89.359 kg)  SpO2 91%  Wt Readings from Last 3 Encounters:  11/04/14 197 lb (89.359 kg)  09/23/14 195 lb (88.451 kg)  09/13/14 200 lb (90.719 kg)    Physical Exam  Constitutional: She is oriented to person, place, and time. She appears well-developed and well-nourished. No distress.  O2 per [place]  HENT:  Head: Normocephalic.  Eyes: Conjunctivae are normal.  Cardiovascular: Normal rate.   Pulmonary/Chest: Effort normal.  Musculoskeletal: Normal range of motion.  Neurological: She is alert and oriented to person, place, and time. No  cranial nerve deficit.  Skin: Skin is warm and dry. She is not diaphoretic.  Psychiatric: She has a normal mood and affect. Her behavior is normal. Judgment and thought content normal.  Nursing note and vitals reviewed.         Assessment & Plan:   Allergic rhinitis, unspecified allergic rhinitis type  HLD (hyperlipidemia) - Plan: Lipid panel, Comprehensive metabolic panel  Depression No Follow-up on file.

## 2014-11-04 NOTE — Assessment & Plan Note (Signed)
Continue current dose of lipitor. Check labs today. ? Decrease dose of LDL remains low. The patient indicates understanding of these issues and agrees with the plan.

## 2014-11-04 NOTE — Assessment & Plan Note (Signed)
>  25 minutes spent in face to face time with patient, >50% spent in counselling or coordination of care [name] declines speaking with Dr. Rexene Edison again and we agreed to not yet adjust rxs since she does not "feel depressed." Continue current rx and she will keep me updated with her symptoms over the next 1-2 months. The patient indicates understanding of these issues and agrees with the plan.

## 2014-11-10 ENCOUNTER — Other Ambulatory Visit: Payer: Self-pay | Admitting: Family Medicine

## 2014-11-11 ENCOUNTER — Other Ambulatory Visit: Payer: Self-pay | Admitting: Family Medicine

## 2014-11-15 ENCOUNTER — Other Ambulatory Visit: Payer: Self-pay | Admitting: Family Medicine

## 2014-11-17 NOTE — Telephone Encounter (Signed)
Last office visit 11/04/2014.  Last refilled 10/18/2014 for #30 with no refills.  Ok to refill?

## 2014-11-23 ENCOUNTER — Other Ambulatory Visit: Payer: Self-pay | Admitting: Family Medicine

## 2014-11-26 ENCOUNTER — Other Ambulatory Visit: Payer: Self-pay | Admitting: Family Medicine

## 2014-11-26 NOTE — Telephone Encounter (Signed)
Is pt needing to continue high dose? 

## 2014-11-27 ENCOUNTER — Other Ambulatory Visit: Payer: Self-pay | Admitting: Family Medicine

## 2014-11-27 MED ORDER — TRAZODONE HCL 50 MG PO TABS
ORAL_TABLET | ORAL | Status: DC
Start: 2014-11-27 — End: 2015-01-10

## 2014-11-27 NOTE — Addendum Note (Signed)
Addended by: Helene Shoe on: 11/27/2014 02:21 PM   Modules accepted: Orders

## 2014-11-27 NOTE — Telephone Encounter (Signed)
Joy [name] (DPR signed_)Prequesting status of trazodone refill; spoke with Huelsmann Landry at [facility] and he did not get trazodone sent on 11/18/14. Medication phoned to Kipnis Landry at [facility] as instructed. Joy [name] notified and will ck with pharmacy later today.

## 2014-11-27 NOTE — Telephone Encounter (Signed)
Last office visit 11/04/2014.  Last refilled

## 2014-11-30 ENCOUNTER — Other Ambulatory Visit: Payer: Self-pay | Admitting: Family Medicine

## 2014-12-02 DIAGNOSIS — H401122 Primary open-angle glaucoma, left eye, moderate stage: Secondary | ICD-10-CM | POA: Diagnosis not present

## 2014-12-06 ENCOUNTER — Telehealth: Payer: Self-pay | Admitting: Family Medicine

## 2014-12-06 NOTE — Telephone Encounter (Signed)
Pt's daughter [name] dropped off an application for [place] at [facility] to be filled out. You can reach her at [phone]. Placing ppw on Dr. Hulen Shouts desk.

## 2014-12-10 DIAGNOSIS — Z7689 Persons encountering health services in other specified circumstances: Secondary | ICD-10-CM

## 2014-12-10 NOTE — Telephone Encounter (Signed)
Form completed and on my desk. 

## 2014-12-19 ENCOUNTER — Other Ambulatory Visit: Payer: Self-pay | Admitting: Family Medicine

## 2014-12-19 DIAGNOSIS — E785 Hyperlipidemia, unspecified: Secondary | ICD-10-CM

## 2014-12-21 ENCOUNTER — Other Ambulatory Visit: Payer: Self-pay | Admitting: Family Medicine

## 2014-12-23 DIAGNOSIS — F0390 Unspecified dementia without behavioral disturbance: Secondary | ICD-10-CM | POA: Diagnosis not present

## 2015-01-01 ENCOUNTER — Other Ambulatory Visit: Payer: Self-pay

## 2015-01-02 ENCOUNTER — Encounter: Payer: Self-pay | Admitting: *Deleted

## 2015-01-02 ENCOUNTER — Other Ambulatory Visit (INDEPENDENT_AMBULATORY_CARE_PROVIDER_SITE_OTHER): Payer: Medicare Other

## 2015-01-02 DIAGNOSIS — E785 Hyperlipidemia, unspecified: Secondary | ICD-10-CM | POA: Diagnosis not present

## 2015-01-02 LAB — COMPREHENSIVE METABOLIC PANEL
ALK PHOS: 128 U/L — AB (ref 39–117)
ALT: 10 U/L (ref 0–35)
AST: 30 U/L (ref 0–37)
Albumin: 4 g/dL (ref 3.5–5.2)
BILIRUBIN TOTAL: 0.8 mg/dL (ref 0.2–1.2)
BUN: 21 mg/dL (ref 6–23)
CO2: 34 mEq/L — ABNORMAL HIGH (ref 19–32)
Calcium: 9.7 mg/dL (ref 8.4–10.5)
Chloride: 99 mEq/L (ref 96–112)
Creatinine, Ser: 1.39 mg/dL — ABNORMAL HIGH (ref 0.40–1.20)
GFR: 38.58 mL/min — ABNORMAL LOW (ref >60.00)
GLUCOSE: 87 mg/dL (ref 70–99)
POTASSIUM: 4.9 meq/L (ref 3.5–5.1)
SODIUM: 144 meq/L (ref 135–145)
TOTAL PROTEIN: 6.6 g/dL (ref 6.0–8.3)

## 2015-01-02 LAB — LIPID PANEL
Cholesterol: 180 mg/dL (ref 0–200)
HDL: 41.6 mg/dL (ref >39.00)
NONHDL: 138.79
Total CHOL/HDL Ratio: 4
Triglycerides: 263 mg/dL — ABNORMAL HIGH (ref 0.0–149.0)
VLDL: 52.6 mg/dL — ABNORMAL HIGH (ref 0.0–40.0)

## 2015-01-02 LAB — LDL CHOLESTEROL, DIRECT: Direct LDL: 99 mg/dL

## 2015-01-07 DIAGNOSIS — J449 Chronic obstructive pulmonary disease, unspecified: Secondary | ICD-10-CM | POA: Diagnosis not present

## 2015-01-07 DIAGNOSIS — J9611 Chronic respiratory failure with hypoxia: Secondary | ICD-10-CM | POA: Diagnosis not present

## 2015-01-07 DIAGNOSIS — R0602 Shortness of breath: Secondary | ICD-10-CM | POA: Diagnosis not present

## 2015-01-10 ENCOUNTER — Other Ambulatory Visit: Payer: Self-pay | Admitting: *Deleted

## 2015-01-10 MED ORDER — TRAZODONE HCL 50 MG PO TABS: ORAL_TABLET | ORAL | Status: DC

## 2015-01-15 ENCOUNTER — Emergency Department: Payer: Medicare Other

## 2015-01-15 ENCOUNTER — Emergency Department
Admission: EM | Admit: 2015-01-15 | Discharge: 2015-01-15 | Disposition: A | Discharge status: Home / Self Care | Payer: Medicare Other | Attending: Emergency Medicine | Admitting: Emergency Medicine

## 2015-01-15 DIAGNOSIS — R0602 Shortness of breath: Secondary | ICD-10-CM | POA: Diagnosis not present

## 2015-01-15 DIAGNOSIS — R0789 Other chest pain: Secondary | ICD-10-CM | POA: Diagnosis not present

## 2015-01-15 DIAGNOSIS — F419 Anxiety disorder, unspecified: Secondary | ICD-10-CM | POA: Diagnosis not present

## 2015-01-15 DIAGNOSIS — Z79899 Other long term (current) drug therapy: Secondary | ICD-10-CM | POA: Insufficient documentation

## 2015-01-15 DIAGNOSIS — J441 Chronic obstructive pulmonary disease with (acute) exacerbation: Secondary | ICD-10-CM | POA: Diagnosis not present

## 2015-01-15 DIAGNOSIS — R079 Chest pain, unspecified: Secondary | ICD-10-CM | POA: Insufficient documentation

## 2015-01-15 DIAGNOSIS — N183 Chronic kidney disease, stage 3 (moderate): Secondary | ICD-10-CM | POA: Insufficient documentation

## 2015-01-15 DIAGNOSIS — Z87891 Personal history of nicotine dependence: Secondary | ICD-10-CM | POA: Insufficient documentation

## 2015-01-15 DIAGNOSIS — I129 Hypertensive chronic kidney disease with stage 1 through stage 4 chronic kidney disease, or unspecified chronic kidney disease: Secondary | ICD-10-CM | POA: Diagnosis not present

## 2015-01-15 DIAGNOSIS — N179 Acute kidney failure, unspecified: Secondary | ICD-10-CM | POA: Diagnosis not present

## 2015-01-15 HISTORY — DX: Dementia in other diseases classified elsewhere, unspecified severity, without behavioral disturbance, psychotic disturbance, mood disturbance, and anxiety: F02.80

## 2015-01-15 HISTORY — DX: Alzheimer's disease, unspecified: G30.9

## 2015-01-15 LAB — BASIC METABOLIC PANEL
ANION GAP: 8 (ref 5–15)
BUN: 22 mg/dL — ABNORMAL HIGH (ref 6–20)
CALCIUM: 8.7 mg/dL — AB (ref 8.9–10.3)
CO2: 32 mmol/L (ref 22–32)
Chloride: 101 mmol/L (ref 101–111)
Creatinine, Ser: 1.47 mg/dL — ABNORMAL HIGH (ref 0.44–1.00)
GFR, EST AFRICAN AMERICAN: 37 mL/min — AB (ref >60)
GFR, EST NON AFRICAN AMERICAN: 32 mL/min — AB (ref >60)
Glucose, Bld: 119 mg/dL — ABNORMAL HIGH (ref 65–99)
Potassium: 4.5 mmol/L (ref 3.5–5.1)
SODIUM: 141 mmol/L (ref 135–145)

## 2015-01-15 LAB — CBC
HCT: 34.7 % — ABNORMAL LOW (ref 35.0–47.0)
HEMOGLOBIN: 11.4 g/dL — AB (ref 12.0–16.0)
MCH: 29.4 pg (ref 26.0–34.0)
MCHC: 32.9 g/dL (ref 32.0–36.0)
MCV: 89.6 fL (ref 80.0–100.0)
Platelets: 207 10*3/uL (ref 150–440)
RBC: 3.87 MIL/uL (ref 3.80–5.20)
RDW: 15.7 % — ABNORMAL HIGH (ref 11.5–14.5)
WBC: 9.2 10*3/uL (ref 3.6–11.0)

## 2015-01-15 LAB — TROPONIN I

## 2015-01-15 MED ORDER — SODIUM CHLORIDE 0.9 % IV BOLUS (SEPSIS)
1000.0000 mL | Freq: Once | INTRAVENOUS | Status: AC
  Administered 2015-01-15: 1000 mL via INTRAVENOUS

## 2015-01-15 NOTE — ED Notes (Signed)
Called lab per Dr. Alinda Money request to make sure Troponin was drawn off green top sent down at 8p instead of an add on to blood tubes sent down during triage. Lab confirmed it was from new green top. Dr. Reita Cliche notified.

## 2015-01-15 NOTE — Discharge Instructions (Signed)
You were evaluated after episode of shortness of breath and chest discomfort, and although no certain cause was found, your exam and evaluation are reassuring today in the emergency department.  You're found to have mild dehydration were given IV fluids here in the emergency department.  Return to the emergency department for any worsening condition including any new or worsening chest pain, trouble breathing, shortness breath, weakness, numbness, passing out, palpitations, or any other symptoms concerning to you.   Nonspecific Chest Pain  Chest pain can be caused by many different conditions. There is always a chance that your pain could be related to something serious, such as a heart attack or a blood clot in your lungs. Chest pain can also be caused by conditions that are not life-threatening. If you have chest pain, it is very important to follow up with your health care provider. CAUSES  Chest pain can be caused by:  Heartburn.  Pneumonia or bronchitis.  Anxiety or stress.  Inflammation around your heart (pericarditis) or lung (pleuritis or pleurisy).  A blood clot in your lung.  A collapsed lung (pneumothorax). It can develop suddenly on its own (spontaneous pneumothorax) or from trauma to the chest.  Shingles infection (varicella-zoster virus).  Heart attack.  Damage to the bones, muscles, and cartilage that make up your chest wall. This can include:  Bruised bones due to injury.  Strained muscles or cartilage due to frequent or repeated coughing or overwork.  Fracture to one or more ribs.  Sore cartilage due to inflammation (costochondritis). RISK FACTORS  Risk factors for chest pain may include:  Activities that increase your risk for trauma or injury to your chest.  Respiratory infections or conditions that cause frequent coughing.  Medical conditions or overeating that can cause heartburn.  Heart disease or family history of heart disease.  Conditions or  health behaviors that increase your risk of developing a blood clot.  Having had chicken pox (varicella zoster). SIGNS AND SYMPTOMS Chest pain can feel like:  Burning or tingling on the surface of your chest or deep in your chest.  Crushing, pressure, aching, or squeezing pain.  Dull or sharp pain that is worse when you move, cough, or take a deep breath.  Pain that is also felt in your back, neck, shoulder, or arm, or pain that spreads to any of these areas. Your chest pain may come and go, or it may stay constant. DIAGNOSIS Lab tests or other studies may be needed to find the cause of your pain. Your health care provider may have you take a test called an ambulatory ECG (electrocardiogram). An ECG records your heartbeat patterns at the time the test is performed. You may also have other tests, such as:  Transthoracic echocardiogram (TTE). During echocardiography, sound waves are used to create a picture of all of the heart structures and to look at how blood flows through your heart.  Transesophageal echocardiogram (TEE).This is a more advanced imaging test that obtains images from inside your body. It allows your health care provider to see your heart in finer detail.  Cardiac monitoring. This allows your health care provider to monitor your heart rate and rhythm in real time.  Holter monitor. This is a portable device that records your heartbeat and can help to diagnose abnormal heartbeats. It allows your health care provider to track your heart activity for several days, if needed.  Stress tests. These can be done through exercise or by taking medicine that makes your heart beat  more quickly.  Blood tests.  Imaging tests. TREATMENT  Your treatment depends on what is causing your chest pain. Treatment may include:  Medicines. These may include:  Acid blockers for heartburn.  Anti-inflammatory medicine.  Pain medicine for inflammatory conditions.  Antibiotic medicine, if  an infection is present.  Medicines to dissolve blood clots.  Medicines to treat coronary artery disease.  Supportive care for conditions that do not require medicines. This may include:  Resting.  Applying heat or cold packs to injured areas.  Limiting activities until pain decreases. HOME CARE INSTRUCTIONS  If you were prescribed an antibiotic medicine, finish it all even if you start to feel better.  Avoid any activities that bring on chest pain.  Do not use any tobacco products, including cigarettes, chewing tobacco, or electronic cigarettes. If you need help quitting, ask your health care provider.  Do not drink alcohol.  Take medicines only as directed by your health care provider.  Keep all follow-up visits as directed by your health care provider. This is important. This includes any further testing if your chest pain does not go away.  If heartburn is the cause for your chest pain, you may be told to keep your head raised (elevated) while sleeping. This reduces the chance that acid will go from your stomach into your esophagus.  Make lifestyle changes as directed by your health care provider. These may include:  Getting regular exercise. Ask your health care provider to suggest some activities that are safe for you.  Eating a heart-healthy diet. A registered dietitian can help you to learn healthy eating options.  Maintaining a healthy weight.  Managing diabetes, if necessary.  Reducing stress. SEEK MEDICAL CARE IF:  Your chest pain does not go away after treatment.  You have a rash with blisters on your chest.  You have a fever. SEEK IMMEDIATE MEDICAL CARE IF:   Your chest pain is worse.  You have an increasing cough, or you cough up blood.  You have severe abdominal pain.  You have severe weakness.  You faint.  You have chills.  You have sudden, unexplained chest discomfort.  You have sudden, unexplained discomfort in your arms, back, neck, or  jaw.  You have shortness of breath at any time.  You suddenly start to sweat, or your skin gets clammy.  You feel nauseous or you vomit.  You suddenly feel light-headed or dizzy.  Your heart begins to beat quickly, or it feels like it is skipping beats. These symptoms may represent a serious problem that is an emergency. Do not wait to see if the symptoms will go away. Get medical help right away. Call your local emergency services (911 in the [place] Do not drive yourself to the hospital.   This information is not intended to replace advice given to you by your health care provider. Make sure you discuss any questions you have with your health care provider.   Document Released: 09/30/2004 Document Revised: 01/11/2014 Document Reviewed: 07/27/2013 Elsevier Interactive Patient Education 2016 Elsevier Inc.   Acute Kidney Injury Acute kidney injury is any condition in which there is sudden (acute) damage to the kidneys. Acute kidney injury was previously known as acute kidney failure or acute renal failure. The kidneys are two organs that lie on either side of the spine between the middle of the back and the front of the abdomen. The kidneys:  Remove wastes and extra water from the blood.   Produce important hormones. These help keep  bones strong, regulate blood pressure, and help create red blood cells.   Balance the fluids and chemicals in the blood and tissues. A small amount of kidney damage may not cause problems, but a large amount of damage may make it difficult or impossible for the kidneys to work the way they should. Acute kidney injury may develop into long-lasting (chronic) kidney disease. It may also develop into a life-threatening disease called end-stage kidney disease. Acute kidney injury can get worse very quickly, so it should be treated right away. Early treatment may prevent other kidney diseases from developing. CAUSES   A problem with blood flow to the kidneys.  This may be caused by:   Blood loss.   Heart disease.   Severe burns.   Liver disease.  Direct damage to the kidneys. This may be caused by:  Some medicines.   A kidney infection.   Poisoning or consuming toxic substances.   A surgical wound.   A blow to the kidney area.   A problem with urine flow. This may be caused by:   Cancer.   Kidney stones.   An enlarged prostate. SIGNS AND SYMPTOMS   Swelling (edema) of the legs, ankles, or feet.   Tiredness (lethargy).   Nausea or vomiting.   Confusion.   Problems with urination, such as:   Painful or burning feeling during urination.   Decreased urine production.   Frequent accidents in children who are potty trained.   Bloody urine.   Muscle twitches and cramps.   Shortness of breath.   Seizures.   Chest pain or pressure. Sometimes, no symptoms are present. DIAGNOSIS Acute kidney injury may be detected and diagnosed by tests, including blood, urine, imaging, or kidney biopsy tests.  TREATMENT Treatment of acute kidney injury varies depending on the cause and severity of the kidney damage. In mild cases, no treatment may be needed. The kidneys may heal on their own. If acute kidney injury is more severe, your health care provider will treat the cause of the kidney damage, help the kidneys heal, and prevent complications from occurring. Severe cases may require a procedure to remove toxic wastes from the body (dialysis) or surgery to repair kidney damage. Surgery may involve:   Repair of a torn kidney.   Removal of an obstruction. HOME CARE INSTRUCTIONS  Follow your prescribed diet.  Take medicines only as directed by your health care provider.  Do not take any new medicines (prescription, over-the-counter, or nutritional supplements) unless approved by your health care provider. Many medicines can worsen your kidney damage or may need to have the dose adjusted.   Keep all  follow-up visits as directed by your health care provider. This is important.  Observe your condition to make sure you are healing as expected. SEEK IMMEDIATE MEDICAL CARE IF:  You are feeling ill or have severe pain in the back or side.   Your symptoms return or you have new symptoms.  You have any symptoms of end-stage kidney disease. These include:   Persistent itchiness.   Loss of appetite.   Headaches.   Abnormally dark or light skin.  Numbness in the hands or feet.   Easy bruising.   Frequent hiccups.   Menstruation stops.   You have a fever.  You have increased urine production.  You have pain or bleeding when urinating. MAKE SURE YOU:   Understand these instructions.  Will watch your condition.  Will get help right away if you are not doing  well or get worse.   This information is not intended to replace advice given to you by your health care provider. Make sure you discuss any questions you have with your health care provider.   Document Released: 07/06/2010 Document Revised: 01/11/2014 Document Reviewed: 08/20/2011 Elsevier Interactive Patient Education [employer].

## 2015-01-15 NOTE — ED Provider Notes (Signed)
[facility] Emergency Department Provider Note   ____________________________________________  Time seen: Approximately 7:30 PM I have reviewed the triage vital signs and the triage nursing note.  HISTORY  Chief Complaint Shortness of Breath; Back Pain; and Chest Pain   Historian Patient is a limited historian due to some mild dementia Daughter provides some history  HPI Joy [name] is a 82 y.o. female who has a history of COPD and wears 2-3 L of O2 all the time, as well as a history of coronary artery disease and stent placement, as well as a history of Alzheimer's dementia, who is here for evaluation of episode of shortness of breath with chest discomfort which occurred somewhere between 1 and 4:30 this afternoon. She was at adult daycare program and when her daughter came to pick her up at 1 she was somewhat anxious and feeling short of breath. This was at the time when her oxygen was being switched to a new tank that was a pulse rather than a continuous flow and she was feeling quite anxious that she wasn't getting enough oxygen. She was also experiencing some mild central chest pressure through to her back. It sounds like the shortness of breath didn't last too long, but the chest discomfort lasted a few hours. No chest discomfort now. No shortness of breath now. No recent illness such as coughing or congestion or fever or passing out. Symptoms are moderate at the time. Gone now.  Past Medical History  Diagnosis Date  . HTN (hypertension)   . COPD (chronic obstructive pulmonary disease) ([place])   . Depression   . Thyroid disease   . PMR (polymyalgia rheumatica) ([facility]CC)     with h/o steroid induced myopathy  . Anxiety   . Diastolic dysfunction     Echo 05/11/2010, EF 65-70%  . TIA (transient ischemic attack)     11/05 s/p L CEA  . Kidney infection   . Carotid artery occlusion   . CAD (coronary artery disease)     h/o NSTEMI, LAD stent placement 10/04, cath  05/11/2010  . HLD (hyperlipidemia)   . Alzheimer disease     Patient Active Problem List   Diagnosis Date Noted  . Allergic rhinitis 11/04/2014  . Chronic diastolic heart failure ([place]) 07/22/2014  . CKD (chronic kidney disease) stage 3, GFR 30-59 ml/min 06/11/2014  . Carotid artery disease ([place]) 04/08/2014  . Nevus of cheek 10/30/2013  . CAD (coronary artery disease)   . HLD (hyperlipidemia)   . Lung nodule 01/15/2013  . Memory loss 01/15/2013  . Weakness generalized 12/26/2012  . Incontinence 12/26/2012  . GERD (gastroesophageal reflux disease) 09/06/2012  . Neuropathy ([place]) 01/14/2011  . Insomnia 01/14/2011  . 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 HTN (hypertension)   . COPD (chronic obstructive pulmonary disease) ([place])   . Depression   . Thyroid disease     Past Surgical History  Procedure Laterality Date  . Cholecystectomy    . Carotid endarterectomy      left with stent placement 11/2003  . Iliac artery stent      right, 2001  . Cardiac catheterization      unc  . Cardiac catheterization    . Cardiac catheterization    . Lung biopsy      Current Outpatient Rx  Name  Route  Sig  Dispense  Refill  . ADVAIR DISKUS 250-50 MCG/DOSE AEPB      INHALE 1 PUFF BY MOUTH TWICE  A DAY   60 each   0   . albuterol (PROVENTIL HFA;VENTOLIN HFA) 108 (90 BASE) MCG/ACT inhaler   Inhalation   Inhale 2 puffs into the lungs every 6 (six) hours as needed for wheezing.   1 Inhaler   0   . amLODipine (NORVASC) 10 MG tablet   Oral   Take 10 mg by mouth daily.         Marland Kitchen atorvastatin (LIPITOR) 40 MG tablet      TAKE 1 TABLET BY MOUTH EVERY DAY   30 tablet   3   . clopidogrel (PLAVIX) 75 MG tablet      TAKE 1 TABLET BY MOUTH EVERY DAY   30 tablet   5   . furosemide (LASIX) 40 MG tablet   Oral   Take 40 mg by mouth daily.         Marland Kitchen gabapentin (NEURONTIN) 800 MG tablet      TAKE 1 TABLET BY MOUTH TWICE A  DAY   60 tablet   6   . levothyroxine (SYNTHROID, LEVOTHROID) 25 MCG tablet      TAKE 1 TABLET BY MOUTH EVERY DAY   30 tablet   9   . lisinopril (PRINIVIL,ZESTRIL) 10 MG tablet      TAKE 2 TABLETS (20 MG TOTAL) BY MOUTH DAILY.   60 tablet   5   . nitroGLYCERIN (NITROSTAT) 0.4 MG SL tablet   Sublingual   Place 0.4 mg under the tongue every 5 (five) minutes as needed.           . ranitidine (ZANTAC) 300 MG tablet      TAKE 1 TABLET (300 MG TOTAL) BY MOUTH AT BEDTIME.   90 tablet   1   . sertraline (ZOLOFT) 100 MG tablet      TAKE 1 TABLET BY MOUTH EVERY DAY   30 tablet   5   . SPIRIVA HANDIHALER 18 MCG inhalation capsule      PLACE 1 CAPSULE (18 MCG TOTAL) INTO INHALER AND INHALE DAILY.   30 capsule   5   . terconazole (TERAZOL 3) 0.8 % vaginal cream   Vaginal   Place 1 applicator vaginally at bedtime.   20 g   0   . traZODone (DESYREL) 50 MG tablet      TAKE 1/2 TO 1 TABLET AT BEDTIME AS NEEDED FOR SLEEP   30 tablet   1   . vitamin B-12 (CYANOCOBALAMIN) 1000 MCG tablet   Oral   Take 1,000 mcg by mouth daily.         . Vitamin D, Ergocalciferol, (DRISDOL) 50000 UNITS CAPS capsule      TAKE 1 CAPSULE BY MOUTH EVERY 7 DAYS   6 capsule   3     Allergies Codeine; Prednisone; and Shellfish allergy  History reviewed. No pertinent family history.  Social History Social History  Substance Use Topics  . Smoking status: Former Smoker -- 1.00 packs/day for 25 years    Types: Cigarettes    Quit date: 01/05/1996  . Smokeless tobacco: Never Used  . Alcohol Use: No    Review of Systems  Constitutional: Negative for fever. Eyes: Negative for visual changes. ENT: Negative for sore throat. Cardiovascular: No palpitations. Respiratory: No cough. No pleuritic chest pain. Gastrointestinal: Negative for abdominal pain, vomiting and diarrhea. Genitourinary: Negative for dysuria. Musculoskeletal: Negative for back pain. Skin: Negative for  rash. Neurological: Negative for headache. 10 point Review of Systems otherwise negative  ____________________________________________   PHYSICAL EXAM:  VITAL SIGNS: ED Triage Vitals  Enc Vitals Group     BP 01/15/15 1620 108/55 mmHg     Pulse Rate 01/15/15 1620 72     Resp 01/15/15 1620 18     Temp 01/15/15 1620 98.1 F (36.7 C)     Temp Source 01/15/15 1620 Oral     SpO2 01/15/15 1620 92 %     Weight --      Height --      Head Cir --      Peak Flow --      Pain Score 01/15/15 1617 8     Pain Loc --      Pain Edu? --      Excl. in [place]? --      Constitutional: Alert and oriented. Well appearing and in no distress. Eyes: Conjunctivae are normal. PERRL. Normal extraocular movements. ENT   Head: Normocephalic and atraumatic.   Nose: No congestion/rhinnorhea.   Mouth/Throat: Mucous membranes are moderately dry..   Neck: No stridor. Cardiovascular/Chest: Normal rate, regular rhythm.  No murmurs, rubs, or gallops. Respiratory: Normal respiratory effort without tachypnea nor retractions. Breath sounds are clear and equal bilaterally. No wheezes/rales/rhonchi. Gastrointestinal: Soft. No distention, no guarding, no rebound. Nontender.    Genitourinary/rectal:Deferred Musculoskeletal: Nontender with normal range of motion in all extremities. No joint effusions.  No lower extremity tenderness.  No edema. Neurologic:  Normal speech and language. No gross or focal neurologic deficits are appreciated. Skin:  Skin is warm, dry and intact. No rash noted. Psychiatric: Mood and affect are normal. Speech and behavior are normal. Patient exhibits appropriate insight and judgment.  ____________________________________________   EKG I, Lisa Roca, MD, the attending physician have personally viewed and interpreted all ECGs.  71 bpm. Normal sinus rhythm. Narrow QRS. Normal axis. Normal ST and T-wave ____________________________________________  LABS (pertinent  positives/negatives)  Basic metabolic panel significant for BUN 22 and creatinine 1.47 White blood count 9.2, hemoglobin 11.4 and platelet count 207  Troponin draw 20:43 --less than 0.03  ____________________________________________  RADIOLOGY All Xrays were viewed by me. Imaging interpreted by Radiologist.  Chest x-ray two-view: No acute abnormalities noted __________________________________________  PROCEDURES  Procedure(s) performed: None  Critical Care performed: None  ____________________________________________   ED COURSE / ASSESSMENT AND PLAN  CONSULTATIONS: None  Pertinent labs & imaging results that were available during my care of the patient were reviewed by me and considered in my medical decision making (see chart for details).   Patient has been waiting for some time and is now asymptomatic. Daughter states that the [facility] nurse did evaluate the patient and felt that she should be evaluated for the chest discomfort.  Her EKG is reassuring. Her laboratory studies including troponin are reassuring.  Her BUN and creatinine are slightly elevated consistent with mild acute on chronic kidney failure and clinically this seems to be due to some dehydration. Patient was given 1 L normal saline bolus and return.  Clinically patient has no symptoms of congestive heart failure and does not report a history of congestive heart failure.  Patient okay for discharge home tonight and outpatient follow-up with her primary care physician and her cardiologist.  Patient / Family / Caregiver informed of clinical course, medical decision-making process, and agree with plan.   I discussed return precautions, follow-up instructions, and discharged instructions with patient and/or family.  ___________________________________________   FINAL CLINICAL IMPRESSION(S) / ED DIAGNOSES   Final diagnoses:  Chest pain, unspecified  Acute renal failure,  unspecified acute renal failure type [facility])              Note: This dictation was prepared with Dragon dictation. Any transcriptional errors that result from this process are unintentional   Lisa Roca, MD 01/15/15 2105

## 2015-01-15 NOTE — ED Notes (Signed)
Pt presents from [facility] with Sob, chest tightness, and back pain that began today. Pt states she also had these symptoms last Wednesday. Daughter at bedside.

## 2015-01-15 NOTE — ED Notes (Signed)
Pt here for SOB, chest pain, back pain and neck pain. Has same symptoms last week and then they returned today.

## 2015-01-16 ENCOUNTER — Telehealth: Payer: Self-pay | Admitting: *Deleted

## 2015-01-16 NOTE — Telephone Encounter (Signed)
lmov to schedule ED fu visit with Dr Fletcher Anon

## 2015-02-03 ENCOUNTER — Ambulatory Visit: Payer: Medicare Other | Admitting: Family Medicine

## 2015-02-06 ENCOUNTER — Ambulatory Visit: Payer: Self-pay | Admitting: Family Medicine

## 2015-02-13 ENCOUNTER — Ambulatory Visit (INDEPENDENT_AMBULATORY_CARE_PROVIDER_SITE_OTHER): Payer: Medicare Other | Admitting: Family Medicine

## 2015-02-13 VITALS — BP 142/76 | HR 88 | Temp 98.1°F | Wt 197.8 lb

## 2015-02-13 DIAGNOSIS — R079 Chest pain, unspecified: Secondary | ICD-10-CM

## 2015-02-13 DIAGNOSIS — N183 Chronic kidney disease, stage 3 unspecified: Secondary | ICD-10-CM

## 2015-02-13 LAB — BASIC METABOLIC PANEL
BUN: 22 mg/dL (ref 6–23)
CO2: 36 mEq/L — ABNORMAL HIGH (ref 19–32)
Calcium: 9.3 mg/dL (ref 8.4–10.5)
Chloride: 104 mEq/L (ref 96–112)
Creatinine, Ser: 1.13 mg/dL (ref 0.40–1.20)
GFR: 48.98 mL/min — AB (ref >60.00)
GLUCOSE: 97 mg/dL (ref 70–99)
POTASSIUM: 4.4 meq/L (ref 3.5–5.1)
Sodium: 148 mEq/L — ABNORMAL HIGH (ref 135–145)

## 2015-02-13 NOTE — Progress Notes (Signed)
Subjective:   Patient ID: Joy [name], female    DOB: 1930/09/03, 82 y.o.   MRN: [id number]  Joy [name] is a pleasant 82 y.o. year old female with h/o O2 dependent COPD on 2-3 L of O2 at baseline, CAD s/p stent placement, Alzheimer's dementia, who presents to clinic today with her daughter [name] for Hospitalization Follow-up  on 02/13/2015  HPI:  Presented to [facility] ER on 01/15/15 for SOB, Back pain, CP. Notes, images and studies reviewed.  Acute episodes of CP and SOB that prompted ER visit occurred while she was at the adult daycare facility.  Her oxygen was being switched to a new tank and she became anxious and SOB.  Chest discomfort, however, lasted for a few hours afterwards so family took her to ER.  By the time she was seen in ED, symptoms had resolved.  CXR showed no acute findings. EKG- NSR Cr elevated to 1.47- acute on chronic renal failure.  Given I L bolus and advised follow up with myself and cardiology. No current chest pain.  Recent Results (from the past 2160 hour(s))  Lipid panel     Status: Abnormal   Collection Time: 01/02/15 11:03 AM  Result Value Ref Range   Cholesterol 180 0 - 200 mg/dL    Comment: ATP III Classification       Desirable:  < 200 mg/dL               Borderline High:  200 - 239 mg/dL          High:  > = 240 mg/dL   Triglycerides 263.0 (H) 0.0 - 149.0 mg/dL    Comment: Normal:  <150 mg/dLBorderline High:  150 - 199 mg/dL   HDL 41.60 >39.00 mg/dL   VLDL 52.6 (H) 0.0 - 40.0 mg/dL   Total CHOL/HDL Ratio 4     Comment:                Men          Women1/2 Average Risk     3.4          3.3Average Risk          5.0          4.42X Average Risk          9.6          7.13X Average Risk          15.0          11.0                       NonHDL 138.79     Comment: NOTE:  Non-HDL goal should be 30 mg/dL higher than patient's LDL goal (i.e. LDL goal of < 70 mg/dL, would have non-HDL goal of < 100 mg/dL)  Comprehensive metabolic panel     Status: Abnormal    Collection Time: 01/02/15 11:03 AM  Result Value Ref Range   Sodium 144 135 - 145 mEq/L   Potassium 4.9 3.5 - 5.1 mEq/L   Chloride 99 96 - 112 mEq/L   CO2 34 (H) 19 - 32 mEq/L   Glucose, Bld 87 70 - 99 mg/dL   BUN 21 6 - 23 mg/dL   Creatinine, Ser 1.39 (H) 0.40 - 1.20 mg/dL   Total Bilirubin 0.8 0.2 - 1.2 mg/dL   Alkaline Phosphatase 128 (H) 39 - 117 U/L   AST 30 0 - 37 U/L   ALT 10  0 - 35 U/L   Total Protein 6.6 6.0 - 8.3 g/dL   Albumin 4.0 3.5 - 5.2 g/dL   Calcium 9.7 8.4 - 10.5 mg/dL   GFR 38.58 (L) >60.00 mL/min  LDL cholesterol, direct     Status: None   Collection Time: 01/02/15 11:03 AM  Result Value Ref Range   Direct LDL 99.0 mg/dL    Comment: Optimal:  <100 mg/dLNear or Above Optimal:  100-129 mg/dLBorderline High:  130-159 mg/dLHigh:  160-189 mg/dLVery High:  >190 mg/dL  Basic metabolic panel     Status: Abnormal   Collection Time: 01/15/15  4:29 PM  Result Value Ref Range   Sodium 141 135 - 145 mmol/L   Potassium 4.5 3.5 - 5.1 mmol/L   Chloride 101 101 - 111 mmol/L   CO2 32 22 - 32 mmol/L   Glucose, Bld 119 (H) 65 - 99 mg/dL   BUN 22 (H) 6 - 20 mg/dL   Creatinine, Ser 1.47 (H) 0.44 - 1.00 mg/dL   Calcium 8.7 (L) 8.9 - 10.3 mg/dL   GFR calc non Af Amer 32 (L) >60 mL/min   GFR calc Af Amer 37 (L) >60 mL/min    Comment: (NOTE) The eGFR has been calculated using the CKD EPI equation. This calculation has not been validated in all clinical situations. eGFR's persistently <60 mL/min signify possible Chronic Kidney Disease.    Anion gap 8 5 - 15  CBC     Status: Abnormal   Collection Time: 01/15/15  4:29 PM  Result Value Ref Range   WBC 9.2 3.6 - 11.0 K/uL   RBC 3.87 3.80 - 5.20 MIL/uL   Hemoglobin 11.4 (L) 12.0 - 16.0 g/dL   HCT 34.7 (L) 35.0 - 47.0 %   MCV 89.6 80.0 - 100.0 fL   MCH 29.4 26.0 - 34.0 pg   MCHC 32.9 32.0 - 36.0 g/dL   RDW 15.7 (H) 11.5 - 14.5 %   Platelets 207 150 - 440 K/uL  Troponin I     Status: None   Collection Time: 01/15/15  7:52  PM  Result Value Ref Range   Troponin I <0.03 <0.031 ng/mL    Comment:        NO INDICATION OF MYOCARDIAL INJURY.     Dg Chest 2 View  01/15/2015  CLINICAL DATA:  Shortness of breath and chest pain EXAM: CHEST - 2 VIEW COMPARISON:  06/14/2014 FINDINGS: Cardiac shadow is stable. The lungs are well aerated bilaterally. No focal infiltrate or sizable effusion is noted. Mild scarring is noted in the right lung base stable from the previous exam. Mid thoracic compression deformities are again noted and stable. IMPRESSION: No acute abnormality noted. Electronically Signed   By: Inez Catalina M.D.   On: 01/15/2015 16:36   Current Outpatient Prescriptions on File Prior to Visit  Medication Sig Dispense Refill  . ADVAIR DISKUS 250-50 MCG/DOSE AEPB INHALE 1 PUFF BY MOUTH TWICE A DAY 60 each 0  . albuterol (PROVENTIL HFA;VENTOLIN HFA) 108 (90 BASE) MCG/ACT inhaler Inhale 2 puffs into the lungs every 6 (six) hours as needed for wheezing. 1 Inhaler 0  . amLODipine (NORVASC) 10 MG tablet Take 10 mg by mouth daily.    Marland Kitchen atorvastatin (LIPITOR) 40 MG tablet TAKE 1 TABLET BY MOUTH EVERY DAY 30 tablet 3  . clopidogrel (PLAVIX) 75 MG tablet TAKE 1 TABLET BY MOUTH EVERY DAY 30 tablet 5  . furosemide (LASIX) 40 MG tablet Take 40 mg by mouth daily.    Marland Kitchen  gabapentin (NEURONTIN) 800 MG tablet TAKE 1 TABLET BY MOUTH TWICE A DAY 60 tablet 6  . levothyroxine (SYNTHROID, LEVOTHROID) 25 MCG tablet TAKE 1 TABLET BY MOUTH EVERY DAY 30 tablet 9  . lisinopril (PRINIVIL,ZESTRIL) 10 MG tablet TAKE 2 TABLETS (20 MG TOTAL) BY MOUTH DAILY. 60 tablet 5  . nitroGLYCERIN (NITROSTAT) 0.4 MG SL tablet Place 0.4 mg under the tongue every 5 (five) minutes as needed.      . ranitidine (ZANTAC) 300 MG tablet TAKE 1 TABLET (300 MG TOTAL) BY MOUTH AT BEDTIME. 90 tablet 1  . sertraline (ZOLOFT) 100 MG tablet TAKE 1 TABLET BY MOUTH EVERY DAY 30 tablet 5  . SPIRIVA HANDIHALER 18 MCG inhalation capsule PLACE 1 CAPSULE (18 MCG TOTAL) INTO INHALER  AND INHALE DAILY. 30 capsule 5  . terconazole (TERAZOL 3) 0.8 % vaginal cream Place 1 applicator vaginally at bedtime. 20 g 0  . traZODone (DESYREL) 50 MG tablet TAKE 1/2 TO 1 TABLET AT BEDTIME AS NEEDED FOR SLEEP 30 tablet 1  . vitamin B-12 (CYANOCOBALAMIN) 1000 MCG tablet Take 1,000 mcg by mouth daily.    . Vitamin D, Ergocalciferol, (DRISDOL) 50000 UNITS CAPS capsule TAKE 1 CAPSULE BY MOUTH EVERY 7 DAYS 6 capsule 3   No current facility-administered medications on file prior to visit.    Allergies  Allergen Reactions  . Codeine Other (See Comments)    Coma for 2 days  . Prednisone Other (See Comments)    Weight gain,puffy face  . Shellfish Allergy     Past Medical History  Diagnosis Date  . HTN (hypertension)   . COPD (chronic obstructive pulmonary disease) ([place])   . Depression   . Thyroid disease   . PMR (polymyalgia rheumatica) ([facility])     with h/o steroid induced myopathy  . Anxiety   . Diastolic dysfunction     Echo 05/11/2010, EF 65-70%  . TIA (transient ischemic attack)     11/05 s/p L CEA  . Kidney infection   . Carotid artery occlusion   . CAD (coronary artery disease)     h/o NSTEMI, LAD stent placement 10/04, cath 05/11/2010  . HLD (hyperlipidemia)   . Alzheimer disease     Past Surgical History  Procedure Laterality Date  . Cholecystectomy    . Carotid endarterectomy      left with stent placement 11/2003  . Iliac artery stent      right, 2001  . Cardiac catheterization      unc  . Cardiac catheterization    . Cardiac catheterization    . Lung biopsy      No family history on file.  Social History   Social History  . Marital Status: Married    Spouse Name: N/A  . Number of Children: N/A  . Years of Education: N/A   Occupational History  . retired     Pharmacist, hospital   Social History Main Topics  . Smoking status: Former Smoker -- 1.00 packs/day for 25 years    Types: Cigarettes    Quit date: 01/05/1996  . Smokeless tobacco: Never Used  . Alcohol  Use: No  . Drug Use: No  . Sexual Activity: Not on file   Other Topics Concern  . Not on file   Social History Narrative   Lives with husband, [name].   Daughters, [name] and [name] very involved.   The PMH, PSH, Social History, Family History, Medications, and allergies have been reviewed in [facility], and  have been updated if relevant.   Review of Systems  Constitutional: Negative.   HENT: Negative.   Eyes: Negative.   Respiratory: Negative.   Cardiovascular: Negative.   Gastrointestinal: Negative.   Endocrine: Negative.   Genitourinary: Negative.   Musculoskeletal: Negative.   Skin: Negative.   Allergic/Immunologic: Negative.   Neurological: Negative.   Hematological: Negative.   Psychiatric/Behavioral: Negative.   All other systems reviewed and are negative.      Objective:    BP 142/76 mmHg  Pulse 88  Temp(Src) 98.1 F (36.7 C) (Oral)  Wt 197 lb 12 oz (89.699 kg)  SpO2 92%   Physical Exam  Constitutional: She is oriented to person, place, and time. She appears well-developed and well-nourished. No distress.  HENT:  Head: Normocephalic.  Eyes: Conjunctivae are normal.  Cardiovascular: Normal rate.   Pulmonary/Chest: Effort normal.  Musculoskeletal: Normal range of motion.  Neurological: She is alert and oriented to person, place, and time. No cranial nerve deficit.  Skin: Skin is warm and dry. She is not diaphoretic.  Psychiatric: She has a normal mood and affect. Her behavior is normal. Judgment and thought content normal.  Nursing note and vitals reviewed.         Assessment & Plan:   Chest pain, unspecified chest pain type  CKD (chronic kidney disease) stage 3, GFR 30-59 ml/min No Follow-up on file.

## 2015-02-13 NOTE — Progress Notes (Signed)
Pre visit review using our clinic review tool, if applicable. No additional management support is needed unless otherwise documented below in the visit note. 

## 2015-02-13 NOTE — Assessment & Plan Note (Signed)
Acute on chronic renal failure. Recheck BMET today.

## 2015-02-13 NOTE — Assessment & Plan Note (Signed)
Resolved. Continue current rxs.

## 2015-02-14 ENCOUNTER — Encounter: Payer: Self-pay | Admitting: *Deleted

## 2015-03-10 ENCOUNTER — Emergency Department
Admission: EM | Admit: 2015-03-10 | Discharge: 2015-03-10 | Disposition: A | Discharge status: Home / Self Care | Payer: Medicare Other | Attending: Emergency Medicine | Admitting: Emergency Medicine

## 2015-03-10 ENCOUNTER — Emergency Department: Payer: Medicare Other

## 2015-03-10 DIAGNOSIS — W01198A Fall on same level from slipping, tripping and stumbling with subsequent striking against other object, initial encounter: Secondary | ICD-10-CM | POA: Insufficient documentation

## 2015-03-10 DIAGNOSIS — Z79899 Other long term (current) drug therapy: Secondary | ICD-10-CM | POA: Insufficient documentation

## 2015-03-10 DIAGNOSIS — Z792 Long term (current) use of antibiotics: Secondary | ICD-10-CM | POA: Diagnosis not present

## 2015-03-10 DIAGNOSIS — Y92009 Unspecified place in unspecified non-institutional (private) residence as the place of occurrence of the external cause: Secondary | ICD-10-CM | POA: Diagnosis not present

## 2015-03-10 DIAGNOSIS — F0280 Dementia in other diseases classified elsewhere without behavioral disturbance: Secondary | ICD-10-CM | POA: Insufficient documentation

## 2015-03-10 DIAGNOSIS — Y9389 Activity, other specified: Secondary | ICD-10-CM | POA: Insufficient documentation

## 2015-03-10 DIAGNOSIS — S0990XA Unspecified injury of head, initial encounter: Secondary | ICD-10-CM | POA: Insufficient documentation

## 2015-03-10 DIAGNOSIS — Y998 Other external cause status: Secondary | ICD-10-CM | POA: Insufficient documentation

## 2015-03-10 DIAGNOSIS — Z87891 Personal history of nicotine dependence: Secondary | ICD-10-CM | POA: Diagnosis not present

## 2015-03-10 DIAGNOSIS — G309 Alzheimer's disease, unspecified: Secondary | ICD-10-CM | POA: Insufficient documentation

## 2015-03-10 DIAGNOSIS — I129 Hypertensive chronic kidney disease with stage 1 through stage 4 chronic kidney disease, or unspecified chronic kidney disease: Secondary | ICD-10-CM | POA: Insufficient documentation

## 2015-03-10 DIAGNOSIS — N183 Chronic kidney disease, stage 3 (moderate): Secondary | ICD-10-CM | POA: Insufficient documentation

## 2015-03-10 DIAGNOSIS — Z7902 Long term (current) use of antithrombotics/antiplatelets: Secondary | ICD-10-CM | POA: Diagnosis not present

## 2015-03-10 LAB — CBC WITH DIFFERENTIAL/PLATELET
BASOS ABS: 0 10*3/uL (ref 0–0.1)
BASOS PCT: 1 %
EOS ABS: 0.1 10*3/uL (ref 0–0.7)
Eosinophils Relative: 2 %
HCT: 34.3 % — ABNORMAL LOW (ref 35.0–47.0)
HEMOGLOBIN: 11.6 g/dL — AB (ref 12.0–16.0)
Lymphocytes Relative: 13 %
Lymphs Abs: 0.8 10*3/uL — ABNORMAL LOW (ref 1.0–3.6)
MCH: 29.8 pg (ref 26.0–34.0)
MCHC: 33.9 g/dL (ref 32.0–36.0)
MCV: 87.9 fL (ref 80.0–100.0)
Monocytes Absolute: 0.6 10*3/uL (ref 0.2–0.9)
Monocytes Relative: 9 %
NEUTROS ABS: 4.9 10*3/uL (ref 1.4–6.5)
NEUTROS PCT: 75 %
Platelets: 150 10*3/uL (ref 150–440)
RBC: 3.9 MIL/uL (ref 3.80–5.20)
RDW: 15.6 % — ABNORMAL HIGH (ref 11.5–14.5)
WBC: 6.4 10*3/uL (ref 3.6–11.0)

## 2015-03-10 LAB — BASIC METABOLIC PANEL
ANION GAP: 9 (ref 5–15)
BUN: 19 mg/dL (ref 6–20)
CO2: 36 mmol/L — ABNORMAL HIGH (ref 22–32)
CREATININE: 1.07 mg/dL — AB (ref 0.44–1.00)
Calcium: 9.2 mg/dL (ref 8.9–10.3)
Chloride: 100 mmol/L — ABNORMAL LOW (ref 101–111)
GFR, EST AFRICAN AMERICAN: 54 mL/min — AB (ref >60)
GFR, EST NON AFRICAN AMERICAN: 47 mL/min — AB (ref >60)
Glucose, Bld: 98 mg/dL (ref 65–99)
Potassium: 3.8 mmol/L (ref 3.5–5.1)
SODIUM: 145 mmol/L (ref 135–145)

## 2015-03-10 LAB — TROPONIN I: Troponin I: <0.03 ng/mL (ref <0.031)

## 2015-03-10 LAB — URINALYSIS COMPLETE WITH MICROSCOPIC (ARMC ONLY)
Bilirubin Urine: NEGATIVE
Glucose, UA: NEGATIVE mg/dL
Hgb urine dipstick: NEGATIVE
Ketones, ur: NEGATIVE mg/dL
LEUKOCYTES UA: NEGATIVE
Nitrite: POSITIVE — AB
PH: 7 (ref 5.0–8.0)
PROTEIN: NEGATIVE mg/dL
RBC / HPF: NONE SEEN RBC/hpf (ref 0–5)
Specific Gravity, Urine: 1.008 (ref 1.005–1.030)

## 2015-03-10 NOTE — ED Notes (Signed)
Blood draw attempted x 2 without success  

## 2015-03-10 NOTE — Discharge Instructions (Signed)
There is some possibility you  may have a urinary tract infection. Antibiotics however not good for you unless absolutely needed. We will send a culture, if you have pain when you urinate fever or weakness or you feel worse in any way including vomiting return to the emergency department.

## 2015-03-10 NOTE — ED Notes (Signed)
Pt reports falling and had unknown LOC. Pt reports hitting her head. Pt lives at [facility]. Pt reports headache.

## 2015-03-10 NOTE — ED Provider Notes (Addendum)
[facility] Emergency Department Provider Note  ____________________________________________   I have reviewed the triage vital signs and the nursing notes.   HISTORY  Chief Complaint Fall    HPI Joy [name] is a 82 y.o. female who is not on blood thinners aside from Plavix and aspirin. Patient states she had a non-syncopal fall today. She was leaning over to pick up a soap dish, and she states "I just kept going". She was not unconscious and contrary to triage note. She murmurs hitting the ground. Her husband was in the house with her and came immediately to her side and she was awake and alert. This is a non-syncopal fall. Patient has been at her baseline but she does have a history of frequent falls and the family were wondering if the patient perhaps could have some blood work to ensure that there is no other pathology present. She has no chest pain or short of breath, she does have chronic lower extremity swelling and she had Lasix a few days ago and it seemed to help. Patient has had no chest pain or shortness of breath and she is mentally at her baseline according to family. She doesn't slight headache after hitting her head. Denies neck pain.  Past Medical History  Diagnosis Date  . HTN (hypertension)   . COPD (chronic obstructive pulmonary disease) ([place])   . Depression   . Thyroid disease   . PMR (polymyalgia rheumatica) (H[facility])     with h/o steroid induced myopathy  . Anxiety   . Diastolic dysfunction     Echo 05/11/2010, EF 65-70%  . TIA (transient ischemic attack)     11/05 s/p L CEA  . Kidney infection   . Carotid artery occlusion   . CAD (coronary artery disease)     h/o NSTEMI, LAD stent placement 10/04, cath 05/11/2010  . HLD (hyperlipidemia)   . Alzheimer disease     Patient Active Problem List   Diagnosis Date Noted  . Chest pain 02/13/2015  . Allergic rhinitis 11/04/2014  . Chronic diastolic heart failure ([place]) 07/22/2014  . CKD  (chronic kidney disease) stage 3, GFR 30-59 ml/min 06/11/2014  . Carotid artery disease ([place]) 04/08/2014  . Nevus of cheek 10/30/2013  . CAD (coronary artery disease)   . HLD (hyperlipidemia)   . Lung nodule 01/15/2013  . Memory loss 01/15/2013  . Weakness generalized 12/26/2012  . Incontinence 12/26/2012  . GERD (gastroesophageal reflux disease) 09/06/2012  . Neuropathy ([place]) 01/14/2011  . Insomnia 01/14/2011  . 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 HTN (hypertension)   . COPD (chronic obstructive pulmonary disease) ([place])   . Depression   . Thyroid disease     Past Surgical History  Procedure Laterality Date  . Cholecystectomy    . Carotid endarterectomy      left with stent placement 11/2003  . Iliac artery stent      right, 2001  . Cardiac catheterization      unc  . Cardiac catheterization    . Cardiac catheterization    . Lung biopsy      Current Outpatient Rx  Name  Route  Sig  Dispense  Refill  . ADVAIR DISKUS 250-50 MCG/DOSE AEPB      INHALE 1 PUFF BY MOUTH TWICE A DAY   60 each   0   . albuterol (PROVENTIL HFA;VENTOLIN HFA) 108 (90 BASE) MCG/ACT inhaler   Inhalation   Inhale 2 puffs into the  lungs every 6 (six) hours as needed for wheezing.   1 Inhaler   0   . amLODipine (NORVASC) 10 MG tablet   Oral   Take 10 mg by mouth daily.         Marland Kitchen atorvastatin (LIPITOR) 40 MG tablet      TAKE 1 TABLET BY MOUTH EVERY DAY   30 tablet   3   . clopidogrel (PLAVIX) 75 MG tablet      TAKE 1 TABLET BY MOUTH EVERY DAY   30 tablet   5   . furosemide (LASIX) 40 MG tablet   Oral   Take 40 mg by mouth daily.         Marland Kitchen gabapentin (NEURONTIN) 800 MG tablet      TAKE 1 TABLET BY MOUTH TWICE A DAY   60 tablet   6   . levothyroxine (SYNTHROID, LEVOTHROID) 25 MCG tablet      TAKE 1 TABLET BY MOUTH EVERY DAY   30 tablet   9   . lisinopril (PRINIVIL,ZESTRIL) 10 MG tablet      TAKE 2 TABLETS  (20 MG TOTAL) BY MOUTH DAILY.   60 tablet   5   . nitroGLYCERIN (NITROSTAT) 0.4 MG SL tablet   Sublingual   Place 0.4 mg under the tongue every 5 (five) minutes as needed.           . ranitidine (ZANTAC) 300 MG tablet      TAKE 1 TABLET (300 MG TOTAL) BY MOUTH AT BEDTIME.   90 tablet   1   . sertraline (ZOLOFT) 100 MG tablet      TAKE 1 TABLET BY MOUTH EVERY DAY   30 tablet   5   . SPIRIVA HANDIHALER 18 MCG inhalation capsule      PLACE 1 CAPSULE (18 MCG TOTAL) INTO INHALER AND INHALE DAILY.   30 capsule   5   . terconazole (TERAZOL 3) 0.8 % vaginal cream   Vaginal   Place 1 applicator vaginally at bedtime.   20 g   0   . traZODone (DESYREL) 50 MG tablet      TAKE 1/2 TO 1 TABLET AT BEDTIME AS NEEDED FOR SLEEP   30 tablet   1   . vitamin B-12 (CYANOCOBALAMIN) 1000 MCG tablet   Oral   Take 1,000 mcg by mouth daily.         . Vitamin D, Ergocalciferol, (DRISDOL) 50000 UNITS CAPS capsule      TAKE 1 CAPSULE BY MOUTH EVERY 7 DAYS   6 capsule   3     Allergies Codeine; Prednisone; and Shellfish allergy  No family history on file.  Social History Social History  Substance Use Topics  . Smoking status: Former Smoker -- 1.00 packs/day for 25 years    Types: Cigarettes    Quit date: 01/05/1996  . Smokeless tobacco: Never Used  . Alcohol Use: No    Review of Systems Constitutional: No fever/chills Eyes: No visual changes. ENT: No sore throat. No stiff neck no neck pain Cardiovascular: Denies chest pain. Respiratory: Denies shortness of breath. Gastrointestinal:   no vomiting.  No diarrhea.  No constipation. Genitourinary: Negative for dysuria. Musculoskeletal: Negative lower extremity swelling Skin: Negative for rash. Neurological: Negative for headaches, focal weakness or numbness. 10-point ROS otherwise negative.  ____________________________________________   PHYSICAL EXAM:  VITAL SIGNS: ED Triage Vitals  Enc Vitals Group     BP  03/10/15 1147 124/99 mmHg  Pulse Rate 03/10/15 1448 64     Resp 03/10/15 1147 18     Temp 03/10/15 1147 98.2 F (36.8 C)     Temp src --      SpO2 03/10/15 1147 91 %     Weight --      Height --      Head Cir --      Peak Flow --      Pain Score 03/10/15 1148 0     Pain Loc --      Pain Edu? --      Excl. in [place]? --     Constitutional: Alert and oriented mildly demented. Well appearing and in no acute distress. Eyes: Conjunctivae are normal. PERRL. EOMI. Head: Atraumatic. Nose: No congestion/rhinnorhea. Mouth/Throat: Mucous membranes are moist.  Oropharynx non-erythematous. Neck: No stridor.   Nontender with no meningismus Cardiovascular: Normal rate, regular rhythm. Grossly normal heart sounds.  Good peripheral circulation. Respiratory: Normal respiratory effort.  No retractions. Lungs CTAB. Abdominal: Soft and nontender. No distention. No guarding no rebound Back:  There is no focal tenderness or step off there is no midline tenderness there are no lesions noted. there is no CVA tenderness Musculoskeletal: No lower extremity tenderness. No joint effusions, no DVT signs strong distal pulses symmetric bilateral lower extremity pitting edema Neurologic:  Normal speech and language. No gross focal neurologic deficits are appreciated.  Skin:  Skin is warm, dry and intact. No rash noted. Psychiatric: Mood and affect are normal. Speech and behavior are normal.  ____________________________________________   LABS (all labs ordered are listed, but only abnormal results are displayed)  Labs Reviewed  URINALYSIS COMPLETEWITH MICROSCOPIC ([place])  TROPONIN I  BASIC METABOLIC PANEL  CBC WITH DIFFERENTIAL/PLATELET   ____________________________________________  EKG  I personally interpreted any EKGs ordered by me or triage Normal sinus rhythm at 64 bpm no acute ST elevation or acute ST depression normal axis unremarkable  EKG ____________________________________________  RADIOLOGY  I reviewed any imaging ordered by me or triage that were performed during my shift ____________________________________________   PROCEDURES  Procedure(s) performed: None  Critical Care performed: None  ____________________________________________   INITIAL IMPRESSION / ASSESSMENT AND PLAN / ED COURSE  Pertinent labs & imaging results that were available during my care of the patient were reviewed by me and considered in my medical decision making (see chart for details).  Patient with a non-syncopal fall with closed head injury CT of the head is negative reassuringly there is no evidence of midline tenderness to suggest broken neck or cervical fracture, patient has no complaints at this time family would like me to check some basic blood work which we will oblige them with and will reassess.  ----------------------------------------- 6:26 PM on 03/10/2015 -----------------------------------------  Slated nitrates and urine with no evidence of UTI. Family states she does not do well with antibiotics and would prefer to wait for antibiotics until culture proven UTI. Patient has no dysuria or urinary frequency. No white cells in her urine no leukocytes. I count is 6.4. Otherwise very unremarkable workup with no evidence of significant dehydration creatinine is less than multiple different prior sticks. No evidence of other acute pathology. Patient had a non-syncopal fall has no complaints with a negative CT scan we will discharge home she remains neurologically intact ____________________________________________   FINAL CLINICAL IMPRESSION(S) / ED DIAGNOSES  Final diagnoses:  None      This chart was dictated using voice recognition software.  Despite best efforts to proofread,  errors can occur which can change meaning.     Schuyler Amor, MD 03/10/15 1533  Schuyler Amor, MD 03/10/15 [place], MD 03/10/15 [phone]

## 2015-03-13 LAB — URINE CULTURE: Culture: >=100000

## 2015-03-14 NOTE — Progress Notes (Signed)
Pt with positive urine culture with >100,000 CFU pan sensitive E.coli.  Per MD Edd Fabian, will call in script for Augmentin 500mg  PO BID x10 days.   Spoke to daughter and patient, would like script for diflucan due to history of yeast infections 2/2 ABX use.  MD Edd Fabian ok'd script for fluconazole 150mg  PO x1.  Both orders called into [facility] on [address] per patient preference.  Results for orders placed or performed during the hospital encounter of 03/10/15  Urine culture     Status: None   Collection Time: 03/10/15  5:32 PM  Result Value Ref Range Status   Specimen Description URINE, RANDOM  Final   Special Requests NONE  Final   Culture >=100,000 COLONIES/mL ESCHERICHIA COLI  Final   Report Status 03/13/2015 FINAL  Final   Organism ID, Bacteria ESCHERICHIA COLI  Final      Susceptibility   Escherichia coli - MIC*    AMPICILLIN <=2 SENSITIVE Sensitive     CEFAZOLIN <=4 SENSITIVE Sensitive     CEFTRIAXONE <=1 SENSITIVE Sensitive     CIPROFLOXACIN <=0.25 SENSITIVE Sensitive     GENTAMICIN <=1 SENSITIVE Sensitive     IMIPENEM <=0.25 SENSITIVE Sensitive     NITROFURANTOIN <=16 SENSITIVE Sensitive     TRIMETH/SULFA <=20 SENSITIVE Sensitive     AMPICILLIN/SULBACTAM <=2 SENSITIVE Sensitive     PIP/TAZO <=4 SENSITIVE Sensitive     Extended ESBL NEGATIVE Sensitive     * >=100,000 COLONIES/mL ESCHERICHIA COLI   Roe Coombs, PharmD Pharmacy Resident 03/14/2015

## 2015-04-05 ENCOUNTER — Other Ambulatory Visit: Payer: Self-pay | Admitting: Family Medicine

## 2015-04-07 ENCOUNTER — Other Ambulatory Visit: Payer: Self-pay

## 2015-04-07 DIAGNOSIS — I6523 Occlusion and stenosis of bilateral carotid arteries: Secondary | ICD-10-CM

## 2015-04-13 ENCOUNTER — Other Ambulatory Visit: Payer: Self-pay | Admitting: Family Medicine

## 2015-04-15 ENCOUNTER — Telehealth: Payer: Self-pay | Admitting: Family Medicine

## 2015-04-15 ENCOUNTER — Ambulatory Visit (INDEPENDENT_AMBULATORY_CARE_PROVIDER_SITE_OTHER): Payer: Medicare Other | Admitting: Family Medicine

## 2015-04-15 ENCOUNTER — Encounter: Payer: Self-pay | Admitting: Family Medicine

## 2015-04-15 VITALS — BP 126/62 | HR 94 | Temp 98.4°F | Wt 195.5 lb

## 2015-04-15 DIAGNOSIS — J209 Acute bronchitis, unspecified: Secondary | ICD-10-CM

## 2015-04-15 DIAGNOSIS — I6523 Occlusion and stenosis of bilateral carotid arteries: Secondary | ICD-10-CM | POA: Diagnosis not present

## 2015-04-15 MED ORDER — ALBUTEROL SULFATE HFA 108 (90 BASE) MCG/ACT IN AERS: 2.0000 | INHALATION_SPRAY | Freq: Four times a day (QID) | RESPIRATORY_TRACT | Status: DC | PRN

## 2015-04-15 MED ORDER — FLUCONAZOLE 150 MG PO TABS: 150.0000 mg | ORAL_TABLET | Freq: Once | ORAL | Status: AC

## 2015-04-15 MED ORDER — AZITHROMYCIN 250 MG PO TABS: ORAL_TABLET | ORAL | Status: AC

## 2015-04-15 NOTE — Progress Notes (Signed)
SUBJECTIVE:  Joy [name] is a 82 y.o. female who complains of coryza, congestion, dry cough and chills for 7 days. She denies a history of anorexia and chest pain and admits to a history of asthma. Patient denies smoke cigarettes.   Has increased her O2 to 2.5 L/min Current Outpatient Prescriptions on File Prior to Visit  Medication Sig Dispense Refill  . ADVAIR DISKUS 250-50 MCG/DOSE AEPB INHALE 1 PUFF BY MOUTH TWICE A DAY 60 each 0  . albuterol (PROVENTIL HFA;VENTOLIN HFA) 108 (90 BASE) MCG/ACT inhaler Inhale 2 puffs into the lungs every 6 (six) hours as needed for wheezing. 1 Inhaler 0  . amLODipine (NORVASC) 10 MG tablet Take 10 mg by mouth daily.    Marland Kitchen atorvastatin (LIPITOR) 40 MG tablet TAKE 1 TABLET BY MOUTH EVERY DAY 30 tablet 3  . clopidogrel (PLAVIX) 75 MG tablet TAKE 1 TABLET BY MOUTH EVERY DAY 30 tablet 5  . furosemide (LASIX) 40 MG tablet Take 40 mg by mouth daily.    Marland Kitchen gabapentin (NEURONTIN) 800 MG tablet TAKE 1 TABLET BY MOUTH TWICE A DAY 60 tablet 6  . levothyroxine (SYNTHROID, LEVOTHROID) 25 MCG tablet TAKE 1 TABLET BY MOUTH EVERY DAY 30 tablet 9  . lisinopril (PRINIVIL,ZESTRIL) 10 MG tablet TAKE 2 TABLETS (20 MG TOTAL) BY MOUTH DAILY. 60 tablet 5  . nitroGLYCERIN (NITROSTAT) 0.4 MG SL tablet Place 0.4 mg under the tongue every 5 (five) minutes as needed.      . ranitidine (ZANTAC) 300 MG tablet TAKE 1 TABLET (300 MG TOTAL) BY MOUTH AT BEDTIME. 90 tablet 1  . sertraline (ZOLOFT) 100 MG tablet TAKE 1 TABLET BY MOUTH EVERY DAY 30 tablet 5  . SPIRIVA HANDIHALER 18 MCG inhalation capsule PLACE 1 CAPSULE (18 MCG TOTAL) INTO INHALER AND INHALE DAILY. 30 capsule 5  . terconazole (TERAZOL 3) 0.8 % vaginal cream Place 1 applicator vaginally at bedtime. 20 g 0  . traZODone (DESYREL) 50 MG tablet TAKE 1/2 TO 1 TABLET AT BEDTIME AS NEEDED FOR SLEEP 30 tablet 0  . vitamin B-12 (CYANOCOBALAMIN) 1000 MCG tablet Take 1,000 mcg by mouth daily.    . Vitamin D, Ergocalciferol, (DRISDOL) 50000  units CAPS capsule TAKE 1 CAPSULE BY MOUTH EVERY 7 DAYS 6 capsule 3   No current facility-administered medications on file prior to visit.    Allergies  Allergen Reactions  . Codeine Other (See Comments)    Coma for 2 days  . Prednisone Other (See Comments)    Weight gain,puffy face  . Shellfish Allergy     Past Medical History  Diagnosis Date  . HTN (hypertension)   . COPD (chronic obstructive pulmonary disease) ([place])   . Depression   . Thyroid disease   . PMR (polymyalgia rheumatica) ([facility])     with h/o steroid induced myopathy  . Anxiety   . Diastolic dysfunction     Echo 05/11/2010, EF 65-70%  . TIA (transient ischemic attack)     11/05 s/p L CEA  . Kidney infection   . Carotid artery occlusion   . CAD (coronary artery disease)     h/o NSTEMI, LAD stent placement 10/04, cath 05/11/2010  . HLD (hyperlipidemia)   . Alzheimer disease     Past Surgical History  Procedure Laterality Date  . Cholecystectomy    . Carotid endarterectomy      left with stent placement 11/2003  . Iliac artery stent      right, 2001  . Cardiac catheterization  unc  . Cardiac catheterization    . Cardiac catheterization    . Lung biopsy      No family history on file.  Social History   Social History  . Marital Status: Married    Spouse Name: N/A  . Number of Children: N/A  . Years of Education: N/A   Occupational History  . retired     Pharmacist, hospital   Social History Main Topics  . Smoking status: Former Smoker -- 1.00 packs/day for 25 years    Types: Cigarettes    Quit date: 01/05/1996  . Smokeless tobacco: Never Used  . Alcohol Use: No  . Drug Use: No  . Sexual Activity: Not on file   Other Topics Concern  . Not on file   Social History Narrative   Lives with husband, [name] at [place].   Daughters, [name] and [name] very involved.   The PMH, PSH, Social History, Family History, Medications, and allergies have been reviewed in [facility], and have been updated if  relevant.  OBJECTIVE: BP 126/62 mmHg  Pulse 94  Temp(Src) 98.4 F (36.9 C) (Oral)  Wt 195 lb 8 oz (88.678 kg)  SpO2 91%  She appears well, vital signs are as noted. Os per [place] Ears normal.  Throat and pharynx normal.  Neck supple. No adenopathy in the neck. Nose is congested. Sinuses non tender.Bibasilar wheezes  ASSESSMENT:  bronchitis  PLAN: zpack as directed, continue inhalers. Symptomatic therapy suggested: push fluids, rest and return office visit prn if symptoms persist or worsen.Call or return to clinic prn if these symptoms worsen or fail to improve as anticipated.

## 2015-04-15 NOTE — Telephone Encounter (Signed)
Joy [name] wanted to know if you would also call in something for a yeast infection She gets these when take a zpak [facility]

## 2015-04-15 NOTE — Progress Notes (Signed)
Pre visit review using our clinic review tool, if applicable. No additional management support is needed unless otherwise documented below in the visit note. 

## 2015-04-15 NOTE — Telephone Encounter (Signed)
Yes eRx sent for diflucan.

## 2015-04-17 ENCOUNTER — Emergency Department: Payer: Medicare Other

## 2015-04-17 ENCOUNTER — Emergency Department
Admission: EM | Admit: 2015-04-17 | Discharge: 2015-04-17 | Disposition: A | Discharge status: Home / Self Care | Payer: Medicare Other | Attending: Student | Admitting: Student

## 2015-04-17 ENCOUNTER — Encounter: Payer: Self-pay | Admitting: *Deleted

## 2015-04-17 ENCOUNTER — Telehealth: Payer: Self-pay | Admitting: Family Medicine

## 2015-04-17 DIAGNOSIS — R531 Weakness: Secondary | ICD-10-CM | POA: Diagnosis not present

## 2015-04-17 DIAGNOSIS — Z7902 Long term (current) use of antithrombotics/antiplatelets: Secondary | ICD-10-CM | POA: Insufficient documentation

## 2015-04-17 DIAGNOSIS — F329 Major depressive disorder, single episode, unspecified: Secondary | ICD-10-CM | POA: Diagnosis not present

## 2015-04-17 DIAGNOSIS — G309 Alzheimer's disease, unspecified: Secondary | ICD-10-CM | POA: Insufficient documentation

## 2015-04-17 DIAGNOSIS — J40 Bronchitis, not specified as acute or chronic: Secondary | ICD-10-CM | POA: Diagnosis not present

## 2015-04-17 DIAGNOSIS — I2510 Atherosclerotic heart disease of native coronary artery without angina pectoris: Secondary | ICD-10-CM | POA: Insufficient documentation

## 2015-04-17 DIAGNOSIS — Z79899 Other long term (current) drug therapy: Secondary | ICD-10-CM | POA: Insufficient documentation

## 2015-04-17 DIAGNOSIS — S20229A Contusion of unspecified back wall of thorax, initial encounter: Secondary | ICD-10-CM | POA: Diagnosis not present

## 2015-04-17 DIAGNOSIS — Z7951 Long term (current) use of inhaled steroids: Secondary | ICD-10-CM | POA: Insufficient documentation

## 2015-04-17 DIAGNOSIS — I130 Hypertensive heart and chronic kidney disease with heart failure and stage 1 through stage 4 chronic kidney disease, or unspecified chronic kidney disease: Secondary | ICD-10-CM | POA: Diagnosis not present

## 2015-04-17 DIAGNOSIS — I5032 Chronic diastolic (congestive) heart failure: Secondary | ICD-10-CM | POA: Insufficient documentation

## 2015-04-17 DIAGNOSIS — N183 Chronic kidney disease, stage 3 (moderate): Secondary | ICD-10-CM | POA: Diagnosis not present

## 2015-04-17 DIAGNOSIS — J449 Chronic obstructive pulmonary disease, unspecified: Secondary | ICD-10-CM | POA: Insufficient documentation

## 2015-04-17 DIAGNOSIS — E785 Hyperlipidemia, unspecified: Secondary | ICD-10-CM | POA: Insufficient documentation

## 2015-04-17 DIAGNOSIS — Z87891 Personal history of nicotine dependence: Secondary | ICD-10-CM | POA: Diagnosis not present

## 2015-04-17 DIAGNOSIS — J209 Acute bronchitis, unspecified: Secondary | ICD-10-CM | POA: Insufficient documentation

## 2015-04-17 LAB — URINALYSIS COMPLETE WITH MICROSCOPIC (ARMC ONLY)
Bilirubin Urine: NEGATIVE
Glucose, UA: NEGATIVE mg/dL
Hgb urine dipstick: NEGATIVE
KETONES UR: NEGATIVE mg/dL
LEUKOCYTES UA: NEGATIVE
NITRITE: NEGATIVE
PH: 5 (ref 5.0–8.0)
PROTEIN: NEGATIVE mg/dL
SPECIFIC GRAVITY, URINE: 1.012 (ref 1.005–1.030)

## 2015-04-17 LAB — COMPREHENSIVE METABOLIC PANEL
ALBUMIN: 3.7 g/dL (ref 3.5–5.0)
ALT: 9 U/L — ABNORMAL LOW (ref 14–54)
ANION GAP: 7 (ref 5–15)
AST: 22 U/L (ref 15–41)
Alkaline Phosphatase: 114 U/L (ref 38–126)
BUN: 21 mg/dL — ABNORMAL HIGH (ref 6–20)
CHLORIDE: 105 mmol/L (ref 101–111)
CO2: 31 mmol/L (ref 22–32)
Calcium: 8.7 mg/dL — ABNORMAL LOW (ref 8.9–10.3)
Creatinine, Ser: 1.09 mg/dL — ABNORMAL HIGH (ref 0.44–1.00)
GFR calc Af Amer: 53 mL/min — ABNORMAL LOW (ref >60)
GFR calc non Af Amer: 46 mL/min — ABNORMAL LOW (ref >60)
GLUCOSE: 107 mg/dL — AB (ref 65–99)
POTASSIUM: 3.6 mmol/L (ref 3.5–5.1)
SODIUM: 143 mmol/L (ref 135–145)
Total Bilirubin: 0.9 mg/dL (ref 0.3–1.2)
Total Protein: 6.4 g/dL — ABNORMAL LOW (ref 6.5–8.1)

## 2015-04-17 LAB — CBC WITH DIFFERENTIAL/PLATELET
BASOS PCT: 0 %
Basophils Absolute: 0 10*3/uL (ref 0–0.1)
EOS ABS: 0 10*3/uL (ref 0–0.7)
Eosinophils Relative: 0 %
HCT: 32.8 % — ABNORMAL LOW (ref 35.0–47.0)
HEMOGLOBIN: 11.1 g/dL — AB (ref 12.0–16.0)
Lymphocytes Relative: 8 %
Lymphs Abs: 0.8 10*3/uL — ABNORMAL LOW (ref 1.0–3.6)
MCH: 30.4 pg (ref 26.0–34.0)
MCHC: 33.9 g/dL (ref 32.0–36.0)
MCV: 89.6 fL (ref 80.0–100.0)
MONO ABS: 1.1 10*3/uL — AB (ref 0.2–0.9)
MONOS PCT: 11 %
NEUTROS PCT: 81 %
Neutro Abs: 8.2 10*3/uL — ABNORMAL HIGH (ref 1.4–6.5)
PLATELETS: 140 10*3/uL — AB (ref 150–440)
RBC: 3.66 MIL/uL — ABNORMAL LOW (ref 3.80–5.20)
RDW: 15.6 % — AB (ref 11.5–14.5)
WBC: 10.1 10*3/uL (ref 3.6–11.0)

## 2015-04-17 LAB — TROPONIN I: Troponin I: <0.03 ng/mL (ref <0.031)

## 2015-04-17 MED ORDER — SODIUM CHLORIDE 0.9 % IV BOLUS (SEPSIS)
500.0000 mL | Freq: Once | INTRAVENOUS | Status: AC
  Administered 2015-04-17: 500 mL via INTRAVENOUS

## 2015-04-17 MED ORDER — ALBUTEROL SULFATE HFA 108 (90 BASE) MCG/ACT IN AERS: 2.0000 | INHALATION_SPRAY | Freq: Four times a day (QID) | RESPIRATORY_TRACT | Status: DC | PRN

## 2015-04-17 MED ORDER — ACETAMINOPHEN 325 MG PO TABS
650.0000 mg | ORAL_TABLET | Freq: Once | ORAL | Status: AC
  Administered 2015-04-17: 650 mg via ORAL
  Filled 2015-04-17: qty 2

## 2015-04-17 MED ORDER — IPRATROPIUM-ALBUTEROL 0.5-2.5 (3) MG/3ML IN SOLN
3.0000 mL | Freq: Once | RESPIRATORY_TRACT | Status: AC
  Administered 2015-04-17: 3 mL via RESPIRATORY_TRACT

## 2015-04-17 MED ORDER — IPRATROPIUM-ALBUTEROL 0.5-2.5 (3) MG/3ML IN SOLN
RESPIRATORY_TRACT | Status: AC
  Administered 2015-04-17: 3 mL via RESPIRATORY_TRACT
  Filled 2015-04-17: qty 3

## 2015-04-17 NOTE — ED Notes (Signed)
Per EMS report, Patient was diagnosed with Bronchitis on 4/10 and has felt weak since. Family reports patient fell last night and has bruise on her back. Patient is normally on 2L O2 via [place] at home, but had to be increased to 3L in the ambulance to maintain sats.

## 2015-04-17 NOTE — Telephone Encounter (Signed)
[place]  Patient Name: Joy [name]  DOB: Aug 16, 1930    Initial Comment Caller states mother has bronchitis, c/o cough, rattling in chest, back pain from fall this morning, requests nebulizer and medication   Nurse Assessment  Nurse: Genoveva Ill, RN, Lattie Haw Date/Time (Eastern Time): 04/17/2015 2:43:14 PM  Confirm and document reason for call. If symptomatic, describe symptoms. You must click the next button to save text entered. ---Caller states mother has bronchitis, c/o cough, rattling in chest, non-productive cough, back pain from fall this morning; states she thinks she needs to take mother to ER; hit upper back on cabinet this am when fell and has bruise on back that is painful to touch on spine with swelling  Has the patient traveled out of the country within the last 30 days? ---Not Applicable  Does the patient have any new or worsening symptoms? ---Yes  Will a triage be completed? ---Yes  Related visit to physician within the last 2 weeks? ---Yes  Does the PT have any chronic conditions? (i.e. diabetes, asthma, etc.) ---Yes  List chronic conditions. ---COPD. emphysema, high cholesterol, congenital heart failure, PMR, hx stroke, alzheimer's  Is this a behavioral health or substance abuse call? ---No     Guidelines    Guideline Title Affirmed Question Affirmed Notes  Cough - Acute Non-Productive Difficulty breathing    Final Disposition User   Go to ED Now [place], RN, [facility] - ED   Disagree/Comply: Comply

## 2015-04-17 NOTE — ED Notes (Signed)
Family is bringing the patient's O2 tank and a pull-up prior to discharge.

## 2015-04-17 NOTE — ED Provider Notes (Addendum)
[facility] Emergency Department Provider Note  ____________________________________________  Time seen: Approximately 4:40 PM  I have reviewed the triage vital signs and the nursing notes.   HISTORY  Chief Complaint Fall    HPI Joy [name] is a 82 y.o. female with history of hypertension, COPD with chronic 3 L home oxygen requirement, coronary artery disease, hyperlipidemia, Alzheimer's dementia who presents for evaluation of generalized weakness and worsening cough  over the past 4 days, gradual onset, constant since onset, currently moderate, no modifying factors. Patient was seen by primary care doctor on 04/15/2015 and diagnosed with bronchitis, started on a Z-Pak. She reports that her cough has continued. Last night  she was walking to the bathroom, she reports that her "legs buckled" she fell and landed on her back. She did not hit her head or lose consciousness. Denies neck pain. She denies any chest pain, vomiting, diarrhea, fevers or chills. She has had shortness of breath.   Past Medical History  Diagnosis Date  . HTN (hypertension)   . COPD (chronic obstructive pulmonary disease) ([place])   . Depression   . Thyroid disease   . PMR (polymyalgia rheumatica) (HCC)     with h/o steroid induced myopathy  . Anxiety   . Diastolic dysfunction     Echo 05/11/2010, EF 65-70%  . TIA (transient ischemic attack)     11/05 s/p L CEA  . Kidney infection   . Carotid artery occlusion   . CAD (coronary artery disease)     h/o NSTEMI, LAD stent placement 10/04, cath 05/11/2010  . HLD (hyperlipidemia)   . Alzheimer disease     Patient Active Problem List   Diagnosis Date Noted  . Chest pain 02/13/2015  . Allergic rhinitis 11/04/2014  . Chronic diastolic heart failure ([place]) 07/22/2014  . CKD (chronic kidney disease) stage 3, GFR 30-59 ml/min 06/11/2014  . Carotid artery disease ([place]) 04/08/2014  . Nevus of cheek 10/30/2013  . CAD (coronary artery disease)    . HLD (hyperlipidemia)   . Lung nodule 01/15/2013  . Memory loss 01/15/2013  . Weakness generalized 12/26/2012  . Incontinence 12/26/2012  . GERD (gastroesophageal reflux disease) 09/06/2012  . Neuropathy ([place]) 01/14/2011  . Insomnia 01/14/2011  . MELAS (mitochondrial encephalopathy, lactic acidosis and stroke-like episodes) ([place]) 11/02/2010  . HTN (hypertension)   . COPD (chronic obstructive pulmonary disease) ([place])   . Depression   . Thyroid disease     Past Surgical History  Procedure Laterality Date  . Cholecystectomy    . Carotid endarterectomy      left with stent placement 11/2003  . Iliac artery stent      right, 2001  . Cardiac catheterization      unc  . Cardiac catheterization    . Cardiac catheterization    . Lung biopsy      Current Outpatient Rx  Name  Route  Sig  Dispense  Refill  . atorvastatin (LIPITOR) 40 MG tablet   Oral   Take 40 mg by mouth daily.         . clopidogrel (PLAVIX) 75 MG tablet   Oral   Take 75 mg by mouth daily.         . Fluticasone-Salmeterol (ADVAIR) 250-50 MCG/DOSE AEPB   Inhalation   Inhale 1 puff into the lungs 2 (two) times daily.         Marland Kitchen gabapentin (NEURONTIN) 800 MG tablet   Oral   Take 800 mg by mouth 2 (two)  times daily.         Marland Kitchen levothyroxine (SYNTHROID, LEVOTHROID) 25 MCG tablet   Oral   Take 25 mcg by mouth daily before breakfast.         . lisinopril (PRINIVIL,ZESTRIL) 20 MG tablet   Oral   Take 20 mg by mouth daily.         . ranitidine (ZANTAC) 300 MG tablet   Oral   Take 300 mg by mouth at bedtime.         . sertraline (ZOLOFT) 100 MG tablet   Oral   Take 100 mg by mouth daily.         Marland Kitchen tiotropium (SPIRIVA) 18 MCG inhalation capsule   Inhalation   Place 18 mcg into inhaler and inhale daily.         . traZODone (DESYREL) 50 MG tablet   Oral   Take 25-50 mg by mouth at bedtime as needed for sleep.         . Vitamin D, Ergocalciferol, (DRISDOL) 50000 units CAPS capsule    Oral   Take 50,000 Units by mouth every 7 (seven) days.         Marland Kitchen albuterol (PROVENTIL HFA;VENTOLIN HFA) 108 (90 Base) MCG/ACT inhaler   Inhalation   Inhale 2 puffs into the lungs every 6 (six) hours as needed for wheezing.   1 Inhaler   0   . amLODipine (NORVASC) 10 MG tablet   Oral   Take 10 mg by mouth daily.         Marland Kitchen azithromycin (ZITHROMAX Z-PAK) 250 MG tablet      Take 2 tablets (500 mg) on  Day 1,  followed by 1 tablet (250 mg) once daily on Days 2 through 5.   6 each   0   . furosemide (LASIX) 40 MG tablet   Oral   Take 40 mg by mouth daily.         . nitroGLYCERIN (NITROSTAT) 0.4 MG SL tablet   Sublingual   Place 0.4 mg under the tongue every 5 (five) minutes as needed.           Marland Kitchen terconazole (TERAZOL 3) 0.8 % vaginal cream   Vaginal   Place 1 applicator vaginally at bedtime.   20 g   0   . vitamin B-12 (CYANOCOBALAMIN) 1000 MCG tablet   Oral   Take 1,000 mcg by mouth daily.           Allergies Codeine; Prednisone; and Shellfish allergy  No family history on file.  Social History Social History  Substance Use Topics  . Smoking status: Former Smoker -- 1.00 packs/day for 25 years    Types: Cigarettes    Quit date: 01/05/1996  . Smokeless tobacco: Never Used  . Alcohol Use: No    Review of Systems Constitutional: No fever/chills Eyes: No visual changes. ENT: No sore throat. Cardiovascular: Denies chest pain. Respiratory: + shortness of breath. Gastrointestinal: No abdominal pain.  No nausea, no vomiting.  No diarrhea.  No constipation. Genitourinary: Negative for dysuria. Musculoskeletal: Negative for back pain. Skin: Negative for rash. Neurological: Negative for headaches, focal weakness or numbness.  10-point ROS otherwise negative.  ____________________________________________   PHYSICAL EXAM:  VITAL SIGNS: ED Triage Vitals  Enc Vitals Group     BP 04/17/15 1611 117/89 mmHg     Pulse Rate 04/17/15 1611 77     Resp  04/17/15 1611 20     Temp 04/17/15 1611 98.9 F (  37.2 C)     Temp Source 04/17/15 1611 Oral     SpO2 04/17/15 1611 95 %     Weight 04/17/15 1611 209 lb 9.6 oz (95.074 kg)     Height 04/17/15 1611 5\' 3"  (1.6 m)     Head Cir --      Peak Flow --      Pain Score 04/17/15 1614 2     Pain Loc --      Pain Edu? --      Excl. in [place]? --     Constitutional: Alert and oriented. Nontoxic-appearing and in no acute distress. Eyes: Conjunctivae are normal. PERRL. EOMI. Head: Atraumatic. Nose: No congestion/rhinnorhea. Mouth/Throat: Mucous membranes are moist.  Oropharynx non-erythematous. Neck: No stridor. No cervical spine tenderness to palpation. Cardiovascular: Normal rate, regular rhythm. Grossly normal heart sounds.  Good peripheral circulation. Respiratory: No tachypnea or increased work of breathing but the patient does have faint expiratory wheeze with moderate air movement. Gastrointestinal: Soft and nontender. No distention.  No CVA tenderness. Genitourinary: deferred Musculoskeletal: No lower extremity tenderness nor edema.  No joint effusions. There is a 60 m area of ecchymosis in the midline of the upper thoracic spine with associated tenderness, no bony step-off or deformity. No midline tenderness throughout the lumbar sacral spine. Pelvis stable to rock and compression. Full active painless range of motion of bilateral hip joints. Neurologic:  Normal speech and language. No gross focal neurologic deficits are appreciated.  Skin:  Skin is warm, dry and intact. No rash noted. Psychiatric: Mood and affect are normal. Speech and behavior are normal.  ____________________________________________   LABS (all labs ordered are listed, but only abnormal results are displayed)  Labs Reviewed  CBC WITH DIFFERENTIAL/PLATELET - Abnormal; Notable for the following:    RBC 3.66 (*)    Hemoglobin 11.1 (*)    HCT 32.8 (*)    RDW 15.6 (*)    Platelets 140 (*)    Neutro Abs 8.2 (*)    Lymphs  Abs 0.8 (*)    Monocytes Absolute 1.1 (*)    All other components within normal limits  COMPREHENSIVE METABOLIC PANEL - Abnormal; Notable for the following:    Glucose, Bld 107 (*)    BUN 21 (*)    Creatinine, Ser 1.09 (*)    Calcium 8.7 (*)    Total Protein 6.4 (*)    ALT 9 (*)    GFR calc non Af Amer 46 (*)    GFR calc Af Amer 53 (*)    All other components within normal limits  URINALYSIS COMPLETEWITH MICROSCOPIC ([facility] ONLY) - Abnormal; Notable for the following:    Color, Urine YELLOW (*)    APPearance CLEAR (*)    Bacteria, UA RARE (*)    Squamous Epithelial / LPF 0-5 (*)    All other components within normal limits  TROPONIN I   ____________________________________________  EKG  ED ECG REPORT I, Joanne Gavel, the attending physician, personally viewed and interpreted this ECG.   Date: 04/17/2015  EKG Time: 16:20  Rate: 72  Rhythm: normal sinus rhythm  Axis: normal  Intervals:none  ST&T Change: No acute ST elevation  ____________________________________________  RADIOLOGY  CXR FINDINGS: Minimal enlargement of cardiac silhouette.  Atherosclerotic calcification aorta.  Mediastinal contours and pulmonary vascularity normal.  Emphysematous and bronchitic changes consistent with COPD.  Minimal bibasilar atelectasis greater on RIGHT.  No acute infiltrate, pleural effusion or pneumothorax.  Bones demineralized with chronic thoracic spine compression deformities  at approximately T7 and T9.  IMPRESSION: COPD changes with minimal bibasilar atelectasis greater on RIGHT.  Thoracic spine xray FINDINGS: Twelve pairs of ribs.  Bones demineralized.  Superior endplate compression deformities of 2 lower thoracic vertebra appear stable, likely T7 and T9.  No new thoracic spine compression fractures identified.  Visualized posterior ribs intact.  Multilevel disc space narrowing and endplate spur formation.  IMPRESSION: Osseous demineralization with  degenerative disc disease changes thoracic spine.  2 stable thoracic compression deformities, likely T7 and T9.  No definite acute abnormalities.  ____________________________________________   PROCEDURES  Procedure(s) performed: None  Critical Care performed: No  ____________________________________________   INITIAL IMPRESSION / ASSESSMENT AND PLAN / ED COURSE  Pertinent labs & imaging results that were available during my care of the patient were reviewed by me and considered in my medical decision making (see chart for details).  Joy [name] is a 82 y.o. female with history of hypertension, COPD with chronic 3 L home oxygen requirement, coronary artery disease, hyperlipidemia, Alzheimer's dementia who presents for evaluation of generalized weakness and worsening cough over the past 4 days in the setting of recent bronchitis diagnosis. On exam, she is nontoxic appearing in no acute distress but she does have X [place]ie wheeze and moderate air movement, will give DuoNeb and steroids, obtain chest x-ray to evaluate for pneumonia. She has an intact neurological exam and apart from the small ecchymosis in the thoracic spine, there are no other traumatic findings. We'll obtain screening labs, EKG, reassess for disposition.   ----------------------------------------- 8:53 PM on 04/17/2015 ----------------------------------------- EKG not consistent with acute ischemia. Chest x-ray shows no evidence of pneumonia. Thoracic spine x-ray shows no acute bony abnormality. CBC with chronic stable anemia, hemoglobin 11.1. CMP with mild creatinine elevation at 1.09, IV fluids given. Negative troponin. Urinalysis is not consistent with infection. Patient reports that she feels much better after IV fluids. We discussed treatment for bronchitis with albuterol, continuation of Z-Pak. Daughter reports that the patient has had severe reactions to steroids in the past so we will not add these at this  time. We discussed return precautions, need for close PCP follow-up and all are comfortable with the discharge plan. DC home. O2 sat 98% on her home 3L oxygen requirement.   ____________________________________________   FINAL CLINICAL IMPRESSION(S) / ED DIAGNOSES  Final diagnoses:  Weakness  Chronic obstructive pulmonary disease, unspecified COPD type ([place])  Bronchitis      Joanne Gavel, MD 04/17/15 2055  Joanne Gavel, MD 04/17/15 2056

## 2015-04-19 ENCOUNTER — Emergency Department: Payer: Medicare Other

## 2015-04-19 ENCOUNTER — Emergency Department
Admission: EM | Admit: 2015-04-19 | Discharge: 2015-04-19 | Disposition: A | Discharge status: Home / Self Care | Payer: Medicare Other | Attending: Emergency Medicine | Admitting: Emergency Medicine

## 2015-04-19 DIAGNOSIS — J441 Chronic obstructive pulmonary disease with (acute) exacerbation: Secondary | ICD-10-CM | POA: Diagnosis not present

## 2015-04-19 DIAGNOSIS — I5032 Chronic diastolic (congestive) heart failure: Secondary | ICD-10-CM | POA: Insufficient documentation

## 2015-04-19 DIAGNOSIS — N183 Chronic kidney disease, stage 3 (moderate): Secondary | ICD-10-CM | POA: Diagnosis not present

## 2015-04-19 DIAGNOSIS — Z8673 Personal history of transient ischemic attack (TIA), and cerebral infarction without residual deficits: Secondary | ICD-10-CM | POA: Insufficient documentation

## 2015-04-19 DIAGNOSIS — I2510 Atherosclerotic heart disease of native coronary artery without angina pectoris: Secondary | ICD-10-CM | POA: Diagnosis not present

## 2015-04-19 DIAGNOSIS — K219 Gastro-esophageal reflux disease without esophagitis: Secondary | ICD-10-CM | POA: Insufficient documentation

## 2015-04-19 DIAGNOSIS — F418 Other specified anxiety disorders: Secondary | ICD-10-CM | POA: Diagnosis not present

## 2015-04-19 DIAGNOSIS — I130 Hypertensive heart and chronic kidney disease with heart failure and stage 1 through stage 4 chronic kidney disease, or unspecified chronic kidney disease: Secondary | ICD-10-CM | POA: Diagnosis not present

## 2015-04-19 DIAGNOSIS — G309 Alzheimer's disease, unspecified: Secondary | ICD-10-CM | POA: Insufficient documentation

## 2015-04-19 DIAGNOSIS — R0602 Shortness of breath: Secondary | ICD-10-CM | POA: Diagnosis not present

## 2015-04-19 DIAGNOSIS — E785 Hyperlipidemia, unspecified: Secondary | ICD-10-CM | POA: Insufficient documentation

## 2015-04-19 DIAGNOSIS — Z87891 Personal history of nicotine dependence: Secondary | ICD-10-CM | POA: Insufficient documentation

## 2015-04-19 DIAGNOSIS — Z79899 Other long term (current) drug therapy: Secondary | ICD-10-CM | POA: Insufficient documentation

## 2015-04-19 LAB — BASIC METABOLIC PANEL
ANION GAP: 7 (ref 5–15)
BUN: 17 mg/dL (ref 6–20)
CHLORIDE: 104 mmol/L (ref 101–111)
CO2: 31 mmol/L (ref 22–32)
Calcium: 8.7 mg/dL — ABNORMAL LOW (ref 8.9–10.3)
Creatinine, Ser: 1.15 mg/dL — ABNORMAL HIGH (ref 0.44–1.00)
GFR calc non Af Amer: 43 mL/min — ABNORMAL LOW (ref >60)
GFR, EST AFRICAN AMERICAN: 50 mL/min — AB (ref >60)
Glucose, Bld: 128 mg/dL — ABNORMAL HIGH (ref 65–99)
POTASSIUM: 3.4 mmol/L — AB (ref 3.5–5.1)
Sodium: 142 mmol/L (ref 135–145)

## 2015-04-19 LAB — CBC
HCT: 32.2 % — ABNORMAL LOW (ref 35.0–47.0)
HEMOGLOBIN: 10.8 g/dL — AB (ref 12.0–16.0)
MCH: 29.9 pg (ref 26.0–34.0)
MCHC: 33.6 g/dL (ref 32.0–36.0)
MCV: 89 fL (ref 80.0–100.0)
Platelets: 162 10*3/uL (ref 150–440)
RBC: 3.62 MIL/uL — AB (ref 3.80–5.20)
RDW: 15.1 % — ABNORMAL HIGH (ref 11.5–14.5)
WBC: 9.1 10*3/uL (ref 3.6–11.0)

## 2015-04-19 LAB — TROPONIN I: Troponin I: <0.03 ng/mL (ref <0.031)

## 2015-04-19 MED ORDER — PREDNISONE 20 MG PO TABS: 60.0000 mg | ORAL_TABLET | Freq: Every day | ORAL | Status: DC

## 2015-04-19 MED ORDER — METHYLPREDNISOLONE SODIUM SUCC 125 MG IJ SOLR
125.0000 mg | Freq: Once | INTRAMUSCULAR | Status: AC
  Administered 2015-04-19: 125 mg via INTRAVENOUS
  Filled 2015-04-19: qty 2

## 2015-04-19 MED ORDER — IPRATROPIUM-ALBUTEROL 0.5-2.5 (3) MG/3ML IN SOLN
9.0000 mL | Freq: Four times a day (QID) | RESPIRATORY_TRACT | Status: DC
  Administered 2015-04-19: 9 mL via RESPIRATORY_TRACT
  Filled 2015-04-19: qty 9

## 2015-04-19 NOTE — ED Notes (Signed)
Pt reports seen here Thursday diagnosed with bronchitis. Done with Z pack abx. Fell Thursday. Pt. Reports worsening of her SOB. Currently on 3L [place] o2 sat 87%

## 2015-04-19 NOTE — ED Notes (Signed)
Patient transported to X-ray 

## 2015-04-19 NOTE — ED Provider Notes (Addendum)
[facility] Emergency Department Provider Note  ____________________________________________  Time seen: Approximately 4:20 PM  I have reviewed the triage vital signs and the nursing notes.   HISTORY  Chief Complaint Shortness of Breath   HPI Joy [name] is a 82 y.o. female with a history of COPD who is bouncing back to the emergency department today with worsening shortness of breath. She was originally diagnosed with bronchitis this past Monday and given a prescription for Z-Pak. She then came to the emergency room on Wednesday for worsening cough and generalized weakness. She was treated for bronchospasm this past Wednesday and discharged home with a nebulizer machine. She was not put on steroids because of past. Reaction to steroids. However, when asked further about the reaction to steroids the family says that this was after 6 month course of steroids and that with a taper or short course she has done well. The patient is denying any pain right now. She says that she has had a cough over the past week. She says that her symptoms have not been improving with the current treatment and that she is now on 4 L of oxygen to maintain her saturations which is increased from her baseline 2 L. She says that she is becoming especially short of breath when exerting herself and walking.   Past Medical History  Diagnosis Date  . HTN (hypertension)   . COPD (chronic obstructive pulmonary disease) ([place])   . Depression   . Thyroid disease   . PMR (polymyalgia rheumatica) (HCC)     with h/o steroid induced myopathy  . Anxiety   . Diastolic dysfunction     Echo 05/11/2010, EF 65-70%  . TIA (transient ischemic attack)     11/05 s/p L CEA  . Kidney infection   . Carotid artery occlusion   . CAD (coronary artery disease)     h/o NSTEMI, LAD stent placement 10/04, cath 05/11/2010  . HLD (hyperlipidemia)   . Alzheimer disease     Patient Active Problem List   Diagnosis  Date Noted  . Chest pain 02/13/2015  . Allergic rhinitis 11/04/2014  . Chronic diastolic heart failure ([place]) 07/22/2014  . CKD (chronic kidney disease) stage 3, GFR 30-59 ml/min 06/11/2014  . Carotid artery disease ([place]) 04/08/2014  . Nevus of cheek 10/30/2013  . CAD (coronary artery disease)   . HLD (hyperlipidemia)   . Lung nodule 01/15/2013  . Memory loss 01/15/2013  . Weakness generalized 12/26/2012  . Incontinence 12/26/2012  . GERD (gastroesophageal reflux disease) 09/06/2012  . Neuropathy ([place]) 01/14/2011  . Insomnia 01/14/2011  . MELAS (mitochondrial encephalopathy, lactic acidosis and stroke-like episodes) ([place]) 11/02/2010  . HTN (hypertension)   . COPD (chronic obstructive pulmonary disease) ([place])   . Depression   . Thyroid disease     Past Surgical History  Procedure Laterality Date  . Cholecystectomy    . Carotid endarterectomy      left with stent placement 11/2003  . Iliac artery stent      right, 2001  . Cardiac catheterization      unc  . Cardiac catheterization    . Cardiac catheterization    . Lung biopsy      Current Outpatient Rx  Name  Route  Sig  Dispense  Refill  . albuterol (PROVENTIL HFA;VENTOLIN HFA) 108 (90 Base) MCG/ACT inhaler   Inhalation   Inhale 2 puffs into the lungs every 6 (six) hours as needed for wheezing.   1 Inhaler  0   . albuterol (PROVENTIL HFA;VENTOLIN HFA) 108 (90 Base) MCG/ACT inhaler   Inhalation   Inhale 2 puffs into the lungs every 6 (six) hours as needed for wheezing or shortness of breath.   1 Inhaler   0   . amLODipine (NORVASC) 10 MG tablet   Oral   Take 10 mg by mouth daily.         Marland Kitchen atorvastatin (LIPITOR) 40 MG tablet   Oral   Take 40 mg by mouth daily.         Marland Kitchen azithromycin (ZITHROMAX Z-PAK) 250 MG tablet      Take 2 tablets (500 mg) on  Day 1,  followed by 1 tablet (250 mg) once daily on Days 2 through 5.   6 each   0   . clopidogrel (PLAVIX) 75 MG tablet   Oral   Take 75 mg by mouth  daily.         . Fluticasone-Salmeterol (ADVAIR) 250-50 MCG/DOSE AEPB   Inhalation   Inhale 1 puff into the lungs 2 (two) times daily.         . furosemide (LASIX) 40 MG tablet   Oral   Take 40 mg by mouth daily.         Marland Kitchen gabapentin (NEURONTIN) 800 MG tablet   Oral   Take 800 mg by mouth 2 (two) times daily.         Marland Kitchen levothyroxine (SYNTHROID, LEVOTHROID) 25 MCG tablet   Oral   Take 25 mcg by mouth daily before breakfast.         . lisinopril (PRINIVIL,ZESTRIL) 20 MG tablet   Oral   Take 20 mg by mouth daily.         . nitroGLYCERIN (NITROSTAT) 0.4 MG SL tablet   Sublingual   Place 0.4 mg under the tongue every 5 (five) minutes as needed.           . ranitidine (ZANTAC) 300 MG tablet   Oral   Take 300 mg by mouth at bedtime.         . sertraline (ZOLOFT) 100 MG tablet   Oral   Take 100 mg by mouth daily.         Marland Kitchen terconazole (TERAZOL 3) 0.8 % vaginal cream   Vaginal   Place 1 applicator vaginally at bedtime.   20 g   0   . tiotropium (SPIRIVA) 18 MCG inhalation capsule   Inhalation   Place 18 mcg into inhaler and inhale daily.         . traZODone (DESYREL) 50 MG tablet   Oral   Take 25-50 mg by mouth at bedtime as needed for sleep.         . vitamin B-12 (CYANOCOBALAMIN) 1000 MCG tablet   Oral   Take 1,000 mcg by mouth daily.         . Vitamin D, Ergocalciferol, (DRISDOL) 50000 units CAPS capsule   Oral   Take 50,000 Units by mouth every 7 (seven) days.           Allergies Codeine; Prednisone; and Shellfish allergy  No family history on file.  Social History Social History  Substance Use Topics  . Smoking status: Former Smoker -- 1.00 packs/day for 25 years    Types: Cigarettes    Quit date: 01/05/1996  . Smokeless tobacco: Never Used  . Alcohol Use: No    Review of Systems Constitutional: No fever/chills Eyes: No visual changes. ENT: No  sore throat. Cardiovascular: Denies chest pain. Respiratory: As  above Gastrointestinal: No abdominal pain.  No nausea, no vomiting.  No diarrhea.  No constipation. Genitourinary: Negative for dysuria. Musculoskeletal: Negative for back pain. Skin: Negative for rash. Neurological: Negative for headaches, focal weakness or numbness.  10-point ROS otherwise negative.  ____________________________________________   PHYSICAL EXAM:  VITAL SIGNS: ED Triage Vitals  Enc Vitals Group     BP 04/19/15 1236 106/88 mmHg     Pulse Rate 04/19/15 1236 87     Resp 04/19/15 1236 22     Temp 04/19/15 1236 98.5 F (36.9 C)     Temp src --      SpO2 04/19/15 1236 56 %     Weight 04/19/15 1236 195 lb (88.451 kg)     Height 04/19/15 1236 5\' 4"  (1.626 m)     Head Cir --      Peak Flow --      Pain Score 04/19/15 1240 8     Pain Loc --      Pain Edu? --      Excl. in [place]? --     Constitutional: Alert and oriented. Well appearing and in no acute distress. Eyes: Conjunctivae are normal. PERRL. EOMI. Head: Atraumatic. Nose: No congestion/rhinnorhea. Mouth/Throat: Mucous membranes are moist.   Neck: No stridor.   Cardiovascular: Normal rate, regular rhythm. Grossly normal heart sounds.  Good peripheral circulation. Respiratory: Normal respiratory effort.  No retractions. Mildly prolonged respiratory phase with an x-ray wheezing. Gastrointestinal: Soft and nontender. No distention. No abdominal bruits.  Musculoskeletal: No lower extremity tenderness nor edema.  No joint effusions. Neurologic:  Normal speech and language. No gross focal neurologic deficits are appreciated.  Skin:  Skin is warm, dry and intact. No rash noted. Psychiatric: Mood and affect are normal. Speech and behavior are normal.  ____________________________________________   LABS (all labs ordered are listed, but only abnormal results are displayed)  Labs Reviewed  BASIC METABOLIC PANEL - Abnormal; Notable for the following:    Potassium 3.4 (*)    Glucose, Bld 128 (*)    Creatinine,  Ser 1.15 (*)    Calcium 8.7 (*)    GFR calc non Af Amer 43 (*)    GFR calc Af Amer 50 (*)    All other components within normal limits  CBC - Abnormal; Notable for the following:    RBC 3.62 (*)    Hemoglobin 10.8 (*)    HCT 32.2 (*)    RDW 15.1 (*)    All other components within normal limits  TROPONIN I   ____________________________________________  EKG  ED ECG REPORT I, Doran Stabler, the attending physician, personally viewed and interpreted this ECG.   Date: 04/19/2015  EKG Time: 1259  Rate: 82  Rhythm: normal sinus rhythm  Axis: Normal  Intervals:none  ST&T Change: No ST segment elevation or depression. No abnormal T-wave inversion. Nonspecific T-wave abnormality likely read by the machine secondary to poor baseline.  ____________________________________________  RADIOLOGY   IMPRESSION: 1. No acute abnormality. 2. Minimal chronic bronchitic changes and chronic interstitial lung disease.   Electronically Signed By: Claudie Revering M.D. ____________________________________________   PROCEDURES    ____________________________________________   INITIAL IMPRESSION / ASSESSMENT AND PLAN / ED COURSE  Pertinent labs & imaging results that were available during my care of the patient were reviewed by me and considered in my medical decision making (see chart for details).  We'll treat with 2 nebs and steroids and reassessed.  Was hypoxic to 87% on 3 L of nasal cannula O2 upon arrival to the emergency department today.  ----------------------------------------- 6:55 PM on 04/19/2015 -----------------------------------------  Patient says that she feels much improved after DuoNeb nebs and steroids. She is now with a 96% oxygen saturation on her baseline 2 L nasal cannula. She was walked on her baseline 2 L nasal cannula and desatted to 90 and 89%. However, she is able to walk fast without any feeling of shortness of breath. I had a likely discussed with  the patient as well as the family to make sure to continue with her nebulizer treatments at home as well as her home oxygen. I will discharge her home with steroids. They're given strict return precautions for any worsening or concerning symptoms especially worsening shortness of breath and cough. She is already completed a course of antibiotics. I will give her a steroid burst to go home with. They're understanding of the plan and willing to comply. Patient had an oxygen saturation of 56% documented upon initial evaluation. However, I'm skeptical of this reading given the patient's overall clinical presentation. She was never in any sort of respiratory distress. She is always the dog in full sentences. I think may be this was an issue with the picking up of the actual reading from the machine. ____________________________________________   FINAL CLINICAL IMPRESSION(S) / ED DIAGNOSES  COPD exacerbation.    Orbie Pyo, MD 04/19/15 1856  Additionally, the lungs were re-auscultated and the patient is now clear to all fields.  Orbie Pyo, MD 04/19/15 [phone]

## 2015-04-21 ENCOUNTER — Telehealth: Payer: Self-pay | Admitting: Family Medicine

## 2015-04-21 ENCOUNTER — Telehealth: Payer: Self-pay

## 2015-04-21 NOTE — Telephone Encounter (Signed)
See 04/21/15 phone note.

## 2015-04-21 NOTE — Telephone Encounter (Signed)
PLEASE NOTE: All timestamps contained within this report are represented as [place] Standard Time. CONFIDENTIALTY NOTICE: This fax transmission is intended only for the addressee. It contains information that is legally privileged, confidential or otherwise protected from use or disclosure. If you are not the intended recipient, you are strictly prohibited from reviewing, disclosing, copying using or disseminating any of this information or taking any action in reliance on or regarding this information. If you have received this fax in error, please notify us immediately by telephone so that we can arrange for its return to [place]. Phone: [phone], Toll-Free: [phone], Fax: [phone] Page: 1 of 4 Call Id: [id number] [place] Patient Name: Joy [name] Gender: Unknown DOB: [birth year] Age: 82 Y 60 M 5 D Return Phone Number: [phone] (Primary) Address: City/State/Zip: [place] Client [place] Night - Client Client Site [place] Physician Arnette Norris Contact Type Call Who Is Calling Patient / Member / Family / Caregiver Call Type Triage / Clinical Caller Name Katie Relationship To Patient Daughter Return Phone Number [phone] (Primary) Chief Complaint Cough Reason for Call Symptomatic / Request for Health Information Initial Comment caller states her mother fell yesterday and hit her back - she was seen in the ofc on Monday - dx with bronchitis - dtr would like to get an rx called in for albuterol for a nebulizer PreDisposition Call Doctor Translation No Nurse Assessment Nurse: Raynelle Dick, RN, Jonita Albee Date/Time (Eastern Time): 04/18/2015 9:59:04 AM Confirm and document reason for call. If symptomatic, describe symptoms. You must click the next button to save text entered. ---Caller states that her mother was seen Monday and  dx with bronchitis, had a fall yesterday and bruised back, seen at ED yesterday for injury and breathing. States wanting to get a prescription for albuterol vials for nebulizer because mother is having difficulty using her albuterol inhaler. Audible wheeze right now, and cough, but no difficulty breathing. Has the patient traveled out of the country within the last 30 days? ---No Does the patient have any new or worsening symptoms? ---Yes Will a triage be completed? ---Yes Related visit to physician within the last 2 weeks? ---Yes Does the PT have any chronic conditions? (i.e. diabetes, asthma, etc.) ---Yes List chronic conditions. ---COPD, CHF, cryoglobunemia, neuropathy, Alzheimer disease, high cholesterol, depression Is this a behavioral health or substance abuse call? ---No PLEASE NOTE: All timestamps contained within this report are represented as [place] Standard Time. CONFIDENTIALTY NOTICE: This fax transmission is intended only for the addressee. It contains information that is legally privileged, confidential or otherwise protected from use or disclosure. If you are not the intended recipient, you are strictly prohibited from reviewing, disclosing, copying using or disseminating any of this information or taking any action in reliance on or regarding this information. If you have received this fax in error, please notify us immediately by telephone so that we can arrange for its return to [place]. Phone: [phone], Toll-Free: [phone], Fax: [phone] Page: 2 of 4 Call Id: [id number] Guidelines Guideline Title Affirmed Question Affirmed Notes Nurse Date/Time Eilene Ghazi Time) Asthma Attack [1] Mild wheezing is intermittent AND [2] persists > 3 days Humphrey, RN, Laticia 04/18/2015 10:08:31 AM Disp. Time Eilene Ghazi Time) Disposition Final User 04/18/2015 10:32:43 AM Paged On Call back to [facility], [place], [place] 04/18/2015 10:38:54 AM Pharmacy Call [place], RN,  Jonita Albee Reason: Spoke to [place] at [place] [phone]. No mask  for inhaler available and proventil hfa filled and picked up on 04/15/15. Nurse to contact on-call for further directives as patient unable to use the inhaler effectively. 04/18/2015 10:40:54 AM Send to [place], RN, Jonita Albee 04/18/2015 10:56:43 AM Paged On Call back to [facility], [place], [place] 04/18/2015 11:41:17 AM Pharmacy Call Humph[place], RN, Jonita Albee Reason: Spoke to Mcchesney Landry at [place] [phone] 04/18/2015 11:41:36 AM Call Completed Humphrey, RN, Jonita Albee 04/18/2015 10:13:19 AM See PCP When Office is Open (within 3 days) Yes Humphrey, RN, Cheri Kearns Understands: Yes Disagree/Comply: Comply Care Advice Given Per Guideline SEE PCP WITHIN 3 DAYS: * You need to be seen within 2 or 3 days. Call your doctor during regular office hours and make an appointment. An urgent care center is often the best source of care if your doctor's office is closed or you can't get an appointment. NOTE: If office will be open tomorrow, tell caller to call then, not in 3 days. ASTHMA QUICK-RELIEF MEDICINE: * Start your quick-relief medicine (e.g., albuterol, salbutamol) at the first sign of any coughing or shortness of breath (don't wait for wheezing). * Use inhaler (2 puffs each time) or nebulizer every 4 hours. CALL BACK IF: * Wheezing is not improved after neb or inhaler * You become worse. CARE ADVICE given per Asthma Attack (Adult) guideline. PRESCRIPTION OPTION FOR ASTHMA QUICKRELIEF MEDICINE (E.G., ALBUTEROL) PER PROTOCOL: * If caller is out of inhaler or neb solution AND PCP approves calling in refill, do so per protocol. Verbal Orders/Maintenance Medications Medication Refill Route Dosage Regime Duration Admin Instructions User Name Albuterol Nebulisation PRN Current prescription for proventil HFA, ordered albuterol vials for nebulisation prn for wheezing and breathing difficulties up to 4x  per day. No refills and patient instructed to contact Humphrey, RN, L[place]ia PLEASE NOTE: All timestamps contained within this report are represented as [place] Standard Time. CONFIDENTIALTY NOTICE: This fax transmission is intended only for the addressee. It contains information that is legally privileged, confidential or otherwise protected from use or disclosure. If you are not the intended recipient, you are strictly prohibited from reviewing, disclosing, copying using or disseminating any of this information or taking any action in reliance on or regarding this information. If you have received this fax in error, please notify us immediately by telephone so that we can arrange for its return to [place]. Phone: [phone], Toll-Free: [phone], Fax: [phone] Page: 3 of 4 Call Id: [id number] Verbal Orders/Maintenance Medications Medication Refill Route Dosage Regime Duration Admin Instructions User Name office Monday for follow up. Verbal per Dr. Sarajane Jews. Paging DoctorName Phone DateTime Result/Outcome Message Type Notes Alysia Penna [id number] 04/18/2015 10:32:42 AM Paged On Call Back to Call Center Doctor Paged Please call Laticia at the call center [phone]. Alysia Penna [id number] 04/18/2015 10:56:43 AM Called On Call Provider - Left Message Doctor Paged Alysia Penna 04/18/2015 11:30:07 AM Spoke with On Call - General Message Result Gave report to Dr. Sarajane Jews, okay to call in proventil vials for nebulizer as patient is not able to use inhaler effectively. Doctor indicated no reason why ED could not have prescribed nebulizer. PLEASE NOTE: All timestamps contained within this report are represented as [place] Standard Time. CONFIDENTIALTY NOTICE: This fax transmission is intended only for the addressee. It contains information that is legally privileged, confidential or otherwise protected from use or disclosure. If you are not the intended recipient, you are strictly  prohibited from reviewing, disclosing, copying using or disseminating any of this information or taking any action in reliance  on or regarding this information. If you have received this fax in error, please notify us immediately by telephone so that we can arrange for its return to [place]. Phone: [phone], Toll-Free: [phone], Fax: [phone] Page: 4 of 4 Call Id: [id number] [place] [address] [place] [address] [phone] Fax: [phone] [place] Night - Client [place] - Night Date: 04/18/2015 From: [facility] Department To: Arnette Norris Please sign the order for the approved drug(s) given by our call center nurse on your behalf. Fax to [phone] within 5 business days. Thank you. Date (Eastern Time): 04/18/2015 9:51:24 AM Triage RN: Fabio Bering, RN NAME: Luretha Rued PHONE NUMBER: [phone] (Primary) BIRTHDATE: March 05, 1930 ADDRESS: CITY/STATE/ZIP: [place] CALLER: Daughter NAME: Katie Rx Given Medication Refill Route Dosage Regime Duration Admin Instructions Albuterol Nebulisation PRN Current prescription for proventil HFA, ordered albuterol vials nebulisation prn for and breathing difficulties to 4x per day. No refills patient instructed to office Monday for Verbal per Dr. Sarajane Jews. MD Signature Date

## 2015-04-21 NOTE — Telephone Encounter (Signed)
Joy [name] called to update you   [name] has been to [facility] er thur and Saturday for bronchitis They have questions about meds What they should do next Please advise  Best number [phone]

## 2015-04-21 NOTE — Telephone Encounter (Signed)
Returned Joy [name]'s call. Answered her questions.

## 2015-04-23 ENCOUNTER — Telehealth: Payer: Self-pay

## 2015-04-23 NOTE — Telephone Encounter (Signed)
[name], pts POA, said that pt is not really improving; SOB upon any exertion;nonprod cough, wheezing on and off; no fever. Pt was seen ED on 04/21/15 and to f/u with Dr Deborra Medina in 1 week. [name] said pt could not wait until 04/28/15 to be seen and [name] could not bring pt on 04/28/15. Dr Deborra Medina oked 04/24/15 at 11:45; a 15 min appt. [name] scheduled appt on 04/24/15 at 11:45.

## 2015-04-24 ENCOUNTER — Encounter: Payer: Self-pay | Admitting: Family Medicine

## 2015-04-24 ENCOUNTER — Ambulatory Visit (INDEPENDENT_AMBULATORY_CARE_PROVIDER_SITE_OTHER): Payer: Medicare Other | Admitting: Family Medicine

## 2015-04-24 VITALS — BP 112/48 | HR 68 | Temp 98.9°F

## 2015-04-24 DIAGNOSIS — I6523 Occlusion and stenosis of bilateral carotid arteries: Secondary | ICD-10-CM | POA: Diagnosis not present

## 2015-04-24 DIAGNOSIS — J440 Chronic obstructive pulmonary disease with acute lower respiratory infection: Secondary | ICD-10-CM

## 2015-04-24 NOTE — Progress Notes (Signed)
Subjective:   Patient ID: Joy [name], female    DOB: March 30, 1930, 82 y.o.   MRN: [id number]  Joy [name] is a pleasant 82 y.o. year old female who presents to clinic today with her daughter, [name], for Hospitalization Follow-up  on 04/24/2015  HPI:  COPD exacerbation-  Initially seen by me on 04/15/15 for 7 days of URI symptoms- note reviewed. eRx sent for zpack, advised to continue inhalers.  Avoided prednisone at that time as she has been intolerant of oral steroids in past.  Two days later, on 04/17/15, was seen in ED for increased O2 requirement (3 L at baseline), worsening cough and weakness/fall. Notes and studies reviewed.  EKG neg UA eg. Given fluids and advised follow up with me.  Returned to ED two days later on 04/19/15 for worsening SOB. Note reviewed.  CXR neg for acute findings.  Sent home with course of oral steroids.  Last done of prednisone today.  Does feel a little better.  O2 requirement improving- back to baseline. Still has a cough. Nebs are helping as well.  Dg Chest 2 View  04/19/2015  CLINICAL DATA:  Worsening shortness of breath. Recently diagnosed with bronchitis. Ex-smoker. EXAM: CHEST  2 VIEW COMPARISON:  04/17/2015. FINDINGS: Normal sized heart. Clear lungs. Minimally prominent interstitial markings and central peribronchial thickening. Linear scar at the right lung base. Thoracic spine degenerative changes. Diffuse osteopenia. IMPRESSION: 1. No acute abnormality. 2. Minimal chronic bronchitic changes and chronic interstitial lung disease. Electronically Signed   By: Claudie Revering M.D.   On: 04/19/2015 14:06   Dg Chest 2 View  04/17/2015  CLINICAL DATA:  Diagnosed with bronchitis on 04/14/2015, increased oxygen requirement EXAM: CHEST  2 VIEW COMPARISON:  01/15/2015 FINDINGS: Minimal enlargement of cardiac silhouette. Atherosclerotic calcification aorta. Mediastinal contours and pulmonary vascularity normal. Emphysematous and bronchitic changes  consistent with COPD. Minimal bibasilar atelectasis greater on RIGHT. No acute infiltrate, pleural effusion or pneumothorax. Bones demineralized with chronic thoracic spine compression deformities at approximately T7 and T9. IMPRESSION: COPD changes with minimal bibasilar atelectasis greater on RIGHT. Electronically Signed   By: Lavonia Dana M.D.   On: 04/17/2015 17:17   Dg Thoracic Spine 2 View  04/17/2015  CLINICAL DATA:  [place] last night, bruising on back, diagnosed with bronchitis on 04/10, history COPD, hypertension EXAM: THORACIC SPINE 2 VIEWS COMPARISON:  Chest radiographs 01/15/2015 FINDINGS: Twelve pairs of ribs. Bones demineralized. Superior endplate compression deformities of 2 lower thoracic vertebra appear stable, likely T7 and T9. No new thoracic spine compression fractures identified. Visualized posterior ribs intact. Multilevel disc space narrowing and endplate spur formation. IMPRESSION: Osseous demineralization with degenerative disc disease changes thoracic spine. 2 stable thoracic compression deformities, likely T7 and T9. No definite acute abnormalities. Electronically Signed   By: Lavonia Dana M.D.   On: 04/17/2015 17:17      Review of Systems  Constitutional: Positive for fatigue. Negative for fever.  HENT: Positive for congestion.   Respiratory: Positive for cough and wheezing. Negative for shortness of breath.   Cardiovascular: Negative.   Musculoskeletal: Negative.   Skin: Negative.   Neurological: Negative.   Psychiatric/Behavioral: Negative.   All other systems reviewed and are negative.      Objective:    BP 112/48 mmHg  Pulse 68  Temp(Src) 98.9 F (37.2 C) (Oral)  Wt   SpO2 94%   Physical Exam  Constitutional: She is oriented to person, place, and time. She appears well-developed and well-nourished. No distress.  HENT:  Head: Normocephalic and atraumatic.  O2 per [place]  Eyes: Conjunctivae are normal.  Cardiovascular: Normal rate.   Pulmonary/Chest: Effort  normal and breath sounds normal. No respiratory distress. She has no wheezes.  +harsh, wet, loose cough  Neurological: She is alert and oriented to person, place, and time. No cranial nerve deficit.  Skin: Skin is warm and dry. She is not diaphoretic.  Psychiatric: She has a normal mood and affect. Her behavior is normal. Judgment and thought content normal.  Nursing note and vitals reviewed.         Assessment & Plan:   No diagnosis found. No Follow-up on file.

## 2015-04-24 NOTE — Assessment & Plan Note (Signed)
Lung exam reassuring as well as decreased O2 requirement. No further rx added today. Continue supportive care. Call or return to clinic prn if these symptoms worsen or fail to improve as anticipated. The patient indicates understanding of these issues and agrees with the plan.

## 2015-04-24 NOTE — Progress Notes (Signed)
Pre visit review using our clinic review tool, if applicable. No additional management support is needed unless otherwise documented below in the visit note. 

## 2015-04-25 ENCOUNTER — Telehealth: Payer: Self-pay | Admitting: Family Medicine

## 2015-04-25 ENCOUNTER — Ambulatory Visit (INDEPENDENT_AMBULATORY_CARE_PROVIDER_SITE_OTHER)
Admission: RE | Admit: 2015-04-25 | Discharge: 2015-04-25 | Disposition: A | Discharge status: Home / Self Care | Payer: Medicare Other | Source: Ambulatory Visit | Attending: Family Medicine | Admitting: Family Medicine

## 2015-04-25 ENCOUNTER — Encounter: Payer: Self-pay | Admitting: Family Medicine

## 2015-04-25 ENCOUNTER — Ambulatory Visit (INDEPENDENT_AMBULATORY_CARE_PROVIDER_SITE_OTHER): Payer: Medicare Other | Admitting: Family Medicine

## 2015-04-25 VITALS — BP 96/48 | HR 66 | Temp 98.3°F | Wt 197.5 lb

## 2015-04-25 DIAGNOSIS — I2510 Atherosclerotic heart disease of native coronary artery without angina pectoris: Secondary | ICD-10-CM

## 2015-04-25 DIAGNOSIS — I6523 Occlusion and stenosis of bilateral carotid arteries: Secondary | ICD-10-CM | POA: Diagnosis not present

## 2015-04-25 DIAGNOSIS — J440 Chronic obstructive pulmonary disease with acute lower respiratory infection: Secondary | ICD-10-CM

## 2015-04-25 DIAGNOSIS — J181 Lobar pneumonia, unspecified organism: Principal | ICD-10-CM

## 2015-04-25 DIAGNOSIS — J441 Chronic obstructive pulmonary disease with (acute) exacerbation: Secondary | ICD-10-CM

## 2015-04-25 DIAGNOSIS — J189 Pneumonia, unspecified organism: Secondary | ICD-10-CM | POA: Insufficient documentation

## 2015-04-25 DIAGNOSIS — I5032 Chronic diastolic (congestive) heart failure: Secondary | ICD-10-CM

## 2015-04-25 DIAGNOSIS — R531 Weakness: Secondary | ICD-10-CM

## 2015-04-25 DIAGNOSIS — R05 Cough: Secondary | ICD-10-CM | POA: Diagnosis not present

## 2015-04-25 DIAGNOSIS — N183 Chronic kidney disease, stage 3 unspecified: Secondary | ICD-10-CM

## 2015-04-25 MED ORDER — LEVOFLOXACIN 250 MG PO TABS: ORAL_TABLET | ORAL | Status: DC

## 2015-04-25 MED ORDER — PREDNISONE 20 MG PO TABS: ORAL_TABLET | ORAL | Status: DC

## 2015-04-25 NOTE — Progress Notes (Signed)
Pre visit review using our clinic review tool, if applicable. No additional management support is needed unless otherwise documented below in the visit note. 

## 2015-04-25 NOTE — Telephone Encounter (Signed)
Patient Name: Joy [name]  DOB: December 09, 1930    Initial Comment Caller states mother has COPD, dx with bronchitis. Was in ED twice, PCP office twice. Coughing, she is on 4 liters oxygen, sats at 89-90. Very tired, no trouble breathing.   Nurse Assessment  Nurse: Mallie Mussel, RN, Alveta Heimlich Date/Time Eilene Ghazi Time): 04/25/2015 2:00:42 PM  Confirm and document reason for call. If symptomatic, describe symptoms. You must click the next button to save text entered. ---Caller is not with her mother at present. She has had prednisone and breathing treatments. 3 way call offered. She connected me to her niece, Joy [name] who is with her at this time. Denies fever. Denies difficulty breathing, she is able to speak in complete sentences. She is still on 4L of 02. 02 sats are 90%. She has had a cough which began 12 days ago and its a non-productive cough. She was seen yesterday. She has had 2 chest x-rays in the last week. Her lungs are clear.  Has the patient traveled out of the country within the last 30 days? ---No  Does the patient have any new or worsening symptoms? ---Yes  Will a triage be completed? ---Yes  Related visit to physician within the last 2 weeks? ---Yes  Does the PT have any chronic conditions? (i.e. diabetes, asthma, etc.) ---Yes  List chronic conditions. ---COPD, Hypercholesterolemia, CHF, Neuropathy, Emphysema  Is this a behavioral health or substance abuse call? ---No     Guidelines    Guideline Title Affirmed Question Affirmed Notes  Breathing Difficulty [1] Longstanding difficulty breathing (e.g., CHF, COPD, emphysema) AND [2] WORSE than normal    Final Disposition User   See Physician within 4 Hours (or PCP triage) Mallie Mussel, RN, Alveta Heimlich    Comments  An appointment was scheduled for her to be seen by Dr. Leo Grosser at 3:00pm today.   Referrals  REFERRED TO PCP OFFICE   Disagree/Comply: Comply

## 2015-04-25 NOTE — Telephone Encounter (Signed)
Pt currently having OV

## 2015-04-25 NOTE — Assessment & Plan Note (Addendum)
Anticipate recurrent COPD exacerbation, concern for developing pneumonia. She is mildly dehydrated and hypotensive but continues taking amlodipine and lasix daily.  Will cut antihypertensives, start levaquin course (renally dosed) and prednisone taper, and start mucinex with plenty of water to mobilize mucous.  Pt not acutely toxic, mainly very fatigued - will try outpatient management but discussed in detail reasons to seek ER care over weekend - worsening dyspnea, hypoxia, fever. Pt and daughter express understanding, reliable pt/family, prefer to try home management of COPD exacerbation.

## 2015-04-25 NOTE — Progress Notes (Signed)
BP 96/48 mmHg  Pulse 66  Temp(Src) 98.3 F (36.8 C) (Oral)  Wt 197 lb 8 oz (89.585 kg)  SpO2 95% on 3L [place] at rest (initially 90% on 4L[place] - this improved with rest).  CC: hypoxia  Subjective:    Patient ID: Joy [name], female    DOB: 1930/12/11, 82 y.o.   MRN: [id number]  HPI: Joy [name] is a 82 y.o. female presenting on 04/25/2015 for Shortness of Breath   Presents with daughter [name]. Baseline oxygen requirement is 2L O2 by [place].   Pt of Dr Hulen Shouts new to me who lives at [facility] with known COPD, CAD (NSTEMI, LAD stent) and diastolic dysfunction, carotid stenosis, alzheimer's disease, PMR with steroid induced myopathy who was here yesterday with similar symptoms of dyspnea on 4L [place]. Seen initially 04/15/2015 with bronchitis, treated with zpack (O2 sat at that time 91%). Seen again 04/24/2015 (was on 3-4 L by [place]) for ER f/u visit x2 for COPD exacerbation, hypoxia with increased oxygen requirement and fall, initially treated with IVF then oral steroids added for 5d course. She finished prednisone course yesterday. CXR clear in hospital. She has been increasing water at home.   Presents today with daughter with concerns for worsening weakness, fatigue, increased oxygen requirement. Feeling tired, dizzy with coughing, wet sounding cough but unable to produce any sputum. + HA and PNDrainage. Worsening dyspnea, chest congestion. Denies wheezing. Denies fevers/chills, ear or tooth pain, sore throat or head congestion. Has not yet tried mucinex.   COPD regimen includes albuterol inhaler PRN (using nebs TID scheduled - usually doesn't need inhaler), advair 250/50 1 puff BID, and spiriva inhalation nightly.   She is on lasix and amlodipine - took meds this morning.  Unclear if currently taking lisinopril. Daughter called family who states she has not been receiving this medicine at home. Removed lisinopril from med list.   Relevant past medical, surgical, family and social history reviewed  and updated as indicated. Interim medical history since our last visit reviewed. Allergies and medications reviewed and updated. Current Outpatient Prescriptions on File Prior to Visit  Medication Sig  . albuterol (PROVENTIL HFA;VENTOLIN HFA) 108 (90 Base) MCG/ACT inhaler Inhale 2 puffs into the lungs every 6 (six) hours as needed for wheezing or shortness of breath.  Marland Kitchen amLODipine (NORVASC) 10 MG tablet Take 10 mg by mouth daily.  Marland Kitchen atorvastatin (LIPITOR) 40 MG tablet Take 40 mg by mouth daily.  . clopidogrel (PLAVIX) 75 MG tablet Take 75 mg by mouth daily.  . Fluticasone-Salmeterol (ADVAIR) 250-50 MCG/DOSE AEPB Inhale 1 puff into the lungs 2 (two) times daily.  . furosemide (LASIX) 40 MG tablet Take 40 mg by mouth daily.  Marland Kitchen gabapentin (NEURONTIN) 800 MG tablet Take 800 mg by mouth 2 (two) times daily.  Marland Kitchen levothyroxine (SYNTHROID, LEVOTHROID) 25 MCG tablet Take 25 mcg by mouth daily before breakfast.  . nitroGLYCERIN (NITROSTAT) 0.4 MG SL tablet Place 0.4 mg under the tongue every 5 (five) minutes as needed.    . ranitidine (ZANTAC) 300 MG tablet Take 300 mg by mouth at bedtime.  . sertraline (ZOLOFT) 100 MG tablet Take 100 mg by mouth daily.  Marland Kitchen tiotropium (SPIRIVA) 18 MCG inhalation capsule Place 18 mcg into inhaler and inhale daily.  . traZODone (DESYREL) 50 MG tablet Take 25-50 mg by mouth at bedtime as needed for sleep.  . vitamin B-12 (CYANOCOBALAMIN) 1000 MCG tablet Take 1,000 mcg by mouth daily.  . Vitamin D, Ergocalciferol, (DRISDOL) 50000 units CAPS capsule  Take 50,000 Units by mouth every 7 (seven) days.   No current facility-administered medications on file prior to visit.    Review of Systems Per HPI unless specifically indicated in ROS section     Objective:    BP 96/48 mmHg  Pulse 66  Temp(Src) 98.3 F (36.8 C) (Oral)  Wt 197 lb 8 oz (89.585 kg)  SpO2 95%  Wt Readings from Last 3 Encounters:  04/25/15 197 lb 8 oz (89.585 kg)  04/19/15 195 lb (88.451 kg)  04/17/15  209 lb 9.6 oz (95.074 kg)    Physical Exam  Constitutional: She appears well-developed and well-nourished. No distress.  Presents in wheelchair - not her norm  Tired appearing but nontoxic  HENT:  Head: Normocephalic and atraumatic.  Right Ear: Hearing, tympanic membrane, external ear and ear canal normal.  Left Ear: Hearing, tympanic membrane, external ear and ear canal normal.  Nose: No mucosal edema or rhinorrhea. Right sinus exhibits no maxillary sinus tenderness and no frontal sinus tenderness. Left sinus exhibits no maxillary sinus tenderness and no frontal sinus tenderness.  Mouth/Throat: Uvula is midline, oropharynx is clear and moist and mucous membranes are normal. No oropharyngeal exudate, posterior oropharyngeal edema, posterior oropharyngeal erythema or tonsillar abscesses.  Eyes: Conjunctivae and EOM are normal. Pupils are equal, round, and reactive to light. No scleral icterus.  Neck: Normal range of motion. Neck supple.  Cardiovascular: Normal rate, regular rhythm, normal heart sounds and intact distal pulses.   No murmur heard. Pulmonary/Chest: Effort normal. No respiratory distress. She has decreased breath sounds. She has no wheezes. She has rhonchi. She has rales (bilateral mid lung fields).  Musculoskeletal: She exhibits no edema.  Lymphadenopathy:    She has no cervical adenopathy.  Skin: Skin is warm and dry. No rash noted. There is pallor (mild).  Psychiatric: She has a normal mood and affect.  Nursing note and vitals reviewed.  After albuterol/atrovent neb: Improved air movement, improvement in rales, persistent rhonchi.  Results for orders placed or performed during the hospital encounter of [id number]  Basic metabolic panel  Result Value Ref Range   Sodium 142 135 - 145 mmol/L   Potassium 3.4 (L) 3.5 - 5.1 mmol/L   Chloride 104 101 - 111 mmol/L   CO2 31 22 - 32 mmol/L   Glucose, Bld 128 (H) 65 - 99 mg/dL   BUN 17 6 - 20 mg/dL   Creatinine, Ser 1.15 (H)  0.44 - 1.00 mg/dL   Calcium 8.7 (L) 8.9 - 10.3 mg/dL   GFR calc non Af Amer 43 (L) >60 mL/min   GFR calc Af Amer 50 (L) >60 mL/min   Anion gap 7 5 - 15  CBC  Result Value Ref Range   WBC 9.1 3.6 - 11.0 K/uL   RBC 3.62 (L) 3.80 - 5.20 MIL/uL   Hemoglobin 10.8 (L) 12.0 - 16.0 g/dL   HCT 32.2 (L) 35.0 - 47.0 %   MCV 89.0 80.0 - 100.0 fL   MCH 29.9 26.0 - 34.0 pg   MCHC 33.6 32.0 - 36.0 g/dL   RDW 15.1 (H) 11.5 - 14.5 %   Platelets 162 150 - 440 K/uL  Troponin I  Result Value Ref Range   Troponin I <0.03 <0.031 ng/mL   Dg Chest 2 View 04/19/2015 CLINICAL DATA: Worsening shortness of breath. Recently diagnosed with bronchitis. Ex-smoker. EXAM: CHEST 2 VIEW COMPARISON: 04/17/2015. FINDINGS: Normal sized heart. Clear lungs. Minimally prominent interstitial markings and central peribronchial thickening. Linear scar at the  right lung base. Thoracic spine degenerative changes. Diffuse osteopenia. IMPRESSION: 1. No acute abnormality. 2. Minimal chronic bronchitic changes and chronic interstitial lung disease. Electronically Signed By: Claudie Revering M.D. On: 04/19/2015 14:06   CHEST 2 VIEW COMPARISON: 04/19/2015. 01/15/2015. FINDINGS: The heart size and mediastinal contours are stable. There is mild aortic atherosclerosis. The lungs are hyperinflated. There is chronic right basilar scarring. There is increased density projecting over the lower thoracic spine on the lateral view which appears to correspond with increased left lower lobe airspace disease. There is no significant pleural effusion. Thoracic spine degenerative changes and mild anterior wedging in the mid thoracic spine are stable. No acute osseous findings are seen. IMPRESSION: 1. Increased density projecting over the lower thoracic spine on the lateral view suspicious for left lower lobe pneumonia. 2. Followup PA and lateral chest X-ray is recommended in 3-4 weeks following trial of antibiotic therapy to ensure  resolution and exclude underlying malignancy. Electronically Signed  By: Richardean Sale M.D.  On: 04/25/2015 17:00    Assessment & Plan:  Over 45 minutes were spent face-to-face with the patient during this encounter and >50% of that time was spent on counseling and coordination of care   Problem List Items Addressed This Visit    COPD (chronic obstructive pulmonary disease) ([place])   Relevant Medications   predniSONE (DELTASONE) 20 MG tablet   Weakness generalized   CAD (coronary artery disease)   CKD (chronic kidney disease) stage 3, GFR 30-59 ml/min   Chronic diastolic heart failure (HCC)   COPD exacerbation ([place])    Anticipate recurrent COPD exacerbation, concern for developing pneumonia. She is mildly dehydrated and hypotensive but continues taking amlodipine and lasix daily.  Will cut antihypertensives, start levaquin course (renally dosed) and prednisone taper, and start mucinex with plenty of water to mobilize mucous.  Pt not acutely toxic, mainly very fatigued - will try outpatient management but discussed in detail reasons to seek ER care over weekend - worsening dyspnea, hypoxia, fever. Pt and daughter express understanding, reliable pt/family, prefer to try home management of COPD exacerbation.       Relevant Medications   predniSONE (DELTASONE) 20 MG tablet   Other Relevant Orders   DG Chest 2 View (Completed)   Left lower lobe pneumonia - Primary    CXR returned suspicious for LLL PNA - on renally dosed levaquin + longer prednisone taper. Close outpt f/u next week.      Relevant Medications   levofloxacin (LEVAQUIN) 250 MG tablet       Follow up plan: Return in about 3 days (around 04/28/2015), or if symptoms worsen or fail to improve, for follow up visit.  Ria Bush, MD

## 2015-04-25 NOTE — Telephone Encounter (Signed)
Pt has appt on 04/25/15 at 3pm with Dr Darnell Level.

## 2015-04-25 NOTE — Assessment & Plan Note (Addendum)
CXR returned suspicious for LLL PNA - on renally dosed levaquin + longer prednisone taper. Close outpt f/u next week.

## 2015-04-25 NOTE — Patient Instructions (Addendum)
You have persistent COPD exacerbation.  Xray today.  Hold amlodipine over the weekend. Cut lasix in half over the weekend. Start prednisone and levaquin course sent to pharmacy.   Start plain mucinex or immediate release guaifenesin with large glass of water twice daily to mobilize mucous.  If worsening shortness of breath, cough, fever - please go back to ER over the weekend.  Return on Monday for follow up with Dr Deborra Medina or myself.

## 2015-04-25 NOTE — Telephone Encounter (Signed)
Joy [name] called  She had questions out 02 level

## 2015-04-28 ENCOUNTER — Encounter: Payer: Self-pay | Admitting: Family Medicine

## 2015-04-28 ENCOUNTER — Ambulatory Visit (INDEPENDENT_AMBULATORY_CARE_PROVIDER_SITE_OTHER): Payer: Medicare Other | Admitting: Family Medicine

## 2015-04-28 VITALS — BP 100/58 | HR 64 | Temp 98.2°F

## 2015-04-28 DIAGNOSIS — I10 Essential (primary) hypertension: Secondary | ICD-10-CM | POA: Diagnosis not present

## 2015-04-28 DIAGNOSIS — J181 Lobar pneumonia, unspecified organism: Secondary | ICD-10-CM

## 2015-04-28 DIAGNOSIS — J441 Chronic obstructive pulmonary disease with (acute) exacerbation: Secondary | ICD-10-CM | POA: Diagnosis not present

## 2015-04-28 DIAGNOSIS — J189 Pneumonia, unspecified organism: Secondary | ICD-10-CM

## 2015-04-28 DIAGNOSIS — I6523 Occlusion and stenosis of bilateral carotid arteries: Secondary | ICD-10-CM

## 2015-04-28 MED ORDER — ALBUTEROL SULFATE (2.5 MG/3ML) 0.083% IN NEBU: 2.5000 mg | INHALATION_SOLUTION | Freq: Four times a day (QID) | RESPIRATORY_TRACT | Status: DC | PRN

## 2015-04-28 NOTE — Assessment & Plan Note (Signed)
Improving with course of Levaquin. Continue to hold antihypertensives and monitor BP at home. Follow up in 1 week. Repeat CXR in 3 weeks. The patient indicates understanding of these issues and agrees with the plan.

## 2015-04-28 NOTE — Patient Instructions (Signed)
Keep holding blood pressure medication.  Please come see me next week.

## 2015-04-28 NOTE — Progress Notes (Signed)
Pre visit review using our clinic review tool, if applicable. No additional management support is needed unless otherwise documented below in the visit note. 

## 2015-04-28 NOTE — Progress Notes (Signed)
Subjective:   Patient ID: Joy [name], female    DOB: [birth year], 82 y.o.   MRN: [id number]  Joy [name] is a pleasant 82 y.o. year old female who presents to clinic today with her daughter, Joy [name], for Follow-up  on 04/28/2015  HPI:  Saw my partner, Dr. Darnell Level on 04/25/15.  Note reviewed.  She had been seen in ER twice prior and saw me as well as follow up.  Had been treated for COPD exacerbation with Zpack and course of steroids.  CXR was neg at [facility].  When she saw Dr. Darnell Level, she was having worsening weakness and increased O2 requirement.  Rhonchi and Rales on exam.  CXR was repeated in office which was concerning for LLL pneumonia.  Placed on levaquin and prednisone burst extended. Has also been holding her antihypertensives due to symptomatic hypotension.  Still weak but O2 requirement improving.  Nebs have helped.  Dg Chest 2 View  04/25/2015  CLINICAL DATA:  Worsening cough.  History of COPD. EXAM: CHEST  2 VIEW COMPARISON:  04/19/2015.  01/15/2015. FINDINGS: The heart size and mediastinal contours are stable. There is mild aortic atherosclerosis. The lungs are hyperinflated. There is chronic right basilar scarring. There is increased density projecting over the lower thoracic spine on the lateral view which appears to correspond with increased left lower lobe airspace disease. There is no significant pleural effusion. Thoracic spine degenerative changes and mild anterior wedging in the mid thoracic spine are stable. No acute osseous findings are seen. IMPRESSION: 1. Increased density projecting over the lower thoracic spine on the lateral view suspicious for left lower lobe pneumonia. 2. Followup PA and lateral chest X-ray is recommended in 3-4 weeks following trial of antibiotic therapy to ensure resolution and exclude underlying malignancy. Electronically Signed   By: Richardean Sale M.D.   On: 04/25/2015 17:00   Dg Chest 2 View  04/19/2015  CLINICAL DATA:  Worsening shortness  of breath. Recently diagnosed with bronchitis. Ex-smoker. EXAM: CHEST  2 VIEW COMPARISON:  04/17/2015. FINDINGS: Normal sized heart. Clear lungs. Minimally prominent interstitial markings and central peribronchial thickening. Linear scar at the right lung base. Thoracic spine degenerative changes. Diffuse osteopenia. IMPRESSION: 1. No acute abnormality. 2. Minimal chronic bronchitic changes and chronic interstitial lung disease. Electronically Signed   By: Claudie Revering M.D.   On: 04/19/2015 14:06   Dg Chest 2 View  04/17/2015  CLINICAL DATA:  Diagnosed with bronchitis on 04/14/2015, increased oxygen requirement EXAM: CHEST  2 VIEW COMPARISON:  01/15/2015 FINDINGS: Minimal enlargement of cardiac silhouette. Atherosclerotic calcification aorta. Mediastinal contours and pulmonary vascularity normal. Emphysematous and bronchitic changes consistent with COPD. Minimal bibasilar atelectasis greater on RIGHT. No acute infiltrate, pleural effusion or pneumothorax. Bones demineralized with chronic thoracic spine compression deformities at approximately T7 and T9. IMPRESSION: COPD changes with minimal bibasilar atelectasis greater on RIGHT. Electronically Signed   By: Lavonia Dana M.D.   On: 04/17/2015 17:17   Dg Thoracic Spine 2 View  04/17/2015  CLINICAL DATA:  [place] last night, bruising on back, diagnosed with bronchitis on 04/10, history COPD, hypertension EXAM: THORACIC SPINE 2 VIEWS COMPARISON:  Chest radiographs 01/15/2015 FINDINGS: Twelve pairs of ribs. Bones demineralized. Superior endplate compression deformities of 2 lower thoracic vertebra appear stable, likely T7 and T9. No new thoracic spine compression fractures identified. Visualized posterior ribs intact. Multilevel disc space narrowing and endplate spur formation. IMPRESSION: Osseous demineralization with degenerative disc disease changes thoracic spine. 2 stable thoracic compression deformities, likely T7 and T9.  No definite acute abnormalities.  Electronically Signed   By: Lavonia Dana M.D.   On: 04/17/2015 17:17   Current Outpatient Prescriptions on File Prior to Visit  Medication Sig Dispense Refill  . albuterol (PROVENTIL HFA;VENTOLIN HFA) 108 (90 Base) MCG/ACT inhaler Inhale 2 puffs into the lungs every 6 (six) hours as needed for wheezing or shortness of breath. 1 Inhaler 0  . amLODipine (NORVASC) 10 MG tablet Take 10 mg by mouth daily.    Marland Kitchen atorvastatin (LIPITOR) 40 MG tablet Take 40 mg by mouth daily.    . clopidogrel (PLAVIX) 75 MG tablet Take 75 mg by mouth daily.    . Fluticasone-Salmeterol (ADVAIR) 250-50 MCG/DOSE AEPB Inhale 1 puff into the lungs 2 (two) times daily.    . furosemide (LASIX) 40 MG tablet Take 40 mg by mouth daily.    Marland Kitchen gabapentin (NEURONTIN) 800 MG tablet Take 800 mg by mouth 2 (two) times daily.    Marland Kitchen levofloxacin (LEVAQUIN) 250 MG tablet Take two tablets on day one followed by one tablet on days 2-7 8 tablet 0  . levothyroxine (SYNTHROID, LEVOTHROID) 25 MCG tablet Take 25 mcg by mouth daily before breakfast.    . nitroGLYCERIN (NITROSTAT) 0.4 MG SL tablet Place 0.4 mg under the tongue every 5 (five) minutes as needed.      . predniSONE (DELTASONE) 20 MG tablet Take two tablets daily for 3 days followed by one tablet daily for 4 days 10 tablet 0  . ranitidine (ZANTAC) 300 MG tablet Take 300 mg by mouth at bedtime.    . sertraline (ZOLOFT) 100 MG tablet Take 100 mg by mouth daily.    Marland Kitchen tiotropium (SPIRIVA) 18 MCG inhalation capsule Place 18 mcg into inhaler and inhale daily.    . traZODone (DESYREL) 50 MG tablet Take 25-50 mg by mouth at bedtime as needed for sleep.    . vitamin B-12 (CYANOCOBALAMIN) 1000 MCG tablet Take 1,000 mcg by mouth daily.    . Vitamin D, Ergocalciferol, (DRISDOL) 50000 units CAPS capsule Take 50,000 Units by mouth every 7 (seven) days.     No current facility-administered medications on file prior to visit.    Allergies  Allergen Reactions  . Codeine Other (See Comments)    Coma  for 2 days  . Prednisone Other (See Comments)    Weight gain,puffy face  . Shellfish Allergy     Past Medical History  Diagnosis Date  . HTN (hypertension)   . COPD (chronic obstructive pulmonary disease) ([place])   . Depression   . Thyroid disease   . PMR (polymyalgia rheumatica) (HCC)     with h/o steroid induced myopathy  . Anxiety   . Diastolic dysfunction     Echo 05/11/2010, EF 65-70%  . TIA (transient ischemic attack)     11/05 s/p L CEA  . Kidney infection   . Carotid artery occlusion   . CAD (coronary artery disease)     h/o NSTEMI, LAD stent placement 10/04, cath 05/11/2010  . HLD (hyperlipidemia)   . Alzheimer disease     Past Surgical History  Procedure Laterality Date  . Cholecystectomy    . Carotid endarterectomy      left with stent placement 11/2003  . Iliac artery stent      right, 2001  . Cardiac catheterization      unc  . Cardiac catheterization    . Cardiac catheterization    . Lung biopsy      No family history on  file.  Social History   Social History  . Marital Status: Married    Spouse Name: N/A  . Number of Children: N/A  . Years of Education: N/A   Occupational History  . retired     Pharmacist, hospital   Social History Main Topics  . Smoking status: Former Smoker -- 1.00 packs/day for 25 years    Types: Cigarettes    Quit date: 01/05/1996  . Smokeless tobacco: Never Used  . Alcohol Use: No  . Drug Use: No  . Sexual Activity: Not on file   Other Topics Concern  . Not on file   Social History Narrative   Lives with husband, [name] at [place].   Daughters, [name] and [name] very involved.   The PMH, PSH, Social History, Family History, Medications, and allergies have been reviewed in [facility], and have been updated if relevant.    Review of Systems  Constitutional: Negative for fever.  HENT: Negative.   Respiratory: Positive for shortness of breath. Negative for cough and wheezing.   Cardiovascular: Negative.   Neurological: Positive  for weakness.       Objective:    BP 100/58 mmHg  Pulse 64  Temp(Src) 98.2 F (36.8 C) (Oral)  Wt   SpO2 97%   Physical Exam  Constitutional: She is oriented to person, place, and time. She appears well-developed and well-nourished. No distress.  Less pale today  HENT:  Head: Normocephalic.  Neck: Neck supple.  Pulmonary/Chest: Effort normal and breath sounds normal. No respiratory distress. She has no wheezes. She has no rales.  Musculoskeletal: Normal range of motion.  Neurological: She is alert and oriented to person, place, and time. No cranial nerve deficit.  Skin: Skin is warm and dry. She is not diaphoretic.  Psychiatric: She has a normal mood and affect. Her behavior is normal. Judgment and thought content normal.  Nursing note and vitals reviewed.         Assessment & Plan:   COPD exacerbation ([place])  Left lower lobe pneumonia No Follow-up on file.

## 2015-04-29 ENCOUNTER — Other Ambulatory Visit: Payer: Self-pay | Admitting: Family Medicine

## 2015-05-01 DIAGNOSIS — J449 Chronic obstructive pulmonary disease, unspecified: Secondary | ICD-10-CM | POA: Diagnosis not present

## 2015-05-01 DIAGNOSIS — J189 Pneumonia, unspecified organism: Secondary | ICD-10-CM | POA: Diagnosis not present

## 2015-05-01 DIAGNOSIS — J9611 Chronic respiratory failure with hypoxia: Secondary | ICD-10-CM | POA: Diagnosis not present

## 2015-05-03 ENCOUNTER — Other Ambulatory Visit: Payer: Self-pay | Admitting: Family Medicine

## 2015-05-05 ENCOUNTER — Ambulatory Visit (INDEPENDENT_AMBULATORY_CARE_PROVIDER_SITE_OTHER): Payer: Medicare Other | Admitting: Family Medicine

## 2015-05-05 VITALS — BP 120/58 | HR 69 | Temp 98.8°F | Ht 64.0 in | Wt 190.1 lb

## 2015-05-05 DIAGNOSIS — R600 Localized edema: Secondary | ICD-10-CM | POA: Insufficient documentation

## 2015-05-05 DIAGNOSIS — E079 Disorder of thyroid, unspecified: Secondary | ICD-10-CM

## 2015-05-05 DIAGNOSIS — I6523 Occlusion and stenosis of bilateral carotid arteries: Secondary | ICD-10-CM | POA: Diagnosis not present

## 2015-05-05 DIAGNOSIS — J189 Pneumonia, unspecified organism: Secondary | ICD-10-CM

## 2015-05-05 DIAGNOSIS — I959 Hypotension, unspecified: Secondary | ICD-10-CM | POA: Diagnosis not present

## 2015-05-05 DIAGNOSIS — J181 Lobar pneumonia, unspecified organism: Principal | ICD-10-CM

## 2015-05-05 LAB — TSH: TSH: 2.53 u[IU]/mL (ref 0.35–4.50)

## 2015-05-05 LAB — T3, FREE: T3, Free: 2.7 pg/mL (ref 2.3–4.2)

## 2015-05-05 LAB — T4, FREE: FREE T4: 0.81 ng/dL (ref 0.60–1.60)

## 2015-05-05 NOTE — Assessment & Plan Note (Signed)
Continue to hold norvasc.

## 2015-05-05 NOTE — Telephone Encounter (Signed)
Pt requesting refills, pt was here today.

## 2015-05-05 NOTE — Progress Notes (Signed)
Pre visit review using our clinic review tool, if applicable. No additional management support is needed unless otherwise documented below in the visit note. 

## 2015-05-05 NOTE — Patient Instructions (Signed)
Great to see you. Try taking a full lasix tablet every 2-3 days. Keep me updated. Please come back in 2 weeks for an xray.

## 2015-05-05 NOTE — Assessment & Plan Note (Signed)
Lung sounds reassuring on exam. Return in 2 weeks for repeat CXR.

## 2015-05-05 NOTE — Progress Notes (Signed)
Subjective:   Patient ID: Joy [name], female    DOB: Dec 27, 1930, 82 y.o.   MRN: [id number]  Joy [name] is a pleasant 82 y.o. year old female who presents to clinic today with her daughter, Joy [name], for Follow-up  on 05/05/2015  HPI:  Saw my partner, Dr. Darnell Level on 04/25/15.  Note reviewed.  She had been seen in ER twice prior and saw me as well as follow up.  Had been treated for COPD exacerbation with Zpack and course of steroids.  CXR was neg at [facility].  When she saw Dr. Darnell Level, she was having worsening weakness and increased O2 requirement.  Rhonchi and Rales on exam.  CXR was repeated in office which was concerning for LLL pneumonia.  Placed on levaquin and prednisone burst extended. Overall feels better.  O2 requirement is slowly improving.  Now having pedal edema- bilateral.  Still holding norvasc and decreased lasix to 20 mg daily due to hypotension.  Dg Chest 2 View  04/25/2015  CLINICAL DATA:  Worsening cough.  History of COPD. EXAM: CHEST  2 VIEW COMPARISON:  04/19/2015.  01/15/2015. FINDINGS: The heart size and mediastinal contours are stable. There is mild aortic atherosclerosis. The lungs are hyperinflated. There is chronic right basilar scarring. There is increased density projecting over the lower thoracic spine on the lateral view which appears to correspond with increased left lower lobe airspace disease. There is no significant pleural effusion. Thoracic spine degenerative changes and mild anterior wedging in the mid thoracic spine are stable. No acute osseous findings are seen. IMPRESSION: 1. Increased density projecting over the lower thoracic spine on the lateral view suspicious for left lower lobe pneumonia. 2. Followup PA and lateral chest X-ray is recommended in 3-4 weeks following trial of antibiotic therapy to ensure resolution and exclude underlying malignancy. Electronically Signed   By: Richardean Sale M.D.   On: 04/25/2015 17:00   Dg Chest 2 View  04/19/2015   CLINICAL DATA:  Worsening shortness of breath. Recently diagnosed with bronchitis. Ex-smoker. EXAM: CHEST  2 VIEW COMPARISON:  04/17/2015. FINDINGS: Normal sized heart. Clear lungs. Minimally prominent interstitial markings and central peribronchial thickening. Linear scar at the right lung base. Thoracic spine degenerative changes. Diffuse osteopenia. IMPRESSION: 1. No acute abnormality. 2. Minimal chronic bronchitic changes and chronic interstitial lung disease. Electronically Signed   By: Claudie Revering M.D.   On: 04/19/2015 14:06   Dg Chest 2 View  04/17/2015  CLINICAL DATA:  Diagnosed with bronchitis on 04/14/2015, increased oxygen requirement EXAM: CHEST  2 VIEW COMPARISON:  01/15/2015 FINDINGS: Minimal enlargement of cardiac silhouette. Atherosclerotic calcification aorta. Mediastinal contours and pulmonary vascularity normal. Emphysematous and bronchitic changes consistent with COPD. Minimal bibasilar atelectasis greater on RIGHT. No acute infiltrate, pleural effusion or pneumothorax. Bones demineralized with chronic thoracic spine compression deformities at approximately T7 and T9. IMPRESSION: COPD changes with minimal bibasilar atelectasis greater on RIGHT. Electronically Signed   By: Lavonia Dana M.D.   On: 04/17/2015 17:17   Dg Thoracic Spine 2 View  04/17/2015  CLINICAL DATA:  [place] last night, bruising on back, diagnosed with bronchitis on 04/10, history COPD, hypertension EXAM: THORACIC SPINE 2 VIEWS COMPARISON:  Chest radiographs 01/15/2015 FINDINGS: Twelve pairs of ribs. Bones demineralized. Superior endplate compression deformities of 2 lower thoracic vertebra appear stable, likely T7 and T9. No new thoracic spine compression fractures identified. Visualized posterior ribs intact. Multilevel disc space narrowing and endplate spur formation. IMPRESSION: Osseous demineralization with degenerative disc disease changes thoracic spine. 2  stable thoracic compression deformities, likely T7 and T9. No  definite acute abnormalities. Electronically Signed   By: Lavonia Dana M.D.   On: 04/17/2015 17:17   Current Outpatient Prescriptions on File Prior to Visit  Medication Sig Dispense Refill  . albuterol (PROVENTIL HFA;VENTOLIN HFA) 108 (90 Base) MCG/ACT inhaler Inhale 2 puffs into the lungs every 6 (six) hours as needed for wheezing or shortness of breath. 1 Inhaler 0  . albuterol (PROVENTIL) (2.5 MG/3ML) 0.083% nebulizer solution Take 3 mLs (2.5 mg total) by nebulization every 6 (six) hours as needed for wheezing or shortness of breath. 150 mL 1  . amLODipine (NORVASC) 10 MG tablet Take 10 mg by mouth daily.    Marland Kitchen atorvastatin (LIPITOR) 40 MG tablet TAKE 1 TABLET BY MOUTH EVERY DAY 30 tablet 1  . clopidogrel (PLAVIX) 75 MG tablet Take 75 mg by mouth daily.    . Fluticasone-Salmeterol (ADVAIR) 250-50 MCG/DOSE AEPB Inhale 1 puff into the lungs 2 (two) times daily.    . furosemide (LASIX) 40 MG tablet Take 40 mg by mouth daily.    Marland Kitchen gabapentin (NEURONTIN) 800 MG tablet Take 800 mg by mouth 2 (two) times daily.    Marland Kitchen levothyroxine (SYNTHROID, LEVOTHROID) 25 MCG tablet Take 25 mcg by mouth daily before breakfast.    . nitroGLYCERIN (NITROSTAT) 0.4 MG SL tablet Place 0.4 mg under the tongue every 5 (five) minutes as needed.      . ranitidine (ZANTAC) 300 MG tablet Take 300 mg by mouth at bedtime.    . sertraline (ZOLOFT) 100 MG tablet Take 100 mg by mouth daily.    Marland Kitchen tiotropium (SPIRIVA) 18 MCG inhalation capsule Place 18 mcg into inhaler and inhale daily.    . traZODone (DESYREL) 50 MG tablet Take 25-50 mg by mouth at bedtime as needed for sleep.    . vitamin B-12 (CYANOCOBALAMIN) 1000 MCG tablet Take 1,000 mcg by mouth daily.    . Vitamin D, Ergocalciferol, (DRISDOL) 50000 units CAPS capsule Take 50,000 Units by mouth every 7 (seven) days.     No current facility-administered medications on file prior to visit.    Allergies  Allergen Reactions  . Codeine Other (See Comments)    Coma for 2 days    . Prednisone Other (See Comments)    Weight gain,puffy face  . Shellfish Allergy     Past Medical History  Diagnosis Date  . HTN (hypertension)   . COPD (chronic obstructive pulmonary disease) ([place])   . Depression   . Thyroid disease   . PMR (polymyalgia rheumatica) ([facility])     with h/o steroid induced myopathy  . Anxiety   . Diastolic dysfunction     Echo 05/11/2010, EF 65-70%  . TIA (transient ischemic attack)     11/05 s/p L CEA  . Kidney infection   . Carotid artery occlusion   . CAD (coronary artery disease)     h/o NSTEMI, LAD stent placement 10/04, cath 05/11/2010  . HLD (hyperlipidemia)   . Alzheimer disease     Past Surgical History  Procedure Laterality Date  . Cholecystectomy    . Carotid endarterectomy      left with stent placement 11/2003  . Iliac artery stent      right, 2001  . Cardiac catheterization      unc  . Cardiac catheterization    . Cardiac catheterization    . Lung biopsy      No family history on file.  Social History  Social History  . Marital Status: Married    Spouse Name: N/A  . Number of Children: N/A  . Years of Education: N/A   Occupational History  . retired     Pharmacist, hospital   Social History Main Topics  . Smoking status: Former Smoker -- 1.00 packs/day for 25 years    Types: Cigarettes    Quit date: 01/05/1996  . Smokeless tobacco: Never Used  . Alcohol Use: No  . Drug Use: No  . Sexual Activity: Not on file   Other Topics Concern  . Not on file   Social History Narrative   Lives with husband, [name] at [place].   Daughters, [name] and [name] very involved.   The PMH, PSH, Social History, Family History, Medications, and allergies have been reviewed in [facility], and have been updated if relevant.    Review of Systems  Constitutional: Negative for fever.  HENT: Negative.   Respiratory: Positive for shortness of breath. Negative for cough and wheezing.   Cardiovascular: Positive for leg swelling.  Neurological:  Positive for weakness.       Objective:    BP 120/58 mmHg  Pulse 69  Temp(Src) 98.8 F (37.1 C) (Oral)  Ht 5\' 4"  (1.626 m)  Wt 190 lb 1.9 oz (86.238 kg)  BMI 32.62 kg/m2  SpO2 95%  Wt Readings from Last 3 Encounters:  05/05/15 190 lb 1.9 oz (86.238 kg)  04/25/15 197 lb 8 oz (89.585 kg)  04/19/15 195 lb (88.451 kg)     Physical Exam  Constitutional: She is oriented to person, place, and time. She appears well-developed and well-nourished. No distress.  Less pale today  HENT:  Head: Normocephalic.  Neck: Neck supple.  Pulmonary/Chest: Effort normal and breath sounds normal. No respiratory distress. She has no wheezes. She has no rales.  Musculoskeletal: She exhibits edema.  1+ pedal edema bilaterally  Neurological: She is alert and oriented to person, place, and time. No cranial nerve deficit.  Skin: Skin is warm and dry. She is not diaphoretic.  Psychiatric: She has a normal mood and affect. Her behavior is normal. Judgment and thought content normal.  Nursing note and vitals reviewed.         Assessment & Plan:   Thyroid disease  Left lower lobe pneumonia No Follow-up on file.

## 2015-05-05 NOTE — Assessment & Plan Note (Signed)
New- likely due to decreased Lasix dose. Advised ok to take a full lasix tablet (40 mg) every 2- 3 days and watch for signs and symptoms of hypotension/orthostasis. The patient and her daughter indicate understanding of these issues and agrees with the plan.

## 2015-05-06 ENCOUNTER — Ambulatory Visit: Payer: Medicare Other

## 2015-05-06 DIAGNOSIS — I6523 Occlusion and stenosis of bilateral carotid arteries: Secondary | ICD-10-CM | POA: Diagnosis not present

## 2015-05-07 ENCOUNTER — Other Ambulatory Visit: Payer: Self-pay

## 2015-05-07 DIAGNOSIS — I6523 Occlusion and stenosis of bilateral carotid arteries: Secondary | ICD-10-CM

## 2015-05-11 ENCOUNTER — Other Ambulatory Visit: Payer: Self-pay | Admitting: Family Medicine

## 2015-05-12 NOTE — Telephone Encounter (Signed)
Last refill 04/2014 #30 5R. Ok to fill per Dr Deborra Medina

## 2015-05-22 ENCOUNTER — Other Ambulatory Visit: Payer: Self-pay | Admitting: *Deleted

## 2015-05-22 ENCOUNTER — Other Ambulatory Visit: Payer: Self-pay | Admitting: Family Medicine

## 2015-05-22 MED ORDER — CLOPIDOGREL BISULFATE 75 MG PO TABS: 75.0000 mg | ORAL_TABLET | Freq: Every day | ORAL | Status: DC

## 2015-05-23 ENCOUNTER — Other Ambulatory Visit: Payer: Self-pay | Admitting: Family Medicine

## 2015-05-23 ENCOUNTER — Ambulatory Visit (INDEPENDENT_AMBULATORY_CARE_PROVIDER_SITE_OTHER)
Admission: RE | Admit: 2015-05-23 | Discharge: 2015-05-23 | Disposition: A | Discharge status: Home / Self Care | Payer: Medicare Other | Source: Ambulatory Visit | Attending: Family Medicine | Admitting: Family Medicine

## 2015-05-23 DIAGNOSIS — R0602 Shortness of breath: Secondary | ICD-10-CM | POA: Diagnosis not present

## 2015-05-26 ENCOUNTER — Other Ambulatory Visit: Payer: Self-pay | Admitting: Family Medicine

## 2015-06-02 ENCOUNTER — Other Ambulatory Visit: Payer: Self-pay | Admitting: Family Medicine

## 2015-06-06 ENCOUNTER — Emergency Department: Payer: Medicare Other

## 2015-06-06 ENCOUNTER — Emergency Department
Admission: EM | Admit: 2015-06-06 | Discharge: 2015-06-07 | Disposition: A | Discharge status: Home / Self Care | Payer: Medicare Other | Attending: Emergency Medicine | Admitting: Emergency Medicine

## 2015-06-06 DIAGNOSIS — E785 Hyperlipidemia, unspecified: Secondary | ICD-10-CM | POA: Diagnosis not present

## 2015-06-06 DIAGNOSIS — Z87891 Personal history of nicotine dependence: Secondary | ICD-10-CM | POA: Insufficient documentation

## 2015-06-06 DIAGNOSIS — S4992XA Unspecified injury of left shoulder and upper arm, initial encounter: Secondary | ICD-10-CM | POA: Diagnosis not present

## 2015-06-06 DIAGNOSIS — M25512 Pain in left shoulder: Secondary | ICD-10-CM | POA: Insufficient documentation

## 2015-06-06 DIAGNOSIS — Y9289 Other specified places as the place of occurrence of the external cause: Secondary | ICD-10-CM | POA: Diagnosis not present

## 2015-06-06 DIAGNOSIS — G309 Alzheimer's disease, unspecified: Secondary | ICD-10-CM | POA: Insufficient documentation

## 2015-06-06 DIAGNOSIS — Y939 Activity, unspecified: Secondary | ICD-10-CM | POA: Insufficient documentation

## 2015-06-06 DIAGNOSIS — I10 Essential (primary) hypertension: Secondary | ICD-10-CM | POA: Diagnosis not present

## 2015-06-06 DIAGNOSIS — J441 Chronic obstructive pulmonary disease with (acute) exacerbation: Secondary | ICD-10-CM | POA: Diagnosis not present

## 2015-06-06 DIAGNOSIS — Y999 Unspecified external cause status: Secondary | ICD-10-CM | POA: Diagnosis not present

## 2015-06-06 DIAGNOSIS — Z8673 Personal history of transient ischemic attack (TIA), and cerebral infarction without residual deficits: Secondary | ICD-10-CM | POA: Insufficient documentation

## 2015-06-06 DIAGNOSIS — W0110XA Fall on same level from slipping, tripping and stumbling with subsequent striking against unspecified object, initial encounter: Secondary | ICD-10-CM | POA: Insufficient documentation

## 2015-06-06 DIAGNOSIS — I2510 Atherosclerotic heart disease of native coronary artery without angina pectoris: Secondary | ICD-10-CM | POA: Diagnosis not present

## 2015-06-06 DIAGNOSIS — M25511 Pain in right shoulder: Secondary | ICD-10-CM | POA: Insufficient documentation

## 2015-06-06 DIAGNOSIS — Z79899 Other long term (current) drug therapy: Secondary | ICD-10-CM | POA: Diagnosis not present

## 2015-06-06 DIAGNOSIS — S4991XA Unspecified injury of right shoulder and upper arm, initial encounter: Secondary | ICD-10-CM | POA: Diagnosis not present

## 2015-06-06 DIAGNOSIS — F329 Major depressive disorder, single episode, unspecified: Secondary | ICD-10-CM | POA: Diagnosis not present

## 2015-06-06 DIAGNOSIS — I5032 Chronic diastolic (congestive) heart failure: Secondary | ICD-10-CM | POA: Diagnosis not present

## 2015-06-06 DIAGNOSIS — S299XXA Unspecified injury of thorax, initial encounter: Secondary | ICD-10-CM | POA: Diagnosis not present

## 2015-06-06 DIAGNOSIS — I6529 Occlusion and stenosis of unspecified carotid artery: Secondary | ICD-10-CM | POA: Diagnosis not present

## 2015-06-06 DIAGNOSIS — N183 Chronic kidney disease, stage 3 (moderate): Secondary | ICD-10-CM | POA: Diagnosis not present

## 2015-06-06 DIAGNOSIS — I130 Hypertensive heart and chronic kidney disease with heart failure and stage 1 through stage 4 chronic kidney disease, or unspecified chronic kidney disease: Secondary | ICD-10-CM | POA: Diagnosis not present

## 2015-06-06 DIAGNOSIS — M546 Pain in thoracic spine: Secondary | ICD-10-CM | POA: Diagnosis not present

## 2015-06-06 DIAGNOSIS — M542 Cervicalgia: Secondary | ICD-10-CM | POA: Diagnosis not present

## 2015-06-06 DIAGNOSIS — S199XXA Unspecified injury of neck, initial encounter: Secondary | ICD-10-CM | POA: Diagnosis not present

## 2015-06-06 DIAGNOSIS — S0990XA Unspecified injury of head, initial encounter: Secondary | ICD-10-CM | POA: Diagnosis not present

## 2015-06-06 DIAGNOSIS — W19XXXA Unspecified fall, initial encounter: Secondary | ICD-10-CM

## 2015-06-06 DIAGNOSIS — M549 Dorsalgia, unspecified: Secondary | ICD-10-CM | POA: Diagnosis present

## 2015-06-06 LAB — CBC WITH DIFFERENTIAL/PLATELET
Basophils Absolute: 0 10*3/uL (ref 0–0.1)
EOS ABS: 0.1 10*3/uL (ref 0–0.7)
HCT: 32.3 % — ABNORMAL LOW (ref 35.0–47.0)
HEMOGLOBIN: 10.8 g/dL — AB (ref 12.0–16.0)
LYMPHS ABS: 0.8 10*3/uL — AB (ref 1.0–3.6)
Lymphocytes Relative: 9 %
MCH: 29.7 pg (ref 26.0–34.0)
MCHC: 33.3 g/dL (ref 32.0–36.0)
MCV: 89.3 fL (ref 80.0–100.0)
Monocytes Absolute: 0.7 10*3/uL (ref 0.2–0.9)
Monocytes Relative: 9 %
Neutro Abs: 6.7 10*3/uL — ABNORMAL HIGH (ref 1.4–6.5)
PLATELETS: 168 10*3/uL (ref 150–440)
RBC: 3.62 MIL/uL — AB (ref 3.80–5.20)
RDW: 16.1 % — ABNORMAL HIGH (ref 11.5–14.5)
WBC: 8.4 10*3/uL (ref 3.6–11.0)

## 2015-06-06 LAB — COMPREHENSIVE METABOLIC PANEL
ALBUMIN: 3.7 g/dL (ref 3.5–5.0)
ALT: 8 U/L — AB (ref 14–54)
ANION GAP: 10 (ref 5–15)
AST: 23 U/L (ref 15–41)
Alkaline Phosphatase: 107 U/L (ref 38–126)
BUN: 16 mg/dL (ref 6–20)
CHLORIDE: 103 mmol/L (ref 101–111)
CO2: 30 mmol/L (ref 22–32)
Calcium: 8.5 mg/dL — ABNORMAL LOW (ref 8.9–10.3)
Creatinine, Ser: 1.19 mg/dL — ABNORMAL HIGH (ref 0.44–1.00)
GFR calc non Af Amer: 41 mL/min — ABNORMAL LOW (ref >60)
GFR, EST AFRICAN AMERICAN: 48 mL/min — AB (ref >60)
GLUCOSE: 109 mg/dL — AB (ref 65–99)
Potassium: 3.8 mmol/L (ref 3.5–5.1)
SODIUM: 143 mmol/L (ref 135–145)
Total Bilirubin: 0.8 mg/dL (ref 0.3–1.2)
Total Protein: 6 g/dL — ABNORMAL LOW (ref 6.5–8.1)

## 2015-06-06 NOTE — ED Provider Notes (Signed)
[facility] Emergency Department Provider Note  ____________________________________________  Time seen: 11:15 PM  I have reviewed the triage vital signs and the nursing notes.   HISTORY  Chief Complaint Fall     HPI Joy [name] is a 82 y.o. female with history of dementia presents status post fall today. Patient states that she went to the bathroom without using her walker and had an episode of diarrhea which she slipped on falling onto her right side. Patient admits to upper back pain right shoulder pain. Patient admits to hitting her head however no LOC. Patient denies any neck pain. Current pain score is described as mild     Past Medical History  Diagnosis Date  . HTN (hypertension)   . COPD (chronic obstructive pulmonary disease) ([place])   . Depression   . Thyroid disease   . PMR (polymyalgia rheumatica) ([facility])     with h/o steroid induced myopathy  . Anxiety   . Diastolic dysfunction     Echo 05/11/2010, EF 65-70%  . TIA (transient ischemic attack)     11/05 s/p L CEA  . Kidney infection   . Carotid artery occlusion   . CAD (coronary artery disease)     h/o NSTEMI, LAD stent placement 10/04, cath 05/11/2010  . HLD (hyperlipidemia)   . Alzheimer disease     Patient Active Problem List   Diagnosis Date Noted  . Hypotension 05/05/2015  . Pedal edema 05/05/2015  . COPD exacerbation ([place]) 04/25/2015  . Left lower lobe pneumonia 04/25/2015  . Chest pain 02/13/2015  . Allergic rhinitis 11/04/2014  . Chronic diastolic heart failure ([place]) 07/22/2014  . CKD (chronic kidney disease) stage 3, GFR 30-59 ml/min 06/11/2014  . Carotid artery disease ([place]) 04/08/2014  . Nevus of cheek 10/30/2013  . CAD (coronary artery disease)   . HLD (hyperlipidemia)   . Lung nodule 01/15/2013  . Memory loss 01/15/2013  . Weakness generalized 12/26/2012  . Incontinence 12/26/2012  . GERD (gastroesophageal reflux disease) 09/06/2012  . Neuropathy ([place])  01/14/2011  . Insomnia 01/14/2011  . MELAS (mitochondrial encephalopathy, lactic acidosis and stroke-like episodes) ([place]) 11/02/2010  . HTN (hypertension)   . COPD (chronic obstructive pulmonary disease) ([place])   . Depression   . Thyroid disease     Past Surgical History  Procedure Laterality Date  . Carotid endarterectomy      left with stent placement 11/2003  . Iliac artery stent      right, 2001  . Cardiac catheterization      unc  . Cardiac catheterization    . Cardiac catheterization    . Lung biopsy      Current Outpatient Rx  Name  Route  Sig  Dispense  Refill  . albuterol (PROVENTIL HFA;VENTOLIN HFA) 108 (90 Base) MCG/ACT inhaler   Inhalation   Inhale 2 puffs into the lungs every 6 (six) hours as needed for wheezing or shortness of breath.   1 Inhaler   0   . albuterol (PROVENTIL) (2.5 MG/3ML) 0.083% nebulizer solution   Nebulization   Take 3 mLs (2.5 mg total) by nebulization every 6 (six) hours as needed for wheezing or shortness of breath.   150 mL   1   . amLODipine (NORVASC) 10 MG tablet   Oral   Take 10 mg by mouth daily.         Marland Kitchen atorvastatin (LIPITOR) 40 MG tablet      TAKE 1 TABLET BY MOUTH EVERY DAY   30  tablet   1   . clopidogrel (PLAVIX) 75 MG tablet   Oral   Take 1 tablet (75 mg total) by mouth daily.   90 tablet   1   . Fluticasone-Salmeterol (ADVAIR) 250-50 MCG/DOSE AEPB   Inhalation   Inhale 1 puff into the lungs 2 (two) times daily.         . furosemide (LASIX) 40 MG tablet   Oral   Take 40 mg by mouth daily.         Marland Kitchen gabapentin (NEURONTIN) 800 MG tablet   Oral   Take 800 mg by mouth 2 (two) times daily.         Marland Kitchen gabapentin (NEURONTIN) 800 MG tablet      TAKE 1 TABLET BY MOUTH TWICE A DAY   60 tablet   5     30 DAYS FOR INS PLEASE   . levothyroxine (SYNTHROID, LEVOTHROID) 25 MCG tablet   Oral   Take 25 mcg by mouth daily before breakfast.         . levothyroxine (SYNTHROID, LEVOTHROID) 25 MCG tablet       TAKE 1 TABLET BY MOUTH EVERY DAY   90 tablet   1     90 DAYS FOR INS PLEASE   . nitroGLYCERIN (NITROSTAT) 0.4 MG SL tablet   Sublingual   Place 0.4 mg under the tongue every 5 (five) minutes as needed.           . ranitidine (ZANTAC) 300 MG tablet      TAKE 1 TABLET (300 MG TOTAL) BY MOUTH AT BEDTIME.   90 tablet   2   . sertraline (ZOLOFT) 100 MG tablet   Oral   Take 100 mg by mouth daily.         . sertraline (ZOLOFT) 100 MG tablet      TAKE 1 TABLET BY MOUTH EVERY DAY   30 tablet   2   . SPIRIVA HANDIHALER 18 MCG inhalation capsule      PLACE 1 CAPSULE (18 MCG TOTAL) INTO INHALER AND INHALE DAILY.   30 capsule   10   . traZODone (DESYREL) 50 MG tablet   Oral   Take 25-50 mg by mouth at bedtime as needed for sleep.         . traZODone (DESYREL) 50 MG tablet      TAKE 1/2 TO 1 TABLET AT BEDTIME AS NEEDED FOR SLEEP   30 tablet   0   . vitamin B-12 (CYANOCOBALAMIN) 1000 MCG tablet   Oral   Take 1,000 mcg by mouth daily.         . Vitamin D, Ergocalciferol, (DRISDOL) 50000 units CAPS capsule   Oral   Take 50,000 Units by mouth every 7 (seven) days.           Allergies Codeine; Prednisone; and Shellfish allergy  No family history on file.  Social History Social History  Substance Use Topics  . Smoking status: Former Smoker -- 1.00 packs/day for 25 years    Types: Cigarettes    Quit date: 01/05/1996  . Smokeless tobacco: Never Used  . Alcohol Use: No    Review of Systems  Constitutional: Negative for fever. Eyes: Negative for visual changes. ENT: Negative for sore throat. Cardiovascular: Negative for chest pain. Respiratory: Negative for shortness of breath. Gastrointestinal: Negative for abdominal pain, vomiting and diarrhea. Genitourinary: Negative for dysuria. Musculoskeletal: Positive bilateral shoulder pain, back pain Skin: Negative for rash.  Neurological:  Negative for headaches, focal weakness or numbness.   10-point  ROS otherwise negative.  ____________________________________________   PHYSICAL EXAM:  VITAL SIGNS: ED Triage Vitals  Enc Vitals Group     BP 06/06/15 2205 185/54 mmHg     Pulse Rate 06/06/15 2205 70     Resp 06/06/15 2205 12     Temp 06/06/15 2205 98.3 F (36.8 C)     Temp Source 06/06/15 2205 Oral     SpO2 06/06/15 2152 100 %     Weight 06/06/15 2205 195 lb (88.451 kg)     Height 06/06/15 2205 5\' 4"  (1.626 m)     Head Cir --      Peak Flow --      Pain Score 06/06/15 2208 8     Pain Loc --      Pain Edu? --      Excl. in [place]? --      Constitutional: Alert and oriented. Well appearing and in no distress. Eyes: Conjunctivae are normal. PERRL. Normal extraocular movements. ENT   Head: Normocephalic and atraumatic.   Nose: No congestion/rhinnorhea.   Mouth/Throat: Mucous membranes are moist.   Neck: No stridor. Hematological/Lymphatic/Immunilogical: No cervical lymphadenopathy. Cardiovascular: Normal rate, regular rhythm. Normal and symmetric distal pulses are present in all extremities. No murmurs, rubs, or gallops. Respiratory: Normal respiratory effort without tachypnea nor retractions. Breath sounds are clear and equal bilaterally. No wheezes/rales/rhonchi. Gastrointestinal: Soft and nontender. No distention. There is no CVA tenderness. Genitourinary: deferred Musculoskeletal: Nontender with normal range of motion in all extremities. No joint effusions.  No lower extremity tenderness nor edema.T5-7 spinous process tenderness to palpation. Neurologic:  Normal speech and language. No gross focal neurologic deficits are appreciated. Speech is normal.  Skin:  Skin is warm, dry and intact. No rash noted. Psychiatric: Mood and affect are normal. Speech and behavior are normal. Patient exhibits appropriate insight and judgment.  ____________________________________________    LABS (pertinent positives/negatives)  Labs Reviewed  CBC WITH DIFFERENTIAL/PLATELET  - Abnormal; Notable for the following:    RBC 3.62 (*)    Hemoglobin 10.8 (*)    HCT 32.3 (*)    RDW 16.1 (*)    Neutro Abs 6.7 (*)    Lymphs Abs 0.8 (*)    All other components within normal limits  COMPREHENSIVE METABOLIC PANEL - Abnormal; Notable for the following:    Glucose, Bld 109 (*)    Creatinine, Ser 1.19 (*)    Calcium 8.5 (*)    Total Protein 6.0 (*)    ALT 8 (*)    GFR calc non Af Amer 41 (*)    GFR calc Af Amer 48 (*)    All other components within normal limits  URINE CULTURE  C DIFFICILE QUICK SCREEN W PCR REFLEX  GASTROINTESTINAL PANEL BY PCR, STOOL (REPLACES STOOL CULTURE)  URINALYSIS COMPLETEWITH MICROSCOPIC ([facility] ONLY)     ____________________________________________   EKG  ED ECG REPORT I, Perrinton N BROWN, the attending physician, personally viewed and interpreted this ECG.   Date: 06/07/2015  EKG Time: 10:02 PM  Rate: 73  Rhythm: Normal sinus rhythm  Axis: Normal  Intervals: Normal  ST&T Change: None   ____________________________________________    RADIOLOGY  DG Thoracic Spine 2 View (Final result) Result time: 06/07/15 00:05:35   Final result by Rad Results In Interface (06/07/15 00:05:35)   Narrative:   CLINICAL DATA: Acute onset of upper back pain after fall. Initial encounter.  EXAM: THORACIC SPINE 2 VIEWS  COMPARISON:  Chest radiograph performed 05/23/2015  FINDINGS: There is no evidence of fracture or subluxation. Mild chronic compression deformities are noted, one at the level of the upper thoracic spine, and two at the level of the mid thoracic spine. These appear grossly stable from the prior study. Vertebral bodies demonstrate normal alignment. Intervertebral disc spaces are preserved.  The visualized portions of both lungs are clear. The mediastinum is unremarkable in appearance.  IMPRESSION: 1. No evidence of acute fracture or subluxation along the thoracic spine. 2. Mild chronic compression deformities  are stable in appearance.   Electronically Signed By: Garald Balding M.D. On: 06/07/2015 00:05          DG Shoulder Right (Final result) Result time: 06/06/15 23:26:01   Final result by Rad Results In Interface (06/06/15 23:26:01)   Narrative:   CLINICAL DATA: Fall this morning. Right shoulder injury and pain. Initial encounter.  EXAM: RIGHT SHOULDER - 2+ VIEW  COMPARISON: None.  FINDINGS: There is no evidence of fracture or dislocation. There is no evidence of arthropathy or other focal bone abnormality. Soft tissues are unremarkable.  IMPRESSION: Negative.   Electronically Signed By: Earle Gell M.D. On: 06/06/2015 23:26          DG Shoulder Left (Final result) Result time: 06/06/15 23:12:54   Final result by Rad Results In Interface (06/06/15 23:12:54)   Narrative:   CLINICAL DATA: Initial valuation for acute on chronic left shoulder pain status post fall.  EXAM: LEFT SHOULDER - 2+ VIEW  COMPARISON: None.  FINDINGS: No acute fracture or dislocation. Humeral head in normal alignment with the glenoid. AC joint approximated. Degenerative osteoarthritic changes present about the [facility] joint, and to a lesser extent the inferior aspect of the left glenohumeral joint. No periarticular calcification. No acute soft tissue abnormality. Partially visualized left hemi thorax unremarkable.  IMPRESSION: 1. No acute fracture or dislocation. 2. Degenerative osteoarthrosis about the left AC and glenohumeral joints.   Electronically Signed By: Jeannine Boga M.D. On: 06/06/2015 23:12          CT Head Wo Contrast (Final result) Result time: 06/06/15 23:10:13   Final result by Rad Results In Interface (06/06/15 23:10:13)   Narrative:   CLINICAL DATA: Status post fall in bathroom. Hit back of head. Neck and left shoulder pain. Patient on Plavix. Initial encounter.  EXAM: CT HEAD WITHOUT CONTRAST  CT CERVICAL SPINE WITHOUT  CONTRAST  TECHNIQUE: Multidetector CT imaging of the head and cervical spine was performed following the standard protocol without intravenous contrast. Multiplanar CT image reconstructions of the cervical spine were also generated.  COMPARISON: CT of the head performed 03/10/2015, and MRI of the brain performed 08/20/2012  FINDINGS: CT HEAD FINDINGS  There is no evidence of acute infarction, mass lesion, or intra- or extra-axial hemorrhage on CT.  Prominence of the ventricles and sulci reflects mild cortical volume loss. Mild periventricular white matter change likely reflects small vessel ischemic microangiopathy. Mild cerebellar atrophy is noted.  The brainstem and fourth ventricle are within normal limits. The basal ganglia are unremarkable in appearance. The cerebral hemispheres demonstrate grossly normal gray-white differentiation. No mass effect or midline shift is seen.  There is no evidence of fracture; visualized osseous structures are unremarkable in appearance. The orbits are within normal limits. The paranasal sinuses and mastoid air cells are well-aerated. No significant soft tissue abnormalities are seen.  CT CERVICAL SPINE FINDINGS  There is no evidence of fracture or subluxation. Vertebral bodies demonstrate normal height and alignment. Mild disc space  narrowing is noted at C6-C7, with small anterior and posterior disc osteophyte complexes. Underlying facet disease is noted along the cervical spine. Prevertebral soft tissues are within normal limits.  The thyroid gland is unremarkable in appearance. Emphysematous change is noted at the lung apices. Mild calcification is seen at the carotid bifurcations bilaterally.  IMPRESSION: 1. No evidence of traumatic intracranial injury or fracture. 2. No evidence of fracture or subluxation along the cervical spine. 3. Mild cortical volume loss and scattered small vessel ischemic microangiopathy. 4. Mild  degenerative change along the cervical spine. 5. Emphysematous change at the lung apices. 6. Mild calcification at the carotid bifurcations bilaterally. Carotid ultrasound could be considered for further evaluation, when and as deemed clinically appropriate.   Electronically Signed By: Garald Balding M.D. On: 06/06/2015 23:10          CT Cervical Spine Wo Contrast (Final result) Result time: 06/06/15 23:10:13   Final result by Rad Results In Interface (06/06/15 23:10:13)   Narrative:   CLINICAL DATA: Status post fall in bathroom. Hit back of head. Neck and left shoulder pain. Patient on Plavix. Initial encounter.  EXAM: CT HEAD WITHOUT CONTRAST  CT CERVICAL SPINE WITHOUT CONTRAST  TECHNIQUE: Multidetector CT imaging of the head and cervical spine was performed following the standard protocol without intravenous contrast. Multiplanar CT image reconstructions of the cervical spine were also generated.  COMPARISON: CT of the head performed 03/10/2015, and MRI of the brain performed 08/20/2012  FINDINGS: CT HEAD FINDINGS  There is no evidence of acute infarction, mass lesion, or intra- or extra-axial hemorrhage on CT.  Prominence of the ventricles and sulci reflects mild cortical volume loss. Mild periventricular white matter change likely reflects small vessel ischemic microangiopathy. Mild cerebellar atrophy is noted.  The brainstem and fourth ventricle are within normal limits. The basal ganglia are unremarkable in appearance. The cerebral hemispheres demonstrate grossly normal gray-white differentiation. No mass effect or midline shift is seen.  There is no evidence of fracture; visualized osseous structures are unremarkable in appearance. The orbits are within normal limits. The paranasal sinuses and mastoid air cells are well-aerated. No significant soft tissue abnormalities are seen.  CT CERVICAL SPINE FINDINGS  There is no evidence of fracture or  subluxation. Vertebral bodies demonstrate normal height and alignment. Mild disc space narrowing is noted at C6-C7, with small anterior and posterior disc osteophyte complexes. Underlying facet disease is noted along the cervical spine. Prevertebral soft tissues are within normal limits.  The thyroid gland is unremarkable in appearance. Emphysematous change is noted at the lung apices. Mild calcification is seen at the carotid bifurcations bilaterally.  IMPRESSION: 1. No evidence of traumatic intracranial injury or fracture. 2. No evidence of fracture or subluxation along the cervical spine. 3. Mild cortical volume loss and scattered small vessel ischemic microangiopathy. 4. Mild degenerative change along the cervical spine. 5. Emphysematous change at the lung apices. 6. Mild calcification at the carotid bifurcations bilaterally. Carotid ultrasound could be considered for further evaluation, when and as deemed clinically appropriate.   Electronically Signed By: Garald Balding M.D. On: 06/06/2015 23:10      INITIAL IMPRESSION / ASSESSMENT AND PLAN / ED COURSE  Pertinent labs & imaging results that were available during my care of the patient were reviewed by me and considered in my medical decision making (see chart for details).  History of physical exam consistent with possible mechanical fall without any acute injury other than contusions.   ____________________________________________   FINAL CLINICAL IMPRESSION(S) /  ED DIAGNOSES  Final diagnoses:  Vertell Novak, MD 06/07/15 [phone]

## 2015-06-06 NOTE — ED Notes (Signed)
Per EMS: pt from [facility] independent living. Pt fell in bathroom on diarrhea. Pt hit back of head. Pt denies LOC, tenderness. Pt denies neck pain. Pt on Plavix. Pt reports chronic left shoulder pain that has increased   Per pt: Pt reports neck pain, and left shoulder pain. Pt reports she often has intermittent diarrhea.

## 2015-06-06 NOTE — ED Notes (Signed)
Patient transported to CT 

## 2015-06-06 NOTE — ED Notes (Signed)
Per patient's daughter. At home pt reported bilateral shoulder pain, and pt had stool smeared over right shoulder.Daughter reports while she was cleaning pt, pt was tender to right shoulder. Daughter believes due to location of stool,and pain pt hit right shoulder in fall.

## 2015-06-06 NOTE — ED Notes (Signed)
MD Brown at bedside.

## 2015-06-07 DIAGNOSIS — M25511 Pain in right shoulder: Secondary | ICD-10-CM | POA: Diagnosis not present

## 2015-06-07 DIAGNOSIS — M546 Pain in thoracic spine: Secondary | ICD-10-CM | POA: Diagnosis not present

## 2015-06-07 DIAGNOSIS — S299XXA Unspecified injury of thorax, initial encounter: Secondary | ICD-10-CM | POA: Diagnosis not present

## 2015-06-07 LAB — URINALYSIS COMPLETE WITH MICROSCOPIC (ARMC ONLY)
BILIRUBIN URINE: NEGATIVE
Bacteria, UA: NONE SEEN
GLUCOSE, UA: NEGATIVE mg/dL
KETONES UR: NEGATIVE mg/dL
Leukocytes, UA: NEGATIVE
Nitrite: NEGATIVE
PH: 6 (ref 5.0–8.0)
Protein, ur: NEGATIVE mg/dL
Specific Gravity, Urine: 1.011 (ref 1.005–1.030)

## 2015-06-07 NOTE — ED Notes (Signed)
Reviewed d/c paperwork and follow-up care with pt. Pt verbalized understanding

## 2015-06-09 LAB — URINE CULTURE: SPECIAL REQUESTS: NORMAL

## 2015-06-11 ENCOUNTER — Other Ambulatory Visit: Payer: Self-pay | Admitting: Family Medicine

## 2015-06-26 ENCOUNTER — Ambulatory Visit
Admission: RE | Admit: 2015-06-26 | Discharge: 2015-06-26 | Disposition: A | Discharge status: Home / Self Care | Payer: Medicare Other | Source: Ambulatory Visit | Attending: Internal Medicine | Admitting: Internal Medicine

## 2015-06-26 ENCOUNTER — Other Ambulatory Visit: Payer: Self-pay | Admitting: Internal Medicine

## 2015-06-26 DIAGNOSIS — J984 Other disorders of lung: Secondary | ICD-10-CM | POA: Diagnosis not present

## 2015-06-26 DIAGNOSIS — J9611 Chronic respiratory failure with hypoxia: Secondary | ICD-10-CM | POA: Diagnosis not present

## 2015-06-26 DIAGNOSIS — J690 Pneumonitis due to inhalation of food and vomit: Secondary | ICD-10-CM

## 2015-06-26 DIAGNOSIS — J189 Pneumonia, unspecified organism: Secondary | ICD-10-CM | POA: Diagnosis not present

## 2015-06-26 DIAGNOSIS — J449 Chronic obstructive pulmonary disease, unspecified: Secondary | ICD-10-CM | POA: Diagnosis not present

## 2015-06-26 DIAGNOSIS — R05 Cough: Secondary | ICD-10-CM | POA: Diagnosis not present

## 2015-07-01 ENCOUNTER — Other Ambulatory Visit: Payer: Self-pay | Admitting: Family Medicine

## 2015-07-01 NOTE — Telephone Encounter (Signed)
Last labs 12/2014-abnormal. pls advise

## 2015-07-09 ENCOUNTER — Other Ambulatory Visit: Payer: Self-pay | Admitting: Family Medicine

## 2015-07-10 ENCOUNTER — Encounter: Payer: Self-pay | Admitting: Family Medicine

## 2015-07-10 ENCOUNTER — Ambulatory Visit (INDEPENDENT_AMBULATORY_CARE_PROVIDER_SITE_OTHER): Payer: Medicare Other | Admitting: Family Medicine

## 2015-07-10 VITALS — BP 138/62 | HR 74 | Temp 97.9°F | Wt 200.0 lb

## 2015-07-10 DIAGNOSIS — I6523 Occlusion and stenosis of bilateral carotid arteries: Secondary | ICD-10-CM | POA: Diagnosis not present

## 2015-07-10 DIAGNOSIS — R350 Frequency of micturition: Secondary | ICD-10-CM

## 2015-07-10 DIAGNOSIS — R413 Other amnesia: Secondary | ICD-10-CM | POA: Diagnosis not present

## 2015-07-10 LAB — POC URINALSYSI DIPSTICK (AUTOMATED)
Bilirubin, UA: NEGATIVE
Glucose, UA: NEGATIVE
KETONES UA: NEGATIVE
LEUKOCYTES UA: NEGATIVE
NITRITE UA: NEGATIVE
PH UA: 5.5
PROTEIN UA: NEGATIVE
Spec Grav, UA: >=1.03
UROBILINOGEN UA: 0.2

## 2015-07-10 NOTE — Progress Notes (Signed)
SUBJECTIVE: Joy [name] is a 82 y.o. female who complains of urinary frequency, urgency and strong odor to her urine x 2 days, without flank pain, fever, chills, or abnormal vaginal discharge or bleeding.   Current Outpatient Prescriptions on File Prior to Visit  Medication Sig Dispense Refill  . ADVAIR DISKUS 250-50 MCG/DOSE AEPB INHALE 1 PUFF BY MOUTH TWICE A DAY 60 each 0  . albuterol (PROVENTIL HFA;VENTOLIN HFA) 108 (90 Base) MCG/ACT inhaler Inhale 2 puffs into the lungs every 6 (six) hours as needed for wheezing or shortness of breath. 1 Inhaler 0  . albuterol (PROVENTIL) (2.5 MG/3ML) 0.083% nebulizer solution Take 3 mLs (2.5 mg total) by nebulization every 6 (six) hours as needed for wheezing or shortness of breath. 150 mL 1  . amLODipine (NORVASC) 10 MG tablet Take 10 mg by mouth daily.    Marland Kitchen atorvastatin (LIPITOR) 40 MG tablet TAKE 1 TABLET BY MOUTH EVERY DAY 30 tablet 1  . clopidogrel (PLAVIX) 75 MG tablet Take 1 tablet (75 mg total) by mouth daily. 90 tablet 1  . furosemide (LASIX) 40 MG tablet Take 40 mg by mouth daily.    Marland Kitchen gabapentin (NEURONTIN) 800 MG tablet Take 800 mg by mouth 2 (two) times daily.    Marland Kitchen gabapentin (NEURONTIN) 800 MG tablet TAKE 1 TABLET BY MOUTH TWICE A DAY 60 tablet 5  . levothyroxine (SYNTHROID, LEVOTHROID) 25 MCG tablet Take 25 mcg by mouth daily before breakfast.    . levothyroxine (SYNTHROID, LEVOTHROID) 25 MCG tablet TAKE 1 TABLET BY MOUTH EVERY DAY 90 tablet 1  . nitroGLYCERIN (NITROSTAT) 0.4 MG SL tablet Place 0.4 mg under the tongue every 5 (five) minutes as needed.      . ranitidine (ZANTAC) 300 MG tablet TAKE 1 TABLET (300 MG TOTAL) BY MOUTH AT BEDTIME. 90 tablet 2  . sertraline (ZOLOFT) 100 MG tablet TAKE 1 TABLET BY MOUTH EVERY DAY 30 tablet 2  . SPIRIVA HANDIHALER 18 MCG inhalation capsule PLACE 1 CAPSULE (18 MCG TOTAL) INTO INHALER AND INHALE DAILY. 30 capsule 10  . traZODone (DESYREL) 50 MG tablet TAKE 1/2 TO 1 TABLET AT BEDTIME AS NEEDED FOR  SLEEP 30 tablet 0  . vitamin B-12 (CYANOCOBALAMIN) 1000 MCG tablet Take 1,000 mcg by mouth daily.    . Vitamin D, Ergocalciferol, (DRISDOL) 50000 units CAPS capsule Take 50,000 Units by mouth every 7 (seven) days.     No current facility-administered medications on file prior to visit.    Allergies  Allergen Reactions  . Codeine Other (See Comments)    Coma for 2 days  . Prednisone Other (See Comments)    Weight gain,puffy face  . Shellfish Allergy     Past Medical History  Diagnosis Date  . HTN (hypertension)   . COPD (chronic obstructive pulmonary disease) ([place])   . Depression   . Thyroid disease   . PMR (polymyalgia rheumatica) ([facility])     with h/o steroid induced myopathy  . Anxiety   . Diastolic dysfunction     Echo 05/11/2010, EF 65-70%  . TIA (transient ischemic attack)     11/05 s/p L CEA  . Kidney infection   . Carotid artery occlusion   . CAD (coronary artery disease)     h/o NSTEMI, LAD stent placement 10/04, cath 05/11/2010  . HLD (hyperlipidemia)   . Alzheimer disease     Past Surgical History  Procedure Laterality Date  . Carotid endarterectomy      left with stent placement 11/2003  .  Iliac artery stent      right, 2001  . Cardiac catheterization      unc  . Cardiac catheterization    . Cardiac catheterization    . Lung biopsy      No family history on file.  Social History   Social History  . Marital Status: Married    Spouse Name: N/A  . Number of Children: N/A  . Years of Education: N/A   Occupational History  . retired     Pharmacist, hospital   Social History Main Topics  . Smoking status: Former Smoker -- 1.00 packs/day for 25 years    Types: Cigarettes    Quit date: 01/05/1996  . Smokeless tobacco: Never Used  . Alcohol Use: No  . Drug Use: No  . Sexual Activity: Not on file   Other Topics Concern  . Not on file   Social History Narrative   Lives with husband, [name] at [place].   Daughters, [name] and [name] very involved.   The  PMH, PSH, Social History, Family History, Medications, and allergies have been reviewed in [facility], and have been updated if relevant.  OBJECTIVE:  BP 138/62 mmHg  Pulse 74  Temp(Src) 97.9 F (36.6 C) (Oral)  Wt 200 lb (90.719 kg)  SpO2 92% Appears well, in no apparent distress.  Vital signs are normal. The abdomen is soft without tenderness, guarding, mass, rebound or organomegaly. No CVA tenderness or inguinal adenopathy noted. Urine dipstick shows negative for all components.   ASSESSMENT: bladders spasms  PLAN: Reassurance provided.  Drink more water. Call or return to clinic prn if these symptoms worsen or fail to improve as anticipated.

## 2015-07-10 NOTE — Progress Notes (Signed)
Pre visit review using our clinic review tool, if applicable. No additional management support is needed unless otherwise documented below in the visit note. 

## 2015-07-15 ENCOUNTER — Ambulatory Visit (INDEPENDENT_AMBULATORY_CARE_PROVIDER_SITE_OTHER): Payer: Medicare Other | Admitting: Cardiovascular Disease

## 2015-07-15 ENCOUNTER — Encounter: Payer: Self-pay | Admitting: Cardiovascular Disease

## 2015-07-15 ENCOUNTER — Other Ambulatory Visit: Payer: Self-pay | Admitting: Family Medicine

## 2015-07-15 VITALS — BP 145/68 | HR 68 | Ht 64.0 in | Wt 202.8 lb

## 2015-07-15 DIAGNOSIS — I5032 Chronic diastolic (congestive) heart failure: Secondary | ICD-10-CM | POA: Diagnosis not present

## 2015-07-15 DIAGNOSIS — I6523 Occlusion and stenosis of bilateral carotid arteries: Secondary | ICD-10-CM | POA: Diagnosis not present

## 2015-07-15 MED ORDER — LISINOPRIL 10 MG PO TABS: 10.0000 mg | ORAL_TABLET | Freq: Every day | ORAL | Status: DC

## 2015-07-15 MED ORDER — AMLODIPINE BESYLATE 5 MG PO TABS: 5.0000 mg | ORAL_TABLET | Freq: Every day | ORAL | Status: DC

## 2015-07-15 NOTE — Patient Instructions (Signed)
Medication Instructions:  Your physician has recommended you make the following change in your medication:  DECREASE amlodipine to 5mg  once daily START taking lisinopril 10mg  once daily   Labwork: BMET in one week  Testing/Procedures: none  Follow-Up: Your physician wants you to follow-up in: six months with Dr. Fletcher Anon.  You will receive a reminder letter in the mail two months in advance. If you don't receive a letter, please call our office to schedule the follow-up appointment.   Any Other Special Instructions Will Be Listed Below (If Applicable).     If you need a refill on your cardiac medications before your next appointment, please call your pharmacy.

## 2015-07-15 NOTE — Progress Notes (Signed)
Cardiology Office Note   Date:  07/15/2015   ID:  Joy [name], DOB 1930-10-25, MRN [id number]  PCP:  Arnette Norris, MD  Cardiologist:   Kathlyn Sacramento, MD   Chief Complaint  Patient presents with  . other    6 month f/u c/o chest pain and edema ankles. Meds reviewed verbally with pt.      History of Present Illness: Joy [name] is a 82 y.o. female who presents for a followup visit regarding coronary artery disease and chronic diastolic heart failure. She has known history of coronary artery disease with previous LAD stent in 2004 at [facility], vascular dementia, CVA, carotid artery stenting, chronic diastolic heart failure, peripheral arterial disease, hypertension and hyperlipidemia. Echocardiogram in 2013 showed normal LV systolic function.  Most recent nuclear stress test in 2015 showed no evidence of ischemia with normal ejection fraction. She has history of bradycardia that resolved after stopping metoprolol. She lives at [facility] [place] with her husband. Renal artery duplex in 2016 showed no evidence of renal artery stenosis. Since her last visit, she had recurrent episodes of bronchitis and was subsequently diagnosed with pneumonia in April. She was hypotensive at that time with mild acute renal failure. Thus, lisinopril was discontinued. Recently, she noticed worsening leg edema and gradual 5-6 pounds of weight gain. She complains of stable exertional dyspnea and she continues to have intermittent episodes of chest pain described as aching. She doesn't do a lot of physical activities as her walking is limited by balance issues. She walks slowly with a walker.    Past Medical History  Diagnosis Date  . HTN (hypertension)   . COPD (chronic obstructive pulmonary disease) ([place])   . Depression   . Thyroid disease   . PMR (polymyalgia rheumatica) ([facility])     with h/o steroid induced myopathy  . Anxiety   . Diastolic dysfunction     Echo 05/11/2010, EF 65-70%  . TIA (transient  ischemic attack)     11/05 s/p L CEA  . Kidney infection   . Carotid artery occlusion   . CAD (coronary artery disease)     h/o NSTEMI, LAD stent placement 10/04, cath 05/11/2010  . HLD (hyperlipidemia)   . Alzheimer disease     Past Surgical History  Procedure Laterality Date  . Carotid endarterectomy      left with stent placement 11/2003  . Iliac artery stent      right, 2001  . Cardiac catheterization      [facility]  . Cardiac catheterization    . Cardiac catheterization    . Lung biopsy       Current Outpatient Prescriptions  Medication Sig Dispense Refill  . ADVAIR DISKUS 250-50 MCG/DOSE AEPB INHALE 1 PUFF BY MOUTH TWICE A DAY 60 each 0  . albuterol (PROVENTIL HFA;VENTOLIN HFA) 108 (90 Base) MCG/ACT inhaler Inhale 2 puffs into the lungs every 6 (six) hours as needed for wheezing or shortness of breath. 1 Inhaler 0  . albuterol (PROVENTIL) (2.5 MG/3ML) 0.083% nebulizer solution Take 3 mLs (2.5 mg total) by nebulization every 6 (six) hours as needed for wheezing or shortness of breath. 150 mL 1  . amLODipine (NORVASC) 10 MG tablet Take 10 mg by mouth daily.    Marland Kitchen atorvastatin (LIPITOR) 40 MG tablet TAKE 1 TABLET BY MOUTH EVERY DAY 30 tablet 1  . clopidogrel (PLAVIX) 75 MG tablet Take 1 tablet (75 mg total) by mouth daily. 90 tablet 1  . furosemide (LASIX) 40 MG tablet Take  40 mg by mouth daily.    Marland Kitchen gabapentin (NEURONTIN) 800 MG tablet TAKE 1 TABLET BY MOUTH TWICE A DAY 60 tablet 5  . levothyroxine (SYNTHROID, LEVOTHROID) 25 MCG tablet Take 25 mcg by mouth daily before breakfast.    . nitroGLYCERIN (NITROSTAT) 0.4 MG SL tablet Place 0.4 mg under the tongue every 5 (five) minutes as needed.      . ranitidine (ZANTAC) 300 MG tablet TAKE 1 TABLET (300 MG TOTAL) BY MOUTH AT BEDTIME. 90 tablet 2  . sertraline (ZOLOFT) 100 MG tablet TAKE 1 TABLET BY MOUTH EVERY DAY 30 tablet 2  . SPIRIVA HANDIHALER 18 MCG inhalation capsule PLACE 1 CAPSULE (18 MCG TOTAL) INTO INHALER AND INHALE DAILY. 30  capsule 10  . traZODone (DESYREL) 50 MG tablet TAKE 1/2 TO 1 TABLET AT BEDTIME AS NEEDED FOR SLEEP 30 tablet 5  . vitamin B-12 (CYANOCOBALAMIN) 1000 MCG tablet Take 1,000 mcg by mouth daily.    . Vitamin D, Ergocalciferol, (DRISDOL) 50000 units CAPS capsule Take 50,000 Units by mouth every 7 (seven) days.     No current facility-administered medications for this visit.    Allergies:   Codeine; Prednisone; and Shellfish allergy    Social History:  The patient  reports that she quit smoking about 19 years ago. Her smoking use included Cigarettes. She has a 25 pack-year smoking history. She has never used smokeless tobacco. She reports that she does not drink alcohol or use illicit drugs.   Family History:  The patient's family history is not on file.    ROS:  Please see the history of present illness.   Otherwise, review of systems are positive for none.   All other systems are reviewed and negative.    PHYSICAL EXAM: VS:  BP 145/68 mmHg  Pulse 68  Ht 5\' 4"  (1.626 m)  Wt 202 lb 12 oz (91.967 kg)  BMI 34.78 kg/m2 , BMI Body mass index is 34.78 kg/(m^2). GEN: Well nourished, well developed, in no acute distress HEENT: normal Neck: no JVD, carotid bruits, or masses Cardiac: RRR; no  rubs, or gallops. One out of 6 early peaking systolic murmur in the aortic area. Moderate bilateral leg edema Respiratory:  clear to auscultation bilaterally, normal work of breathing GI: soft, nontender, nondistended, + BS MS: no deformity or atrophy Skin: warm and dry, no rash Neuro:  Strength and sensation are intact Psych: euthymic mood, full affect   EKG:  EKG is ordered today. The ekg ordered today demonstrates normal sinus rhythm with no significant ST or T wave changes.   Recent Labs: 05/05/2015: TSH 2.53 06/06/2015: ALT 8*; BUN 16; Creatinine, Ser 1.19*; Hemoglobin 10.8*; Platelets 168; Potassium 3.8; Sodium 143    Lipid Panel    Component Value Date/Time   CHOL 180 01/02/2015 1103   CHOL  142 08/20/2012 0026   TRIG 263.0* 01/02/2015 1103   TRIG 137 08/20/2012 0026   HDL 41.60 01/02/2015 1103   HDL 39* 08/20/2012 0026   CHOLHDL 4 01/02/2015 1103   VLDL 52.6* 01/02/2015 1103   VLDL 27 08/20/2012 0026   LDLCALC 68 11/04/2014 1211   LDLCALC 76 08/20/2012 0026   LDLDIRECT 99.0 01/02/2015 1103      Wt Readings from Last 3 Encounters:  07/15/15 202 lb 12 oz (91.967 kg)  07/10/15 200 lb (90.719 kg)  06/06/15 195 lb (88.451 kg)       ASSESSMENT AND PLAN:  1.  Coronary artery disease involving native coronary arteries: Intermittent episodes of  atypical chest pain but she is overall a poor historian. The symptoms are not consistent with angina. I recommend continuing medical therapy.  2. Chronic diastolic heart failure: Recent weight gain and worsening leg edema. Amlodipine might be contributing. She used to be on lisinopril which was discontinued in April for hypotension. I elected to resume lisinopril at a smaller dose of 10 mg once daily. Check basic metabolic profile in one week.  3. Essential hypertension: Blood pressure is mildly elevated. Given her worsening leg edema, I decreased amlodipine to 5 mg daily and added lisinopril. If renal function continues to be stable on lisinopril, we can consider maximizing the dose and stopping amlodipine.  4. Hyperlipidemia: Continue treatment with atorvastatin. Most recent LDL was 68.    Disposition:   FU with me in 6 months  Signed,  Kathlyn Sacramento, MD  07/15/2015 2:52 PM    [place]

## 2015-07-22 ENCOUNTER — Other Ambulatory Visit: Payer: Self-pay

## 2015-07-29 ENCOUNTER — Other Ambulatory Visit (INDEPENDENT_AMBULATORY_CARE_PROVIDER_SITE_OTHER): Payer: Medicare Other

## 2015-07-29 DIAGNOSIS — I5032 Chronic diastolic (congestive) heart failure: Secondary | ICD-10-CM

## 2015-07-30 LAB — BASIC METABOLIC PANEL
BUN / CREAT RATIO: 14 (ref 12–28)
BUN: 21 mg/dL (ref 8–27)
CALCIUM: 8.7 mg/dL (ref 8.7–10.3)
CO2: 25 mmol/L (ref 18–29)
CREATININE: 1.45 mg/dL — AB (ref 0.57–1.00)
Chloride: 100 mmol/L (ref 96–106)
GFR calc Af Amer: 39 mL/min/{1.73_m2} — ABNORMAL LOW (ref >59)
GFR calc non Af Amer: 34 mL/min/{1.73_m2} — ABNORMAL LOW (ref >59)
GLUCOSE: 92 mg/dL (ref 65–99)
Potassium: 4.1 mmol/L (ref 3.5–5.2)
Sodium: 145 mmol/L — ABNORMAL HIGH (ref 134–144)

## 2015-07-31 ENCOUNTER — Other Ambulatory Visit: Payer: Self-pay

## 2015-07-31 ENCOUNTER — Other Ambulatory Visit: Payer: Self-pay | Admitting: Internal Medicine

## 2015-07-31 DIAGNOSIS — J449 Chronic obstructive pulmonary disease, unspecified: Secondary | ICD-10-CM | POA: Diagnosis not present

## 2015-07-31 DIAGNOSIS — J181 Lobar pneumonia, unspecified organism: Principal | ICD-10-CM

## 2015-07-31 DIAGNOSIS — I10 Essential (primary) hypertension: Secondary | ICD-10-CM

## 2015-07-31 DIAGNOSIS — J9611 Chronic respiratory failure with hypoxia: Secondary | ICD-10-CM | POA: Diagnosis not present

## 2015-07-31 DIAGNOSIS — J189 Pneumonia, unspecified organism: Secondary | ICD-10-CM

## 2015-07-31 DIAGNOSIS — R0602 Shortness of breath: Secondary | ICD-10-CM | POA: Diagnosis not present

## 2015-08-04 ENCOUNTER — Ambulatory Visit: Payer: Medicare Other

## 2015-08-08 ENCOUNTER — Ambulatory Visit
Admission: RE | Admit: 2015-08-08 | Discharge: 2015-08-08 | Disposition: A | Discharge status: Home / Self Care | Payer: Medicare Other | Source: Ambulatory Visit | Attending: Internal Medicine | Admitting: Internal Medicine

## 2015-08-08 DIAGNOSIS — I700 Atherosclerosis of aorta: Secondary | ICD-10-CM | POA: Insufficient documentation

## 2015-08-08 DIAGNOSIS — I2510 Atherosclerotic heart disease of native coronary artery without angina pectoris: Secondary | ICD-10-CM | POA: Diagnosis not present

## 2015-08-08 DIAGNOSIS — J9811 Atelectasis: Secondary | ICD-10-CM | POA: Insufficient documentation

## 2015-08-08 DIAGNOSIS — J189 Pneumonia, unspecified organism: Secondary | ICD-10-CM | POA: Diagnosis present

## 2015-08-08 DIAGNOSIS — J438 Other emphysema: Secondary | ICD-10-CM | POA: Diagnosis not present

## 2015-08-08 DIAGNOSIS — J439 Emphysema, unspecified: Secondary | ICD-10-CM | POA: Diagnosis not present

## 2015-08-08 DIAGNOSIS — J181 Lobar pneumonia, unspecified organism: Secondary | ICD-10-CM

## 2015-08-08 HISTORY — DX: Essential (primary) hypertension: I10

## 2015-08-08 MED ORDER — IOPAMIDOL (ISOVUE-300) INJECTION 61%
60.0000 mL | Freq: Once | INTRAVENOUS | Status: AC | PRN
  Administered 2015-08-08: 60 mL via INTRAVENOUS

## 2015-08-09 ENCOUNTER — Encounter: Payer: Self-pay | Admitting: Emergency Medicine

## 2015-08-09 ENCOUNTER — Other Ambulatory Visit: Payer: Self-pay

## 2015-08-09 ENCOUNTER — Emergency Department: Payer: Medicare Other

## 2015-08-09 ENCOUNTER — Emergency Department
Admission: EM | Admit: 2015-08-09 | Discharge: 2015-08-09 | Disposition: A | Discharge status: Home / Self Care | Payer: Medicare Other | Attending: Emergency Medicine | Admitting: Emergency Medicine

## 2015-08-09 DIAGNOSIS — J449 Chronic obstructive pulmonary disease, unspecified: Secondary | ICD-10-CM | POA: Insufficient documentation

## 2015-08-09 DIAGNOSIS — M6281 Muscle weakness (generalized): Secondary | ICD-10-CM | POA: Insufficient documentation

## 2015-08-09 DIAGNOSIS — Z87891 Personal history of nicotine dependence: Secondary | ICD-10-CM | POA: Diagnosis not present

## 2015-08-09 DIAGNOSIS — I130 Hypertensive heart and chronic kidney disease with heart failure and stage 1 through stage 4 chronic kidney disease, or unspecified chronic kidney disease: Secondary | ICD-10-CM | POA: Insufficient documentation

## 2015-08-09 DIAGNOSIS — N390 Urinary tract infection, site not specified: Secondary | ICD-10-CM | POA: Diagnosis not present

## 2015-08-09 DIAGNOSIS — I5032 Chronic diastolic (congestive) heart failure: Secondary | ICD-10-CM | POA: Insufficient documentation

## 2015-08-09 DIAGNOSIS — G309 Alzheimer's disease, unspecified: Secondary | ICD-10-CM | POA: Insufficient documentation

## 2015-08-09 DIAGNOSIS — Z7951 Long term (current) use of inhaled steroids: Secondary | ICD-10-CM | POA: Insufficient documentation

## 2015-08-09 DIAGNOSIS — N183 Chronic kidney disease, stage 3 (moderate): Secondary | ICD-10-CM | POA: Insufficient documentation

## 2015-08-09 DIAGNOSIS — I2510 Atherosclerotic heart disease of native coronary artery without angina pectoris: Secondary | ICD-10-CM | POA: Diagnosis not present

## 2015-08-09 DIAGNOSIS — R531 Weakness: Secondary | ICD-10-CM

## 2015-08-09 DIAGNOSIS — R42 Dizziness and giddiness: Secondary | ICD-10-CM | POA: Diagnosis not present

## 2015-08-09 LAB — CBC WITH DIFFERENTIAL/PLATELET
BASOS ABS: 0 10*3/uL (ref 0–0.1)
BASOS PCT: 1 %
Eosinophils Absolute: 0.1 10*3/uL (ref 0–0.7)
Eosinophils Relative: 2 %
HEMATOCRIT: 30.4 % — AB (ref 35.0–47.0)
HEMOGLOBIN: 10.5 g/dL — AB (ref 12.0–16.0)
Lymphocytes Relative: 13 %
Lymphs Abs: 0.7 10*3/uL — ABNORMAL LOW (ref 1.0–3.6)
MCH: 30.9 pg (ref 26.0–34.0)
MCHC: 34.7 g/dL (ref 32.0–36.0)
MCV: 89.1 fL (ref 80.0–100.0)
MONO ABS: 0.6 10*3/uL (ref 0.2–0.9)
Monocytes Relative: 10 %
NEUTROS ABS: 4.2 10*3/uL (ref 1.4–6.5)
NEUTROS PCT: 74 %
Platelets: 134 10*3/uL — ABNORMAL LOW (ref 150–440)
RBC: 3.41 MIL/uL — AB (ref 3.80–5.20)
RDW: 15.1 % — AB (ref 11.5–14.5)
WBC: 5.6 10*3/uL (ref 3.6–11.0)

## 2015-08-09 LAB — BASIC METABOLIC PANEL
ANION GAP: 6 (ref 5–15)
BUN: 20 mg/dL (ref 6–20)
CALCIUM: 8.6 mg/dL — AB (ref 8.9–10.3)
CO2: 34 mmol/L — AB (ref 22–32)
Chloride: 104 mmol/L (ref 101–111)
Creatinine, Ser: 1.22 mg/dL — ABNORMAL HIGH (ref 0.44–1.00)
GFR calc non Af Amer: 40 mL/min — ABNORMAL LOW (ref >60)
GFR, EST AFRICAN AMERICAN: 46 mL/min — AB (ref >60)
Glucose, Bld: 111 mg/dL — ABNORMAL HIGH (ref 65–99)
Potassium: 3.7 mmol/L (ref 3.5–5.1)
Sodium: 144 mmol/L (ref 135–145)

## 2015-08-09 LAB — URINALYSIS COMPLETE WITH MICROSCOPIC (ARMC ONLY)
BILIRUBIN URINE: NEGATIVE
GLUCOSE, UA: NEGATIVE mg/dL
HGB URINE DIPSTICK: NEGATIVE
Ketones, ur: NEGATIVE mg/dL
Leukocytes, UA: NEGATIVE
NITRITE: POSITIVE — AB
Protein, ur: NEGATIVE mg/dL
SPECIFIC GRAVITY, URINE: 1.011 (ref 1.005–1.030)
Squamous Epithelial / LPF: NONE SEEN
pH: 6 (ref 5.0–8.0)

## 2015-08-09 LAB — TROPONIN I: Troponin I: <0.03 ng/mL (ref <0.03)

## 2015-08-09 MED ORDER — SODIUM CHLORIDE 0.9 % IV BOLUS (SEPSIS)
500.0000 mL | Freq: Once | INTRAVENOUS | Status: AC
  Administered 2015-08-09: 500 mL via INTRAVENOUS

## 2015-08-09 MED ORDER — DEXTROSE 5 % IV SOLN
1.0000 g | Freq: Once | INTRAVENOUS | Status: AC
  Administered 2015-08-09: 1 g via INTRAVENOUS
  Filled 2015-08-09: qty 10

## 2015-08-09 MED ORDER — CEPHALEXIN 500 MG PO CAPS: 500.0000 mg | ORAL_CAPSULE | Freq: Three times a day (TID) | ORAL | 0 refills | Status: DC

## 2015-08-09 NOTE — ED Triage Notes (Signed)
Pt BIB from [facility]. Pt complains heart flutter, dizziness, and right sided weakness that started about 0830. CBG 140. Stroke screen neg upon EMS arrival. Pt denies those symptoms at this time. C/o headache.

## 2015-08-09 NOTE — ED Provider Notes (Signed)
[facility] Emergency Department Provider Note    ____________________________________________   I have reviewed the triage vital signs and the nursing notes.   HISTORY  Chief Complaint Dizziness and Weakness   History limited by: Not Limited, some history obtained from daughter   HPI Joy [name] is a 82 y.o. female who presents to the emergency department today accompanied by her daughter because of concerns for weakness. The daughter states that she was with her mother this morning. When she got her off and started to use the walker that the patient was a little off balance. Daughter states it is not unusual for the patient to be off balance however usually with a walker she is okay. The patient did not have any chest pain or shortness of breath during this time. The patient denies any recent fevers.   Past Medical History:  Diagnosis Date  . Alzheimer disease   . Anxiety   . CAD (coronary artery disease)    h/o NSTEMI, LAD stent placement 10/04, cath 05/11/2010  . Carotid artery occlusion   . COPD (chronic obstructive pulmonary disease) ([place])   . Depression   . Diastolic dysfunction    Echo 05/11/2010, EF 65-70%  . HLD (hyperlipidemia)   . HTN (hypertension)   . Hypertension   . Kidney infection   . PMR (polymyalgia rheumatica) ([facility])    with h/o steroid induced myopathy  . Thyroid disease   . TIA (transient ischemic attack)    11/05 s/p L CEA    Patient Active Problem List   Diagnosis Date Noted  . Hypotension 05/05/2015  . Pedal edema 05/05/2015  . COPD exacerbation ([place]) 04/25/2015  . Left lower lobe pneumonia 04/25/2015  . Chest pain 02/13/2015  . Allergic rhinitis 11/04/2014  . Chronic diastolic heart failure ([place]) 07/22/2014  . CKD (chronic kidney disease) stage 3, GFR 30-59 ml/min 06/11/2014  . Carotid artery disease ([place]) 04/08/2014  . Nevus of cheek 10/30/2013  . CAD (coronary artery disease)   . HLD (hyperlipidemia)   .  Lung nodule 01/15/2013  . Memory loss 01/15/2013  . Weakness generalized 12/26/2012  . Incontinence 12/26/2012  . GERD (gastroesophageal reflux disease) 09/06/2012  . Neuropathy ([place]) 01/14/2011  . Insomnia 01/14/2011  . MELAS (mitochondrial encephalopathy, lactic acidosis and stroke-like episodes) ([place]) 11/02/2010  . HTN (hypertension)   . COPD (chronic obstructive pulmonary disease) ([place])   . Depression   . Thyroid disease     Past Surgical History:  Procedure Laterality Date  . CARDIAC CATHETERIZATION     [facility]  . CARDIAC CATHETERIZATION    . CARDIAC CATHETERIZATION    . CAROTID ENDARTERECTOMY     left with stent placement 11/2003  . ILIAC ARTERY STENT     right, 2001  . LUNG BIOPSY      Prior to Admission medications   Medication Sig Start Date End Date Taking? Authorizing Provider  ADVAIR DISKUS 250-50 MCG/DOSE AEPB INHALE 1 PUFF BY MOUTH TWICE A DAY 07/16/15   Lucille Passy, MD  albuterol (PROVENTIL HFA;VENTOLIN HFA) 108 (90 Base) MCG/ACT inhaler Inhale 2 puffs into the lungs every 6 (six) hours as needed for wheezing or shortness of breath. 04/17/15   Joanne Gavel, MD  albuterol (PROVENTIL) (2.5 MG/3ML) 0.083% nebulizer solution Take 3 mLs (2.5 mg total) by nebulization every 6 (six) hours as needed for wheezing or shortness of breath. 04/28/15   Lucille Passy, MD  amLODipine (NORVASC) 5 MG tablet Take 1 tablet (5 mg  total) by mouth daily. 07/15/15   Wellington Hampshire, MD  atorvastatin (LIPITOR) 40 MG tablet TAKE 1 TABLET BY MOUTH EVERY DAY 07/01/15   Lucille Passy, MD  clopidogrel (PLAVIX) 75 MG tablet Take 1 tablet (75 mg total) by mouth daily. 05/22/15   Lucille Passy, MD  furosemide (LASIX) 40 MG tablet Take 40 mg by mouth daily.    Historical Provider, MD  gabapentin (NEURONTIN) 800 MG tablet TAKE 1 TABLET BY MOUTH TWICE A DAY 05/05/15   Lucille Passy, MD  levothyroxine (SYNTHROID, LEVOTHROID) 25 MCG tablet Take 25 mcg by mouth daily before breakfast.    Historical Provider, MD   lisinopril (PRINIVIL,ZESTRIL) 10 MG tablet Take 1 tablet (10 mg total) by mouth daily. 07/15/15   Wellington Hampshire, MD  nitroGLYCERIN (NITROSTAT) 0.4 MG SL tablet Place 0.4 mg under the tongue every 5 (five) minutes as needed.      Historical Provider, MD  ranitidine (ZANTAC) 300 MG tablet TAKE 1 TABLET (300 MG TOTAL) BY MOUTH AT BEDTIME. 05/26/15   Lucille Passy, MD  sertraline (ZOLOFT) 100 MG tablet TAKE 1 TABLET BY MOUTH EVERY DAY 05/12/15   Lucille Passy, MD  SPIRIVA HANDIHALER 18 MCG inhalation capsule PLACE 1 CAPSULE (18 MCG TOTAL) INTO INHALER AND INHALE DAILY. 06/03/15   Lucille Passy, MD  traZODone (DESYREL) 50 MG tablet TAKE 1/2 TO 1 TABLET AT BEDTIME AS NEEDED FOR SLEEP 07/10/15   Lucille Passy, MD  vitamin B-12 (CYANOCOBALAMIN) 1000 MCG tablet Take 1,000 mcg by mouth daily.    Historical Provider, MD  Vitamin D, Ergocalciferol, (DRISDOL) 50000 units CAPS capsule Take 50,000 Units by mouth every 7 (seven) days.    Historical Provider, MD    Allergies Codeine; Prednisone; and Shellfish allergy  No family history on file.  Social History Social History  Substance Use Topics  . Smoking status: Former Smoker    Packs/day: 1.00    Years: 25.00    Types: Cigarettes    Quit date: 01/05/1996  . Smokeless tobacco: Never Used  . Alcohol use No    Review of Systems  Constitutional: Negative for fever. Cardiovascular: Negative for chest pain. Respiratory: Negative for shortness of breath. Gastrointestinal: Negative for abdominal pain, vomiting and diarrhea. Neurological: Negative for headaches, focal weakness or numbness.  10-point ROS otherwise negative.  ____________________________________________   PHYSICAL EXAM:  VITAL SIGNS: ED Triage Vitals  Enc Vitals Group     BP 08/09/15 1011 (!) 127/39     Pulse Rate 08/09/15 1011 61     Resp 08/09/15 1011 20     Temp 08/09/15 1011 97.7 F (36.5 C)     Temp Source 08/09/15 1011 Oral     SpO2 08/09/15 1011 97 %     Weight 08/09/15  1011 202 lb (91.6 kg)     Height 08/09/15 1011 5\' 4"  (1.626 m)     Head Circumference --      Peak Flow --      Pain Score 08/09/15 1012 7   Constitutional: Alert and oriented. Well appearing and in no distress. Eyes: Conjunctivae are normal. PERRL. Normal extraocular movements. ENT   Head: Normocephalic and atraumatic.   Nose: No congestion/rhinnorhea.   Mouth/Throat: Mucous membranes are moist.   Neck: No stridor. Hematological/Lymphatic/Immunilogical: No cervical lymphadenopathy. Cardiovascular: Normal rate, regular rhythm.  No murmurs, rubs, or gallops. Respiratory: Normal respiratory effort without tachypnea nor retractions. Breath sounds are clear and equal bilaterally. No wheezes/rales/rhonchi. Gastrointestinal: Soft  and nontender. No distention.  Genitourinary: Deferred Musculoskeletal: Normal range of motion in all extremities. No joint effusions.  No lower extremity tenderness nor edema. Neurologic:  Normal speech and language. No gross focal neurologic deficits are appreciated.  Skin:  Skin is warm, dry and intact. No rash noted. Psychiatric: Mood and affect are normal. Speech and behavior are normal. Patient exhibits appropriate insight and judgment.  ____________________________________________    LABS (pertinent positives/negatives)  Labs Reviewed  CBC WITH DIFFERENTIAL/PLATELET - Abnormal; Notable for the following:       Result Value   RBC 3.41 (*)    Hemoglobin 10.5 (*)    HCT 30.4 (*)    RDW 15.1 (*)    Platelets 134 (*)    Lymphs Abs 0.7 (*)    All other components within normal limits  BASIC METABOLIC PANEL - Abnormal; Notable for the following:    CO2 34 (*)    Glucose, Bld 111 (*)    Creatinine, Ser 1.22 (*)    Calcium 8.6 (*)    GFR calc non Af Amer 40 (*)    GFR calc Af Amer 46 (*)    All other components within normal limits  URINALYSIS COMPLETEWITH MICROSCOPIC ([facility] ONLY) - Abnormal; Notable for the following:    Color, Urine  YELLOW (*)    APPearance CLEAR (*)    Nitrite POSITIVE (*)    Bacteria, UA RARE (*)    All other components within normal limits  URINE CULTURE  TROPONIN I  TROPONIN I     ____________________________________________   EKG  I, Nance Pear, attending physician, personally viewed and interpreted this EKG  EKG Time: 0916 Rate: 60 Rhythm: normal sinus rhythm Axis: normal Intervals: qtc 444 QRS: narrow ST changes: no st elevation, t wave inversion V1 V2 Impression: abnormal ekg   ____________________________________________    RADIOLOGY  CT head IMPRESSION: 1. No acute intracranial abnormalities. 2. Chronic microvascular disease and brain atrophy.  ____________________________________________   PROCEDURES  Procedures  ____________________________________________   INITIAL IMPRESSION / ASSESSMENT AND PLAN / ED COURSE  Pertinent labs & imaging results that were available during my care of the patient were reviewed by me and considered in my medical decision making (see chart for details).  Patient presented to the emergency department today because of concerns for weakness. Patient's urine did test positive for nitrite. Think it could potentially all be stemming from a urinary tract infection. However will check a second troponin.  Clinical Course   Second troponin negative.  ____________________________________________   FINAL CLINICAL IMPRESSION(S) / ED DIAGNOSES  Final diagnoses:  UTI (lower urinary tract infection)  Generalized weakness     Note: This dictation was prepared with Dragon dictation. Any transcriptional errors that result from this process are unintentional    Nance Pear, MD 08/09/15 1546

## 2015-08-09 NOTE — ED Notes (Signed)
To CT scan

## 2015-08-09 NOTE — ED Notes (Signed)
First attempt at urine specimen with insufficient amount pt on bedpan and trying to urinate at this time. Daughter at bedside.

## 2015-08-09 NOTE — Discharge Instructions (Signed)
Please seek medical attention for any high fevers, chest pain, shortness of breath, change in behavior, persistent vomiting, bloody stool or any other new or concerning symptoms.  

## 2015-08-09 NOTE — ED Provider Notes (Signed)
-----------------------------------------   4:31 PM on 08/09/2015 -----------------------------------------   Blood pressure (!) 136/39, pulse (!) 59, temperature 97.7 F (36.5 C), temperature source Oral, resp. rate 12, height 5\' 4"  (1.626 m), weight 202 lb (91.6 kg), SpO2 94 %.  Assuming care from Dr. Archie Balboa.  In short, Joy [name] is a 82 y.o. female with a chief complaint of Dizziness and Weakness .  Refer to the original H&P for additional details.  The current plan of care is to follow patient with IV antibiotic therapy for what appears to be a urinary tract infection I will discharge her on antibiotic therapy with urine culture pending. Patient's otherwise been stable and feels symptomatically improved and thus far tolerating her IV antibiotics for urinary tract infection well.    Daymon Larsen, MD 08/09/15 [phone]

## 2015-08-09 NOTE — ED Notes (Signed)
Return from CT

## 2015-08-10 ENCOUNTER — Other Ambulatory Visit: Payer: Self-pay | Admitting: Family Medicine

## 2015-08-11 LAB — URINE CULTURE: Culture: >=100000 — AB

## 2015-08-12 ENCOUNTER — Other Ambulatory Visit: Payer: Self-pay | Admitting: Family Medicine

## 2015-08-12 ENCOUNTER — Telehealth: Payer: Self-pay | Admitting: Cardiovascular Disease

## 2015-08-12 NOTE — Telephone Encounter (Signed)
°*  STAT* If patient is at the pharmacy, call can be transferred to refill team.   1. Which medications need to be refilled? (please list name of each medication and dose if known) Frusemide (would like to know what dose pt needs to be on)  2. Which pharmacy/location (including street and city if local pharmacy) is medication to be sent to?[facility] [address]   3. Do they need a 30 day or 90 day supply? 90 day    Pt c/o swelling: STAT is pt has developed SOB within 24 hours  1. How long have you been experiencing swelling? Since Sunday.  2. Where is the swelling located? feet  3.  Are you currently taking a "fluid pill"?missed one day  4.  Are you currently SOB? no  5.  Have you traveled recently?no   Pt was in [facility] Sunday for UTI  BP has been running a bit high along with Swelling.

## 2015-08-12 NOTE — Telephone Encounter (Signed)
Left message for [name], pt daughter on cell VM.

## 2015-08-12 NOTE — Telephone Encounter (Signed)
Please review complaints below!

## 2015-08-13 NOTE — Telephone Encounter (Signed)
S/w pt daughter, [name], who reports pt recently admitted to [facility] for UTI. She thinks lasix dosage was changed to alternating 1 tablet one day and 1/2 tablet the next day but can not recall which doctor made the adjustment. Reviewed notes and I do not see where Dr. Fletcher Anon changed lasix. Upon review of charts, Dr. Deborra Medina suggested lasix adjustment at [place]. Advised Katie to contact Dr. Hulen Shouts office for further advice. She verbalized understanding and is appreciative of the call.

## 2015-08-13 NOTE — Telephone Encounter (Signed)
Pt daughter is returning your call. 

## 2015-08-14 ENCOUNTER — Other Ambulatory Visit: Payer: Self-pay | Admitting: Family Medicine

## 2015-08-20 DIAGNOSIS — R0602 Shortness of breath: Secondary | ICD-10-CM | POA: Diagnosis not present

## 2015-08-26 ENCOUNTER — Other Ambulatory Visit: Payer: Self-pay | Admitting: Family Medicine

## 2015-09-02 ENCOUNTER — Other Ambulatory Visit (INDEPENDENT_AMBULATORY_CARE_PROVIDER_SITE_OTHER): Payer: Medicare Other

## 2015-09-02 DIAGNOSIS — I10 Essential (primary) hypertension: Secondary | ICD-10-CM | POA: Diagnosis not present

## 2015-09-03 LAB — BASIC METABOLIC PANEL
BUN / CREAT RATIO: 20 (ref 12–28)
BUN: 35 mg/dL — AB (ref 8–27)
CALCIUM: 8.8 mg/dL (ref 8.7–10.3)
CHLORIDE: 103 mmol/L (ref 96–106)
CO2: 28 mmol/L (ref 18–29)
Creatinine, Ser: 1.72 mg/dL — ABNORMAL HIGH (ref 0.57–1.00)
GFR, EST AFRICAN AMERICAN: 31 mL/min/{1.73_m2} — AB (ref >59)
GFR, EST NON AFRICAN AMERICAN: 27 mL/min/{1.73_m2} — AB (ref >59)
Glucose: 96 mg/dL (ref 65–99)
Potassium: 4.4 mmol/L (ref 3.5–5.2)
Sodium: 148 mmol/L — ABNORMAL HIGH (ref 134–144)

## 2015-09-04 ENCOUNTER — Other Ambulatory Visit: Payer: Self-pay

## 2015-09-04 DIAGNOSIS — J449 Chronic obstructive pulmonary disease, unspecified: Secondary | ICD-10-CM | POA: Diagnosis not present

## 2015-09-04 DIAGNOSIS — J9611 Chronic respiratory failure with hypoxia: Secondary | ICD-10-CM | POA: Diagnosis not present

## 2015-09-04 DIAGNOSIS — R0602 Shortness of breath: Secondary | ICD-10-CM | POA: Diagnosis not present

## 2015-09-04 MED ORDER — FUROSEMIDE 20 MG PO TABS: 20.0000 mg | ORAL_TABLET | Freq: Every day | ORAL | 3 refills | Status: DC

## 2015-09-27 ENCOUNTER — Other Ambulatory Visit: Payer: Self-pay | Admitting: Family Medicine

## 2015-10-03 ENCOUNTER — Observation Stay
Admission: EM | Admit: 2015-10-03 | Discharge: 2015-10-06 | Disposition: A | Discharge status: Home / Self Care | Payer: Medicare Other | Attending: Internal Medicine | Admitting: Internal Medicine

## 2015-10-03 ENCOUNTER — Other Ambulatory Visit: Payer: Self-pay | Admitting: Family Medicine

## 2015-10-03 ENCOUNTER — Telehealth: Payer: Self-pay

## 2015-10-03 DIAGNOSIS — N183 Chronic kidney disease, stage 3 (moderate): Secondary | ICD-10-CM | POA: Insufficient documentation

## 2015-10-03 DIAGNOSIS — I130 Hypertensive heart and chronic kidney disease with heart failure and stage 1 through stage 4 chronic kidney disease, or unspecified chronic kidney disease: Secondary | ICD-10-CM | POA: Diagnosis not present

## 2015-10-03 DIAGNOSIS — I252 Old myocardial infarction: Secondary | ICD-10-CM | POA: Insufficient documentation

## 2015-10-03 DIAGNOSIS — Z8673 Personal history of transient ischemic attack (TIA), and cerebral infarction without residual deficits: Secondary | ICD-10-CM | POA: Insufficient documentation

## 2015-10-03 DIAGNOSIS — F419 Anxiety disorder, unspecified: Secondary | ICD-10-CM | POA: Insufficient documentation

## 2015-10-03 DIAGNOSIS — M353 Polymyalgia rheumatica: Secondary | ICD-10-CM | POA: Diagnosis not present

## 2015-10-03 DIAGNOSIS — R262 Difficulty in walking, not elsewhere classified: Secondary | ICD-10-CM

## 2015-10-03 DIAGNOSIS — E785 Hyperlipidemia, unspecified: Secondary | ICD-10-CM | POA: Diagnosis not present

## 2015-10-03 DIAGNOSIS — B9689 Other specified bacterial agents as the cause of diseases classified elsewhere: Secondary | ICD-10-CM | POA: Diagnosis not present

## 2015-10-03 DIAGNOSIS — G309 Alzheimer's disease, unspecified: Secondary | ICD-10-CM | POA: Diagnosis not present

## 2015-10-03 DIAGNOSIS — N170 Acute kidney failure with tubular necrosis: Secondary | ICD-10-CM | POA: Insufficient documentation

## 2015-10-03 DIAGNOSIS — F0280 Dementia in other diseases classified elsewhere without behavioral disturbance: Secondary | ICD-10-CM | POA: Diagnosis not present

## 2015-10-03 DIAGNOSIS — I5032 Chronic diastolic (congestive) heart failure: Secondary | ICD-10-CM | POA: Diagnosis not present

## 2015-10-03 DIAGNOSIS — Z87891 Personal history of nicotine dependence: Secondary | ICD-10-CM | POA: Diagnosis not present

## 2015-10-03 DIAGNOSIS — K5900 Constipation, unspecified: Secondary | ICD-10-CM | POA: Diagnosis not present

## 2015-10-03 DIAGNOSIS — G629 Polyneuropathy, unspecified: Secondary | ICD-10-CM | POA: Insufficient documentation

## 2015-10-03 DIAGNOSIS — J449 Chronic obstructive pulmonary disease, unspecified: Secondary | ICD-10-CM | POA: Diagnosis not present

## 2015-10-03 DIAGNOSIS — K219 Gastro-esophageal reflux disease without esophagitis: Secondary | ICD-10-CM | POA: Insufficient documentation

## 2015-10-03 DIAGNOSIS — Z79899 Other long term (current) drug therapy: Secondary | ICD-10-CM | POA: Diagnosis not present

## 2015-10-03 DIAGNOSIS — G9341 Metabolic encephalopathy: Secondary | ICD-10-CM | POA: Insufficient documentation

## 2015-10-03 DIAGNOSIS — E079 Disorder of thyroid, unspecified: Secondary | ICD-10-CM | POA: Insufficient documentation

## 2015-10-03 DIAGNOSIS — N390 Urinary tract infection, site not specified: Principal | ICD-10-CM | POA: Insufficient documentation

## 2015-10-03 DIAGNOSIS — R4182 Altered mental status, unspecified: Secondary | ICD-10-CM | POA: Diagnosis not present

## 2015-10-03 DIAGNOSIS — Z7902 Long term (current) use of antithrombotics/antiplatelets: Secondary | ICD-10-CM | POA: Insufficient documentation

## 2015-10-03 DIAGNOSIS — F329 Major depressive disorder, single episode, unspecified: Secondary | ICD-10-CM | POA: Diagnosis not present

## 2015-10-03 DIAGNOSIS — R531 Weakness: Secondary | ICD-10-CM

## 2015-10-03 DIAGNOSIS — I2510 Atherosclerotic heart disease of native coronary artery without angina pectoris: Secondary | ICD-10-CM | POA: Insufficient documentation

## 2015-10-03 DIAGNOSIS — Z9981 Dependence on supplemental oxygen: Secondary | ICD-10-CM | POA: Insufficient documentation

## 2015-10-03 LAB — CBC
HCT: 35.3 % (ref 35.0–47.0)
HEMOGLOBIN: 11.8 g/dL — AB (ref 12.0–16.0)
MCH: 30.6 pg (ref 26.0–34.0)
MCHC: 33.5 g/dL (ref 32.0–36.0)
MCV: 91.6 fL (ref 80.0–100.0)
Platelets: 164 10*3/uL (ref 150–440)
RBC: 3.86 MIL/uL (ref 3.80–5.20)
RDW: 16.9 % — ABNORMAL HIGH (ref 11.5–14.5)
WBC: 6.5 10*3/uL (ref 3.6–11.0)

## 2015-10-03 LAB — COMPREHENSIVE METABOLIC PANEL
ALBUMIN: 4.3 g/dL (ref 3.5–5.0)
ALK PHOS: 139 U/L — AB (ref 38–126)
ALT: 9 U/L — AB (ref 14–54)
ANION GAP: 8 (ref 5–15)
AST: 17 U/L (ref 15–41)
BUN: 31 mg/dL — AB (ref 6–20)
CALCIUM: 8.8 mg/dL — AB (ref 8.9–10.3)
CO2: 32 mmol/L (ref 22–32)
CREATININE: 1.52 mg/dL — AB (ref 0.44–1.00)
Chloride: 104 mmol/L (ref 101–111)
GFR calc Af Amer: 36 mL/min — ABNORMAL LOW (ref >60)
GFR calc non Af Amer: 31 mL/min — ABNORMAL LOW (ref >60)
GLUCOSE: 162 mg/dL — AB (ref 65–99)
Potassium: 3.9 mmol/L (ref 3.5–5.1)
SODIUM: 144 mmol/L (ref 135–145)
Total Bilirubin: 0.4 mg/dL (ref 0.3–1.2)
Total Protein: 7.2 g/dL (ref 6.5–8.1)

## 2015-10-03 LAB — URINALYSIS COMPLETE WITH MICROSCOPIC (ARMC ONLY)
Bilirubin Urine: NEGATIVE
GLUCOSE, UA: NEGATIVE mg/dL
HGB URINE DIPSTICK: NEGATIVE
Ketones, ur: NEGATIVE mg/dL
Nitrite: POSITIVE — AB
PH: 5 (ref 5.0–8.0)
Protein, ur: 30 mg/dL — AB
Specific Gravity, Urine: 1.012 (ref 1.005–1.030)

## 2015-10-03 LAB — TROPONIN I

## 2015-10-03 MED ORDER — CEFTRIAXONE SODIUM 1 G IJ SOLR
1.0000 g | Freq: Once | INTRAMUSCULAR | Status: AC
  Administered 2015-10-03: 1 g via INTRAVENOUS
  Filled 2015-10-03: qty 10

## 2015-10-03 NOTE — ED Notes (Signed)
Yellow fall braclet placed on patients right wrist.

## 2015-10-03 NOTE — ED Triage Notes (Signed)
Family reports patient with history of ahlizmer"s and that she is more altered than normal and that this happened approximately a month ago when she had a uti.

## 2015-10-03 NOTE — ED Provider Notes (Signed)
[facility] Emergency Department Provider Note  ____________________________________________  Time seen: Approximately 10:16 PM  I have reviewed the triage vital signs and the nursing notes.   HISTORY  Chief Complaint Altered Mental Status    HPI Numa Heatwole is a 82 y.o. female who lives at home with her husband with a history of Alzheimer's dementia, recurrent UTI,COPD on 2 L nasal cannula baseline, CHF, CAD, CKD brought by her daughter for altered mental status and dysuria. The patient's daughter reports a baseline dementia, but over the past 2 days the patient has had significantly worsening memory and recognition. She is confused as to her husband's, which is not baseline. She has also had decreased appetite with decreased water intake. No fevers or chills. No nausea vomiting or diarrhea   Past Medical History:  Diagnosis Date  . Alzheimer disease   . Anxiety   . CAD (coronary artery disease)    h/o NSTEMI, LAD stent placement 10/04, cath 05/11/2010  . Carotid artery occlusion   . COPD (chronic obstructive pulmonary disease) ([place])   . Depression   . Diastolic dysfunction    Echo 05/11/2010, EF 65-70%  . HLD (hyperlipidemia)   . HTN (hypertension)   . Hypertension   . Kidney infection   . PMR (polymyalgia rheumatica) (HCC)    with h/o steroid induced myopathy  . Thyroid disease   . TIA (transient ischemic attack)    11/05 s/p L CEA    Patient Active Problem List   Diagnosis Date Noted  . Hypotension 05/05/2015  . Pedal edema 05/05/2015  . COPD exacerbation ([place]) 04/25/2015  . Left lower lobe pneumonia 04/25/2015  . Chest pain 02/13/2015  . Allergic rhinitis 11/04/2014  . Chronic diastolic heart failure ([place]) 07/22/2014  . CKD (chronic kidney disease) stage 3, GFR 30-59 ml/min 06/11/2014  . Carotid artery disease ([place]) 04/08/2014  . Nevus of cheek 10/30/2013  . CAD (coronary artery disease)   . HLD (hyperlipidemia)   . Lung nodule  01/15/2013  . Memory loss 01/15/2013  . Weakness generalized 12/26/2012  . Incontinence 12/26/2012  . GERD (gastroesophageal reflux disease) 09/06/2012  . Neuropathy ([place]) 01/14/2011  . Insomnia 01/14/2011  . MELAS (mitochondrial encephalopathy, lactic acidosis and stroke-like episodes) ([place]) 11/02/2010  . HTN (hypertension)   . COPD (chronic obstructive pulmonary disease) ([place])   . Depression   . Thyroid disease     Past Surgical History:  Procedure Laterality Date  . CARDIAC CATHETERIZATION     unc  . CARDIAC CATHETERIZATION    . CARDIAC CATHETERIZATION    . CAROTID ENDARTERECTOMY     left with stent placement 11/2003  . ILIAC ARTERY STENT     right, 2001  . LUNG BIOPSY      Current Outpatient Rx  . Order #: [id number] Class: Normal  . Order #: [id number] Class: Print  . Order #: [id number] Class: Normal  . Order #: [id number] Class: Normal  . Order #: [id number] Class: Normal  . Order #: [id number] Class: Print  . Order #: [id number] Class: Normal  . Order #: [id number] Class: Normal  . Order #: [id number] Class: Normal  . Order #: [id number] Class: Historical Med  . Order #: [id number] Class: Normal  . Order #: [id number] Class: Historical Med  . Order #: [id number] Class: Normal  . Order #: [id number] Class: Normal  . Order #: [id number] Class: Normal  . Order #: [id number] Class: Normal  . Order #: [id number] Class: Historical Med  . Order #: [id number] Class: Historical Med    Allergies Codeine; Prednisone; and Shellfish allergy  No family  history on file.  Social History Social History  Substance Use Topics  . Smoking status: Former Smoker    Packs/day: 1.00    Years: 25.00    Types: Cigarettes    Quit date: 01/05/1996  . Smokeless tobacco: Never Used  . Alcohol use No    Review of Systems Constitutional: No fever/chills.As of altered mental status. Eyes: No visual changes. ENT: No sore throat. No congestion or rhinorrhea. Cardiovascular: Denies chest pain. Denies  palpitations. Respiratory: Denies shortness of breath.  No cough. Gastrointestinal: Positive diffuse abdominal discomfort.  No nausea, no vomiting.  No diarrhea.  No constipation. Genitourinary: Positive for dysuria. Musculoskeletal: Negative for back pain. Skin: Negative for rash. Neurological: Negative for headaches. No focal numbness, tingling or weakness.   10-point ROS otherwise negative.  ____________________________________________   PHYSICAL EXAM:  VITAL SIGNS: ED Triage Vitals [10/03/15 1922]  Enc Vitals Group     BP (!) 146/51     Pulse Rate 71     Resp (!) 22     Temp 97.9 F (36.6 C)     Temp Source Oral     SpO2 91 %     Weight 200 lb (90.7 kg)     Height 5\' 4"  (1.626 m)     Head Circumference      Peak Flow      Pain Score      Pain Loc      Pain Edu?      Excl. in [place]?     Constitutional: Patient is alert to person and place but not time. She is chronically ill appearing but nontoxic. Eyes: Conjunctivae are normal.  EOMI. No scleral icterus. Head: Atraumatic. Nose: No congestion/rhinnorhea. Mouth/Throat: Mucous membranes are moist.  Neck: No stridor.  Supple.  No JVD. No meningismus. Cardiovascular: Normal rate, regular rhythm. No murmurs, rubs or gallops.  Respiratory: Normal respiratory effort.  No accessory muscle use or retractions. Lungs CTAB.  No wheezes, rales or ronchi. Gastrointestinal: Obese. Soft and nondistended. Mild tenderness to palpation diffusely in the abdomen without focality. No guarding or rebound.  No peritoneal signs. Musculoskeletal: No LE edema.  Neurologic:  Alert.  Speech is clear.  Face and smile are symmetric.  EOMI.  Moves all extremities well. Skin:  Skin is warm, dry and intact. No rash noted. Psychiatric: Mood and affect are normal. Speech and behavior are normal.  Normal judgement.  ____________________________________________   LABS (all labs ordered are listed, but only abnormal results are displayed)  Labs  Reviewed  COMPREHENSIVE METABOLIC PANEL - Abnormal; Notable for the following:       Result Value   Glucose, Bld 162 (*)    BUN 31 (*)    Creatinine, Ser 1.52 (*)    Calcium 8.8 (*)    ALT 9 (*)    Alkaline Phosphatase 139 (*)    GFR calc non Af Amer 31 (*)    GFR calc Af Amer 36 (*)    All other components within normal limits  CBC - Abnormal; Notable for the following:    Hemoglobin 11.8 (*)    RDW 16.9 (*)    All other components within normal limits  URINALYSIS COMPLETEWITH MICROSCOPIC ([facility] ONLY) - Abnormal; Notable for the following:    Color, Urine YELLOW (*)    APPearance TURBID (*)    Protein, ur 30 (*)    Nitrite POSITIVE (*)    Leukocytes, UA 3+ (*)    Bacteria, UA MANY (*)  Squamous Epithelial / LPF TOO NUMEROUS TO COUNT (*)    All other components within normal limits  URINE CULTURE  CULTURE, BLOOD (ROUTINE X 2)  CULTURE, BLOOD (ROUTINE X 2)   ____________________________________________  EKG   EKG ordered but not complete at the time of admission. ____________________________________________  RADIOLOGY  No results found.  ____________________________________________   PROCEDURES  Procedure(s) performed: None  Procedures  Critical Care performed: No ____________________________________________   INITIAL IMPRESSION / ASSESSMENT AND PLAN / ED COURSE  Pertinent labs & imaging results that were available during my care of the patient were reviewed by me and considered in my medical decision making (see chart for details).  82 y.o. female with a history of dementia and recurrent UTIs presenting with altered mental status with dysuria. Overall, the patient's clinical picture is most consistent with a urinary tract infection resulting in altered mental status. Her creatinine is abnormal at baseline and is unchanged from baseline. I have ordered Rocephin, based on her last microbiology results. She will also receive IV fluids, and be admitted to the  hospital for continued treatment and evaluation.  ____________________________________________  FINAL CLINICAL IMPRESSION(S) / ED DIAGNOSES  Final diagnoses:  UTI (lower urinary tract infection)  Altered mental status, unspecified altered mental status type    Clinical Course      NEW MEDICATIONS STARTED DURING THIS VISIT:  New Prescriptions   No medications on file      Eula Listen, MD 10/03/15 2358

## 2015-10-04 DIAGNOSIS — N390 Urinary tract infection, site not specified: Secondary | ICD-10-CM | POA: Diagnosis present

## 2015-10-04 DIAGNOSIS — R4182 Altered mental status, unspecified: Secondary | ICD-10-CM | POA: Diagnosis present

## 2015-10-04 DIAGNOSIS — I10 Essential (primary) hypertension: Secondary | ICD-10-CM | POA: Diagnosis not present

## 2015-10-04 DIAGNOSIS — R410 Disorientation, unspecified: Secondary | ICD-10-CM | POA: Diagnosis not present

## 2015-10-04 DIAGNOSIS — J449 Chronic obstructive pulmonary disease, unspecified: Secondary | ICD-10-CM | POA: Diagnosis not present

## 2015-10-04 LAB — MRSA PCR SCREENING: MRSA by PCR: NEGATIVE

## 2015-10-04 MED ORDER — VITAMIN D (ERGOCALCIFEROL) 1.25 MG (50000 UNIT) PO CAPS
50000.0000 [IU] | ORAL_CAPSULE | ORAL | Status: DC
  Administered 2015-10-06: 50000 [IU] via ORAL
  Filled 2015-10-04: qty 1

## 2015-10-04 MED ORDER — ALBUTEROL SULFATE (2.5 MG/3ML) 0.083% IN NEBU: 3.0000 mL | INHALATION_SOLUTION | Freq: Four times a day (QID) | RESPIRATORY_TRACT | Status: DC | PRN

## 2015-10-04 MED ORDER — ACETAMINOPHEN 650 MG RE SUPP: 650.0000 mg | Freq: Four times a day (QID) | RECTAL | Status: DC | PRN

## 2015-10-04 MED ORDER — CLOPIDOGREL BISULFATE 75 MG PO TABS
75.0000 mg | ORAL_TABLET | Freq: Every day | ORAL | Status: DC
  Administered 2015-10-04 – 2015-10-06 (×3): 75 mg via ORAL
  Filled 2015-10-04 (×3): qty 1

## 2015-10-04 MED ORDER — SODIUM CHLORIDE 0.9 % IV SOLN
INTRAVENOUS | Status: DC
  Administered 2015-10-04 (×2): via INTRAVENOUS

## 2015-10-04 MED ORDER — SENNOSIDES-DOCUSATE SODIUM 8.6-50 MG PO TABS: 1.0000 | ORAL_TABLET | Freq: Every evening | ORAL | Status: DC | PRN

## 2015-10-04 MED ORDER — GABAPENTIN 400 MG PO CAPS
800.0000 mg | ORAL_CAPSULE | Freq: Two times a day (BID) | ORAL | Status: DC
  Administered 2015-10-04 – 2015-10-06 (×5): 800 mg via ORAL
  Filled 2015-10-04 (×5): qty 2

## 2015-10-04 MED ORDER — LISINOPRIL 10 MG PO TABS
10.0000 mg | ORAL_TABLET | Freq: Every day | ORAL | Status: DC
  Administered 2015-10-05 – 2015-10-06 (×2): 10 mg via ORAL
  Filled 2015-10-04 (×3): qty 1

## 2015-10-04 MED ORDER — SODIUM CHLORIDE 0.9 % IV SOLN: 250.0000 mL | INTRAVENOUS | Status: DC | PRN

## 2015-10-04 MED ORDER — AMLODIPINE BESYLATE 5 MG PO TABS
5.0000 mg | ORAL_TABLET | Freq: Every day | ORAL | Status: DC
  Administered 2015-10-05 – 2015-10-06 (×2): 5 mg via ORAL
  Filled 2015-10-04 (×3): qty 1

## 2015-10-04 MED ORDER — SERTRALINE HCL 100 MG PO TABS
100.0000 mg | ORAL_TABLET | Freq: Every day | ORAL | Status: DC
  Administered 2015-10-04 – 2015-10-06 (×3): 100 mg via ORAL
  Filled 2015-10-04 (×3): qty 1

## 2015-10-04 MED ORDER — ONDANSETRON HCL 4 MG/2ML IJ SOLN: 4.0000 mg | Freq: Four times a day (QID) | INTRAMUSCULAR | Status: DC | PRN

## 2015-10-04 MED ORDER — ENOXAPARIN SODIUM 40 MG/0.4ML ~~LOC~~ SOLN
40.0000 mg | Freq: Every day | SUBCUTANEOUS | Status: DC
  Administered 2015-10-04 – 2015-10-06 (×3): 40 mg via SUBCUTANEOUS
  Filled 2015-10-04 (×3): qty 0.4

## 2015-10-04 MED ORDER — DEXTROSE 5 % IV SOLN
1.0000 g | INTRAVENOUS | Status: DC
  Administered 2015-10-04 – 2015-10-05 (×2): 1 g via INTRAVENOUS
  Filled 2015-10-04 (×3): qty 10

## 2015-10-04 MED ORDER — SODIUM CHLORIDE 0.9% FLUSH
3.0000 mL | Freq: Two times a day (BID) | INTRAVENOUS | Status: DC
  Administered 2015-10-05 – 2015-10-06 (×2): 3 mL via INTRAVENOUS

## 2015-10-04 MED ORDER — LEVOTHYROXINE SODIUM 25 MCG PO TABS
25.0000 ug | ORAL_TABLET | Freq: Every day | ORAL | Status: DC
  Administered 2015-10-04 – 2015-10-06 (×3): 25 ug via ORAL
  Filled 2015-10-04 (×3): qty 1

## 2015-10-04 MED ORDER — TIOTROPIUM BROMIDE MONOHYDRATE 18 MCG IN CAPS
18.0000 ug | ORAL_CAPSULE | Freq: Every day | RESPIRATORY_TRACT | Status: DC
  Administered 2015-10-04 – 2015-10-06 (×3): 18 ug via RESPIRATORY_TRACT
  Filled 2015-10-04: qty 5

## 2015-10-04 MED ORDER — VITAMIN B-12 1000 MCG PO TABS
1000.0000 ug | ORAL_TABLET | Freq: Every day | ORAL | Status: DC
  Administered 2015-10-04 – 2015-10-06 (×3): 1000 ug via ORAL
  Filled 2015-10-04 (×3): qty 1

## 2015-10-04 MED ORDER — ATORVASTATIN CALCIUM 20 MG PO TABS
40.0000 mg | ORAL_TABLET | Freq: Every day | ORAL | Status: DC
  Administered 2015-10-04 – 2015-10-06 (×3): 40 mg via ORAL
  Filled 2015-10-04 (×3): qty 2

## 2015-10-04 MED ORDER — ACETAMINOPHEN 325 MG PO TABS
650.0000 mg | ORAL_TABLET | Freq: Four times a day (QID) | ORAL | Status: DC | PRN
  Administered 2015-10-05: 650 mg via ORAL
  Filled 2015-10-04: qty 2

## 2015-10-04 MED ORDER — MOMETASONE FURO-FORMOTEROL FUM 200-5 MCG/ACT IN AERO
2.0000 | INHALATION_SPRAY | Freq: Two times a day (BID) | RESPIRATORY_TRACT | Status: DC
  Administered 2015-10-04 – 2015-10-06 (×5): 2 via RESPIRATORY_TRACT
  Filled 2015-10-04: qty 8.8

## 2015-10-04 MED ORDER — NITROGLYCERIN 0.4 MG SL SUBL: 0.4000 mg | SUBLINGUAL_TABLET | SUBLINGUAL | Status: DC | PRN

## 2015-10-04 MED ORDER — ALBUTEROL SULFATE (2.5 MG/3ML) 0.083% IN NEBU: 2.5000 mg | INHALATION_SOLUTION | Freq: Four times a day (QID) | RESPIRATORY_TRACT | Status: DC | PRN

## 2015-10-04 MED ORDER — DEXTROSE 5 % IV SOLN
1.0000 g | INTRAVENOUS | Status: DC
  Filled 2015-10-04: qty 10

## 2015-10-04 MED ORDER — SODIUM CHLORIDE 0.9% FLUSH: 3.0000 mL | INTRAVENOUS | Status: DC | PRN

## 2015-10-04 MED ORDER — TRAZODONE HCL 50 MG PO TABS
50.0000 mg | ORAL_TABLET | Freq: Every day | ORAL | Status: DC
  Administered 2015-10-04 – 2015-10-05 (×2): 50 mg via ORAL
  Filled 2015-10-04 (×2): qty 1

## 2015-10-04 MED ORDER — ONDANSETRON HCL 4 MG PO TABS: 4.0000 mg | ORAL_TABLET | Freq: Four times a day (QID) | ORAL | Status: DC | PRN

## 2015-10-04 MED ORDER — FAMOTIDINE 20 MG PO TABS
10.0000 mg | ORAL_TABLET | Freq: Every day | ORAL | Status: DC
  Administered 2015-10-04 – 2015-10-06 (×3): 10 mg via ORAL
  Filled 2015-10-04 (×3): qty 1

## 2015-10-04 NOTE — Plan of Care (Signed)
Problem: Activity: Goal: Risk for activity intolerance will decrease Outcome: Progressing Encouraged to sit in chair this shift and was able to tolerate several hours up in chair without difficulty.

## 2015-10-04 NOTE — Clinical Social Work Note (Signed)
CSW received consult that patient admitted from [place]. CSW involvement only required if the patient is admitted from a skilled nursing facility, long-term care, assisted living, or a group home/family care home. CSW will follow in case any of these levels of care are indicated for dc.  Santiago Bumpers, MSW, LCSW-A [phone]

## 2015-10-04 NOTE — H&P (Signed)
[facility] [place] NAME: Joy [name]    MR#:  [id number]  DATE OF BIRTH:  [birth year]  DATE OF ADMISSION:  10/03/2015  PRIMARY CARE PHYSICIAN: Arnette Norris, MD   REQUESTING/REFERRING PHYSICIAN:   CHIEF COMPLAINT:   Chief Complaint  Patient presents with  . Altered Mental Status    HISTORY OF PRESENT ILLNESS: Joy [name]  is a 82 y.o. female with a known history of Alzheimer's dementia, coronary artery disease, COPD on home oxygen, diastolic dysfunction hyperlipidemia, hypertension presented to the emergency room after she was brought by the family for confusion. Patient has baseline dementia but according to the family member she is more confused. She lives with her husband at home and dependent on smell activities of daily living. Patient is awake and responds to verbal commands she remembers her age and also her date of birth. No complaints of any chest pain or shortness of breath. Patient gets swelling around the ankles on and off and currently on Lasix for diastolic dysfunction. Has burning sensation when she passes urine. No history of any fever or chills. Patient had frequent urinary tract infections in the past. No history of fall or head injury. Hospitalist service was consulted for further care of the patient.  PAST MEDICAL HISTORY:   Past Medical History:  Diagnosis Date  . Alzheimer disease   . Anxiety   . CAD (coronary artery disease)    h/o NSTEMI, LAD stent placement 10/04, cath 05/11/2010  . Carotid artery occlusion   . COPD (chronic obstructive pulmonary disease) ([place])   . Depression   . Diastolic dysfunction    Echo 05/11/2010, EF 65-70%  . HLD (hyperlipidemia)   . HTN (hypertension)   . Hypertension   . Kidney infection   . PMR (polymyalgia rheumatica) ([facility])    with h/o steroid induced myopathy  . Thyroid disease   . TIA (transient ischemic attack)    11/05 s/p L CEA    PAST SURGICAL HISTORY: Past  Surgical History:  Procedure Laterality Date  . CARDIAC CATHETERIZATION     unc  . CARDIAC CATHETERIZATION    . CARDIAC CATHETERIZATION    . CAROTID ENDARTERECTOMY     left with stent placement 11/2003  . ILIAC ARTERY STENT     right, 2001  . LUNG BIOPSY      SOCIAL HISTORY:  Social History  Substance Use Topics  . Smoking status: Former Smoker    Packs/day: 1.00    Years: 25.00    Types: Cigarettes    Quit date: 01/05/1996  . Smokeless tobacco: Never Used  . Alcohol use No    FAMILY HISTORY: No family history on file.  DRUG ALLERGIES:  Allergies  Allergen Reactions  . Codeine Other (See Comments)    Coma for 2 days  . Prednisone Other (See Comments)    Weight gain,puffy face  . Shellfish Allergy     REVIEW OF SYSTEMS:   CONSTITUTIONAL: No fever, has weakness.  EYES: No blurred or double vision.  EARS, NOSE, AND THROAT: No tinnitus or ear pain.  RESPIRATORY: No cough, shortness of breath, wheezing or hemoptysis.  CARDIOVASCULAR: No chest pain, orthopnea, edema.  GASTROINTESTINAL: No nausea, vomiting, diarrhea or abdominal pain.  GENITOURINARY: Has dysuria, no hematuria.  ENDOCRINE: No polyuria, nocturia,  HEMATOLOGY: No anemia, easy bruising or bleeding SKIN: No rash or lesion. MUSCULOSKELETAL: No joint pain or arthritis.   NEUROLOGIC: No tingling, numbness, weakness.  Has dementia PSYCHIATRY:  No anxiety or depression.   MEDICATIONS AT HOME:  Prior to Admission medications   Medication Sig Start Date End Date Taking? Authorizing Provider  ADVAIR DISKUS 250-50 MCG/DOSE AEPB INHALE 1 PUFF BY MOUTH TWICE A DAY 09/29/15  Yes Lucille Passy, MD  albuterol (PROVENTIL HFA;VENTOLIN HFA) 108 (90 Base) MCG/ACT inhaler Inhale 2 puffs into the lungs every 6 (six) hours as needed for wheezing or shortness of breath. 04/17/15  Yes Joanne Gavel, MD  albuterol (PROVENTIL) (2.5 MG/3ML) 0.083% nebulizer solution Take 3 mLs (2.5 mg total) by nebulization every 6 (six) hours as  needed for wheezing or shortness of breath. 04/28/15  Yes Lucille Passy, MD  amLODipine (NORVASC) 5 MG tablet Take 1 tablet (5 mg total) by mouth daily. 07/15/15  Yes Wellington Hampshire, MD  atorvastatin (LIPITOR) 40 MG tablet TAKE 1 TABLET BY MOUTH EVERY DAY 08/14/15  Yes Lucille Passy, MD  clopidogrel (PLAVIX) 75 MG tablet Take 1 tablet (75 mg total) by mouth daily. 05/22/15  Yes Lucille Passy, MD  furosemide (LASIX) 20 MG tablet Take 1 tablet (20 mg total) by mouth daily. 09/04/15 12/03/15 Yes Wellington Hampshire, MD  gabapentin (NEURONTIN) 800 MG tablet TAKE 1 TABLET BY MOUTH TWICE A DAY 05/05/15  Yes Lucille Passy, MD  levothyroxine (SYNTHROID, LEVOTHROID) 25 MCG tablet Take 25 mcg by mouth daily before breakfast.   Yes Historical Provider, MD  lisinopril (PRINIVIL,ZESTRIL) 10 MG tablet Take 1 tablet (10 mg total) by mouth daily. 07/15/15  Yes Wellington Hampshire, MD  nitroGLYCERIN (NITROSTAT) 0.4 MG SL tablet Place 0.4 mg under the tongue every 5 (five) minutes as needed.     Yes Historical Provider, MD  ranitidine (ZANTAC) 300 MG tablet TAKE 1 TABLET (300 MG TOTAL) BY MOUTH AT BEDTIME. 05/26/15  Yes Lucille Passy, MD  sertraline (ZOLOFT) 100 MG tablet TAKE 1 TABLET BY MOUTH EVERY DAY 10/03/15  Yes Lucille Passy, MD  SPIRIVA HANDIHALER 18 MCG inhalation capsule PLACE 1 CAPSULE (18 MCG TOTAL) INTO INHALER AND INHALE DAILY. 06/03/15  Yes Lucille Passy, MD  traZODone (DESYREL) 50 MG tablet TAKE 1/2 TO 1 TABLET AT BEDTIME AS NEEDED FOR SLEEP 07/10/15  Yes Lucille Passy, MD  vitamin B-12 (CYANOCOBALAMIN) 1000 MCG tablet Take 1,000 mcg by mouth daily.   Yes Historical Provider, MD  Vitamin D, Ergocalciferol, (DRISDOL) 50000 units CAPS capsule Take 50,000 Units by mouth every 7 (seven) days.   Yes Historical Provider, MD      PHYSICAL EXAMINATION:   VITAL SIGNS: Blood pressure (!) 119/99, pulse 63, temperature 97.9 F (36.6 C), temperature source Oral, resp. rate 14, height 5\' 4"  (1.626 m), weight 90.7 kg (200 lb), SpO2 96  %.  GENERAL:  82 y.o.-year-old patient lying in the bed with no acute distress.  EYES: Pupils equal, round, reactive to light and accommodation. No scleral icterus. Extraocular muscles intact.  HEENT: Head atraumatic, normocephalic. Oropharynx and nasopharynx clear.  NECK:  Supple, no jugular venous distention. No thyroid enlargement, no tenderness.  LUNGS: Normal breath sounds bilaterally, no wheezing, rales,rhonchi or crepitation. No use of accessory muscles of respiration.  CARDIOVASCULAR: S1, S2 normal. No murmurs, rubs, or gallops.  ABDOMEN: Soft, nontender, nondistended. Bowel sounds present. No organomegaly or mass.  EXTREMITIES: trace pedal edema noted,  No cyanosis, or clubbing.  NEUROLOGIC: Cranial nerves II through XII are intact. Muscle strength 5/5 in all extremities. Sensation intact. Gait not checked.  PSYCHIATRIC: The patient is alert  and oriented x 2 SKIN: No obvious rash, lesion, or ulcer.   LABORATORY PANEL:   CBC  Recent Labs Lab 10/03/15 1927  WBC 6.5  HGB 11.8*  HCT 35.3  PLT 164  MCV 91.6  MCH 30.6  MCHC 33.5  RDW 16.9*   ------------------------------------------------------------------------------------------------------------------  Chemistries   Recent Labs Lab 10/03/15 1927  NA 144  K 3.9  CL 104  CO2 32  GLUCOSE 162*  BUN 31*  CREATININE 1.52*  CALCIUM 8.8*  AST 17  ALT 9*  ALKPHOS 139*  BILITOT 0.4   ------------------------------------------------------------------------------------------------------------------ estimated creatinine clearance is 31.1 mL/min (by C-G formula based on SCr of 1.52 mg/dL (H)). ------------------------------------------------------------------------------------------------------------------ No results for input(s): TSH, T4TOTAL, T3FREE, THYROIDAB in the last 72 hours.  Invalid input(s): FREET3   Coagulation profile No results for input(s): INR, PROTIME in the last 168  hours. ------------------------------------------------------------------------------------------------------------------- No results for input(s): DDIMER in the last 72 hours. -------------------------------------------------------------------------------------------------------------------  Cardiac Enzymes  Recent Labs Lab 10/03/15 1927  TROPONINI <0.03   ------------------------------------------------------------------------------------------------------------------ Invalid input(s): POCBNP  ---------------------------------------------------------------------------------------------------------------  Urinalysis    Component Value Date/Time   COLORURINE YELLOW (A) 10/03/2015 1927   APPEARANCEUR TURBID (A) 10/03/2015 1927   APPEARANCEUR Hazy 04/17/2014 2310   LABSPEC 1.012 10/03/2015 1927   LABSPEC 1.017 04/17/2014 2310   PHURINE 5.0 10/03/2015 1927   GLUCOSEU NEGATIVE 10/03/2015 1927   GLUCOSEU Negative 04/17/2014 2310   HGBUR NEGATIVE 10/03/2015 1927   BILIRUBINUR NEGATIVE 10/03/2015 1927   BILIRUBINUR neg 07/10/2015 1238   BILIRUBINUR Negative 04/17/2014 2310   KETONESUR NEGATIVE 10/03/2015 1927   PROTEINUR 30 (A) 10/03/2015 1927   UROBILINOGEN 0.2 07/10/2015 1238   NITRITE POSITIVE (A) 10/03/2015 1927   LEUKOCYTESUR 3+ (A) 10/03/2015 1927   LEUKOCYTESUR 3+ 04/17/2014 2310     RADIOLOGY: No results found.  EKG: Orders placed or performed during the hospital encounter of 10/03/15  . ED EKG  . ED EKG    IMPRESSION AND PLAN: 82 year old elderly female patient with history of Alzheimer's dementia, diastolic dysfunction, hypertension, hyperlipidemia, COPD on home oxygen presented to the emergency room with dysuria and confusion. Admitting diagnosis 1. Disorientation secondary to urinary tract infection 2. Urinary tract infection 3. Alzheimer dementia 4. Hypertension 5. COPD on home oxygen Treatment plan Admit patient to medical floor observation bed IV  Rocephin antibiotic 1 g daily Gentle IV fluid hydration DVT prophylaxis subcutaneous Lovenox 40 MG daily Resume cardiac medications Supportive care.  All the records are reviewed and case discussed with ED provider. Management plans discussed with the patient, family and they are in agreement.  CODE STATUS:FULL Code Status History    This patient does not have a recorded code status. Please follow your organizational policy for patients in this situation.       TOTAL TIME TAKING CARE OF THIS PATIENT: 52 minutes.    Saundra Shelling M.D on 10/04/2015 at 3:11 AM  Between 7am to 6pm - Pager - [phone]  After 6pm go to [email or web address] - password [email or web address] [facility]  Office  [phone]  CC: Primary care physician; Arnette Norris, MD

## 2015-10-04 NOTE — Progress Notes (Signed)
Admitted for altered mental status., UTI. Started on IV Rocephin. Patient is alert, awake at this time. Waiting for urine culture data  to adjust antibiotics. Acute renal failure on IV hydration. Monitor kidney function closely. Patient is alert, awake, oriented. Encourage out of bed to chair/ Time spent is 50 minutes.

## 2015-10-04 NOTE — Care Management Obs Status (Signed)
[facility] NOTIFICATION   Patient Details  Name: Joy [name] MRN: [id number] Date of Birth: 01-31-1930   Medicare Observation Status Notification Given:  Yes (signed by daughter)    Ival Bible, RN 10/04/2015, 4:04 PM

## 2015-10-04 NOTE — Progress Notes (Signed)
Pharmacy Antibiotic Note  Joy [name] is a 82 y.o. female admitted on 10/03/2015 with UTI.  Pharmacy has been consulted for ceftriaxone dosing.  Plan: Ceftriaxone 1 gm IV Q24H  Height: 5\' 4"  (162.6 cm) Weight: 200 lb (90.7 kg) IBW/kg (Calculated) : 54.7  Temp (24hrs), Avg:97.9 F (36.6 C), Min:97.9 F (36.6 C), Max:97.9 F (36.6 C)   Recent Labs Lab 10/03/15 1927  WBC 6.5  CREATININE 1.52*    Estimated Creatinine Clearance: 31.1 mL/min (by C-G formula based on SCr of 1.52 mg/dL (H)).    Allergies  Allergen Reactions  . Codeine Other (See Comments)    Coma for 2 days  . Prednisone Other (See Comments)    Weight gain,puffy face  . Shellfish Allergy      Thank you for allowing pharmacy to be a part of this patient's care.  Laural Benes, Pharm.D., BCPS Clinical Pharmacist 10/04/2015 3:27 AM

## 2015-10-05 DIAGNOSIS — I130 Hypertensive heart and chronic kidney disease with heart failure and stage 1 through stage 4 chronic kidney disease, or unspecified chronic kidney disease: Secondary | ICD-10-CM | POA: Diagnosis not present

## 2015-10-05 DIAGNOSIS — E785 Hyperlipidemia, unspecified: Secondary | ICD-10-CM | POA: Diagnosis not present

## 2015-10-05 DIAGNOSIS — F419 Anxiety disorder, unspecified: Secondary | ICD-10-CM | POA: Diagnosis not present

## 2015-10-05 DIAGNOSIS — K5900 Constipation, unspecified: Secondary | ICD-10-CM | POA: Diagnosis not present

## 2015-10-05 DIAGNOSIS — J449 Chronic obstructive pulmonary disease, unspecified: Secondary | ICD-10-CM | POA: Diagnosis not present

## 2015-10-05 DIAGNOSIS — M353 Polymyalgia rheumatica: Secondary | ICD-10-CM | POA: Diagnosis not present

## 2015-10-05 DIAGNOSIS — I252 Old myocardial infarction: Secondary | ICD-10-CM | POA: Diagnosis not present

## 2015-10-05 DIAGNOSIS — G629 Polyneuropathy, unspecified: Secondary | ICD-10-CM | POA: Diagnosis not present

## 2015-10-05 DIAGNOSIS — B9689 Other specified bacterial agents as the cause of diseases classified elsewhere: Secondary | ICD-10-CM | POA: Diagnosis not present

## 2015-10-05 DIAGNOSIS — I10 Essential (primary) hypertension: Secondary | ICD-10-CM | POA: Diagnosis not present

## 2015-10-05 DIAGNOSIS — G9341 Metabolic encephalopathy: Secondary | ICD-10-CM | POA: Diagnosis not present

## 2015-10-05 DIAGNOSIS — F0280 Dementia in other diseases classified elsewhere without behavioral disturbance: Secondary | ICD-10-CM | POA: Diagnosis not present

## 2015-10-05 DIAGNOSIS — N390 Urinary tract infection, site not specified: Secondary | ICD-10-CM | POA: Diagnosis not present

## 2015-10-05 DIAGNOSIS — F329 Major depressive disorder, single episode, unspecified: Secondary | ICD-10-CM | POA: Diagnosis not present

## 2015-10-05 DIAGNOSIS — K219 Gastro-esophageal reflux disease without esophagitis: Secondary | ICD-10-CM | POA: Diagnosis not present

## 2015-10-05 DIAGNOSIS — Z79899 Other long term (current) drug therapy: Secondary | ICD-10-CM | POA: Diagnosis not present

## 2015-10-05 DIAGNOSIS — G309 Alzheimer's disease, unspecified: Secondary | ICD-10-CM | POA: Diagnosis not present

## 2015-10-05 DIAGNOSIS — Z8673 Personal history of transient ischemic attack (TIA), and cerebral infarction without residual deficits: Secondary | ICD-10-CM | POA: Diagnosis not present

## 2015-10-05 DIAGNOSIS — N183 Chronic kidney disease, stage 3 (moderate): Secondary | ICD-10-CM | POA: Diagnosis not present

## 2015-10-05 DIAGNOSIS — I2510 Atherosclerotic heart disease of native coronary artery without angina pectoris: Secondary | ICD-10-CM | POA: Diagnosis not present

## 2015-10-05 DIAGNOSIS — Z87891 Personal history of nicotine dependence: Secondary | ICD-10-CM | POA: Diagnosis not present

## 2015-10-05 DIAGNOSIS — I5032 Chronic diastolic (congestive) heart failure: Secondary | ICD-10-CM | POA: Diagnosis not present

## 2015-10-05 DIAGNOSIS — E079 Disorder of thyroid, unspecified: Secondary | ICD-10-CM | POA: Diagnosis not present

## 2015-10-05 DIAGNOSIS — R410 Disorientation, unspecified: Secondary | ICD-10-CM | POA: Diagnosis not present

## 2015-10-05 DIAGNOSIS — N170 Acute kidney failure with tubular necrosis: Secondary | ICD-10-CM | POA: Diagnosis not present

## 2015-10-05 DIAGNOSIS — R4182 Altered mental status, unspecified: Secondary | ICD-10-CM | POA: Diagnosis present

## 2015-10-05 DIAGNOSIS — Z7902 Long term (current) use of antithrombotics/antiplatelets: Secondary | ICD-10-CM | POA: Diagnosis not present

## 2015-10-05 LAB — BASIC METABOLIC PANEL
Anion gap: 5 (ref 5–15)
BUN: 21 mg/dL — ABNORMAL HIGH (ref 6–20)
CHLORIDE: 110 mmol/L (ref 101–111)
CO2: 27 mmol/L (ref 22–32)
CREATININE: 1.25 mg/dL — AB (ref 0.44–1.00)
Calcium: 8.4 mg/dL — ABNORMAL LOW (ref 8.9–10.3)
GFR calc non Af Amer: 39 mL/min — ABNORMAL LOW (ref >60)
GFR, EST AFRICAN AMERICAN: 45 mL/min — AB (ref >60)
Glucose, Bld: 101 mg/dL — ABNORMAL HIGH (ref 65–99)
Potassium: 4.1 mmol/L (ref 3.5–5.1)
Sodium: 142 mmol/L (ref 135–145)

## 2015-10-05 NOTE — Progress Notes (Signed)
[facility] NAME: Joy [name]    MR#:  [id number]  DATE OF BIRTH:  11-07-1930  SUBJECTIVE: Patient admitted for altered mental status, found to have UTI. Today she feels better, urine cultures are still pending   CHIEF COMPLAINT:   Chief Complaint  Patient presents with  . Altered Mental Status    REVIEW OF SYSTEMS:    Review of Systems  Constitutional: Negative for chills and fever.  HENT: Negative for hearing loss.   Eyes: Negative for blurred vision, double vision and photophobia.  Respiratory: Negative for cough, hemoptysis and shortness of breath.   Cardiovascular: Negative for palpitations, orthopnea and leg swelling.  Gastrointestinal: Negative for abdominal pain, diarrhea and vomiting.  Genitourinary: Negative for dysuria and urgency.  Musculoskeletal: Negative for myalgias and neck pain.  Skin: Negative for rash.  Neurological: Negative for dizziness, focal weakness, seizures, weakness and headaches.  Psychiatric/Behavioral: Negative for memory loss. The patient does not have insomnia.     Nutrition: Tolerating Diet: Tolerating PT:      DRUG ALLERGIES:   Allergies  Allergen Reactions  . Codeine Other (See Comments)    Coma for 2 days  . Prednisone Other (See Comments)    Weight gain,puffy face  . Shellfish Allergy     VITALS:  Blood pressure (!) 131/45, pulse (!) 58, temperature 98.6 F (37 C), temperature source Oral, resp. rate 17, height 5\' 4"  (1.626 m), weight 90.7 kg (200 lb), SpO2 96 %.  PHYSICAL EXAMINATION:   Physical Exam  GENERAL:  81 y.o.-year-old patient lying in the bed with no acute distress.  EYES: Pupils equal, round, reactive to light and accommodation. No scleral icterus. Extraocular muscles intact.  HEENT: Head atraumatic, normocephalic. Oropharynx and nasopharynx clear.  NECK:  Supple, no jugular venous distention. No thyroid enlargement, no tenderness.  LUNGS:  Normal breath sounds bilaterally, no wheezing, rales,rhonchi or crepitation. No use of accessory muscles of respiration.  CARDIOVASCULAR: S1, S2 normal. No murmurs, rubs, or gallops.  ABDOMEN: Soft, nontender, nondistended. Bowel sounds present. No organomegaly or mass.  EXTREMITIES: No pedal edema, cyanosis, or clubbing.  NEUROLOGIC: Cranial nerves II through XII are intact. Muscle strength 5/5 in all extremities. Sensation intact. Gait not checked.  PSYCHIATRIC: The patient is alert and oriented x 3.  SKIN: No obvious rash, lesion, or ulcer.    LABORATORY PANEL:   CBC  Recent Labs Lab 10/03/15 1927  WBC 6.5  HGB 11.8*  HCT 35.3  PLT 164   ------------------------------------------------------------------------------------------------------------------  Chemistries   Recent Labs Lab 10/03/15 1927 10/05/15 0926  NA 144 142  K 3.9 4.1  CL 104 110  CO2 32 27  GLUCOSE 162* 101*  BUN 31* 21*  CREATININE 1.52* 1.25*  CALCIUM 8.8* 8.4*  AST 17  --   ALT 9*  --   ALKPHOS 139*  --   BILITOT 0.4  --    ------------------------------------------------------------------------------------------------------------------  Cardiac Enzymes  Recent Labs Lab 10/03/15 1927  TROPONINI <0.03   ------------------------------------------------------------------------------------------------------------------  RADIOLOGY:  No results found.   ASSESSMENT AND PLAN:   Principal Problem:   UTI (lower urinary tract infection) Active Problems:   Altered mental state  #1 altered mental status. Metabolic encephalopathy secondary to UTI: Improved, alert, awake, oriented. #2 UTI present on admission: Follow urine cultures, continue Rocephin.   urine  culture showed more than 100,000 colonies of gram-negative rods. #3 acute renal failure secondary to prerenal from ATN: Improved with IV hydration. #  4 physical therapy consult today. #5 constipation: Continue stool softners,. D/w  daughter     All the records are reviewed and case discussed with Care Management/Social Workerr. Management plans discussed with the patient, family and they are in agreement.  CODE STATUS: full  TOTAL TIME TAKING CARE OF THIS PATIENT: 35 minutes.   POSSIBLE D/C IN 1-2DAYS, DEPENDING ON CLINICAL CONDITION.   Epifanio Lesches M.D on 10/05/2015 at 11:13 AM  Between 7am to 6pm - Pager - [phone]  After 6pm go to [email or web address] - password [email or web address] [facility]  Office  [phone]  CC: Primary care physician; Arnette Norris, MD

## 2015-10-05 NOTE — Evaluation (Signed)
Physical Therapy Evaluation Patient Details Name: Joy [name] MRN: [id number] DOB: [birth year] Today's Date: 10/05/2015   History of Present Illness  82 y.o. female with a known history of Alzheimer's dementia, coronary artery disease, COPD on home oxygen, diastolic dysfunction hyperlipidemia, hypertension.  She had worse than normal confusion and found to have UTI.  Clinical Impression  Pt is able to ambulate well with walker and with single HHA.  She typically uses a cane and this is likely appropriate for next PT visit for longer bout of ambulation if her O2 improves.  She was on 2 liters O2 t/o the session and generally was in the high 80s/low 90s the entire time.  Pt did well with bed mobility and transfers and though she had some confusion she ultimately did well.     Follow Up Recommendations Home health PT    Equipment Recommendations       Recommendations for Other Services       Precautions / Restrictions Precautions Precautions: Fall Restrictions Weight Bearing Restrictions: No      Mobility  Bed Mobility Overal bed mobility: Modified Independent             General bed mobility comments: Pt did well getting herself up to sitting with only minimal cuing and CGA  Transfers Overall transfer level: Modified independent Equipment used: Rolling walker (2 wheeled)                Ambulation/Gait Ambulation/Gait assistance: Min guard Ambulation Distance (Feet): 30 Feet Assistive device: 1 person hand held assist;Rolling walker (2 wheeled)       General Gait Details: Pt did very well with ~15 ft of ambulation with walker showing no safety concerns, LOBs, etc.  She was then able to walk another 15 ft with light HHA.  Pt on 2 liters O2 t/o the effort but her sats remain in the low 90s and she does have some fatigue with the light effort.  Pt likely could havew walked considerably more if her O2 status was better.  Stairs            Wheelchair  Mobility    Modified Rankin (Stroke Patients Only)       Balance Overall balance assessment: Modified Independent                                           Pertinent Vitals/Pain Pain Assessment: No/denies pain    Home Living Family/patient expects to be discharged to::  (return to [facility])                      Prior Function Level of Independence: Independent with assistive device(s)         Comments: Pt reports that she does not get out too much, but feels okay using the cane in the home.      Hand Dominance        Extremity/Trunk Assessment   Upper Extremity Assessment: Overall WFL for tasks assessed           Lower Extremity Assessment: Overall WFL for tasks assessed         Communication   Communication: No difficulties  Cognition Arousal/Alertness: Awake/alert Behavior During Therapy: WFL for tasks assessed/performed Overall Cognitive Status: History of cognitive impairments - at baseline (unsure of true baseline, but pt able to participate well)  General Comments      Exercises     Assessment/Plan    PT Assessment Patient needs continued PT services  PT Problem List Decreased activity tolerance;Cardiopulmonary status limiting activity;Decreased cognition          PT Treatment Interventions Gait training;Functional mobility training;Therapeutic activities;Therapeutic exercise;Balance training    PT Goals (Current goals can be found in the Care Plan section)  Acute Rehab PT Goals Patient Stated Goal: go home PT Goal Formulation: With patient Time For Goal Achievement: 10/19/15 Potential to Achieve Goals: Good    Frequency Min 2X/week   Barriers to discharge        Co-evaluation               End of Session Equipment Utilized During Treatment: Gait belt;Oxygen (2 liters) Activity Tolerance: Patient limited by fatigue Patient left: with bed alarm set;with call  bell/phone within reach      Functional Assessment Tool Used: clinical judgement Functional Limitation: Mobility: Walking and moving around Mobility: Walking and Moving Around Current Status (X3818): At least 20 percent but less than 40 percent impaired, limited or restricted Mobility: Walking and Moving Around Goal Status [phone]): At least 1 percent but less than 20 percent impaired, limited or restricted    Time: 1535-1555 PT Time Calculation (min) (ACUTE ONLY): 20 min   Charges:   PT Evaluation $PT Eval Low Complexity: 1 Procedure     PT G Codes:   PT G-Codes **NOT FOR INPATIENT CLASS** Functional Assessment Tool Used: clinical judgement Functional Limitation: Mobility: Walking and moving around Mobility: Walking and Moving Around Current Status (J6967): At least 20 percent but less than 40 percent impaired, limited or restricted Mobility: Walking and Moving Around Goal Status [phone]): At least 1 percent but less than 20 percent impaired, limited or restricted    Kreg Shropshire, DPT 10/05/2015, 5:01 PM

## 2015-10-05 NOTE — Progress Notes (Signed)
PT Cancellation Note  Patient Details Name: Joy [name] MRN: [id number] DOB: 20-Feb-1930   Cancelled Treatment:    Reason Eval/Treat Not Completed: Other (comment) (in pain awaiting meds and lunch had arrived). Try later as time and pt allow.   Ramond Dial 10/05/2015, 12:52 PM    Mee Hives, PT MS Acute Rehab Dept. Number: [place] [facility] [place]

## 2015-10-06 ENCOUNTER — Encounter: Payer: Self-pay | Admitting: *Deleted

## 2015-10-06 ENCOUNTER — Telehealth: Payer: Self-pay

## 2015-10-06 DIAGNOSIS — G309 Alzheimer's disease, unspecified: Secondary | ICD-10-CM | POA: Diagnosis not present

## 2015-10-06 DIAGNOSIS — R410 Disorientation, unspecified: Secondary | ICD-10-CM | POA: Diagnosis not present

## 2015-10-06 DIAGNOSIS — N170 Acute kidney failure with tubular necrosis: Secondary | ICD-10-CM | POA: Diagnosis not present

## 2015-10-06 DIAGNOSIS — J449 Chronic obstructive pulmonary disease, unspecified: Secondary | ICD-10-CM | POA: Diagnosis not present

## 2015-10-06 DIAGNOSIS — I2510 Atherosclerotic heart disease of native coronary artery without angina pectoris: Secondary | ICD-10-CM | POA: Diagnosis not present

## 2015-10-06 DIAGNOSIS — F0280 Dementia in other diseases classified elsewhere without behavioral disturbance: Secondary | ICD-10-CM | POA: Diagnosis not present

## 2015-10-06 DIAGNOSIS — N390 Urinary tract infection, site not specified: Secondary | ICD-10-CM | POA: Diagnosis not present

## 2015-10-06 DIAGNOSIS — B9689 Other specified bacterial agents as the cause of diseases classified elsewhere: Secondary | ICD-10-CM | POA: Diagnosis not present

## 2015-10-06 LAB — URINE CULTURE

## 2015-10-06 MED ORDER — ORAL CARE MOUTH RINSE: 15.0000 mL | Freq: Two times a day (BID) | OROMUCOSAL | Status: DC

## 2015-10-06 MED ORDER — CIPROFLOXACIN HCL 500 MG PO TABS: 500.0000 mg | ORAL_TABLET | Freq: Two times a day (BID) | ORAL | 0 refills | Status: DC

## 2015-10-06 NOTE — Progress Notes (Signed)
PT Cancellation Note  Patient Details Name: Joy [name] MRN: [id number] DOB: 05-17-1930   Cancelled Treatment:    Reason Eval/Treat Not Completed: Fatigue/lethargy limiting ability to participate   Pt offered session this am.  Reported fatigue from bathing this morning and declined mobility or bedside exercises.     Chesley Noon 10/06/2015, 11:21 AM

## 2015-10-06 NOTE — Progress Notes (Signed)
Pharmacy Antibiotic Note  Joy [name] is a 82 y.o. female admitted on 10/03/2015 with UTI.  Pharmacy has been consulted for ceftriaxone dosing.  Plan: Pt on day 4 of ceftriaxone 1 g IV q 24h. UCx revealed K. Pneumoniae sensitive to ceftriaxone and cefazolin. Will attempt to de-escalate to cephalexin 500 mg q 12 to finish 7 days of therapy.  Height: 5\' 4"  (162.6 cm) Weight: 200 lb (90.7 kg) IBW/kg (Calculated) : 54.7  Temp (24hrs), Avg:98.1 F (36.7 C), Min:97.6 F (36.4 C), Max:98.6 F (37 C)   Recent Labs Lab 10/03/15 1927 10/05/15 0926  WBC 6.5  --   CREATININE 1.52* 1.25*    Estimated Creatinine Clearance: 37.9 mL/min (by C-G formula based on SCr of 1.25 mg/dL (H)).    Allergies  Allergen Reactions  . Codeine Other (See Comments)    Coma for 2 days  . Prednisone Other (See Comments)    Weight gain,puffy face  . Shellfish Allergy     Antimicrobials this admission: Ceftriaxone 9/29 >>   Microbiology results: 9/29 BCx: pending: no growth 3 days 9/29 UCx: Kleb  9/30 MRSA PCR: negative  Thank you for allowing pharmacy to be a part of this patient's care.  Darrow Bussing, PharmD Pharmacy Resident 10/06/2015 2:56 PM

## 2015-10-06 NOTE — Telephone Encounter (Signed)
[facility] [place] Call Center Patient Name: Joy [name] Gender: Female DOB: 01/03/1931 Age: 82 Y 9 M 20 D Return Phone Number: [phone] (Primary) Address: City/State/Zip: [place] Client [facility] - Client Client Site [facility] - Day Physician Arnette Norris - MD Contact Type Call Who Is Calling Patient / Member / Family / Caregiver Call Type Triage / Clinical Caller Name [name] Relationship To Patient Daughter Return Phone Number [phone] (Primary) Chief Complaint CONFUSION - new onset Reason for Call Symptomatic / Request for Health Information Initial Comment Mother has Alzheimer's and caller thinks she has a UTI. She's acting paranoid. Appointment Disposition EMR Appointment Not Necessary Info pasted into Epic No PreDisposition Call Doctor Translation No Nurse Assessment Nurse: Denyse Amass, RN, Benjamine Mola Date/Time (Eastern Time): 10/03/2015 4:33:36 PM Confirm and document reason for call. If symptomatic, describe symptoms. You must click the next button to save text entered. ---Daughter states the patient has Alzheimer's and she has been more confused today. The patient is expressing some discomfort with urination and not being able to empty her bladder. Patient is always incontinent of urine. No fever. Has the patient traveled out of the country within the last 30 days? ---Not Applicable Does the patient have any new or worsening symptoms? ---Yes Will a triage be completed? ---Yes Related visit to physician within the last 2 weeks? ---No Does the PT have any chronic conditions? (i.e. diabetes, asthma, etc.) ---Yes List chronic conditions. ---Alzheimer's HTN High Cholesterol Is this a behavioral health or substance abuse call? ---No Guidelines Guideline Title Affirmed Question Affirmed Notes Nurse Date/Time Eilene Ghazi Time) Confusion  - Delirium Bizarre or paranoid behavior [place], RN, Benjamine Mola 10/03/2015 4:37:52 PM Disp. Time Eilene Ghazi Time) Disposition Final User 10/03/2015 4:32:07 PM Send to Urgent Jeanmarie Hubert, Joaquim Lai

## 2015-10-06 NOTE — Discharge Summary (Addendum)
Joy [name], is a 82 y.o. female  DOB 1930-06-08  MRN [id number].  Admission date:  10/03/2015  Admitting Physician  Saundra Shelling, MD  Discharge Date:  10/06/2015   Primary MD  Arnette Norris, MD  Recommendations for primary care physician for things to follow:   follow up with primary doctor in 1 week   Admission Diagnosis  UTI (lower urinary tract infection) [N39.0] Altered mental status, unspecified altered mental status type [R41.82]   Discharge Diagnosis  UTI (lower urinary tract infection) [N39.0] Altered mental status, unspecified altered mental status type [R41.82]   Principal Problem:   UTI (lower urinary tract infection) Active Problems:   Altered mental state      Past Medical History:  Diagnosis Date  . Alzheimer disease   . Anxiety   . CAD (coronary artery disease)    h/o NSTEMI, LAD stent placement 10/04, cath 05/11/2010  . Carotid artery occlusion   . COPD (chronic obstructive pulmonary disease) ([facility])   . Depression   . Diastolic dysfunction    Echo 05/11/2010, EF 65-70%  . HLD (hyperlipidemia)   . HTN (hypertension)   . Hypertension   . Kidney infection   . PMR (polymyalgia rheumatica) ([facility])    with h/o steroid induced myopathy  . Thyroid disease   . TIA (transient ischemic attack)    11/05 s/p L CEA    Past Surgical History:  Procedure Laterality Date  . CARDIAC CATHETERIZATION     [facility]  . CARDIAC CATHETERIZATION    . CARDIAC CATHETERIZATION    . CAROTID ENDARTERECTOMY     left with stent placement 11/2003  . ILIAC ARTERY STENT     right, 2001  . LUNG BIOPSY         History of present illness and  Hospital Course:     Kindly see H&P for history of present illness and admission details, please review complete Labs, Consult reports and Test reports for all details in brief  HPI   from the history and physical done on the day of admission 82 year old female patient with history of Alzeimers dementia, CAD, COPD on oxygen at home, hyperlipidemia admitted because of altered mental status and found to have UTI.   Hospital Course     Altered mental status with metabolic encephalopathy secondary to UTI; patient urine culture showed  UTI sensitive to Cipro, other antibiotics except ampicillin. Discharge home with Cipro 500 mg by mouth twice a day for 7 days. Is alert, awake, oriented.  #2/acute renal failure due to ATN: Improved with hydration. Creatinine 1.52 on admission decreased to 1.25.  3.history of COPD on oxygen 2 L at home: No wheezing this time. Continue Spiriva, 4 hyperlipidemia #5. depression    Discharge Condition:stable   Follow UP; up with primary doctor in 1 week      Discharge Instructions  and  Discharge Medications        Medication List    STOP taking these medications   furosemide 20 MG tablet Commonly known as:  LASIX     TAKE these medications   ADVAIR DISKUS 250-50 MCG/DOSE Aepb Generic drug:  Fluticasone-Salmeterol INHALE 1 PUFF BY MOUTH TWICE A DAY   albuterol 108 (90 Base) MCG/ACT inhaler Commonly known as:  PROVENTIL HFA;VENTOLIN HFA Inhale 2 puffs into the lungs every 6 (six) hours as needed for wheezing or shortness of breath.   albuterol (2.5 MG/3ML) 0.083% nebulizer solution Commonly known as:  PROVENTIL Take 3 mLs (2.5 mg total) by  nebulization every 6 (six) hours as needed for wheezing or shortness of breath.   amLODipine 5 MG tablet Commonly known as:  NORVASC Take 1 tablet (5 mg total) by mouth daily.   atorvastatin 40 MG tablet Commonly known as:  LIPITOR TAKE 1 TABLET BY MOUTH EVERY DAY   ciprofloxacin 500 MG tablet Commonly known as:  CIPRO Take 1 tablet (500 mg total) by mouth 2 (two) times daily.   clopidogrel 75 MG tablet Commonly known as:  PLAVIX Take 1 tablet (75 mg total) by mouth  daily.   gabapentin 800 MG tablet Commonly known as:  NEURONTIN TAKE 1 TABLET BY MOUTH TWICE A DAY   levothyroxine 25 MCG tablet Commonly known as:  SYNTHROID, LEVOTHROID Take 25 mcg by mouth daily before breakfast.   lisinopril 10 MG tablet Commonly known as:  PRINIVIL,ZESTRIL Take 1 tablet (10 mg total) by mouth daily.   nitroGLYCERIN 0.4 MG SL tablet Commonly known as:  NITROSTAT Place 0.4 mg under the tongue every 5 (five) minutes as needed.   ranitidine 300 MG tablet Commonly known as:  ZANTAC TAKE 1 TABLET (300 MG TOTAL) BY MOUTH AT BEDTIME.   sertraline 100 MG tablet Commonly known as:  ZOLOFT TAKE 1 TABLET BY MOUTH EVERY DAY   SPIRIVA HANDIHALER 18 MCG inhalation capsule Generic drug:  tiotropium PLACE 1 CAPSULE (18 MCG TOTAL) INTO INHALER AND INHALE DAILY.   traZODone 50 MG tablet Commonly known as:  DESYREL TAKE 1/2 TO 1 TABLET AT BEDTIME AS NEEDED FOR SLEEP   vitamin B-12 1000 MCG tablet Commonly known as:  CYANOCOBALAMIN Take 1,000 mcg by mouth daily.   Vitamin D (Ergocalciferol) 50000 units Caps capsule Commonly known as:  DRISDOL Take 50,000 Units by mouth every 7 (seven) days.       Patient will follow up with outpatient physical therapy at [facility]  Diet and Activity recommendation: See Discharge Instructions above   Consults obtained -Physical therapy   Major procedures and Radiology Reports - PLEASE review detailed and final reports for all details, in brief -     No results found.  Micro Results     Recent Results (from the past 240 hour(s))  Urine culture     Status: Abnormal   Collection Time: 10/03/15  7:27 PM  Result Value Ref Range Status   Specimen Description URINE, RANDOM  Final   Special Requests NONE  Final   Culture >=100,000 COLONIES/mL KLEBSIELLA PNEUMONIAE (A)  Final   Report Status 10/06/2015 FINAL  Final   Organism ID, Bacteria KLEBSIELLA PNEUMONIAE (A)  Final      Susceptibility   Klebsiella pneumoniae -  MIC*    AMPICILLIN >=32 RESISTANT Resistant     CEFAZOLIN <=4 SENSITIVE Sensitive     CEFTRIAXONE <=1 SENSITIVE Sensitive     CIPROFLOXACIN <=0.25 SENSITIVE Sensitive     GENTAMICIN <=1 SENSITIVE Sensitive     IMIPENEM <=0.25 SENSITIVE Sensitive     NITROFURANTOIN 32 SENSITIVE Sensitive     TRIMETH/SULFA <=20 SENSITIVE Sensitive     AMPICILLIN/SULBACTAM 4 SENSITIVE Sensitive     PIP/TAZO <=4 SENSITIVE Sensitive     Extended ESBL NEGATIVE Sensitive     * >=100,000 COLONIES/mL KLEBSIELLA PNEUMONIAE  Blood culture (routine x 2)     Status: None (Preliminary result)   Collection Time: 10/03/15 10:25 PM  Result Value Ref Range Status   Specimen Description BLOOD RIGHT ANTECUBITAL  Final   Special Requests BOTTLES DRAWN AEROBIC AND ANAEROBIC 10CC  Final  Culture NO GROWTH 3 DAYS  Final   Report Status PENDING  Incomplete  Blood culture (routine x 2)     Status: None (Preliminary result)   Collection Time: 10/03/15 10:35 PM  Result Value Ref Range Status   Specimen Description BLOOD RIGHT ANTECUBITAL  Final   Special Requests BOTTLES DRAWN AEROBIC AND ANAEROBIC 10CC  Final   Culture NO GROWTH 3 DAYS  Final   Report Status PENDING  Incomplete  MRSA PCR Screening     Status: None   Collection Time: 10/04/15  4:54 AM  Result Value Ref Range Status   MRSA by PCR NEGATIVE NEGATIVE Final    Comment:        The GeneXpert MRSA Assay (FDA approved for NASAL specimens only), is one component of a comprehensive MRSA colonization surveillance program. It is not intended to diagnose MRSA infection nor to guide or monitor treatment for MRSA infections.        Today   Subjective:   Joy [name] today has no headache,no chest abdominal pain,no new weakness tingling or numbness, feels much better wants to go home today.   Objective:   Blood pressure 128/79, pulse 65, temperature 98.6 F (37 C), temperature source Oral, resp. rate 17, height 5\' 4"  (1.626 m), weight 90.7 kg (200  lb), SpO2 95 %.   Intake/Output Summary (Last 24 hours) at 10/06/15 1445 Last data filed at 10/06/15 1321  Gross per 24 hour  Intake              410 ml  Output                0 ml  Net              410 ml    Exam Awake Alert, Oriented x 3, No new F.N deficits, Normal affect [place].AT,PERRAL Supple Neck,No JVD, No cervical lymphadenopathy appriciated.  Symmetrical Chest wall movement, Good air movement bilaterally, CTAB RRR,No Gallops,Rubs or new Murmurs, No Parasternal Heave +ve B.Sounds, Abd Soft, Non tender, No organomegaly appriciated, No rebound -guarding or rigidity. No Cyanosis, Clubbing or edema, No new Rash or bruise  Data Review   CBC w Diff:  Lab Results  Component Value Date   WBC 6.5 10/03/2015   HGB 11.8 (L) 10/03/2015   HGB 11.8 (L) 04/17/2014   HCT 35.3 10/03/2015   HCT 36.8 04/17/2014   PLT 164 10/03/2015   PLT 172 04/17/2014   LYMPHOPCT 13 08/09/2015   LYMPHOPCT 5.3 04/07/2013   MONOPCT 10 08/09/2015   MONOPCT 11.2 04/07/2013   EOSPCT 2 08/09/2015   EOSPCT 1.3 04/07/2013   BASOPCT 1 08/09/2015   BASOPCT 0.5 04/07/2013    CMP:  Lab Results  Component Value Date   NA 142 10/05/2015   NA 148 (H) 09/02/2015   NA 141 04/17/2014   K 4.1 10/05/2015   K 3.7 04/17/2014   CL 110 10/05/2015   CL 104 04/17/2014   CO2 27 10/05/2015   CO2 32 04/17/2014   BUN 21 (H) 10/05/2015   BUN 35 (H) 09/02/2015   BUN 23 (H) 04/17/2014   CREATININE 1.25 (H) 10/05/2015   CREATININE 1.01 (H) 04/17/2014   PROT 7.2 10/03/2015   PROT 6.2 (L) 04/06/2013   ALBUMIN 4.3 10/03/2015   ALBUMIN 2.5 (L) 04/06/2013   BILITOT 0.4 10/03/2015   BILITOT 0.6 04/06/2013   ALKPHOS 139 (H) 10/03/2015   ALKPHOS 87 04/06/2013   AST 17 10/03/2015   AST 23 04/06/2013  ALT 9 (L) 10/03/2015   ALT 12 04/06/2013  .   Total Time in preparing paper work, data evaluation and todays exam - 66 minutes  Joy [name] M.D on 10/06/2015 at 2:45 PM    Note: This dictation was prepared  with Dragon dictation along with smaller phrase technology. Any transcriptional errors that result from this process are unintentional.

## 2015-10-06 NOTE — Progress Notes (Signed)
Patient A&O though forgetful at times, VSS.  No complaints of pain.  Discharge instructions reviewed in detail with patient, husband and daughter including medication changes and followup appointments.  Understanding was verbalized and all questions were answered.  Patient discharged home via wheelchair in stable condition escorted by nursing staff.

## 2015-10-06 NOTE — Care Management (Signed)
Patient admitted from home with Altered mental status with metabolic encephalopathy secondary to UTI. Patient lives at [facility] independent with her husband. Patient has a walker, rollator WC, BSC, shower chair in the home.  Patient has 4 hours of aide services 7 days a week.  PCP Deborra Medina, pharmacy [facility]. PT has assessed patient and recommend home health PT.  Daughter and husband at bedside when notified of recommendation.  Family would like patient to participate in outpatient PT at facility. MD notified and to update discharge summary.  No further needs identified.

## 2015-10-06 NOTE — Telephone Encounter (Signed)
Chart reviewed.  Admitted to the hospital over the weekend.

## 2015-10-07 ENCOUNTER — Telehealth: Payer: Self-pay

## 2015-10-07 NOTE — Telephone Encounter (Signed)
Transition Care Management Follow-up Telephone Call    Date discharged? 10/06/2015  How have you been since you were released from the hospital? Recovering. Pt is still using Oxygen 2L [place] continuous.   Any patient concerns? Pt was not aware that PT should resume at [facility]. Pt feels that insurance may not pay for any additional therapy sessions.    Items Reviewed:  Medications reviewed: No, daughter takes care of medications  Allergies reviewed: No, daughter takes care of medications  Dietary changes reviewed: Yes  Referrals reviewed: Yes   Functional Questionnaire:  Independent - I Dependent - D    Activities of Daily Living (ADLs):    Personal hygiene - I Dressing - I Eating - I Maintaining continence - D (has urinary incontinence) Transferring - I (may use cane or walker to transfer or ambulate)  Independent Activities of Daily Living (iADLs): Basic communication skills - I Transportation - D (does not drive) Meal preparation  - D (spouse takes care of meals) Shopping - D (spouse does shopping) Housework - D (external housekeepers) Managing medications - D (daughter takes care of medications)  Managing personal finances - D (daughter takes care of finances)   Confirmed importance and date/time of follow-up visits scheduled YES  Provider Appointment booked with PCP 10/13/15 @ 1130  Confirmed with patient if condition begins to worsen call PCP or go to the ER.  Patient was given the office number and encouraged to call back with question or concerns: YES

## 2015-10-08 LAB — CULTURE, BLOOD (ROUTINE X 2)
CULTURE: NO GROWTH
Culture: NO GROWTH

## 2015-10-13 ENCOUNTER — Encounter: Payer: Self-pay | Admitting: Family Medicine

## 2015-10-13 ENCOUNTER — Ambulatory Visit (INDEPENDENT_AMBULATORY_CARE_PROVIDER_SITE_OTHER): Payer: Medicare Other | Admitting: Family Medicine

## 2015-10-13 VITALS — BP 102/62 | HR 83 | Temp 98.5°F | Wt 206.8 lb

## 2015-10-13 DIAGNOSIS — Z23 Encounter for immunization: Secondary | ICD-10-CM

## 2015-10-13 DIAGNOSIS — R4182 Altered mental status, unspecified: Secondary | ICD-10-CM

## 2015-10-13 DIAGNOSIS — N189 Chronic kidney disease, unspecified: Secondary | ICD-10-CM

## 2015-10-13 DIAGNOSIS — N179 Acute kidney failure, unspecified: Secondary | ICD-10-CM

## 2015-10-13 DIAGNOSIS — N183 Chronic kidney disease, stage 3 (moderate): Secondary | ICD-10-CM | POA: Diagnosis not present

## 2015-10-13 DIAGNOSIS — N390 Urinary tract infection, site not specified: Secondary | ICD-10-CM

## 2015-10-13 LAB — BASIC METABOLIC PANEL
BUN: 32 mg/dL — ABNORMAL HIGH (ref 6–23)
CHLORIDE: 111 meq/L (ref 96–112)
CO2: 27 mEq/L (ref 19–32)
CREATININE: 1.78 mg/dL — AB (ref 0.40–1.20)
Calcium: 9.1 mg/dL (ref 8.4–10.5)
GFR: 28.94 mL/min — ABNORMAL LOW (ref >60.00)
Glucose, Bld: 92 mg/dL (ref 70–99)
Potassium: 4 mEq/L (ref 3.5–5.1)
SODIUM: 145 meq/L (ref 135–145)

## 2015-10-13 NOTE — Progress Notes (Signed)
Pre visit review using our clinic review tool, if applicable. No additional management support is needed unless otherwise documented below in the visit note. 

## 2015-10-13 NOTE — Assessment & Plan Note (Signed)
Prerenal. BMET today.

## 2015-10-13 NOTE — Patient Instructions (Signed)
Happy birthday. We will call you with your lab results.

## 2015-10-13 NOTE — Assessment & Plan Note (Signed)
S/p cipro. Push fluids.

## 2015-10-13 NOTE — Assessment & Plan Note (Signed)
Resolved.  Likely secondary to UTI.

## 2015-10-13 NOTE — Addendum Note (Signed)
Addended by: Ellamae Sia on: 10/13/2015 11:29 AM   Modules accepted: Orders

## 2015-10-13 NOTE — Progress Notes (Signed)
Subjective:   Patient ID: Joy [name], female    DOB: August 28, 1930, 82 y.o.   MRN: [id number]  Joy [name] is a pleasant 82 y.o. year old female who presents to clinic today with Hospitalization Follow-up and Fall (10/8)  on 10/13/2015  HPI:  Notes reviewed. Admitted 9/29 - 10/06/15  Presented with AMS with metabolic encephalopathy secondary to UTI. Urine cx >100,000 Klebsiella, sensitive to cipro which she was given. Discharged home with 7 day course.  Acute renal failure due to ATN.  Was given IVFs and Cr improved at d/c.  She has fallen twice in recent weeks.  Being admitted to ALF next week but family has not told her yet. Lab Results  Component Value Date   CREATININE 1.25 (H) 10/05/2015   Current Outpatient Prescriptions on File Prior to Visit  Medication Sig Dispense Refill  . ADVAIR DISKUS 250-50 MCG/DOSE AEPB INHALE 1 PUFF BY MOUTH TWICE A DAY 60 each 0  . albuterol (PROVENTIL HFA;VENTOLIN HFA) 108 (90 Base) MCG/ACT inhaler Inhale 2 puffs into the lungs every 6 (six) hours as needed for wheezing or shortness of breath. 1 Inhaler 0  . albuterol (PROVENTIL) (2.5 MG/3ML) 0.083% nebulizer solution Take 3 mLs (2.5 mg total) by nebulization every 6 (six) hours as needed for wheezing or shortness of breath. 150 mL 1  . amLODipine (NORVASC) 5 MG tablet Take 1 tablet (5 mg total) by mouth daily. 30 tablet 5  . atorvastatin (LIPITOR) 40 MG tablet TAKE 1 TABLET BY MOUTH EVERY DAY 30 tablet 1  . clopidogrel (PLAVIX) 75 MG tablet Take 1 tablet (75 mg total) by mouth daily. 90 tablet 1  . gabapentin (NEURONTIN) 800 MG tablet TAKE 1 TABLET BY MOUTH TWICE A DAY 60 tablet 5  . levothyroxine (SYNTHROID, LEVOTHROID) 25 MCG tablet Take 25 mcg by mouth daily before breakfast.    . lisinopril (PRINIVIL,ZESTRIL) 10 MG tablet Take 1 tablet (10 mg total) by mouth daily. 30 tablet 5  . nitroGLYCERIN (NITROSTAT) 0.4 MG SL tablet Place 0.4 mg under the tongue every 5 (five) minutes as needed.       . ranitidine (ZANTAC) 300 MG tablet TAKE 1 TABLET (300 MG TOTAL) BY MOUTH AT BEDTIME. 90 tablet 2  . sertraline (ZOLOFT) 100 MG tablet TAKE 1 TABLET BY MOUTH EVERY DAY 30 tablet 1  . SPIRIVA HANDIHALER 18 MCG inhalation capsule PLACE 1 CAPSULE (18 MCG TOTAL) INTO INHALER AND INHALE DAILY. 30 capsule 10  . traZODone (DESYREL) 50 MG tablet TAKE 1/2 TO 1 TABLET AT BEDTIME AS NEEDED FOR SLEEP 30 tablet 5  . vitamin B-12 (CYANOCOBALAMIN) 1000 MCG tablet Take 1,000 mcg by mouth daily.    . Vitamin D, Ergocalciferol, (DRISDOL) 50000 units CAPS capsule Take 50,000 Units by mouth every 7 (seven) days.     No current facility-administered medications on file prior to visit.     Allergies  Allergen Reactions  . Codeine Other (See Comments)    Coma for 2 days  . Prednisone Other (See Comments)    Weight gain,puffy face  . Shellfish Allergy     Past Medical History:  Diagnosis Date  . Alzheimer disease   . Anxiety   . CAD (coronary artery disease)    h/o NSTEMI, LAD stent placement 10/04, cath 05/11/2010  . Carotid artery occlusion   . COPD (chronic obstructive pulmonary disease) ([place])   . Depression   . Diastolic dysfunction    Echo 05/11/2010, EF 65-70%  . HLD (hyperlipidemia)   .  HTN (hypertension)   . Hypertension   . Kidney infection   . PMR (polymyalgia rheumatica) (HCC)    with h/o steroid induced myopathy  . Thyroid disease   . TIA (transient ischemic attack)    11/05 s/p L CEA    Past Surgical History:  Procedure Laterality Date  . CARDIAC CATHETERIZATION     unc  . CARDIAC CATHETERIZATION    . CARDIAC CATHETERIZATION    . CAROTID ENDARTERECTOMY     left with stent placement 11/2003  . ILIAC ARTERY STENT     right, 2001  . LUNG BIOPSY      No family history on file.  Social History   Social History  . Marital status: Married    Spouse name: N/A  . Number of children: N/A  . Years of education: N/A   Occupational History  . retired     Pharmacist, hospital   Social  History Main Topics  . Smoking status: Former Smoker    Packs/day: 1.00    Years: 25.00    Types: Cigarettes    Quit date: 01/05/1996  . Smokeless tobacco: Never Used  . Alcohol use No  . Drug use: No  . Sexual activity: Not on file   Other Topics Concern  . Not on file   Social History Narrative   Lives with husband, [name] at [place].   Daughters, [name] and [name] very involved.   The PMH, PSH, Social History, Family History, Medications, and allergies have been reviewed in [facility], and have been updated if relevant.  Review of Systems  Constitutional: Negative.   HENT: Negative.   Eyes: Negative.   Respiratory: Negative.   Cardiovascular: Negative.   Gastrointestinal: Negative.   Endocrine: Negative.   Genitourinary: Negative.   Musculoskeletal: Negative.   Allergic/Immunologic: Negative.   Neurological: Negative.   Hematological: Negative.   Psychiatric/Behavioral: Negative.   All other systems reviewed and are negative.      Objective:    BP 102/62   Pulse 83   Temp 98.5 F (36.9 C) (Oral)   Wt 206 lb 12 oz (93.8 kg)   SpO2 92%   BMI 35.49 kg/m   Wt Readings from Last 3 Encounters:  10/13/15 206 lb 12 oz (93.8 kg)  10/03/15 200 lb (90.7 kg)  08/09/15 202 lb (91.6 kg)    Physical Exam  Constitutional: She is oriented to person, place, and time. She appears well-developed and well-nourished. No distress.  HENT:  Head: Normocephalic.  Eyes: Conjunctivae are normal.  Cardiovascular: Regular rhythm.   Pulmonary/Chest: Effort normal and breath sounds normal.  Musculoskeletal: Normal range of motion. She exhibits no edema.  Neurological: She is alert and oriented to person, place, and time. No cranial nerve deficit.  Skin: Skin is warm and dry.  Psychiatric: She has a normal mood and affect. Her behavior is normal. Judgment and thought content normal.  Nursing note and vitals reviewed.         Assessment & Plan:   Altered mental status, unspecified  altered mental status type  Lower urinary tract infectious disease - Plan: Basic metabolic panel  Acute renal failure superimposed on stage 3 chronic kidney disease, unspecified acute renal failure type ([place]) No Follow-up on file.

## 2015-10-15 ENCOUNTER — Telehealth: Payer: Self-pay

## 2015-10-15 ENCOUNTER — Ambulatory Visit: Payer: Self-pay

## 2015-10-15 NOTE — Telephone Encounter (Signed)
Info printed and faxed as requested

## 2015-10-15 NOTE — Telephone Encounter (Signed)
Pt is being admitted to Memory Care at [facility]. They are asking that we fax her most recent OV Note and Immunization record to [phone]

## 2015-10-15 NOTE — Addendum Note (Signed)
Addended by: Modena Nunnery on: 10/15/2015 11:45 AM   Modules accepted: Orders

## 2015-10-21 DIAGNOSIS — R4189 Other symptoms and signs involving cognitive functions and awareness: Secondary | ICD-10-CM | POA: Diagnosis not present

## 2015-10-21 DIAGNOSIS — M6281 Muscle weakness (generalized): Secondary | ICD-10-CM | POA: Diagnosis not present

## 2015-10-22 DIAGNOSIS — M6281 Muscle weakness (generalized): Secondary | ICD-10-CM | POA: Diagnosis not present

## 2015-10-22 DIAGNOSIS — R4189 Other symptoms and signs involving cognitive functions and awareness: Secondary | ICD-10-CM | POA: Diagnosis not present

## 2015-10-23 DIAGNOSIS — M6281 Muscle weakness (generalized): Secondary | ICD-10-CM | POA: Diagnosis not present

## 2015-10-23 DIAGNOSIS — R4189 Other symptoms and signs involving cognitive functions and awareness: Secondary | ICD-10-CM | POA: Diagnosis not present

## 2015-10-24 DIAGNOSIS — R4189 Other symptoms and signs involving cognitive functions and awareness: Secondary | ICD-10-CM | POA: Diagnosis not present

## 2015-10-24 DIAGNOSIS — M6281 Muscle weakness (generalized): Secondary | ICD-10-CM | POA: Diagnosis not present

## 2015-10-27 DIAGNOSIS — R4189 Other symptoms and signs involving cognitive functions and awareness: Secondary | ICD-10-CM | POA: Diagnosis not present

## 2015-10-27 DIAGNOSIS — M6281 Muscle weakness (generalized): Secondary | ICD-10-CM | POA: Diagnosis not present

## 2015-10-28 DIAGNOSIS — R4189 Other symptoms and signs involving cognitive functions and awareness: Secondary | ICD-10-CM | POA: Diagnosis not present

## 2015-10-28 DIAGNOSIS — M6281 Muscle weakness (generalized): Secondary | ICD-10-CM | POA: Diagnosis not present

## 2015-10-29 DIAGNOSIS — M6281 Muscle weakness (generalized): Secondary | ICD-10-CM | POA: Diagnosis not present

## 2015-10-29 DIAGNOSIS — R4189 Other symptoms and signs involving cognitive functions and awareness: Secondary | ICD-10-CM | POA: Diagnosis not present

## 2015-10-30 DIAGNOSIS — M6281 Muscle weakness (generalized): Secondary | ICD-10-CM | POA: Diagnosis not present

## 2015-10-30 DIAGNOSIS — R4189 Other symptoms and signs involving cognitive functions and awareness: Secondary | ICD-10-CM | POA: Diagnosis not present

## 2015-10-31 DIAGNOSIS — M6281 Muscle weakness (generalized): Secondary | ICD-10-CM | POA: Diagnosis not present

## 2015-10-31 DIAGNOSIS — R4189 Other symptoms and signs involving cognitive functions and awareness: Secondary | ICD-10-CM | POA: Diagnosis not present

## 2015-11-04 DIAGNOSIS — R4189 Other symptoms and signs involving cognitive functions and awareness: Secondary | ICD-10-CM | POA: Diagnosis not present

## 2015-11-04 DIAGNOSIS — M6281 Muscle weakness (generalized): Secondary | ICD-10-CM | POA: Diagnosis not present

## 2015-11-05 DIAGNOSIS — M6281 Muscle weakness (generalized): Secondary | ICD-10-CM | POA: Diagnosis not present

## 2015-11-05 DIAGNOSIS — R4189 Other symptoms and signs involving cognitive functions and awareness: Secondary | ICD-10-CM | POA: Diagnosis not present

## 2015-11-07 DIAGNOSIS — R4189 Other symptoms and signs involving cognitive functions and awareness: Secondary | ICD-10-CM | POA: Diagnosis not present

## 2015-11-07 DIAGNOSIS — M6281 Muscle weakness (generalized): Secondary | ICD-10-CM | POA: Diagnosis not present

## 2015-11-18 DIAGNOSIS — R4182 Altered mental status, unspecified: Secondary | ICD-10-CM | POA: Diagnosis not present

## 2015-11-20 ENCOUNTER — Encounter: Payer: Self-pay | Admitting: Family Medicine

## 2015-12-10 DIAGNOSIS — I5020 Unspecified systolic (congestive) heart failure: Secondary | ICD-10-CM

## 2015-12-10 DIAGNOSIS — J9611 Chronic respiratory failure with hypoxia: Secondary | ICD-10-CM

## 2015-12-10 DIAGNOSIS — J449 Chronic obstructive pulmonary disease, unspecified: Secondary | ICD-10-CM

## 2015-12-10 DIAGNOSIS — R05 Cough: Secondary | ICD-10-CM

## 2015-12-10 DIAGNOSIS — R0602 Shortness of breath: Secondary | ICD-10-CM

## 2015-12-11 ENCOUNTER — Telehealth: Payer: Self-pay

## 2015-12-11 NOTE — Telephone Encounter (Signed)
Spoke to [place] at [facility] and she is aware of the order

## 2015-12-11 NOTE — Telephone Encounter (Signed)
Joy [name] with memory Care at [facility] left v/m; 12/09/15 started with congestion and respiratory symptoms; on 12/10/15 [name] NP started nebulizer and mucinex and pt at that point had not had fever. Pt daughter said pt gets pneumonia easily; last night temp 100.5 and this morning temp is 100. Joy [name] request cb.

## 2015-12-11 NOTE — Telephone Encounter (Signed)
I just spoke with her daughter, [name] and am aware of the situation.  I unfortunately to not make rounds at [facility] anymore.  Will route to [place].

## 2015-12-11 NOTE — Telephone Encounter (Signed)
Call susan at [employer]  at [facility], order portable chest xray. [phone]

## 2015-12-12 ENCOUNTER — Telehealth: Payer: Self-pay | Admitting: Internal Medicine

## 2015-12-12 NOTE — Telephone Encounter (Signed)
Pt was started on Augmentin

## 2015-12-12 NOTE — Telephone Encounter (Signed)
PLEASE NOTE: All timestamps contained within this report are represented as [place] Standard Time. CONFIDENTIALTY NOTICE: This fax transmission is intended only for the addressee. It contains information that is legally privileged, confidential or otherwise protected from use or disclosure. If you are not the intended recipient, you are strictly prohibited from reviewing, disclosing, copying using or disseminating any of this information or taking any action in reliance on or regarding this information. If you have received this fax in error, please notify us immediately by telephone so that we can arrange for its return to [place]. Phone: [phone], Toll-Free: [phone], Fax: [phone] Page: 1 of 1 Call Id: [id number] [place] Night - Client Nonclinical Telephone Record [place] Night - Client Client Site [place] Physician [name] - NP Contact Type Call Call [place] Page Now Who Is [place] / [place] Name [place] Name Facility Number [phone] Patient Name Joy [name] Patient DOB 10/07/1930 Reason for Call Request to speak to Physician Initial Comment Allen Derry with [facility] and she has results for the doctor. CBN: [phone] Additional Comment Paging DoctorName Phone DateTime Result/Outcome Message Type Notes Pricilla Holm- MD [phone] 12/11/2015 9:46:29 PM Paged On Call Back to Call Center Doctor Paged Pricilla Holm- MD 12/11/2015 9:46:34 PM Spoke with On Call - General Message Result Call Closed By: Oswaldo Milian Transaction Date/Time: 12/11/2015 9:31:46 PM (ET)

## 2015-12-12 NOTE — Telephone Encounter (Signed)
Joy [name] is there this morning and can follow up on this

## 2015-12-12 NOTE — Telephone Encounter (Signed)
Called with results of CXR for patient with finding of mild pneumonia. Do not see visit with provider to indicate clinical condition and treatment pathway so will forward to ordering physician for consideration of out-patient versus inpatient treatment.

## 2016-01-14 DIAGNOSIS — M7550 Bursitis of unspecified shoulder: Secondary | ICD-10-CM | POA: Diagnosis not present

## 2016-02-17 ENCOUNTER — Telehealth: Payer: Self-pay

## 2016-02-17 MED ORDER — OSELTAMIVIR PHOSPHATE 75 MG PO CAPS: 75.0000 mg | ORAL_CAPSULE | Freq: Every day | ORAL | 0 refills | Status: DC

## 2016-02-17 NOTE — Telephone Encounter (Signed)
[name] DPR signed left v/m that pt was seen at [facility] by Raoul Pitch NP. pts mom temp today was 101+, nausea and pt is on oxygen. [name] is concerned that pt might have the flu and wonders if pt should be started on tamiflu. [name] request note to go to Dr Deborra Medina and request cb.

## 2016-02-17 NOTE — Telephone Encounter (Signed)
tamiflu erx sent.

## 2016-02-18 DIAGNOSIS — R0602 Shortness of breath: Secondary | ICD-10-CM | POA: Diagnosis not present

## 2016-02-18 DIAGNOSIS — R05 Cough: Secondary | ICD-10-CM | POA: Diagnosis not present

## 2016-02-26 DIAGNOSIS — F0390 Unspecified dementia without behavioral disturbance: Secondary | ICD-10-CM | POA: Diagnosis not present

## 2016-03-11 DIAGNOSIS — J9611 Chronic respiratory failure with hypoxia: Secondary | ICD-10-CM | POA: Diagnosis not present

## 2016-03-11 DIAGNOSIS — I5022 Chronic systolic (congestive) heart failure: Secondary | ICD-10-CM | POA: Diagnosis not present

## 2016-03-11 DIAGNOSIS — J449 Chronic obstructive pulmonary disease, unspecified: Secondary | ICD-10-CM | POA: Diagnosis not present

## 2016-03-16 ENCOUNTER — Ambulatory Visit: Payer: Self-pay | Admitting: Cardiovascular Disease

## 2016-03-30 ENCOUNTER — Telehealth: Payer: Self-pay | Admitting: Family Medicine

## 2016-03-30 NOTE — Telephone Encounter (Signed)
Pt mailed in  social security paper work that needs filed out and sent back into [employer].  There is an SASE enclosed.  Placing in rx tower.  Thanks

## 2016-03-31 DIAGNOSIS — Z7689 Persons encountering health services in other specified circumstances: Secondary | ICD-10-CM

## 2016-03-31 DIAGNOSIS — R4182 Altered mental status, unspecified: Secondary | ICD-10-CM | POA: Diagnosis not present

## 2016-03-31 DIAGNOSIS — R300 Dysuria: Secondary | ICD-10-CM | POA: Diagnosis not present

## 2016-03-31 NOTE — Telephone Encounter (Signed)
Notified pt paper work ([employer]) is ready to be pick up...the patient requested to be mailed

## 2016-04-04 ENCOUNTER — Encounter: Payer: Self-pay | Admitting: *Deleted

## 2016-04-04 ENCOUNTER — Emergency Department: Payer: Medicare Other

## 2016-04-04 ENCOUNTER — Inpatient Hospital Stay
Admission: EM | Admit: 2016-04-04 | Discharge: 2016-04-07 | DRG: 682 | Disposition: A | Discharge status: Nursing Home (Includes ICF) | Payer: Medicare Other | Attending: Internal Medicine | Admitting: Internal Medicine

## 2016-04-04 DIAGNOSIS — I2510 Atherosclerotic heart disease of native coronary artery without angina pectoris: Secondary | ICD-10-CM | POA: Diagnosis present

## 2016-04-04 DIAGNOSIS — F0390 Unspecified dementia without behavioral disturbance: Secondary | ICD-10-CM | POA: Diagnosis not present

## 2016-04-04 DIAGNOSIS — I130 Hypertensive heart and chronic kidney disease with heart failure and stage 1 through stage 4 chronic kidney disease, or unspecified chronic kidney disease: Secondary | ICD-10-CM | POA: Diagnosis present

## 2016-04-04 DIAGNOSIS — E039 Hypothyroidism, unspecified: Secondary | ICD-10-CM | POA: Diagnosis present

## 2016-04-04 DIAGNOSIS — Z7401 Bed confinement status: Secondary | ICD-10-CM | POA: Diagnosis not present

## 2016-04-04 DIAGNOSIS — F0280 Dementia in other diseases classified elsewhere without behavioral disturbance: Secondary | ICD-10-CM | POA: Diagnosis not present

## 2016-04-04 DIAGNOSIS — W19XXXA Unspecified fall, initial encounter: Secondary | ICD-10-CM | POA: Diagnosis present

## 2016-04-04 DIAGNOSIS — J9601 Acute respiratory failure with hypoxia: Secondary | ICD-10-CM | POA: Diagnosis not present

## 2016-04-04 DIAGNOSIS — Z66 Do not resuscitate: Secondary | ICD-10-CM | POA: Diagnosis not present

## 2016-04-04 DIAGNOSIS — T380X5S Adverse effect of glucocorticoids and synthetic analogues, sequela: Secondary | ICD-10-CM

## 2016-04-04 DIAGNOSIS — N179 Acute kidney failure, unspecified: Secondary | ICD-10-CM | POA: Diagnosis not present

## 2016-04-04 DIAGNOSIS — W06XXXA Fall from bed, initial encounter: Secondary | ICD-10-CM | POA: Diagnosis not present

## 2016-04-04 DIAGNOSIS — M353 Polymyalgia rheumatica: Secondary | ICD-10-CM | POA: Diagnosis present

## 2016-04-04 DIAGNOSIS — Z79899 Other long term (current) drug therapy: Secondary | ICD-10-CM

## 2016-04-04 DIAGNOSIS — Z7951 Long term (current) use of inhaled steroids: Secondary | ICD-10-CM

## 2016-04-04 DIAGNOSIS — R0902 Hypoxemia: Secondary | ICD-10-CM

## 2016-04-04 DIAGNOSIS — Z7189 Other specified counseling: Secondary | ICD-10-CM | POA: Diagnosis not present

## 2016-04-04 DIAGNOSIS — M6281 Muscle weakness (generalized): Secondary | ICD-10-CM | POA: Diagnosis not present

## 2016-04-04 DIAGNOSIS — I700 Atherosclerosis of aorta: Secondary | ICD-10-CM | POA: Diagnosis present

## 2016-04-04 DIAGNOSIS — Z9981 Dependence on supplemental oxygen: Secondary | ICD-10-CM | POA: Diagnosis not present

## 2016-04-04 DIAGNOSIS — R627 Adult failure to thrive: Secondary | ICD-10-CM | POA: Diagnosis present

## 2016-04-04 DIAGNOSIS — I959 Hypotension, unspecified: Secondary | ICD-10-CM | POA: Diagnosis present

## 2016-04-04 DIAGNOSIS — Z87891 Personal history of nicotine dependence: Secondary | ICD-10-CM

## 2016-04-04 DIAGNOSIS — Z91013 Allergy to seafood: Secondary | ICD-10-CM

## 2016-04-04 DIAGNOSIS — J9621 Acute and chronic respiratory failure with hypoxia: Secondary | ICD-10-CM | POA: Diagnosis present

## 2016-04-04 DIAGNOSIS — G9341 Metabolic encephalopathy: Secondary | ICD-10-CM | POA: Diagnosis not present

## 2016-04-04 DIAGNOSIS — E785 Hyperlipidemia, unspecified: Secondary | ICD-10-CM | POA: Diagnosis present

## 2016-04-04 DIAGNOSIS — F419 Anxiety disorder, unspecified: Secondary | ICD-10-CM | POA: Diagnosis present

## 2016-04-04 DIAGNOSIS — N183 Chronic kidney disease, stage 3 (moderate): Secondary | ICD-10-CM | POA: Diagnosis present

## 2016-04-04 DIAGNOSIS — I5032 Chronic diastolic (congestive) heart failure: Secondary | ICD-10-CM | POA: Diagnosis present

## 2016-04-04 DIAGNOSIS — T426X5A Adverse effect of other antiepileptic and sedative-hypnotic drugs, initial encounter: Secondary | ICD-10-CM | POA: Diagnosis present

## 2016-04-04 DIAGNOSIS — J439 Emphysema, unspecified: Secondary | ICD-10-CM | POA: Diagnosis present

## 2016-04-04 DIAGNOSIS — F329 Major depressive disorder, single episode, unspecified: Secondary | ICD-10-CM | POA: Diagnosis present

## 2016-04-04 DIAGNOSIS — G309 Alzheimer's disease, unspecified: Secondary | ICD-10-CM | POA: Diagnosis not present

## 2016-04-04 DIAGNOSIS — G92 Toxic encephalopathy: Secondary | ICD-10-CM | POA: Diagnosis present

## 2016-04-04 DIAGNOSIS — S0990XA Unspecified injury of head, initial encounter: Secondary | ICD-10-CM | POA: Diagnosis not present

## 2016-04-04 DIAGNOSIS — T426X1A Poisoning by other antiepileptic and sedative-hypnotic drugs, accidental (unintentional), initial encounter: Secondary | ICD-10-CM | POA: Insufficient documentation

## 2016-04-04 DIAGNOSIS — Z8673 Personal history of transient ischemic attack (TIA), and cerebral infarction without residual deficits: Secondary | ICD-10-CM

## 2016-04-04 DIAGNOSIS — G301 Alzheimer's disease with late onset: Secondary | ICD-10-CM

## 2016-04-04 DIAGNOSIS — Z6829 Body mass index (BMI) 29.0-29.9, adult: Secondary | ICD-10-CM

## 2016-04-04 DIAGNOSIS — S299XXA Unspecified injury of thorax, initial encounter: Secondary | ICD-10-CM | POA: Diagnosis not present

## 2016-04-04 DIAGNOSIS — R531 Weakness: Secondary | ICD-10-CM | POA: Diagnosis not present

## 2016-04-04 DIAGNOSIS — E860 Dehydration: Secondary | ICD-10-CM | POA: Diagnosis present

## 2016-04-04 DIAGNOSIS — Z515 Encounter for palliative care: Secondary | ICD-10-CM

## 2016-04-04 DIAGNOSIS — I252 Old myocardial infarction: Secondary | ICD-10-CM

## 2016-04-04 DIAGNOSIS — Z7902 Long term (current) use of antithrombotics/antiplatelets: Secondary | ICD-10-CM

## 2016-04-04 DIAGNOSIS — R410 Disorientation, unspecified: Secondary | ICD-10-CM | POA: Diagnosis not present

## 2016-04-04 DIAGNOSIS — R0602 Shortness of breath: Secondary | ICD-10-CM | POA: Diagnosis not present

## 2016-04-04 DIAGNOSIS — N189 Chronic kidney disease, unspecified: Secondary | ICD-10-CM

## 2016-04-04 DIAGNOSIS — Z955 Presence of coronary angioplasty implant and graft: Secondary | ICD-10-CM

## 2016-04-04 LAB — MRSA PCR SCREENING: MRSA BY PCR: NEGATIVE

## 2016-04-04 LAB — URINALYSIS, COMPLETE (UACMP) WITH MICROSCOPIC
Bilirubin Urine: NEGATIVE
Glucose, UA: NEGATIVE mg/dL
HGB URINE DIPSTICK: NEGATIVE
Ketones, ur: NEGATIVE mg/dL
NITRITE: NEGATIVE
Protein, ur: 30 mg/dL — AB
RBC / HPF: NONE SEEN RBC/hpf (ref 0–5)
SPECIFIC GRAVITY, URINE: 1.019 (ref 1.005–1.030)
pH: 5 (ref 5.0–8.0)

## 2016-04-04 LAB — CBC WITH DIFFERENTIAL/PLATELET
BASOS ABS: 0 10*3/uL (ref 0–0.1)
BASOS PCT: 0 %
EOS ABS: 0.2 10*3/uL (ref 0–0.7)
Eosinophils Relative: 2 %
HCT: 31 % — ABNORMAL LOW (ref 35.0–47.0)
Hemoglobin: 10.4 g/dL — ABNORMAL LOW (ref 12.0–16.0)
Lymphocytes Relative: 6 %
Lymphs Abs: 0.6 10*3/uL — ABNORMAL LOW (ref 1.0–3.6)
MCH: 30 pg (ref 26.0–34.0)
MCHC: 33.7 g/dL (ref 32.0–36.0)
MCV: 89 fL (ref 80.0–100.0)
Monocytes Absolute: 1 10*3/uL — ABNORMAL HIGH (ref 0.2–0.9)
Monocytes Relative: 10 %
Neutro Abs: 8.3 10*3/uL — ABNORMAL HIGH (ref 1.4–6.5)
Neutrophils Relative %: 82 %
PLATELETS: 252 10*3/uL (ref 150–440)
RBC: 3.48 MIL/uL — AB (ref 3.80–5.20)
RDW: 17 % — ABNORMAL HIGH (ref 11.5–14.5)
WBC: 10.1 10*3/uL (ref 3.6–11.0)

## 2016-04-04 LAB — COMPREHENSIVE METABOLIC PANEL
ALK PHOS: 95 U/L (ref 38–126)
ALT: 7 U/L — AB (ref 14–54)
AST: 27 U/L (ref 15–41)
Albumin: 3 g/dL — ABNORMAL LOW (ref 3.5–5.0)
Anion gap: 10 (ref 5–15)
BUN: 45 mg/dL — ABNORMAL HIGH (ref 6–20)
CALCIUM: 8.3 mg/dL — AB (ref 8.9–10.3)
CO2: 25 mmol/L (ref 22–32)
CREATININE: 3.08 mg/dL — AB (ref 0.44–1.00)
Chloride: 101 mmol/L (ref 101–111)
GFR, EST AFRICAN AMERICAN: 15 mL/min — AB (ref >60)
GFR, EST NON AFRICAN AMERICAN: 13 mL/min — AB (ref >60)
Glucose, Bld: 95 mg/dL (ref 65–99)
Potassium: 4.7 mmol/L (ref 3.5–5.1)
Sodium: 136 mmol/L (ref 135–145)
TOTAL PROTEIN: 6.1 g/dL — AB (ref 6.5–8.1)
Total Bilirubin: 0.8 mg/dL (ref 0.3–1.2)

## 2016-04-04 LAB — TSH: TSH: 2.65 u[IU]/mL (ref 0.350–4.500)

## 2016-04-04 LAB — CREATININE, URINE, RANDOM: CREATININE, URINE: 181 mg/dL

## 2016-04-04 LAB — TROPONIN I

## 2016-04-04 LAB — SODIUM, URINE, RANDOM: SODIUM UR: 25 mmol/L

## 2016-04-04 MED ORDER — ENOXAPARIN SODIUM 30 MG/0.3ML ~~LOC~~ SOLN
30.0000 mg | SUBCUTANEOUS | Status: DC
Start: 2016-04-04 — End: 2016-04-04

## 2016-04-04 MED ORDER — MOMETASONE FURO-FORMOTEROL FUM 200-5 MCG/ACT IN AERO
2.0000 | INHALATION_SPRAY | Freq: Two times a day (BID) | RESPIRATORY_TRACT | Status: DC
Start: 2016-04-04 — End: 2016-04-07
  Administered 2016-04-06: 2 via RESPIRATORY_TRACT
  Filled 2016-04-04: qty 8.8

## 2016-04-04 MED ORDER — SODIUM CHLORIDE 0.9 % IV SOLN
INTRAVENOUS | Status: DC
  Administered 2016-04-04 – 2016-04-07 (×6): via INTRAVENOUS

## 2016-04-04 MED ORDER — LEVOTHYROXINE SODIUM 25 MCG PO TABS
25.0000 ug | ORAL_TABLET | Freq: Every day | ORAL | Status: DC
  Administered 2016-04-07: 25 ug via ORAL
  Filled 2016-04-04: qty 1

## 2016-04-04 MED ORDER — ACETAMINOPHEN 650 MG RE SUPP: 650.0000 mg | Freq: Four times a day (QID) | RECTAL | Status: DC | PRN

## 2016-04-04 MED ORDER — SODIUM CHLORIDE 0.9 % IV BOLUS (SEPSIS)
1000.0000 mL | Freq: Once | INTRAVENOUS | Status: AC
  Administered 2016-04-04: 1000 mL via INTRAVENOUS

## 2016-04-04 MED ORDER — PHENOL 1.4 % MT LIQD
1.0000 | OROMUCOSAL | Status: DC | PRN
  Administered 2016-04-05: 1 via OROMUCOSAL
  Filled 2016-04-04: qty 177

## 2016-04-04 MED ORDER — FAMOTIDINE 20 MG PO TABS
20.0000 mg | ORAL_TABLET | Freq: Every day | ORAL | Status: DC
  Administered 2016-04-06: 20 mg via ORAL
  Filled 2016-04-04: qty 1

## 2016-04-04 MED ORDER — CLOPIDOGREL BISULFATE 75 MG PO TABS
75.0000 mg | ORAL_TABLET | Freq: Every day | ORAL | Status: DC
Start: 2016-04-05 — End: 2016-04-07
  Administered 2016-04-07: 75 mg via ORAL
  Filled 2016-04-04: qty 1

## 2016-04-04 MED ORDER — ACETAMINOPHEN 325 MG PO TABS: 650.0000 mg | ORAL_TABLET | Freq: Four times a day (QID) | ORAL | Status: DC | PRN

## 2016-04-04 MED ORDER — ATORVASTATIN CALCIUM 20 MG PO TABS
40.0000 mg | ORAL_TABLET | Freq: Every day | ORAL | Status: DC
  Administered 2016-04-06: 40 mg via ORAL
  Filled 2016-04-04: qty 2

## 2016-04-04 MED ORDER — TIOTROPIUM BROMIDE MONOHYDRATE 18 MCG IN CAPS
18.0000 ug | ORAL_CAPSULE | Freq: Every day | RESPIRATORY_TRACT | Status: DC
  Administered 2016-04-06: 18 ug via RESPIRATORY_TRACT
  Filled 2016-04-04: qty 5

## 2016-04-04 MED ORDER — ONDANSETRON HCL 4 MG/2ML IJ SOLN: 4.0000 mg | Freq: Four times a day (QID) | INTRAMUSCULAR | Status: DC | PRN

## 2016-04-04 MED ORDER — ALBUTEROL SULFATE (2.5 MG/3ML) 0.083% IN NEBU
2.5000 mg | INHALATION_SOLUTION | Freq: Four times a day (QID) | RESPIRATORY_TRACT | Status: DC | PRN
Start: 2016-04-04 — End: 2016-04-07

## 2016-04-04 MED ORDER — ONDANSETRON HCL 4 MG PO TABS: 4.0000 mg | ORAL_TABLET | Freq: Four times a day (QID) | ORAL | Status: DC | PRN

## 2016-04-04 MED ORDER — HEPARIN SODIUM (PORCINE) 5000 UNIT/ML IJ SOLN
5000.0000 [IU] | Freq: Three times a day (TID) | INTRAMUSCULAR | Status: DC
  Administered 2016-04-05 – 2016-04-07 (×7): 5000 [IU] via SUBCUTANEOUS
  Filled 2016-04-04 (×7): qty 1

## 2016-04-04 MED ORDER — TRAZODONE HCL 50 MG PO TABS: 25.0000 mg | ORAL_TABLET | Freq: Every evening | ORAL | Status: DC | PRN

## 2016-04-04 MED ORDER — VITAMIN B-12 1000 MCG PO TABS
1000.0000 ug | ORAL_TABLET | Freq: Every day | ORAL | Status: DC
  Administered 2016-04-07: 1000 ug via ORAL
  Filled 2016-04-04: qty 1

## 2016-04-04 MED ORDER — SODIUM CHLORIDE 0.9 % IV BOLUS (SEPSIS): 500.0000 mL | Freq: Once | INTRAVENOUS | Status: DC

## 2016-04-04 MED ORDER — SODIUM CHLORIDE 0.9 % IV BOLUS (SEPSIS)
500.0000 mL | Freq: Once | INTRAVENOUS | Status: AC
Start: 2016-04-04 — End: 2016-04-04
  Administered 2016-04-04: 500 mL via INTRAVENOUS

## 2016-04-04 MED ORDER — NITROGLYCERIN 0.4 MG SL SUBL: 0.4000 mg | SUBLINGUAL_TABLET | SUBLINGUAL | Status: DC | PRN

## 2016-04-04 NOTE — H&P (Signed)
[place] at [place] NAME: Joy [name]    MR#:  [id number]  DATE OF BIRTH:  23-Dec-1930  DATE OF ADMISSION:  04/04/2016  PRIMARY CARE PHYSICIAN: Arnette Norris, MD   REQUESTING/REFERRING PHYSICIAN:   CHIEF COMPLAINT:   Chief Complaint  Patient presents with  . Fall    HISTORY OF PRESENT ILLNESS: Joy [name]  is a 83 y.o. female with a known history of Long-standing tobacco abuse, COPD, coronary artery disease, depression, diastolic dysfunction, who presents to the hospital with complaints of low blood pressure, confusion, akathisia , lower extremity movements. Apparently patient was felt to be dehydrated and very weak and was noted to be hypertensive in the facility. She was also more confused and very restless. She had some jerking movements in her upper and lower extremities,fiddling with sheets with her  fingers , she was brought to emergency room where she was noted to be in acute on chronic renal failure, creatinine level increased from 1.78-3.08 from October 2017 until now. Hospitalist services were contacted for admission. Patient denies any discomfort, denies any pain    PAST MEDICAL HISTORY:   Past Medical History:  Diagnosis Date  . Alzheimer disease   . Anxiety   . CAD (coronary artery disease)    h/o NSTEMI, LAD stent placement 10/04, cath 05/11/2010  . Carotid artery occlusion   . COPD (chronic obstructive pulmonary disease) ([place])   . Depression   . Diastolic dysfunction    Echo 05/11/2010, EF 65-70%  . HLD (hyperlipidemia)   . HTN (hypertension)   . Hypertension   . Kidney infection   . PMR (polymyalgia rheumatica) (HCC)    with h/o steroid induced myopathy  . Thyroid disease   . TIA (transient ischemic attack)    11/05 s/p L CEA    PAST SURGICAL HISTORY: Past Surgical History:  Procedure Laterality Date  . CARDIAC CATHETERIZATION     unc  . CARDIAC CATHETERIZATION    . CARDIAC CATHETERIZATION    .  CAROTID ENDARTERECTOMY     left with stent placement 11/2003  . ILIAC ARTERY STENT     right, 2001  . LUNG BIOPSY      SOCIAL HISTORY:  Social History  Substance Use Topics  . Smoking status: Former Smoker    Packs/day: 1.00    Years: 25.00    Types: Cigarettes    Quit date: 01/05/1996  . Smokeless tobacco: Never Used  . Alcohol use No    FAMILY HISTORY: History reviewed. No pertinent family history.  DRUG ALLERGIES:  Allergies  Allergen Reactions  . Codeine Other (See Comments)    Coma for 2 days  . Prednisone Other (See Comments)    Weight gain,puffy face  . Shellfish Allergy     Review of Systems  Unable to perform ROS: Dementia    MEDICATIONS AT HOME:  Prior to Admission medications   Medication Sig Start Date End Date Taking? Authorizing Provider  albuterol (PROVENTIL HFA;VENTOLIN HFA) 108 (90 Base) MCG/ACT inhaler Inhale 2 puffs into the lungs every 6 (six) hours as needed for wheezing or shortness of breath. 04/17/15  Yes Joanne Gavel, MD  albuterol (PROVENTIL) (2.5 MG/3ML) 0.083% nebulizer solution Take 3 mLs (2.5 mg total) by nebulization every 6 (six) hours as needed for wheezing or shortness of breath. 04/28/15  Yes Lucille Passy, MD  nitroGLYCERIN (NITROSTAT) 0.4 MG SL tablet Place 0.4 mg under the tongue every 5 (five) minutes as  needed.     Yes Historical Provider, MD  traZODone (DESYREL) 50 MG tablet TAKE 1/2 TO 1 TABLET AT BEDTIME AS NEEDED FOR SLEEP 07/10/15  Yes Lucille Passy, MD  ADVAIR DISKUS 250-50 MCG/DOSE AEPB INHALE 1 PUFF BY MOUTH TWICE A DAY 09/29/15   Lucille Passy, MD  amLODipine (NORVASC) 5 MG tablet Take 1 tablet (5 mg total) by mouth daily. 07/15/15   Wellington Hampshire, MD  atorvastatin (LIPITOR) 40 MG tablet TAKE 1 TABLET BY MOUTH EVERY DAY 08/14/15   Lucille Passy, MD  clopidogrel (PLAVIX) 75 MG tablet Take 1 tablet (75 mg total) by mouth daily. 05/22/15   Lucille Passy, MD  gabapentin (NEURONTIN) 800 MG tablet TAKE 1 TABLET BY MOUTH TWICE A DAY 05/05/15    Lucille Passy, MD  levothyroxine (SYNTHROID, LEVOTHROID) 25 MCG tablet Take 25 mcg by mouth daily before breakfast.    Historical Provider, MD  lisinopril (PRINIVIL,ZESTRIL) 10 MG tablet Take 1 tablet (10 mg total) by mouth daily. 07/15/15   Wellington Hampshire, MD  oseltamivir (TAMIFLU) 75 MG capsule Take 1 capsule (75 mg total) by mouth daily. Patient not taking: Reported on 04/04/2016 02/17/16   Lucille Passy, MD  ranitidine (ZANTAC) 300 MG tablet TAKE 1 TABLET (300 MG TOTAL) BY MOUTH AT BEDTIME. 05/26/15   Lucille Passy, MD  sertraline (ZOLOFT) 100 MG tablet TAKE 1 TABLET BY MOUTH EVERY DAY 10/03/15   Lucille Passy, MD  SPIRIVA HANDIHALER 18 MCG inhalation capsule PLACE 1 CAPSULE (18 MCG TOTAL) INTO INHALER AND INHALE DAILY. 06/03/15   Lucille Passy, MD  vitamin B-12 (CYANOCOBALAMIN) 1000 MCG tablet Take 1,000 mcg by mouth daily.    Historical Provider, MD  Vitamin D, Ergocalciferol, (DRISDOL) 50000 units CAPS capsule Take 50,000 Units by mouth every 7 (seven) days.    Historical Provider, MD      PHYSICAL EXAMINATION:   VITAL SIGNS: Blood pressure (!) 103/54, pulse 76, temperature 97.8 F (36.6 C), temperature source Oral, resp. rate 20, weight 93.4 kg (206 lb), SpO2 95 %.  GENERAL:  83 y.o.-year-old patient lying in the bed In mild to moderate distress, moving constantly, but it is less in the bed, visiting with her sheets, having numerous myoclonus, jerking, jumping in upper lobes extremities, head, sucking movements of the lips.  EYES: Pupils equal, round, reactive to light and accommodation. No scleral icterus. Extraocular muscles intact.  HEENT: Head atraumatic, normocephalic. Oropharynx and nasopharynx clear.  NECK:  Supple, no jugular venous distention. No thyroid enlargement, no tenderness.  LUNGS: Normal breath sounds bilaterally, no wheezing, rales,rhonchi or crepitation. No use of accessory muscles of respiration.  CARDIOVASCULAR: S1, S2 normal. No murmurs, rubs, or gallops.  ABDOMEN: Soft,  nontender, nondistended. Bowel sounds present. No organomegaly or mass.  EXTREMITIES: No pedal edema, cyanosis, or clubbing.  NEUROLOGIC: Cranial nerves II through XII are grossly intact. Muscle strength 4/5 in all extremities. Sensation unable to evaluate. Gait not checked. Constant movements, myoclonus is, jerking, jumping in lower extremities as well as upper extremities PSYCHIATRIC: The patient is somnolent, doesn't open eyes, however, able to answer a few questions yes or no, unable to assess orientation .  SKIN: No obvious rash, lesion, or ulcer.   LABORATORY PANEL:   CBC  Recent Labs Lab 04/04/16 1613  WBC 10.1  HGB 10.4*  HCT 31.0*  PLT 252  MCV 89.0  MCH 30.0  MCHC 33.7  RDW 17.0*  LYMPHSABS 0.6*  MONOABS 1.0*  EOSABS 0.2  BASOSABS 0.0   ------------------------------------------------------------------------------------------------------------------  Chemistries   Recent Labs Lab 04/04/16 1613  NA 136  K 4.7  CL 101  CO2 25  GLUCOSE 95  BUN 45*  CREATININE 3.08*  CALCIUM 8.3*  AST 27  ALT 7*  ALKPHOS 95  BILITOT 0.8   ------------------------------------------------------------------------------------------------------------------  Cardiac Enzymes  Recent Labs Lab 04/04/16 1613  TROPONINI <0.03   ------------------------------------------------------------------------------------------------------------------  RADIOLOGY: Dg Chest 1 View  Result Date: 04/04/2016 CLINICAL DATA:  Fall.  Trauma. EXAM: CHEST 1 VIEW COMPARISON:  06/26/2015 chest radiograph. FINDINGS: Stable cardiomediastinal silhouette with top-normal heart size and aortic atherosclerosis. No pneumothorax. No pleural effusion. No pulmonary edema. Patchy basilar predominant reticular opacities in both lungs appear stable. Emphysema. No acute consolidative airspace disease. No displaced fractures. IMPRESSION: 1. No acute pulmonary disease. 2. Stable patchy basilar predominant reticular  opacities in both lungs, which may indicate an interstitial lung disease. 3. Aortic atherosclerosis. Electronically Signed   By: Ilona Sorrel M.D.   On: 04/04/2016 16:31   Ct Head Wo Contrast  Result Date: 04/04/2016 CLINICAL DATA:  Fall.  Head injury confusion EXAM: CT HEAD WITHOUT CONTRAST TECHNIQUE: Contiguous axial images were obtained from the base of the skull through the vertex without intravenous contrast. COMPARISON:  CT head 08/09/2015 FINDINGS: Brain: Cerebral atrophy unchanged. Mild chronic ischemic change in the white matter stable. Negative for acute infarct.  Negative for acute hemorrhage or mass. Vascular: Negative for hyperdense vessel Skull: Negative Sinuses/Orbits: Negative Other: None IMPRESSION: No acute intracranial abnormality. Atrophy with mild chronic ischemic change in the white matter. Electronically Signed   By: Franchot Gallo M.D.   On: 04/04/2016 16:10    EKG: Orders placed or performed during the hospital encounter of 04/04/16  . ED EKG  . ED EKG  . ED EKG  . ED EKG  . EKG 12-Lead  . EKG 12-Lead    IMPRESSION AND PLAN:  Active Problems:   Hypotension   Acute on chronic renal failure (HCC)   Gabapentin-induced toxicity  #1. Hypotension, continue patient on IV fluids, hold blood pressure medications, suspected dehydration related #2. Acute on chronic renal failure, underlying CK D stage III, no obvious infection, continue IV fluids, follow creatinine in the morning #3. Gabapentin-induced toxicity, hold gabapentin #4. Failure to thrive, adult, get dietary involved for calorie count, likely dementia related  All the records are reviewed and case discussed with ED provider. Management plans discussed with the patient, family and they are in agreement.  CODE STATUS: Code Status History    Date Active Date Inactive Code Status Order ID Comments User Context   10/04/2015  4:00 AM 10/06/2015  6:59 PM Full Code [id number]  Saundra Shelling, MD Inpatient        TOTAL TIME TAKING CARE OF THIS PATIENT: 50 minutes.    Theodoro Grist M.D on 04/04/2016 at 6:04 PM  Between 7am to 6pm - Pager - [phone] After 6pm go to [email or web address] - password [email or web address] [facility]  Office  [phone]  CC: Primary care physician; Arnette Norris, MD

## 2016-04-04 NOTE — ED Provider Notes (Signed)
[facility] Emergency Department Provider Note  ____________________________________________   First MD Initiated Contact with Patient 04/04/16 1546     (approximate)  I have reviewed the triage vital signs and the nursing notes.   HISTORY  Chief Complaint Fall    HPI Joy [name] is a 83 y.o. female is brought to the emergency department via EMS after feeling lightheaded and falling today at her nursing home and striking the back of her head. She did not syncopized. She remembers all events. She has been treated with ciprofloxacin for urinary tract infection recently but for the past week or so has felt generally weak and tired. She said she stood up and shortly thereafter felt weak and collapsed to the ground. She is taking Plavix but no other blood thinners. She has mild discomfort in her upper chest after the fall but no shortness of breath. No abdominal pain nausea or vomiting. No leg pain or arm pain.   Past Medical History:  Diagnosis Date  . Alzheimer disease   . Anxiety   . CAD (coronary artery disease)    h/o NSTEMI, LAD stent placement 10/04, cath 05/11/2010  . Carotid artery occlusion   . COPD (chronic obstructive pulmonary disease) ([place])   . Depression   . Diastolic dysfunction    Echo 05/11/2010, EF 65-70%  . HLD (hyperlipidemia)   . HTN (hypertension)   . Hypertension   . Kidney infection   . PMR (polymyalgia rheumatica) (HCC)    with h/o steroid induced myopathy  . Thyroid disease   . TIA (transient ischemic attack)    11/05 s/p L CEA    Patient Active Problem List   Diagnosis Date Noted  . Acute on chronic renal failure ([place]) 10/13/2015  . Lower urinary tract infectious disease 10/04/2015  . Altered mental state 10/04/2015  . Hypotension 05/05/2015  . Pedal edema 05/05/2015  . COPD exacerbation ([place]) 04/25/2015  . Left lower lobe pneumonia ([place]) 04/25/2015  . Chest pain 02/13/2015  . Allergic rhinitis 11/04/2014  .  Chronic diastolic heart failure ([place]) 07/22/2014  . CKD (chronic kidney disease) stage 3, GFR 30-59 ml/min 06/11/2014  . Carotid artery disease ([place]) 04/08/2014  . Nevus of cheek 10/30/2013  . CAD (coronary artery disease)   . HLD (hyperlipidemia)   . Lung nodule 01/15/2013  . Memory loss 01/15/2013  . Weakness generalized 12/26/2012  . Incontinence 12/26/2012  . GERD (gastroesophageal reflux disease) 09/06/2012  . Neuropathy ([place]) 01/14/2011  . Insomnia 01/14/2011  . MELAS (mitochondrial encephalopathy, lactic acidosis and stroke-like episodes) ([place]) 11/02/2010  . HTN (hypertension)   . COPD (chronic obstructive pulmonary disease) ([place])   . Depression   . Thyroid disease     Past Surgical History:  Procedure Laterality Date  . CARDIAC CATHETERIZATION     unc  . CARDIAC CATHETERIZATION    . CARDIAC CATHETERIZATION    . CAROTID ENDARTERECTOMY     left with stent placement 11/2003  . ILIAC ARTERY STENT     right, 2001  . LUNG BIOPSY      Prior to Admission medications   Medication Sig Start Date End Date Taking? Authorizing Provider  albuterol (PROVENTIL HFA;VENTOLIN HFA) 108 (90 Base) MCG/ACT inhaler Inhale 2 puffs into the lungs every 6 (six) hours as needed for wheezing or shortness of breath. 04/17/15  Yes Joanne Gavel, MD  albuterol (PROVENTIL) (2.5 MG/3ML) 0.083% nebulizer solution Take 3 mLs (2.5 mg total) by nebulization every 6 (six) hours as needed  for wheezing or shortness of breath. 04/28/15  Yes Lucille Passy, MD  nitroGLYCERIN (NITROSTAT) 0.4 MG SL tablet Place 0.4 mg under the tongue every 5 (five) minutes as needed.     Yes Historical Provider, MD  traZODone (DESYREL) 50 MG tablet TAKE 1/2 TO 1 TABLET AT BEDTIME AS NEEDED FOR SLEEP 07/10/15  Yes Lucille Passy, MD  ADVAIR DISKUS 250-50 MCG/DOSE AEPB INHALE 1 PUFF BY MOUTH TWICE A DAY 09/29/15   Lucille Passy, MD  amLODipine (NORVASC) 5 MG tablet Take 1 tablet (5 mg total) by mouth daily. 07/15/15   Wellington Hampshire, MD    atorvastatin (LIPITOR) 40 MG tablet TAKE 1 TABLET BY MOUTH EVERY DAY 08/14/15   Lucille Passy, MD  clopidogrel (PLAVIX) 75 MG tablet Take 1 tablet (75 mg total) by mouth daily. 05/22/15   Lucille Passy, MD  gabapentin (NEURONTIN) 800 MG tablet TAKE 1 TABLET BY MOUTH TWICE A DAY 05/05/15   Lucille Passy, MD  levothyroxine (SYNTHROID, LEVOTHROID) 25 MCG tablet Take 25 mcg by mouth daily before breakfast.    Historical Provider, MD  lisinopril (PRINIVIL,ZESTRIL) 10 MG tablet Take 1 tablet (10 mg total) by mouth daily. 07/15/15   Wellington Hampshire, MD  oseltamivir (TAMIFLU) 75 MG capsule Take 1 capsule (75 mg total) by mouth daily. Patient not taking: Reported on 04/04/2016 02/17/16   Lucille Passy, MD  ranitidine (ZANTAC) 300 MG tablet TAKE 1 TABLET (300 MG TOTAL) BY MOUTH AT BEDTIME. 05/26/15   Lucille Passy, MD  sertraline (ZOLOFT) 100 MG tablet TAKE 1 TABLET BY MOUTH EVERY DAY 10/03/15   Lucille Passy, MD  SPIRIVA HANDIHALER 18 MCG inhalation capsule PLACE 1 CAPSULE (18 MCG TOTAL) INTO INHALER AND INHALE DAILY. 06/03/15   Lucille Passy, MD  vitamin B-12 (CYANOCOBALAMIN) 1000 MCG tablet Take 1,000 mcg by mouth daily.    Historical Provider, MD  Vitamin D, Ergocalciferol, (DRISDOL) 50000 units CAPS capsule Take 50,000 Units by mouth every 7 (seven) days.    Historical Provider, MD    Allergies Codeine; Prednisone; and Shellfish allergy  History reviewed. No pertinent family history.  Social History Social History  Substance Use Topics  . Smoking status: Former Smoker    Packs/day: 1.00    Years: 25.00    Types: Cigarettes    Quit date: 01/05/1996  . Smokeless tobacco: Never Used  . Alcohol use No    Review of Systems Constitutional: No fever/chills Eyes: No visual changes. ENT: No sore throat. Cardiovascular: Positive chest pain. Respiratory: Denies shortness of breath. Gastrointestinal: No abdominal pain.  No nausea, no vomiting.  No diarrhea.  No constipation. Genitourinary: Negative for  dysuria. Musculoskeletal: Negative for back pain. Skin: Negative for rash. Neurological: Negative for headaches, focal weakness or numbness.  10-point ROS otherwise negative.  ____________________________________________   PHYSICAL EXAM:  VITAL SIGNS: ED Triage Vitals  Enc Vitals Group     BP      Pulse      Resp      Temp      Temp src      SpO2      Weight      Height      Head Circumference      Peak Flow      Pain Score      Pain Loc      Pain Edu?      Excl. in [place]?     Constitutional: Pleasant cooperative well-appearing  speaks in slow methodical sentences Eyes: PERRL EOMI. Head: Hematoma to the left occipital vertex no palpable fractures Nose: No congestion/rhinnorhea. Mouth/Throat: NNo cervical spine tenderness to palpation. Cardiovascular: Normal rate, regular rhythm. Grossly normal heart sounds.  Good peripheral circulation. Chest wall stable Respiratory: Normal respiratory effort.  No retractions. Lungs CTAB and moving good air Gastrointestinal: Soft nondistended nontender no rebound no guarding no peritonitis no McBurney's tenderness negative Rovsing's no costovertebral tenderness negative Murphy's Musculoskeletal: No bony tenderness or instability Neurologic:  Normal speech and language. No gross focal neurologic deficits are appreciated. Skin:  Skin is warm, dry and intact. No rash noted. Psychiatric: Mood and affect are normal. Speech and behavior are normal.    ____________________________________________   DIFFERENTIAL  Cerebral hemorrhage, vasovagal syncope, cardiogenic syncope, cervical spine fracture ____________________________________________   LABS (all labs ordered are listed, but only abnormal results are displayed)  Labs Reviewed  COMPREHENSIVE METABOLIC PANEL - Abnormal; Notable for the following:       Result Value   BUN 45 (*)    Creatinine, Ser 3.08 (*)    Calcium 8.3 (*)    Total Protein 6.1 (*)    Albumin 3.0 (*)    ALT 7  (*)    GFR calc non Af Amer 13 (*)    GFR calc Af Amer 15 (*)    All other components within normal limits  CBC WITH DIFFERENTIAL/PLATELET - Abnormal; Notable for the following:    RBC 3.48 (*)    Hemoglobin 10.4 (*)    HCT 31.0 (*)    RDW 17.0 (*)    Neutro Abs 8.3 (*)    Lymphs Abs 0.6 (*)    Monocytes Absolute 1.0 (*)    All other components within normal limits  URINALYSIS, COMPLETE (UACMP) WITH MICROSCOPIC - Abnormal; Notable for the following:    Color, Urine AMBER (*)    APPearance HAZY (*)    Protein, ur 30 (*)    Leukocytes, UA TRACE (*)    Bacteria, UA MANY (*)    Squamous Epithelial / LPF 0-5 (*)    All other components within normal limits  URINE CULTURE  TROPONIN I  TSH  CREATININE, URINE, RANDOM  SODIUM, URINE, RANDOM    GFR down to 13  EKG   ____________________________________________  RADIOLOGY  No acute traumatic injury   PROCEDURES  Procedure(s) performed: no  Procedures  Critical Care performed: no  ____________________________________________   INITIAL IMPRESSION / ASSESSMENT AND PLAN / ED COURSE  Pertinent labs & imaging results that were available during my care of the patient were reviewed by me and considered in my medical decision making (see chart for details).  The patient arrives well-appearing with generalized fatigue for the past week or so. CT x-ray line and labs pending.    ----------------------------------------- 5:29 PM on 04/04/2016 ----------------------------------------- Fortunately the patient's head CT and chest x-ray show no acute traumatic injury, however she unexpectedly has an acute kidney injury with a drop in her GFR from 39 to 13.  Urine lytes will be added for a FeNa and I'll hydrate her more, but she'll require inpatient admission for evaluation of the renal failure.   ____________________________________________   FINAL CLINICAL IMPRESSION(S) / ED DIAGNOSES  Final diagnoses:  Acute kidney injury  ([place])      NEW MEDICATIONS STARTED DURING THIS VISIT:  New Prescriptions   No medications on file     Note:  This document was prepared using Dragon voice recognition software and may include  unintentional dictation errors.     Darel Hong, MD 04/04/16 [phone]

## 2016-04-04 NOTE — Progress Notes (Signed)
Pharmacist - Prescriber Communication  Enoxaparin 30 mg subcutaneously once daily has been changed to heparin 5000 units subcutaneously three times daily due to creatinine clearance less than 15 mL/min.   Jurell Basista A. [place], [place].D., BCPS Clinical Pharmacist 04/04/16 21:05

## 2016-04-04 NOTE — ED Notes (Signed)
Pt arrived to ED reporting a fall today after attempting to pick up a tissue under bed. Pt denies tripping and reports having a memory of the fall but is anxious when relaying story and reports having a hard time remembering details.

## 2016-04-04 NOTE — ED Notes (Signed)
Pt taken to radiology. Triage will be completed upon pt return to treatment room. Pts family in room at this time.

## 2016-04-05 ENCOUNTER — Inpatient Hospital Stay: Payer: Medicare Other

## 2016-04-05 LAB — GLUCOSE, CAPILLARY: GLUCOSE-CAPILLARY: 80 mg/dL (ref 65–99)

## 2016-04-05 LAB — CBC
HCT: 32.3 % — ABNORMAL LOW (ref 35.0–47.0)
Hemoglobin: 10.6 g/dL — ABNORMAL LOW (ref 12.0–16.0)
MCH: 29.7 pg (ref 26.0–34.0)
MCHC: 32.8 g/dL (ref 32.0–36.0)
MCV: 90.5 fL (ref 80.0–100.0)
PLATELETS: 250 10*3/uL (ref 150–440)
RBC: 3.56 MIL/uL — ABNORMAL LOW (ref 3.80–5.20)
RDW: 16.4 % — AB (ref 11.5–14.5)
WBC: 10 10*3/uL (ref 3.6–11.0)

## 2016-04-05 LAB — BLOOD GAS, ARTERIAL
Acid-base deficit: 7.1 mmol/L — ABNORMAL HIGH (ref 0.0–2.0)
BICARBONATE: 19.7 mmol/L — AB (ref 20.0–28.0)
FIO2: 0.55
O2 SAT: 92.4 %
PATIENT TEMPERATURE: 37
PO2 ART: 75 mmHg — AB (ref 83.0–108.0)
pCO2 arterial: 44 mmHg (ref 32.0–48.0)
pH, Arterial: 7.26 — ABNORMAL LOW (ref 7.350–7.450)

## 2016-04-05 LAB — BASIC METABOLIC PANEL
Anion gap: 11 (ref 5–15)
BUN: 40 mg/dL — AB (ref 6–20)
CALCIUM: 8.1 mg/dL — AB (ref 8.9–10.3)
CHLORIDE: 107 mmol/L (ref 101–111)
CO2: 21 mmol/L — ABNORMAL LOW (ref 22–32)
CREATININE: 2.08 mg/dL — AB (ref 0.44–1.00)
GFR calc Af Amer: 24 mL/min — ABNORMAL LOW (ref >60)
GFR calc non Af Amer: 21 mL/min — ABNORMAL LOW (ref >60)
GLUCOSE: 82 mg/dL (ref 65–99)
POTASSIUM: 4.8 mmol/L (ref 3.5–5.1)
Sodium: 139 mmol/L (ref 135–145)

## 2016-04-05 MED ORDER — BUPROPION HCL ER (XL) 150 MG PO TB24
150.0000 mg | ORAL_TABLET | Freq: Every day | ORAL | Status: DC
  Administered 2016-04-07: 150 mg via ORAL
  Filled 2016-04-05: qty 1

## 2016-04-05 MED ORDER — AMLODIPINE BESYLATE 5 MG PO TABS: 5.0000 mg | ORAL_TABLET | Freq: Every day | ORAL | Status: DC

## 2016-04-05 MED ORDER — FUROSEMIDE 10 MG/ML IJ SOLN
40.0000 mg | Freq: Once | INTRAMUSCULAR | Status: AC
  Administered 2016-04-05: 40 mg via INTRAVENOUS
  Filled 2016-04-05: qty 4

## 2016-04-05 MED ORDER — SERTRALINE HCL 100 MG PO TABS
100.0000 mg | ORAL_TABLET | Freq: Every day | ORAL | Status: DC
  Administered 2016-04-07: 100 mg via ORAL
  Filled 2016-04-05: qty 1

## 2016-04-05 MED ORDER — MEMANTINE HCL 5 MG PO TABS
10.0000 mg | ORAL_TABLET | Freq: Two times a day (BID) | ORAL | Status: DC
  Administered 2016-04-06 – 2016-04-07 (×2): 10 mg via ORAL
  Filled 2016-04-05 (×2): qty 2

## 2016-04-05 NOTE — Progress Notes (Signed)
Text paged MD to review ABGs that have resulted.

## 2016-04-05 NOTE — Progress Notes (Signed)
Pt not alert enough to take her scheduled this am, MD Mody notified. No new orders

## 2016-04-05 NOTE — Evaluation (Signed)
Clinical/Bedside Swallow Evaluation Patient Details  Name: Joy [name] MRN: [id number] Date of Birth: 26-Dec-1930  Today's Date: 04/05/2016 Time: SLP Start Time (ACUTE ONLY): 1140 SLP Stop Time (ACUTE ONLY): 1205 SLP Time Calculation (min) (ACUTE ONLY): 25 min  Past Medical History:  Past Medical History:  Diagnosis Date  . Alzheimer disease   . Anxiety   . CAD (coronary artery disease)    h/o NSTEMI, LAD stent placement 10/04, cath 05/11/2010  . Carotid artery occlusion   . COPD (chronic obstructive pulmonary disease) ([place])   . Depression   . Diastolic dysfunction    Echo 05/11/2010, EF 65-70%  . HLD (hyperlipidemia)   . HTN (hypertension)   . Hypertension   . Kidney infection   . PMR (polymyalgia rheumatica) ([facility])    with h/o steroid induced myopathy  . Thyroid disease   . TIA (transient ischemic attack)    11/05 s/p L CEA   Past Surgical History:  Past Surgical History:  Procedure Laterality Date  . CARDIAC CATHETERIZATION     unc  . CARDIAC CATHETERIZATION    . CARDIAC CATHETERIZATION    . CAROTID ENDARTERECTOMY     left with stent placement 11/2003  . ILIAC ARTERY STENT     right, 2001  . LUNG BIOPSY     HPI:      Assessment / Plan / Recommendation Clinical Impression  Pt presents with a marked oral pharyngeal dysphagia and severely comprimised respiratory status. pt is on O2 mask at this time and continuously needs reinforcement to leave it on. pt stats drop quickly without mask. pt able to intake  few bites of puree and liquid however respirations increase and pt objective signs of pain and discomfort with each swallow. intake was discontinued and SLP recommended to disocntinue po at this time. NSG and family notified and agreed to current plan.  SLP Visit Diagnosis: Dysphagia, oropharyngeal phase (R13.12)    Aspiration Risk  Risk for inadequate nutrition/hydration;Severe aspiration risk    Diet Recommendation NPO        Other  Recommendations Oral Care  Recommendations: Oral care BID Other Recommendations: Remove water pitcher   Follow up Recommendations        Frequency and Duration min 4x/week  2 weeks       Prognosis Prognosis for Safe Diet Advancement: Fair Barriers to Reach Goals: Cognitive deficits;Severity of deficits      Swallow Study   General Date of Onset: 04/05/16 Type of Study: Bedside Swallow Evaluation Diet Prior to this Study: Dysphagia 2 (chopped);Thin liquids Temperature Spikes Noted: N/A Respiratory Status: Venti-mask Behavior/Cognition: Alert;Cooperative;Confused;Agitated Oral Cavity Assessment: Within Functional Limits Oral Care Completed by SLP: No Oral Cavity - Dentition: Adequate natural dentition Vision: Functional for self-feeding Self-Feeding Abilities: Able to feed self Patient Positioning: Upright in bed Baseline Vocal Quality: Breathy Volitional Cough: Weak Volitional Swallow: Unable to elicit    Oral/Motor/Sensory Function Overall Oral Motor/Sensory Function: Within functional limits   Ice Chips Ice chips: Within functional limits Presentation: Spoon   Thin Liquid Thin Liquid: Impaired Presentation: Cup;Spoon Oral Phase Impairments: Impaired mastication;Reduced lingual movement/coordination Oral Phase Functional Implications: Oral holding;Prolonged oral transit Pharyngeal  Phase Impairments: Wet Vocal Quality;Throat Clearing - Immediate;Cough - Immediate    Nectar Thick Nectar Thick Liquid: Not tested   Honey Thick Honey Thick Liquid: Impaired Presentation: Cup;Spoon Oral Phase Functional Implications: Prolonged oral transit Pharyngeal Phase Impairments: Suspected delayed Swallow;Wet Vocal Quality;Throat Clearing - Immediate;Cough - Immediate   Puree Puree: Impaired Oral Phase Impairments: Impaired  mastication Oral Phase Functional Implications: Prolonged oral transit Pharyngeal Phase Impairments: Suspected delayed Swallow;Cough - Immediate   Solid   GO   Solid: Not tested         [place] Sauber 04/05/2016,2:20 PM

## 2016-04-05 NOTE — Progress Notes (Addendum)
Initial Nutrition Assessment  DOCUMENTATION CODES:   Not applicable  INTERVENTION:  1. Monitor for GOC vs Diet advancement vs Nutrition Support - generally would not place tubes in patient's with dementia but patient continues to be full code. Rec NPO by SLP eval related to desat when of venturi mask.  NUTRITION DIAGNOSIS:   Inadequate oral intake related to poor appetite, inability to eat as evidenced by per patient/family report, NPO status.  GOAL:   Patient will meet greater than or equal to 90% of their needs  MONITOR:   I & O's, Weight trends, Labs, Diet advancement  REASON FOR ASSESSMENT:   Consult   ASSESSMENT:   Joy [name]  is a 83 y.o. female with a known history of Long-standing tobacco abuse, COPD, coronary artery disease, depression, diastolic dysfunction, who presents to the hospital with complaints of low blood pressure, confusion, akathisia , lower extremity movements  Spoke with patient's family at bedside. Daughter states patient was eating well PTA at [facility] with no issues chewing/swallowing or maintaining respiratory status. Ate nothing today.  On venturi mask - anxious and confused. Does not consume ensure at facility. 30#/15% severe wt loss over 6 months per chart - family was unsure of this.  Nutrition-Focused physical exam completed. Findings are no fat depletion, no muscle depletion, and no edema.   Continue to monitor. Labs and medications reviewed: B12 NS @ 25mL/hr  Diet Order:  DIET DYS 2 Room service appropriate? Yes; Fluid consistency: Thin  Skin:  Reviewed, no issues  Last BM:  04/04/2016  Height:   Ht Readings from Last 1 Encounters:  04/04/16 5\' 5"  (1.651 m)    Weight:   Wt Readings from Last 1 Encounters:  04/05/16 176 lb 5.9 oz (80 kg)    Ideal Body Weight:  56.81 kg  BMI:  Body mass index is 29.35 kg/m.  Estimated Nutritional Needs:   Kcal:  1600-2000 calories  Protein:  80-96 gm  Fluid:  >/=  1.6L  EDUCATION NEEDS:   No education needs identified at this time  Satira Anis. Yehoshua Vitelli, MS, RD LDN Inpatient Clinical Dietitian Pager [phone]

## 2016-04-05 NOTE — Progress Notes (Signed)
PT Cancellation Note  Patient Details Name: Dula Havlik MRN: [id number] DOB: 10-29-1930   Cancelled Treatment:    Reason Eval/Treat Not Completed: Patient not medically ready. Pt placed on nonrebreather this morning due to O2 sats in the low 80s. Discussed pt status with nursing, RN recomends PT hold until pt respiratory status improves. Will evaluate pt at a later date/time as medically appropriate.     Shatasha Lambing 04/05/2016, 10:40 AM

## 2016-04-05 NOTE — Progress Notes (Signed)
PT Cancellation Note  Patient Details Name: Joy [name] MRN: [id number] DOB: March 02, 1930   Cancelled Treatment:    Reason Eval/Treat Not Completed: Patient declined, no reason specified. RN reported pt medically stable for PT. Pt declined working with PT saying she is "too tired and just not today." Will reattempt PT eval at later date/time as medically appropriate.    Borna Wessinger, SPT 04/05/2016, 3:09 PM

## 2016-04-05 NOTE — Progress Notes (Signed)
On assessment pts O2 saturation were 82% on 3L nasal cannula. Pt unable to bring O2 above 90%. Respiratory notified, Amy RT at bedside, pt placed on non venti mask 55%. Pt O2 now 93% on venti mask. MD Mody notified of the above.

## 2016-04-05 NOTE — Progress Notes (Signed)
Verbal order by MD Mody for 40 mg IV lasix Once, and ABG lab X1. Order placed.

## 2016-04-05 NOTE — NC FL2 (Signed)
[place] LEVEL OF CARE SCREENING TOOL     IDENTIFICATION  Patient Name: Joy [name] Birthdate: Sep 10, 1930 Sex: female Admission Date (Current Location): 04/04/2016  [place] and [place] Number:  Engineering geologist and Address:  [facility], [address] [place], [place] [address]      Provider Number: [id number]  Attending Physician Name and Address:  Bettey Costa, MD  Relative Name and Phone Number:       Current Level of Care: Hospital Recommended Level of Care: [place] Prior Approval Number:    Date Approved/Denied:   PASRR Number:  [id number])  Discharge Plan: SNF    Current Diagnoses: Patient Active Problem List   Diagnosis Date Noted  . Gabapentin-induced toxicity 04/04/2016  . Acute on chronic renal failure ([place]) 10/13/2015  . Lower urinary tract infectious disease 10/04/2015  . Altered mental state 10/04/2015  . Hypotension 05/05/2015  . Pedal edema 05/05/2015  . COPD exacerbation ([place]) 04/25/2015  . Left lower lobe pneumonia ([place]) 04/25/2015  . Chest pain 02/13/2015  . Allergic rhinitis 11/04/2014  . Chronic diastolic heart failure ([place]) 07/22/2014  . CKD (chronic kidney disease) stage 3, GFR 30-59 ml/min 06/11/2014  . Carotid artery disease ([place]) 04/08/2014  . Nevus of cheek 10/30/2013  . CAD (coronary artery disease)   . HLD (hyperlipidemia)   . Lung nodule 01/15/2013  . Memory loss 01/15/2013  . Weakness generalized 12/26/2012  . Incontinence 12/26/2012  . GERD (gastroesophageal reflux disease) 09/06/2012  . Neuropathy ([place]) 01/14/2011  . Insomnia 01/14/2011  . MELAS (mitochondrial encephalopathy, lactic acidosis and stroke-like episodes) ([place]) 11/02/2010  . HTN (hypertension)   . COPD (chronic obstructive pulmonary disease) ([place])   . Depression   . Thyroid disease     Orientation RESPIRATION BLADDER Height & Weight     Self  O2, Other (Comment) (non-rebreather mak, trying to  wean down. ) Incontinent Weight: 176 lb 5.9 oz (80 kg) Height:  5\' 5"  (165.1 cm)  BEHAVIORAL SYMPTOMS/MOOD NEUROLOGICAL BOWEL NUTRITION STATUS   (none)  (none ) Continent Diet (Diet: DYS 2 )  AMBULATORY STATUS COMMUNICATION OF NEEDS Skin   Extensive Assist Verbally Normal                       Personal Care Assistance Level of Assistance  Bathing, Feeding, Dressing Bathing Assistance: Limited assistance Feeding assistance: Independent Dressing Assistance: Limited assistance     Functional Limitations Info  Sight, Hearing, Speech Sight Info: Adequate Hearing Info: Adequate Speech Info: Adequate    SPECIAL CARE FACTORS FREQUENCY  PT (By licensed PT), OT (By licensed OT)     PT Frequency:  (5) OT Frequency:  (5)            Contractures      Additional Factors Info  Code Status, Allergies Code Status Info:  (Full Code. ) Allergies Info:  (Codeine, Prednisone, Shellfish Allergy)           Current Medications (04/05/2016):  This is the current hospital active medication list Current Facility-Administered Medications  Medication Dose Route Frequency Provider Last Rate Last Dose  . 0.9 %  sodium chloride infusion   Intravenous Continuous Theodoro Grist, MD 75 mL/hr at 04/05/16 0757    . acetaminophen (TYLENOL) tablet 650 mg  650 mg Oral Q6H PRN Theodoro Grist, MD       Or  . acetaminophen (TYLENOL) suppository 650 mg  650 mg Rectal Q6H PRN Theodoro Grist, MD      .  albuterol (PROVENTIL) (2.5 MG/3ML) 0.083% nebulizer solution 2.5 mg  2.5 mg Nebulization Q6H PRN Theodoro Grist, MD      . amLODipine (NORVASC) tablet 5 mg  5 mg Oral Daily Sital Mody, MD      . atorvastatin (LIPITOR) tablet 40 mg  40 mg Oral Daily Theodoro Grist, MD      . buPROPion (WELLBUTRIN XL) 24 hr tablet 150 mg  150 mg Oral Daily Sital Mody, MD      . clopidogrel (PLAVIX) tablet 75 mg  75 mg Oral Daily Theodoro Grist, MD      . famotidine (PEPCID) tablet 20 mg  20 mg Oral Daily Theodoro Grist, MD      .  heparin injection 5,000 Units  5,000 Units Subcutaneous Q8H Theodoro Grist, MD   5,000 Units at 04/05/16 1011  . levothyroxine (SYNTHROID, LEVOTHROID) tablet 25 mcg  25 mcg Oral QAC breakfast Theodoro Grist, MD      . memantine (NAMENDA) tablet 10 mg  10 mg Oral BID Sital Mody, MD      . mometasone-formoterol (DULERA) 200-5 MCG/ACT inhaler 2 puff  2 puff Inhalation BID Theodoro Grist, MD      . nitroGLYCERIN (NITROSTAT) SL tablet 0.4 mg  0.4 mg Sublingual Q5 min PRN Theodoro Grist, MD      . ondansetron (ZOFRAN) tablet 4 mg  4 mg Oral Q6H PRN Theodoro Grist, MD       Or  . ondansetron (ZOFRAN) injection 4 mg  4 mg Intravenous Q6H PRN Theodoro Grist, MD      . phenol (CHLORASEPTIC) mouth spray 1 spray  1 spray Mouth/Throat PRN Theodoro Grist, MD   1 spray at 04/05/16 0501  . sertraline (ZOLOFT) tablet 100 mg  100 mg Oral Daily Sital Mody, MD      . tiotropium (SPIRIVA) inhalation capsule 18 mcg  18 mcg Inhalation Daily Theodoro Grist, MD      . traZODone (DESYREL) tablet 25 mg  25 mg Oral QHS PRN Theodoro Grist, MD      . vitamin B-12 (CYANOCOBALAMIN) tablet 1,000 mcg  1,000 mcg Oral Daily Theodoro Grist, MD         Discharge Medications: Please see discharge summary for a list of discharge medications.  Relevant Imaging Results:  Relevant Lab Results:   Additional Information  (SSN: [id number])  Loletha Bertini, Veronia Beets, LCSW

## 2016-04-05 NOTE — Progress Notes (Signed)
[facility] NAME: Joy [name]    MR#:  [id number]  DATE OF BIRTH:  1930-03-17  SUBJECTIVE:   Patient was admitted with hypotension and confusion  This morning her oxygen saturation was found to be 82%. She is now on nonrebreather  REVIEW OF SYSTEMS:    Review of Systems  Unable to perform ROS: Dementia    Tolerating Diet: yes      DRUG ALLERGIES:   Allergies  Allergen Reactions  . Codeine Other (See Comments)    Coma for 2 days  . Prednisone Other (See Comments)    Weight gain,puffy face  . Shellfish Allergy     VITALS:  Blood pressure 140/72, pulse 81, temperature 99.2 F (37.3 C), temperature source Axillary, resp. rate 18, height 5\' 5"  (1.651 m), weight 80 kg (176 lb 5.9 oz), SpO2 93 %.  PHYSICAL EXAMINATION:   Physical Exam  Constitutional: She is well-developed, well-nourished, and in no distress. No distress.  HENT:  Head: Normocephalic.  Eyes: No scleral icterus.  Neck: Normal range of motion. Neck supple. No JVD present. No tracheal deviation present.  Cardiovascular: Normal rate, regular rhythm and normal heart sounds.  Exam reveals no gallop and no friction rub.   No murmur heard. Pulmonary/Chest: Effort normal. No respiratory distress. She has no wheezes. She has no rales. She exhibits no tenderness.  Mouth breather with nrb decreased throughout  Abdominal: Soft. Bowel sounds are normal. She exhibits no distension and no mass. There is no tenderness. There is no rebound and no guarding.  Musculoskeletal: Normal range of motion. She exhibits no edema.  Neurological: She is alert.  Skin: Skin is warm. No rash noted. No erythema.      LABORATORY PANEL:   CBC  Recent Labs Lab 04/05/16 0341  WBC 10.0  HGB 10.6*  HCT 32.3*  PLT 250   ------------------------------------------------------------------------------------------------------------------  Chemistries   Recent Labs Lab  04/04/16 1613 04/05/16 0341  NA 136 139  K 4.7 4.8  CL 101 107  CO2 25 21*  GLUCOSE 95 82  BUN 45* 40*  CREATININE 3.08* 2.08*  CALCIUM 8.3* 8.1*  AST 27  --   ALT 7*  --   ALKPHOS 95  --   BILITOT 0.8  --    ------------------------------------------------------------------------------------------------------------------  Cardiac Enzymes  Recent Labs Lab 04/04/16 1613  TROPONINI <0.03   ------------------------------------------------------------------------------------------------------------------  RADIOLOGY:  Dg Chest 1 View  Result Date: 04/04/2016 CLINICAL DATA:  Fall.  Trauma. EXAM: CHEST 1 VIEW COMPARISON:  06/26/2015 chest radiograph. FINDINGS: Stable cardiomediastinal silhouette with top-normal heart size and aortic atherosclerosis. No pneumothorax. No pleural effusion. No pulmonary edema. Patchy basilar predominant reticular opacities in both lungs appear stable. Emphysema. No acute consolidative airspace disease. No displaced fractures. IMPRESSION: 1. No acute pulmonary disease. 2. Stable patchy basilar predominant reticular opacities in both lungs, which may indicate an interstitial lung disease. 3. Aortic atherosclerosis. Electronically Signed   By: Ilona Sorrel M.D.   On: 04/04/2016 16:31   Ct Head Wo Contrast  Result Date: 04/04/2016 CLINICAL DATA:  Fall.  Head injury confusion EXAM: CT HEAD WITHOUT CONTRAST TECHNIQUE: Contiguous axial images were obtained from the base of the skull through the vertex without intravenous contrast. COMPARISON:  CT head 08/09/2015 FINDINGS: Brain: Cerebral atrophy unchanged. Mild chronic ischemic change in the white matter stable. Negative for acute infarct.  Negative for acute hemorrhage or mass. Vascular: Negative for hyperdense vessel Skull: Negative Sinuses/Orbits: Negative Other: None  IMPRESSION: No acute intracranial abnormality. Atrophy with mild chronic ischemic change in the white matter. Electronically Signed   By: Franchot Gallo M.D.   On: 04/04/2016 16:10     ASSESSMENT AND PLAN:    83 year old female with history of COPD and Alzheimer's dementia who presented with hypotension and found to have acute kidney injury now with acute hypoxic respiratory failure.  1. Acute hypoxic respiratory failure: Chest x-ray yesterday shows no acute pulmonary disease. We will reorder chest x-ray this a.m. due to worsening hypoxemia.. Evaluate for CHF. Wean nonrebreather as tolerated.   2. Hypotension in the setting of dehydration Blood pressures improved with IV fluids   3. Acute on chronic stage III kidney disease: Creatinine has improved with IV fluids  4. Essential hypertension: Start Norvasc in place of lisinopril due to problem #3  5. Acute metabolic encephalopathy thought to be due to gabapentin Discontinue gabapentin   6. Hypothyroidism: Continue Synthroid TSH was normal  7. COPD without exacerbation: Continue inhalers  8. Hyperlipidemia: Continue atorvastatin  9. Alzheimer's dementia: Continue to monitor Continue Namenda  10. Chronic diastolic heart failure: Follow-up on chest x-ray  CODE STATUS: FULL  TOTAL TIME TAKING CARE OF THIS PATIENT: 30 minutes.     POSSIBLE D/C 2 days, DEPENDING ON CLINICAL CONDITION.   Dallys Nowakowski M.D on 04/05/2016 at 7:44 AM  Between 7am to 6pm - Pager - [phone] After 6pm go to [email or web address] - password [email or web address] [employer] [facility]  Office  [phone]  CC: Primary care physician; Arnette Norris, MD  Note: This dictation was prepared with Dragon dictation along with smaller phrase technology. Any transcriptional errors that result from this process are unintentional.

## 2016-04-05 NOTE — Clinical Social Work Note (Signed)
Clinical Social Work Assessment  Patient Details  Name: Joy [name] MRN: [id number] Date of Birth: 1930/05/02  Date of referral:  04/05/16               Reason for consult:  Other (Comment Required) (From [facility] memory care )                Permission sought to share information with:  Chartered certified accountant granted to share information::  Yes, Verbal Permission Granted  Name::      [facility] memory care   Agency::     Relationship::     Contact Information:     Housing/Transportation Living arrangements for the past 2 months:  [place] of Information:  Patient, Adult Children, Spouse Patient Interpreter Needed:  None Criminal Activity/Legal Involvement Pertinent to Current Situation/Hospitalization:  No - Comment as needed Significant Relationships:  Adult Children, Spouse Lives with:  Facility Resident Do you feel safe going back to the place where you live?  Yes Need for family participation in patient care:  Yes (Comment)  Care giving concerns:  Patient is a long term care resident at [facility] memory care.    Social Worker assessment / plan:  Holiday representative ([place]) received verbal consult from Chief Strategy Officer that patient is from [facility]F. Per Seth Bake admissions coordinator at [facility] patient is from ALF memory care and is on oxygen at baseline. CSW met with patient and her daughter [name] [phone] and husband [name] were at bedside. Per husband he lives at [facility] independent living and patient lives in [place] memory care. Patient and daughter reported that they prefer for patient to return to memory care at [facility] however they would consider SNF at [facility] if needed. CSW will continue to follow and assist as needed.   Employment status:  Retired Forensic scientist:  Medicare PT Recommendations:  Not assessed at this time [place] / Referral to community resources:  [place], Other (Comment Required) (SNF VS. ALF )  Patient/Family's Response to care:  Patient prefers to return to ALF at [facility].   Patient/Family's Understanding of and Emotional Response to Diagnosis, Current Treatment, and Prognosis:  Patient and her family were very pleasant and thanked CSW for visit.   Emotional Assessment Appearance:  Appears stated age Attitude/Demeanor/Rapport:    Affect (typically observed):  Pleasant Orientation:  Oriented to Self, Oriented to Place, Fluctuating Orientation (Suspected and/or reported Sundowners) Alcohol / Substance use:  Not Applicable Psych involvement (Current and /or in the community):  No (Comment)  Discharge Needs  Concerns to be addressed:  Discharge Planning Concerns Readmission within the last 30 days:  No Current discharge risk:  Dependent with Mobility, Chronically ill Barriers to Discharge:  Continued Medical Work up   [employer], Veronia Beets, LCSW 04/05/2016, 5:00 PM

## 2016-04-06 ENCOUNTER — Inpatient Hospital Stay: Payer: Medicare Other

## 2016-04-06 DIAGNOSIS — N179 Acute kidney failure, unspecified: Principal | ICD-10-CM

## 2016-04-06 DIAGNOSIS — E860 Dehydration: Secondary | ICD-10-CM

## 2016-04-06 DIAGNOSIS — N183 Chronic kidney disease, stage 3 (moderate): Secondary | ICD-10-CM

## 2016-04-06 DIAGNOSIS — Z515 Encounter for palliative care: Secondary | ICD-10-CM

## 2016-04-06 DIAGNOSIS — F0280 Dementia in other diseases classified elsewhere without behavioral disturbance: Secondary | ICD-10-CM

## 2016-04-06 DIAGNOSIS — Z7189 Other specified counseling: Secondary | ICD-10-CM

## 2016-04-06 DIAGNOSIS — G309 Alzheimer's disease, unspecified: Secondary | ICD-10-CM

## 2016-04-06 DIAGNOSIS — Z66 Do not resuscitate: Secondary | ICD-10-CM

## 2016-04-06 LAB — BASIC METABOLIC PANEL
ANION GAP: 14 (ref 5–15)
BUN: 28 mg/dL — ABNORMAL HIGH (ref 6–20)
CHLORIDE: 110 mmol/L (ref 101–111)
CO2: 20 mmol/L — ABNORMAL LOW (ref 22–32)
Calcium: 8.5 mg/dL — ABNORMAL LOW (ref 8.9–10.3)
Creatinine, Ser: 1.24 mg/dL — ABNORMAL HIGH (ref 0.44–1.00)
GFR calc Af Amer: 45 mL/min — ABNORMAL LOW (ref >60)
GFR, EST NON AFRICAN AMERICAN: 39 mL/min — AB (ref >60)
Glucose, Bld: 78 mg/dL (ref 65–99)
POTASSIUM: 4.5 mmol/L (ref 3.5–5.1)
SODIUM: 144 mmol/L (ref 135–145)

## 2016-04-06 LAB — URINE CULTURE

## 2016-04-06 LAB — GLUCOSE, CAPILLARY: GLUCOSE-CAPILLARY: 82 mg/dL (ref 65–99)

## 2016-04-06 MED ORDER — AMLODIPINE BESYLATE 10 MG PO TABS
10.0000 mg | ORAL_TABLET | Freq: Every day | ORAL | Status: AC
  Administered 2016-04-06: 10 mg via ORAL

## 2016-04-06 MED ORDER — AMLODIPINE BESYLATE 5 MG PO TABS
5.0000 mg | ORAL_TABLET | Freq: Every day | ORAL | Status: DC
  Filled 2016-04-06: qty 1

## 2016-04-06 MED ORDER — HYDRALAZINE HCL 20 MG/ML IJ SOLN: 10.0000 mg | INTRAMUSCULAR | Status: DC | PRN

## 2016-04-06 MED ORDER — IPRATROPIUM-ALBUTEROL 0.5-2.5 (3) MG/3ML IN SOLN
3.0000 mL | RESPIRATORY_TRACT | Status: DC
  Administered 2016-04-06 – 2016-04-07 (×5): 3 mL via RESPIRATORY_TRACT
  Filled 2016-04-06 (×5): qty 3

## 2016-04-06 MED ORDER — AMLODIPINE BESYLATE 10 MG PO TABS
10.0000 mg | ORAL_TABLET | Freq: Every day | ORAL | Status: DC
  Administered 2016-04-07: 10 mg via ORAL
  Filled 2016-04-06: qty 1

## 2016-04-06 MED ORDER — IOPAMIDOL (ISOVUE-370) INJECTION 76%
75.0000 mL | Freq: Once | INTRAVENOUS | Status: AC | PRN
  Administered 2016-04-06: 60 mL via INTRAVENOUS

## 2016-04-06 MED ORDER — METHYLPREDNISOLONE SODIUM SUCC 40 MG IJ SOLR
40.0000 mg | Freq: Two times a day (BID) | INTRAMUSCULAR | Status: DC
  Administered 2016-04-06 (×2): 40 mg via INTRAVENOUS
  Filled 2016-04-06 (×3): qty 1

## 2016-04-06 NOTE — Evaluation (Signed)
Physical Therapy Evaluation Patient Details Name: Joy [name] MRN: [id number] DOB: 16-Sep-1930 Today's Date: 04/06/2016   History of Present Illness  83 y.o. female admtted to the hospital with acute on chronic renal failure after feeling light headed, falling, and striking the back of her head at [facility]F. Pt reported feeling weak and tired recently, recently being treated for UTI. PMH includes Alzheimer's disease, anxiety, depression, CAD, LAD stent placement, s/p cardiac cath, COPD, diastolic heart failure, HLD, HTN, thyroid disease, and TIA (2005).   Clinical Impression  Pt found sleeping in bed upon entry into room. Pt easy to arouse and agreeable to PT evaluation. Pt was A/O x3 and able to answer most of the questions about her prior level of function. Pt would fall back asleep intermittently during session, but was easy to arouse and continue with eval. Pt was modified independent with rolling, using bed rails to pull herself over to each side. O2 sats dropped to 88% on 4 L O2 briefly after second roll, but came back up to >90% after v/c's for pursed lip breathing and remained >90% for the remainder of the evaluation (RN notified). Pt required max assist of 2 to sit EOB and to lay back down. Pt was very fatigued and c/o feeling nauseous at end of eval (RN notified). Pt would benefit from skilled PT during admission to increase strength, balance, and activity tolerance. Recommend SNF at d/c d/t significantly decreased strength and activity tolerance and increased assistance for mobility compared to PLOF.    Follow Up Recommendations SNF    Equipment Recommendations  Rolling walker with 5" wheels    Recommendations for Other Services       Precautions / Restrictions Precautions Precautions: Fall Restrictions Weight Bearing Restrictions: No      Mobility  Bed Mobility Overal bed mobility: Modified Independent;Needs Assistance Bed Mobility: Rolling;Supine to Sit;Sit to  Supine Rolling: Modified independent (Device/Increase time)   Supine to sit: Max assist;+2 for physical assistance Sit to supine: Max assist;+2 for physical assistance   General bed mobility comments: Pt followed v/c's to roll to both sides in bed using bed rails to pull her self over to each side. Pt fatigued and lathargic during supine to/from sit; began to initiate moving legs toward the EOB with v/c's then requiring max assist of 2 for trunk and LE's.  Transfers                 General transfer comment: Trasfers not assessed at this time d/t pt being lethargic during evaluation.  Ambulation/Gait                Stairs            Wheelchair Mobility    Modified Rankin (Stroke Patients Only)       Balance Overall balance assessment: Needs assistance Sitting-balance support: Bilateral upper extremity supported;Feet supported Sitting balance-Leahy Scale: Poor Sitting balance - Comments: Pt required mod to max assist for trunk support while sitting EOB d/t lethargy/fatigue.                                     Pertinent Vitals/Pain Pain Assessment: No/denies pain    Home Living Family/patient expects to be discharged to:: Assisted living               Home Equipment: Kasandra Knudsen - single point;Walker - 2 wheels Additional Comments: Pt reports that she lives  at [facility] ALF with her husband; per SW notes pt lives at [facility] memory care and her husband lives at [facility] independent living.    Prior Function Level of Independence: Needs assistance   Gait / Transfers Assistance Needed: Pt reports she was independent with her cane for ambulation around her apartment and ALF prior to admission.   ADL's / Homemaking Assistance Needed: Pt reports that her husband and assisted living staff provide assistance with bathing, dressing, toileting, cooking and cleaning.        Hand Dominance        Extremity/Trunk Assessment   Upper  Extremity Assessment Upper Extremity Assessment: Generalized weakness    Lower Extremity Assessment Lower Extremity Assessment: Generalized weakness       Communication   Communication: No difficulties  Cognition Arousal/Alertness: Lethargic Behavior During Therapy: Flat affect Overall Cognitive Status: No family/caregiver present to determine baseline cognitive functioning (Pt orianted to self, place, and situation, patient from memory care, but unable to determine baseline w/o family present.)                                 General Comments: Pt was very larthargic during evaluation and had to be woken up multiple times during evaluation; repeated questions multiple times d/t lethargy and/or cognitive status.      General Comments      Exercises     Assessment/Plan    PT Assessment Patient needs continued PT services  PT Problem List Decreased strength;Decreased activity tolerance;Decreased balance;Decreased mobility;Decreased safety awareness;Cardiopulmonary status limiting activity       PT Treatment Interventions DME instruction;Gait training;Functional mobility training;Therapeutic activities;Therapeutic exercise;Balance training;Patient/family education    PT Goals (Current goals can be found in the Care Plan section)  Acute Rehab PT Goals Patient Stated Goal: To go back to [facility] ALF. PT Goal Formulation: With patient Time For Goal Achievement: 04/27/16 Potential to Achieve Goals: Fair    Frequency Min 2X/week   Barriers to discharge   Pt has very poor activity tolerance and is very deconditioned. Pt requiring increased assistence from reported prior level of function.    Co-evaluation               End of Session Equipment Utilized During Treatment: Oxygen (4 L O2 via nasal cannula.) Activity Tolerance: Patient limited by fatigue;Patient limited by lethargy Patient left: in bed;with call bell/phone within reach;with bed alarm  set Nurse Communication: Mobility status;Other (comment) (RN notified that pt reported feeling nauseous at end of session and that O2 sats dropped to 88% after rolling in bed, but quickly returned to >90 after cueing pt to perform pursed lip breathing.) PT Visit Diagnosis: Muscle weakness (generalized) (M62.81);History of falling (Z91.81)    Time: 9476-5465 PT Time Calculation (min) (ACUTE ONLY): 23 min   Charges:         PT G Codes:         Merri Dimaano, SPT 04/06/2016, 9:36 AM

## 2016-04-06 NOTE — Progress Notes (Signed)
New referral for Palliative services to follow at [facility] care received from [place]. No discharge date at this time. Referral made aware. Thank you. Flo Shanks RN, BSN, [facility] [place], hospital liaison [phone] c

## 2016-04-06 NOTE — Consult Note (Signed)
Consultation Note Date: 04/06/2016   Patient Name: Joy [name]  DOB: 1930-02-14  MRN: [id number]  Age / Sex: 83 y.o., female  PCP: Lucille Passy, MD Referring Physician: Bettey Costa, MD  Reason for Consultation: Establishing goals of care  HPI/Patient Profile: 83 y.o. female  with past medical history of Alzheimer's, COPD, TIA, diastolic dysfunction, CAD, HTN, HLD, thyroid disease, depression, anxiety, polymyalgia rheumatica, and chronic kidney disease stage III admitted on 04/04/2016 with low blood pressure, confusion, akathisia, and fall. Acute on chronic kidney disease improved from IVF. Acute on chronic hypoxic respiratory failure with baseline 2.5L home oxygen. Chest xray reveals small bilateral pleural effusions with no active disease. Acute metabolic encephalopathy possibly related to gabapentin which was discontinued on admit. Also baseline dementia. Palliative medicine consultation for goals of care.       Clinical Assessment and Goals of Care: I have reviewed medical records, discussed with Dr. Benjie Karvonen, and met with patient, daughter [name]) and husband at bedside to discuss diagnosis prognosis, GOC, EOL wishes, disposition and options. Daughter, [name] on speaker phone half way through the conversation.   Introduced Palliative Medicine as specialized medical care for people living with serious illness. It focuses on providing relief from the symptoms and stress of a serious illness. The goal is to improve quality of life for both the patient and the family.  We discussed a brief life review of the patient. Patient has been married to husband for over 51 years. Two living adult daughters that are very supportive. They had a son die in his 76's from MELAS. [name] speaks of "this all starting 18 years ago" when she was diagnosed with polymyalgia rheumatica and had steroid toxicity. She has had COPD and been on home  oxygen for almost 7 years. Also, dementia for 6-7 years and diagnosed with Alzheimer's this past year. She has been living at the memory care unit since October. Baseline, her mother is able to ambulate with walker, dress herself, and feed herself. She is able to recognize family.   Discussed current hospitalization, interventions, and multiple co-morbidities that are contributing to decline. Discussed natural trajectory of dementia. Daughters understand this is a chronic, progressive disease and she is likely at a new baseline. [name] states "my dad is in denial" and talks about the patient coming home to live with him.   Advanced directives, concepts specific to code status, artifical feeding and hydration, and rehospitalization were considered and discussed. I reviewed the living will document in chart with family. Patient was a FULL code. Educated on my recommendation for DNR/DNI with age, underlying co-morbidities, and poor quality of life if she survived. Educated on this being clearly documented in her living will that she would not want life prolonging measures if she had advanced alzheimer's/incurable disease. Family agrees with DNR/DNI and for her to die a natural death. Likely, would NOT want feeding tube to prolong her life. I educated that feeding tubes are not recommended for advanced alzheimer's, and can prolong life but not quality of life.  Spoke with daughters outside of the room. They asked if this is her new baseline. Explained my concern with progressing dementia, this will likely become a cycle of dehydration, rehospitalization, poor oral intake, infection with high risk for aspiration and overall deconditioning. Florentina Addison speaks of her mother being very stubborn and not willing to work with physical therapy. We talked about will power being a big factor in all this and not safe to force her to eat, drink, or work with physical therapy if she is refusing. Discussed normal signs/symptoms of  disease progression.   Hospice and Palliative Care services outpatient were explained and offered. At this time, family hopeful that they can encourage her to work with physical therapy at the assisted living facility. Daughters do seem realistic that she is at a new baseline after this hospitalization. Very agreeable with palliative to follow at facility. Discussed and educated on MOST form. Encouraged they complete in the future. They are also waiting on input from her neuro/psych MD who she will see next Friday.   Questions and concerns were addressed.  Hard Choices booklet left for review.    SUMMARY OF RECOMMENDATIONS    DNR/DNI. Durable DNR placed in chart.   Educated on MOST form and encouraged they consider completing.   Educated on natural trajectories of her chronic diseases.   Family agreeable with palliative to follow at SNF. Hopeful she will work with physical therapy at ALF.   PMT will continue to support patient and family through hospitalization.   Code Status/Advance Care Planning:  DNR   Symptom Management:   Per attending  Palliative Prophylaxis:   Aspiration, Delirium Protocol, Oral Care and Turn Reposition  Psycho-social/Spiritual:   Desire for further Chaplaincy support:no  Additional Recommendations: Caregiving  Support/Resources and Education on Hospice    Prognosis:   Unable to determine  Discharge Planning: To Be Determined-ALF with palliative services to follow      Primary Diagnoses: Present on Admission: . Acute on chronic renal failure (HCC) . Hypotension   I have reviewed the medical record, interviewed the patient and family, and examined the patient. The following aspects are pertinent.  Past Medical History:  Diagnosis Date  . Alzheimer disease   . Anxiety   . CAD (coronary artery disease)    h/o NSTEMI, LAD stent placement 10/04, cath 05/11/2010  . Carotid artery occlusion   . COPD (chronic obstructive pulmonary disease)  (HCC)   . Depression   . Diastolic dysfunction    Echo 05/11/2010, EF 65-70%  . HLD (hyperlipidemia)   . HTN (hypertension)   . Hypertension   . Kidney infection   . PMR (polymyalgia rheumatica) (HCC)    with h/o steroid induced myopathy  . Thyroid disease   . TIA (transient ischemic attack)    11/05 s/p L CEA   Social History   Social History  . Marital status: Married    Spouse name: N/A  . Number of children: N/A  . Years of education: N/A   Occupational History  . retired     [employer], broadcasting/film/video   Social History Main Topics  . Smoking status: Former Smoker    Packs/day: 1.00    Years: 25.00    Types: Cigarettes    Quit date: 01/05/1996  . Smokeless tobacco: Never Used  . Alcohol use No  . Drug use: No  . Sexual activity: No   Other Topics Concern  . None   Social History Narrative   Lives with husband, Jonny Ruiz at [place].   Daughters, [name] and [name] very involved.   History reviewed. No pertinent family history. Scheduled Meds: . [START ON 04/07/2016] amLODipine  10 mg Oral Daily  . atorvastatin  40 mg Oral Daily  . buPROPion  150 mg Oral Daily  . clopidogrel  75 mg Oral Daily  . famotidine  20 mg Oral Daily  . heparin subcutaneous  5,000 Units Subcutaneous Q8H  . ipratropium-albuterol  3 mL Nebulization Q4H  . levothyroxine  25 mcg Oral QAC breakfast  . memantine  10 mg Oral BID  . methylPREDNISolone (SOLU-MEDROL) injection  40 mg Intravenous Q12H  . mometasone-formoterol  2 puff Inhalation BID  . sertraline  100 mg Oral Daily  . tiotropium  18 mcg Inhalation Daily  . vitamin B-12  1,000 mcg Oral Daily   Continuous Infusions: . sodium chloride 75 mL/hr at 04/06/16 0841   PRN Meds:.acetaminophen **OR** acetaminophen, albuterol, nitroGLYCERIN, ondansetron **OR** ondansetron (ZOFRAN) IV, phenol, traZODone Medications Prior to Admission:  Prior to Admission medications   Medication Sig Start Date End Date Taking? Authorizing Provider  acetaminophen (TYLENOL) 650  MG CR tablet Take 650 mg by mouth every 8 (eight) hours as needed for pain.   Yes Historical Provider, MD  ADVAIR DISKUS 250-50 MCG/DOSE AEPB INHALE 1 PUFF BY MOUTH TWICE A DAY 09/29/15  Yes Lucille Passy, MD  albuterol (PROVENTIL HFA;VENTOLIN HFA) 108 (90 Base) MCG/ACT inhaler Inhale 2 puffs into the lungs every 6 (six) hours as needed for wheezing or shortness of breath. 04/17/15  Yes Joanne Gavel, MD  albuterol (PROVENTIL) (2.5 MG/3ML) 0.083% nebulizer solution Take 3 mLs (2.5 mg total) by nebulization every 6 (six) hours as needed for wheezing or shortness of breath. 04/28/15  Yes Lucille Passy, MD  amLODipine (NORVASC) 5 MG tablet Take 1 tablet (5 mg total) by mouth daily. 07/15/15  Yes Wellington Hampshire, MD  atorvastatin (LIPITOR) 40 MG tablet TAKE 1 TABLET BY MOUTH EVERY DAY Patient taking differently: TAKE 0.5  TABLET BY MOUTH EVERY DAY 08/14/15  Yes Lucille Passy, MD  buPROPion (WELLBUTRIN XL) 150 MG 24 hr tablet Take 150 mg by mouth daily.   Yes Historical Provider, MD  ciprofloxacin (CIPRO) 250 MG tablet Take 250 mg by mouth 2 (two) times daily. 04/01/16 04/06/16 Yes Historical Provider, MD  clopidogrel (PLAVIX) 75 MG tablet Take 1 tablet (75 mg total) by mouth daily. 05/22/15  Yes Lucille Passy, MD  gabapentin (NEURONTIN) 800 MG tablet TAKE 1 TABLET BY MOUTH TWICE A DAY 05/05/15  Yes Lucille Passy, MD  ipratropium-albuterol (DUONEB) 0.5-2.5 (3) MG/3ML SOLN Take 3 mLs by nebulization every 4 (four) hours as needed.   Yes Historical Provider, MD  levothyroxine (SYNTHROID, LEVOTHROID) 25 MCG tablet Take 25 mcg by mouth daily before breakfast.   Yes Historical Provider, MD  lisinopril (PRINIVIL,ZESTRIL) 10 MG tablet Take 1 tablet (10 mg total) by mouth daily. 07/15/15  Yes Wellington Hampshire, MD  loratadine (CLARITIN) 10 MG tablet Take 10 mg by mouth daily.   Yes Historical Provider, MD  memantine (NAMENDA) 10 MG tablet Take 10 mg by mouth 2 (two) times daily.   Yes Historical Provider, MD  nitroGLYCERIN  (NITROSTAT) 0.4 MG SL tablet Place 0.4 mg under the tongue every 5 (five) minutes as needed.     Yes Historical Provider, MD  ranitidine (ZANTAC) 300 MG tablet TAKE 1 TABLET (300 MG TOTAL) BY MOUTH AT BEDTIME. Patient taking differently: TAKE 0.5 TABLET (150 MG  TOTAL) BY MOUTH AT BEDTIME. 05/26/15  Yes Lucille Passy, MD  sertraline (ZOLOFT) 100 MG tablet TAKE 1 TABLET BY MOUTH EVERY DAY Patient taking differently: TAKE 1.5 TABLET BY MOUTH EVERY DAY 10/03/15  Yes Lucille Passy, MD  SPIRIVA HANDIHALER 18 MCG inhalation capsule PLACE 1 CAPSULE (18 MCG TOTAL) INTO INHALER AND INHALE DAILY. 06/03/15  Yes Lucille Passy, MD  traZODone (DESYREL) 50 MG tablet TAKE 1/2 TO 1 TABLET AT BEDTIME AS NEEDED FOR SLEEP 07/10/15  Yes Lucille Passy, MD  vitamin B-12 (CYANOCOBALAMIN) 1000 MCG tablet Take 1,000 mcg by mouth daily.   Yes Historical Provider, MD   Allergies  Allergen Reactions  . Codeine Other (See Comments)    Coma for 2 days  . Prednisone Other (See Comments)    Weight gain,puffy face  . Shellfish Allergy    Review of Systems  Unable to perform ROS: Dementia   Physical Exam  Constitutional: She is easily aroused. She appears ill.  HENT:  Head: Normocephalic and atraumatic.  Cardiovascular: Regular rhythm.   Pulmonary/Chest: Effort normal.  Abdominal: Normal appearance.  Neurological: She is alert and easily aroused. She is disoriented.  Skin: Skin is warm and dry. There is pallor.  Psychiatric: Her speech is delayed. Cognition and memory are impaired. She is inattentive.  Nursing note and vitals reviewed.  Vital Signs: BP (!) 164/59 (BP Location: Left Arm)   Pulse 100   Temp 98.1 F (36.7 C) (Oral)   Resp (!) 25   Ht '5\' 5"'$  (1.651 m)   Wt 79.9 kg (176 lb 3.2 oz)   SpO2 95%   BMI 29.32 kg/m  Pain Assessment: PAINAD   Pain Score: 9    SpO2: SpO2: 95 % O2 Device:SpO2: 95 % O2 Flow Rate: .O2 Flow Rate (L/min): 4 L/min  IO: Intake/output summary:   Intake/Output Summary (Last 24  hours) at 04/06/16 1501 Last data filed at 04/06/16 0442  Gross per 24 hour  Intake           1927.5 ml  Output                0 ml  Net           1927.5 ml    LBM: Last BM Date: 04/04/16 Baseline Weight: Weight: 93.4 kg (206 lb) Most recent weight: Weight: 79.9 kg (176 lb 3.2 oz)     Palliative Assessment/Data: PPS 30%   Flowsheet Rows     Most Recent Value  Intake Tab  Referral Department  Hospitalist  Unit at Time of Referral  Orthopedic Unit  Palliative Care Primary Diagnosis  Neurology  Palliative Care Type  New Palliative care  Reason for referral  Clarify Goals of Care  Clinical Assessment  Palliative Performance Scale Score  30%  Psychosocial & Spiritual Assessment  Palliative Care Outcomes  Patient/Family meeting held?  Yes  Who was at the meeting?  patient, husband, two daughters  Palliative Care Outcomes  Clarified goals of care, ACP counseling assistance, Changed CPR status, Completed durable DNR, Provided psychosocial or spiritual support, Linked to palliative care logitudinal support, Counseled regarding hospice      Time In: 1610   Time Out: 1510 Time Total: 115 min Greater than 50%  of this time was spent counseling and coordinating care related to the above assessment and plan.  Signed by:  Ihor Dow, FNP-C Palliative Medicine Team  Phone: [phone] Fax: [phone]   Please contact Palliative Medicine Team phone at [phone]  for questions and concerns.  For individual provider: See Shea Evans

## 2016-04-06 NOTE — Progress Notes (Signed)
No family at bedside. Patient baseline dementia. Discussed with Dr. Benjie Karvonen. Likely discharge to SNF today. Please have palliative services follow at facility to discuss [place] and advanced directives with family.   NO CHARGE  Ihor Dow, FNP-C Palliative Medicine Team  Phone: [phone] Fax: [phone]

## 2016-04-06 NOTE — Progress Notes (Addendum)
[facility] [place] NAME: Joy [name]    MR#:  [id number]  DATE OF BIRTH:  [birth year]  SUBJECTIVE:   Patient On 4 L of oxygen his morning. Oxygen level decreases with movement.  REVIEW OF SYSTEMS:    Review of Systems  Unable to perform ROS: Dementia    Tolerating Diet:  npo     DRUG ALLERGIES:   Allergies  Allergen Reactions  . Codeine Other (See Comments)    Coma for 2 days  . Prednisone Other (See Comments)    Weight gain,puffy face  . Shellfish Allergy     VITALS:  Blood pressure (!) 164/59, pulse 100, temperature 98.1 F (36.7 C), temperature source Oral, resp. rate (!) 25, height 5\' 5"  (1.651 m), weight 79.9 kg (176 lb 3.2 oz), SpO2 95 %.  PHYSICAL EXAMINATION:   Physical Exam  Constitutional: She is well-developed, well-nourished, and in no distress. No distress.  HENT:  Head: Normocephalic.  Eyes: No scleral icterus.  Neck: Normal range of motion. Neck supple. No JVD present. No tracheal deviation present.  Cardiovascular: Normal rate, regular rhythm and normal heart sounds.  Exam reveals no gallop and no friction rub.   No murmur heard. Pulmonary/Chest: Effort normal. No respiratory distress. She has wheezes. She has no rales. She exhibits no tenderness.  Abdominal: Soft. Bowel sounds are normal. She exhibits no distension and no mass. There is no tenderness. There is no rebound and no guarding.  Musculoskeletal: Normal range of motion. She exhibits no edema.  Neurological: She is alert.  Skin: Skin is warm. No rash noted. No erythema.      LABORATORY PANEL:   CBC  Recent Labs Lab 04/05/16 0341  WBC 10.0  HGB 10.6*  HCT 32.3*  PLT 250   ------------------------------------------------------------------------------------------------------------------  Chemistries   Recent Labs Lab 04/04/16 1613  04/06/16 0404  NA 136  < > 144  K 4.7  < > 4.5  CL 101  < > 110  CO2 25  < > 20*  GLUCOSE  95  < > 78  BUN 45*  < > 28*  CREATININE 3.08*  < > 1.24*  CALCIUM 8.3*  < > 8.5*  AST 27  --   --   ALT 7*  --   --   ALKPHOS 95  --   --   BILITOT 0.8  --   --   < > = values in this interval not displayed. ------------------------------------------------------------------------------------------------------------------  Cardiac Enzymes  Recent Labs Lab 04/04/16 1613  TROPONINI <0.03   ------------------------------------------------------------------------------------------------------------------  RADIOLOGY:  Dg Chest 1 View  Result Date: 04/05/2016 CLINICAL DATA:  Shortness of Breath EXAM: CHEST 1 VIEW COMPARISON:  April 04, 2016 and January 15, 2015 chest radiograph; chest CT August 08, 2015 FINDINGS: There is underlying interstitial fibrosis. There are small pleural effusions bilaterally. There is no frank edema or consolidation. Heart is mildly enlarged with pulmonary vascularity within normal limits. There is atherosclerotic calcification in the aorta. No adenopathy. No bone lesions. IMPRESSION: Underlying reticular interstitial disease. Stable cardiac prominence. There are small pleural effusions bilaterally. No frank edema or consolidation. There is aortic atherosclerosis. Electronically Signed   By: Lowella Grip III M.D.   On: 04/05/2016 08:19   Dg Chest 1 View  Result Date: 04/04/2016 CLINICAL DATA:  Fall.  Trauma. EXAM: CHEST 1 VIEW COMPARISON:  06/26/2015 chest radiograph. FINDINGS: Stable cardiomediastinal silhouette with top-normal heart size and aortic atherosclerosis. No pneumothorax. No pleural effusion.  No pulmonary edema. Patchy basilar predominant reticular opacities in both lungs appear stable. Emphysema. No acute consolidative airspace disease. No displaced fractures. IMPRESSION: 1. No acute pulmonary disease. 2. Stable patchy basilar predominant reticular opacities in both lungs, which may indicate an interstitial lung disease. 3. Aortic atherosclerosis.  Electronically Signed   By: Ilona Sorrel M.D.   On: 04/04/2016 16:31   Ct Head Wo Contrast  Result Date: 04/04/2016 CLINICAL DATA:  Fall.  Head injury confusion EXAM: CT HEAD WITHOUT CONTRAST TECHNIQUE: Contiguous axial images were obtained from the base of the skull through the vertex without intravenous contrast. COMPARISON:  CT head 08/09/2015 FINDINGS: Brain: Cerebral atrophy unchanged. Mild chronic ischemic change in the white matter stable. Negative for acute infarct.  Negative for acute hemorrhage or mass. Vascular: Negative for hyperdense vessel Skull: Negative Sinuses/Orbits: Negative Other: None IMPRESSION: No acute intracranial abnormality. Atrophy with mild chronic ischemic change in the white matter. Electronically Signed   By: Franchot Gallo M.D.   On: 04/04/2016 16:10     ASSESSMENT AND PLAN:    83 year old female with history of COPD and Alzheimer's dementia who presented with hypotension and found to have acute kidney injury now with acute hypoxic respiratory failure.  1. Acute on chronic hypoxic respiratory failure, baseline 2-3 liters: Chest x-ray showing small bilateral pleural effusions with no active disease Obtain CT to evaluate for pulmonary emboli Start IV steroids Wean oxygen as tolerated   2. Hypotension in the setting of dehydration Blood pressures have improved and now she is restarted on outpatient medications.  3. Acute on chronic stage III kidney disease: Creatinine has improved with IV fluids  4. Essential hypertension: Started Norvasc in place of lisinopril due to problem #3. Blood pressure still elevated so we'll increase Norvasc Potentially couldn't restart lisinopril in a.m. if creatinine remains stable and blood pressure elevated  5. Acute metabolic encephalopathy with chronic dementia thought to be due to gabapentin Discontinue gabapentin   6. Hypothyroidism: Continue Synthroid TSH was normal  7. COPD with chronic resp failure: Patient has  wheezing this morning Start IV steroids, nebs and continue inhalers  8. Hyperlipidemia: Continue atorvastatin   9. Alzheimer's dementia: Continue to monitor Continue Namenda  10. Chronic diastolic heart failure: Follow-up on chest x-ray  11. Nutrition: Patient currently nothing by mouth as she has marked oral pharyngeal dysphasia and severely compromised respiratory status as per speech consultation Speech re-eval today   Discussed case with positive care. Patient is currently full code. She is high risk of cardiopulmonary arrest. Given patient's acute hypoxic respiratory status and severe Alzheimer's dementia palliative care should continue to follow.   CODE STATUS: FULL  TOTAL TIME TAKING CARE OF THIS PATIENT: 28 minutes.     POSSIBLE D/C 2-4 days days, DEPENDING ON CLINICAL CONDITION.   Marc Leichter M.D on 04/06/2016 at 11:45 AM  Between 7am to 6pm - Pager - [phone] After 6pm go to [email or web address] - password [email or web address] [facility]  Office  [phone]  CC: Primary care physician; Arnette Norris, MD  Note: This dictation was prepared with Dragon dictation along with smaller phrase technology. Any transcriptional errors that result from this process are unintentional.

## 2016-04-06 NOTE — Progress Notes (Signed)
  Speech Language Pathology Treatment: Dysphagia  Patient Details Name: Joy [name] Joy [name] [name] MRN: [id number] DOB: Jul 30, 1930 Today's Date: 04/06/2016 Time: 1120-1205 SLP Time Calculation (min) (ACUTE ONLY): 45 min  Assessment / Plan / Recommendation Clinical Impression  Pt appears to be able to adequately tolerate trials of purees and Nectar consistency liquids VIA TSP; strict aspiration precautions in place when feeding pt. Pt is at increased risk for aspiration w/ oral intake at this time. Pt has significantly declined mental status - baseline Dementia. This could be an impact on her current presentation.    HPI HPI: Pt is a 83 y.o. female with a known history of Alzheimer's Dementia, long-standing tobacco abuse, CAD, COPD, Depression, TIA, diastolic dysfunction, and other medical issues who presents to the hospital with complaints of low blood pressure, confusion, akathisia , lower extremity movements. Apparently patient was felt to be dehydrated and very weak and was noted to be hypertensive in the facility. She was also more confused and very restless. She had some jerking movements in her upper and lower extremities,fiddling with sheets with her  fingers , she was brought to emergency room where she was noted to be in acute on chronic renal failure, creatinine level increased. Pt initially required increased O2 support but is now on [place] O2 support. Pt does have baseline Dementia which appears to be impacting her ability to maintain eyes open state to engage w/ others; minimally verbal but did nod and stated "uh-huh" when asked basic questions. She opened mouth to stim of spoon put to lips.       SLP Plan  Continue with current plan of care;Goals updated       Recommendations  Diet recommendations: Dysphagia 1 (puree);Nectar-thick liquid Liquids provided via: Teaspoon;No straw Medication Administration: Crushed with puree Supervision: Staff to assist with self feeding;Full supervision/cueing  for compensatory strategies Compensations: Minimize environmental distractions;Slow rate;Small sips/bites;Multiple dry swallows after each bite/sip;Follow solids with liquid (time b/t bites) Postural Changes and/or Swallow Maneuvers: Seated upright 90 degrees;Upright 30-60 min after meal                General recommendations:  (Dietician consult) Oral Care Recommendations: Oral care BID;Staff/trained caregiver to provide oral care Follow up Recommendations: Skilled Nursing facility SLP Visit Diagnosis: Dysphagia, oropharyngeal phase (R13.12) Plan: Continue with current plan of care;Goals updated       [place], [place], CCC-SLP Joy [name] [name],Joy [name] 04/06/2016, 1:56 PM

## 2016-04-06 NOTE — Progress Notes (Signed)
Per MD patient is not stable for D/C today. Clinical Education officer, museum (CSW) contacted Seth Bake at [facility] and Raquel Sarna at [facility] memory care and made them aware of above. CSW made Seth Bake and Raquel Sarna aware that PT is recommending SNF. Per Raquel Sarna that is patient's baseline and she can return to [facility] memory care.   [employer], LCSW [phone]

## 2016-04-07 LAB — GLUCOSE, CAPILLARY: Glucose-Capillary: 115 mg/dL — ABNORMAL HIGH (ref 65–99)

## 2016-04-07 MED ORDER — AMOXICILLIN-POT CLAVULANATE 875-125 MG PO TABS: 1.0000 | ORAL_TABLET | Freq: Two times a day (BID) | ORAL | 0 refills | Status: DC

## 2016-04-07 MED ORDER — FLUCONAZOLE 100 MG PO TABS: 100.0000 mg | ORAL_TABLET | Freq: Every day | ORAL | 0 refills | Status: DC

## 2016-04-07 MED ORDER — FIRST-DUKES MOUTHWASH MT SUSP: 5.0000 mL | Freq: Three times a day (TID) | OROMUCOSAL | 0 refills | Status: DC

## 2016-04-07 MED ORDER — PREDNISONE 10 MG (21) PO TBPK: ORAL_TABLET | ORAL | 0 refills | Status: DC

## 2016-04-07 MED ORDER — PHENOL 1.4 % MT LIQD: 1.0000 | Freq: Four times a day (QID) | OROMUCOSAL | 0 refills | Status: DC

## 2016-04-07 MED ORDER — PHENOL 1.4 % MT LIQD: 1.0000 | OROMUCOSAL | 0 refills | Status: DC | PRN

## 2016-04-07 NOTE — Discharge Instructions (Signed)

## 2016-04-07 NOTE — Progress Notes (Addendum)
Patient is medically stable for D/C back to [facility] memory care today. Per Building control surveyor at [facility] memory care patient can come today via EMS. RN will call report to Manuela Schwartz RN at [phone] and arrange EMS for transport. Clinical Education officer, museum (CSW) sent D/C orders to [place]. CSW contacted patient's daughter [name] and made her aware of above. Daughter stated that she would like patient's throat checked before she leaves [facility]. CSW made RN aware of above. Please reconsult if future social work needs arise. CSW signing off.   CSW contacted patient's daughter [name] to make her aware EMS is at [facility] to transport patient. Katie did not answer and a voicemail was left.    Katie called CSW back and is aware EMS has picked patient up.   [employer], LCSW [phone]

## 2016-04-07 NOTE — Progress Notes (Signed)
RN called report to RN at [facility]. All questions answered. Prescriptions and AVS in transport packet. Patient aware of transport. Not complaining of any pain or difficulty breathing at this time. EMS called for transport. Patient meds placed in transport bag.   Joy [name]

## 2016-04-07 NOTE — NC FL2 (Signed)
[place] LEVEL OF CARE SCREENING TOOL     IDENTIFICATION  Patient Name: Joy [name] Birthdate: 10-23-1930 Sex: female Admission Date (Current Location): 04/04/2016  [place] and [place] Number:  Engineering geologist and Address:  [facility], [address] [place], [place] [address]      Provider Number: [id number]  Attending Physician Name and Address:  Max Sane, MD  Relative Name and Phone Number:       Current Level of Care: Hospital Recommended Level of Care: Assisted Living  Memory Care  Prior Approval Number:    Date Approved/Denied:   PASRR Number:  [id number])  Discharge Plan: Assisted Living Memory Care     Current Diagnoses: Primary Dementia  Patient Active Problem List   Diagnosis Date Noted  . Alzheimer's dementia without behavioral disturbance   . Dehydration   . Palliative care by specialist   . DNR (do not resuscitate)   . Goals of care, counseling/discussion   . Gabapentin-induced toxicity 04/04/2016  . Acute on chronic renal failure ([place]) 10/13/2015  . Lower urinary tract infectious disease 10/04/2015  . Altered mental state 10/04/2015  . Hypotension 05/05/2015  . Pedal edema 05/05/2015  . COPD exacerbation ([place]) 04/25/2015  . Left lower lobe pneumonia ([place]) 04/25/2015  . Chest pain 02/13/2015  . Allergic rhinitis 11/04/2014  . Chronic diastolic heart failure ([place]) 07/22/2014  . CKD (chronic kidney disease) stage 3, GFR 30-59 ml/min 06/11/2014  . Carotid artery disease ([place]) 04/08/2014  . Nevus of cheek 10/30/2013  . CAD (coronary artery disease)   . HLD (hyperlipidemia)   . Lung nodule 01/15/2013  . Memory loss 01/15/2013  . Weakness generalized 12/26/2012  . Incontinence 12/26/2012  . GERD (gastroesophageal reflux disease) 09/06/2012  . Neuropathy ([place]) 01/14/2011  . Insomnia 01/14/2011  . MELAS (mitochondrial encephalopathy, lactic acidosis and stroke-like episodes) ([place]) 11/02/2010  .  HTN (hypertension)   . COPD (chronic obstructive pulmonary disease) ([place])   . Depression   . Thyroid disease     Orientation RESPIRATION BLADDER Height & Weight     Self  4 Liters Oxygen  Incontinent Weight: 176 lb 6.4 oz (80 kg) Height:  5\' 5"  (165.1 cm)  BEHAVIORAL SYMPTOMS/MOOD NEUROLOGICAL BOWEL NUTRITION STATUS   (none)  (none ) Continent Dysphagia 1 (puree);Nectar-thick liquid  AMBULATORY STATUS COMMUNICATION OF NEEDS Skin   Extensive Assist Verbally Normal                       Personal Care Assistance Level of Assistance  Bathing, Feeding, Dressing Bathing Assistance: Limited assistance Feeding assistance: Independent Dressing Assistance: Limited assistance     Functional Limitations Info  Sight, Hearing, Speech Sight Info: Adequate Hearing Info: Adequate Speech Info: Adequate    SPECIAL CARE FACTORS FREQUENCY  Palliative Care to follow                   Contractures      Additional Factors Info  Code Status, Allergies Code Status Info: DNR  Allergies Info:  (Codeine, Prednisone, Shellfish Allergy)          Discharge Medications: Please see discharge summary for a list of discharge medications. Medication List    STOP taking these medications   ciprofloxacin 250 MG tablet Commonly known as:  CIPRO   gabapentin 800 MG tablet Commonly known as:  NEURONTIN     TAKE these medications   acetaminophen 650 MG CR tablet Commonly known as:  TYLENOL Take  650 mg by mouth every 8 (eight) hours as needed for pain.   ADVAIR DISKUS 250-50 MCG/DOSE Aepb Generic drug:  Fluticasone-Salmeterol INHALE 1 PUFF BY MOUTH TWICE A DAY   albuterol 108 (90 Base) MCG/ACT inhaler Commonly known as:  PROVENTIL HFA;VENTOLIN HFA Inhale 2 puffs into the lungs every 6 (six) hours as needed for wheezing or shortness of breath.   albuterol (2.5 MG/3ML) 0.083% nebulizer solution Commonly known as:  PROVENTIL Take 3 mLs (2.5 mg total) by nebulization every 6  (six) hours as needed for wheezing or shortness of breath.   amLODipine 5 MG tablet Commonly known as:  NORVASC Take 1 tablet (5 mg total) by mouth daily.   amoxicillin-clavulanate 875-125 MG tablet Commonly known as:  AUGMENTIN Take 1 tablet by mouth 2 (two) times daily.   atorvastatin 40 MG tablet Commonly known as:  LIPITOR TAKE 1 TABLET BY MOUTH EVERY DAY What changed:  See the new instructions.   buPROPion 150 MG 24 hr tablet Commonly known as:  WELLBUTRIN XL Take 150 mg by mouth daily.   clopidogrel 75 MG tablet Commonly known as:  PLAVIX Take 1 tablet (75 mg total) by mouth daily.   ipratropium-albuterol 0.5-2.5 (3) MG/3ML Soln Commonly known as:  DUONEB Take 3 mLs by nebulization every 4 (four) hours as needed.   levothyroxine 25 MCG tablet Commonly known as:  SYNTHROID, LEVOTHROID Take 25 mcg by mouth daily before breakfast.   lisinopril 10 MG tablet Commonly known as:  PRINIVIL,ZESTRIL Take 1 tablet (10 mg total) by mouth daily.   loratadine 10 MG tablet Commonly known as:  CLARITIN Take 10 mg by mouth daily.   memantine 10 MG tablet Commonly known as:  NAMENDA Take 10 mg by mouth 2 (two) times daily.   nitroGLYCERIN 0.4 MG SL tablet Commonly known as:  NITROSTAT Place 0.4 mg under the tongue every 5 (five) minutes as needed.   phenol 1.4 % Liqd Commonly known as:  CHLORASEPTIC Use as directed 1 spray in the mouth or throat as needed for throat irritation / pain.   predniSONE 10 MG (21) Tbpk tablet Commonly known as:  STERAPRED UNI-PAK 21 TAB Start 60 mg po daily, taper 10 mg daily until done   ranitidine 300 MG tablet Commonly known as:  ZANTAC TAKE 1 TABLET (300 MG TOTAL) BY MOUTH AT BEDTIME. What changed:  See the new instructions.   sertraline 100 MG tablet Commonly known as:  ZOLOFT TAKE 1 TABLET BY MOUTH EVERY DAY What changed:  See the new instructions.   SPIRIVA HANDIHALER 18 MCG inhalation capsule Generic drug:   tiotropium PLACE 1 CAPSULE (18 MCG TOTAL) INTO INHALER AND INHALE DAILY.   traZODone 50 MG tablet Commonly known as:  DESYREL TAKE 1/2 TO 1 TABLET AT BEDTIME AS NEEDED FOR SLEEP   vitamin B-12 1000 MCG tablet Commonly known as:  CYANOCOBALAMIN Take 1,000 mcg by mouth daily.  Relevant Imaging Results: Relevant Lab Results: Additional Information  (SSN: [id number])  Chareese Sergent, Veronia Beets, LCSW

## 2016-04-07 NOTE — Discharge Summary (Signed)
[facility] [place] NAME: Joy [name]    MR#:  [id number]  DATE OF BIRTH:  [birth year]  DATE OF ADMISSION:  04/04/2016   ADMITTING PHYSICIAN: Theodoro Grist, MD  DATE OF DISCHARGE: 04/07/2016  PRIMARY CARE PHYSICIAN: Arnette Norris, MD   ADMISSION DIAGNOSIS:  Acute kidney injury ([place]) [N17.9] DISCHARGE DIAGNOSIS:  Active Problems:   Hypotension   Acute on chronic renal failure (HCC)   Gabapentin-induced toxicity   Alzheimer's dementia without behavioral disturbance   Dehydration   Palliative care by specialist   DNR (do not resuscitate)   Goals of care, counseling/discussion  SECONDARY DIAGNOSIS:   Past Medical History:  Diagnosis Date  . Alzheimer disease   . Anxiety   . CAD (coronary artery disease)    h/o NSTEMI, LAD stent placement 10/04, cath 05/11/2010  . Carotid artery occlusion   . COPD (chronic obstructive pulmonary disease) ([place])   . Depression   . Diastolic dysfunction    Echo 05/11/2010, EF 65-70%  . HLD (hyperlipidemia)   . HTN (hypertension)   . Hypertension   . Kidney infection   . PMR (polymyalgia rheumatica) (HCC)    with h/o steroid induced myopathy  . Thyroid disease   . TIA (transient ischemic attack)    11/05 s/p L CEA   HOSPITAL COURSE:  83 year old female with history of COPD and Alzheimer's dementia who presented with hypotension and found to have acute kidney injury now with acute hypoxic respiratory failure.  1. Acute on chronic hypoxic respiratory failure, baseline 2.5 liters: CT chest showed Small dependent bilateral pleural effusions, possible B/L pneumonia, Severe emphysema with diffuse bronchial wall thickening and Aortic atherosclerosis. Three-vessel coronary atherosclerosis.  2. Hypotension in the setting of dehydration Blood pressures have improved and now she is restarted on outpatient medications.  3. Acute on chronic stage III kidney disease: Creatinine has improved with IV fluids  and close to baseline  4. Essential hypertension:stable  5. Acute metabolic encephalopathy with chronic dementia thought to be due to gabapentin Discontinue gabapentin  6. Hypothyroidism: Continue Synthroid TSH was normal  7. COPD with chronic resp failure: discharged on steroid taper  8. Hyperlipidemia: Continue atorvastatin  9. Alzheimer's dementia: Continue Namenda  10. Chronic diastolic heart failure: stable  11. Nutrition: ST recommends  Diet recommendations: Dysphagia 1 (puree);Nectar-thick liquid Liquids provided via: Teaspoon;No straw Medication Administration: Crushed with puree Supervision: Staff to assist with self feeding;Full supervision/cueing for compensatory strategies Compensations: Minimize environmental distractions;Slow rate;Small sips/bites;Multiple dry swallows after each bite/sip;Follow solids with liquid (time b/t bites) Postural Changes and/or Swallow Maneuvers: Seated upright 90 degrees;Upright 30-60 min after meal  DISCHARGE CONDITIONS:  fair CONSULTS OBTAINED:  Treatment Team:  Allyne Gee, MD DRUG ALLERGIES:   Allergies  Allergen Reactions  . Codeine Other (See Comments)    Coma for 2 days  . Prednisone Other (See Comments)    Weight gain,puffy face  . Shellfish Allergy    DISCHARGE MEDICATIONS:   Allergies as of 04/07/2016      Reactions   Codeine Other (See Comments)   Coma for 2 days   Prednisone Other (See Comments)   Weight gain,puffy face   Shellfish Allergy       Medication List    STOP taking these medications   ciprofloxacin 250 MG tablet Commonly known as:  CIPRO   gabapentin 800 MG tablet Commonly known as:  NEURONTIN     TAKE these medications   acetaminophen 650 MG CR  tablet Commonly known as:  TYLENOL Take 650 mg by mouth every 8 (eight) hours as needed for pain.   ADVAIR DISKUS 250-50 MCG/DOSE Aepb Generic drug:  Fluticasone-Salmeterol INHALE 1 PUFF BY MOUTH TWICE A DAY   albuterol 108 (90  Base) MCG/ACT inhaler Commonly known as:  PROVENTIL HFA;VENTOLIN HFA Inhale 2 puffs into the lungs every 6 (six) hours as needed for wheezing or shortness of breath.   albuterol (2.5 MG/3ML) 0.083% nebulizer solution Commonly known as:  PROVENTIL Take 3 mLs (2.5 mg total) by nebulization every 6 (six) hours as needed for wheezing or shortness of breath.   amLODipine 5 MG tablet Commonly known as:  NORVASC Take 1 tablet (5 mg total) by mouth daily.   amoxicillin-clavulanate 875-125 MG tablet Commonly known as:  AUGMENTIN Take 1 tablet by mouth 2 (two) times daily.   atorvastatin 40 MG tablet Commonly known as:  LIPITOR TAKE 1 TABLET BY MOUTH EVERY DAY What changed:  See the new instructions.   buPROPion 150 MG 24 hr tablet Commonly known as:  WELLBUTRIN XL Take 150 mg by mouth daily.   clopidogrel 75 MG tablet Commonly known as:  PLAVIX Take 1 tablet (75 mg total) by mouth daily.   ipratropium-albuterol 0.5-2.5 (3) MG/3ML Soln Commonly known as:  DUONEB Take 3 mLs by nebulization every 4 (four) hours as needed.   levothyroxine 25 MCG tablet Commonly known as:  SYNTHROID, LEVOTHROID Take 25 mcg by mouth daily before breakfast.   lisinopril 10 MG tablet Commonly known as:  PRINIVIL,ZESTRIL Take 1 tablet (10 mg total) by mouth daily.   loratadine 10 MG tablet Commonly known as:  CLARITIN Take 10 mg by mouth daily.   memantine 10 MG tablet Commonly known as:  NAMENDA Take 10 mg by mouth 2 (two) times daily.   nitroGLYCERIN 0.4 MG SL tablet Commonly known as:  NITROSTAT Place 0.4 mg under the tongue every 5 (five) minutes as needed.   phenol 1.4 % Liqd Commonly known as:  CHLORASEPTIC Use as directed 1 spray in the mouth or throat as needed for throat irritation / pain.   predniSONE 10 MG (21) Tbpk tablet Commonly known as:  STERAPRED UNI-PAK 21 TAB Start 60 mg po daily, taper 10 mg daily until done   ranitidine 300 MG tablet Commonly known as:  ZANTAC TAKE 1  TABLET (300 MG TOTAL) BY MOUTH AT BEDTIME. What changed:  See the new instructions.   sertraline 100 MG tablet Commonly known as:  ZOLOFT TAKE 1 TABLET BY MOUTH EVERY DAY What changed:  See the new instructions.   SPIRIVA HANDIHALER 18 MCG inhalation capsule Generic drug:  tiotropium PLACE 1 CAPSULE (18 MCG TOTAL) INTO INHALER AND INHALE DAILY.   traZODone 50 MG tablet Commonly known as:  DESYREL TAKE 1/2 TO 1 TABLET AT BEDTIME AS NEEDED FOR SLEEP   vitamin B-12 1000 MCG tablet Commonly known as:  CYANOCOBALAMIN Take 1,000 mcg by mouth daily.      DISCHARGE INSTRUCTIONS:   DIET:  Dysphagia 1 (puree);Nectar-thick liquid Liquids provided via: Teaspoon;No straw Medication Administration: Crushed with puree Supervision: Staff to assist with self feeding;Full supervision/cueing for compensatory strategies Compensations: Minimize environmental distractions;Slow rate;Small sips/bites;Multiple dry swallows after each bite/sip;Follow solids with liquid (time b/t bites) Postural Changes and/or Swallow Maneuvers: Seated upright 90 degrees;Upright 30-60 min after meal DISCHARGE CONDITION:  Good ACTIVITY:  Activity as tolerated OXYGEN:  Home Oxygen: Yes.    Oxygen Delivery: 4 liters/min via Patient connected to nasal cannula oxygen  DISCHARGE LOCATION:  [facility] care unit - with Palliative care    If you experience worsening of your admission symptoms, develop shortness of breath, life threatening emergency, suicidal or homicidal thoughts you must seek medical attention immediately by calling 911 or calling your MD immediately  if symptoms less severe.  You Must read complete instructions/literature along with all the possible adverse reactions/side effects for all the Medicines you take and that have been prescribed to you. Take any new Medicines after you have completely understood and accpet all the possible adverse reactions/side effects.   Please note  You were cared  for by a hospitalist during your hospital stay. If you have any questions about your discharge medications or the care you received while you were in the hospital after you are discharged, you can call the unit and asked to speak with the hospitalist on call if the hospitalist that took care of you is not available. Once you are discharged, your primary care physician will handle any further medical issues. Please note that NO REFILLS for any discharge medications will be authorized once you are discharged, as it is imperative that you return to your primary care physician (or establish a relationship with a primary care physician if you do not have one) for your aftercare needs so that they can reassess your need for medications and monitor your lab values.    On the day of Discharge:  VITAL SIGNS:  Blood pressure (!) 154/57, pulse 99, temperature 98.1 F (36.7 C), temperature source Oral, resp. rate 18, height 5\' 5"  (1.651 m), weight 80 kg (176 lb 6.4 oz), SpO2 92 %. PHYSICAL EXAMINATION:  GENERAL:  83 y.o.-year-old patient lying in the bed with no acute distress.  EYES: Pupils equal, round, reactive to light and accommodation. No scleral icterus. Extraocular muscles intact.  HEENT: Head atraumatic, normocephalic. Oropharynx and nasopharynx clear.  NECK:  Supple, no jugular venous distention. No thyroid enlargement, no tenderness.  LUNGS: Normal breath sounds bilaterally, no wheezing, rales,rhonchi or crepitation. No use of accessory muscles of respiration.  CARDIOVASCULAR: S1, S2 normal. No murmurs, rubs, or gallops.  ABDOMEN: Soft, non-tender, non-distended. Bowel sounds present. No organomegaly or mass.  EXTREMITIES: No pedal edema, cyanosis, or clubbing.  NEUROLOGIC: Cranial nerves II through XII are intact. Muscle strength 5/5 in all extremities. Sensation intact. Gait not checked.  PSYCHIATRIC: The patient is alert and oriented x 3.  SKIN: No obvious rash, lesion, or ulcer.  DATA REVIEW:     CBC  Recent Labs Lab 04/05/16 0341  WBC 10.0  HGB 10.6*  HCT 32.3*  PLT 250    Chemistries   Recent Labs Lab 04/04/16 1613  04/06/16 0404  NA 136  < > 144  K 4.7  < > 4.5  CL 101  < > 110  CO2 25  < > 20*  GLUCOSE 95  < > 78  BUN 45*  < > 28*  CREATININE 3.08*  < > 1.24*  CALCIUM 8.3*  < > 8.5*  AST 27  --   --   ALT 7*  --   --   ALKPHOS 95  --   --   BILITOT 0.8  --   --   < > = values in this interval not displayed.    Contact information for follow-up providers    Arnette Norris, MD. Schedule an appointment as soon as possible for a visit in 1 week(s).   Specialty:  Family Medicine Contact information: [phone] [address] [phone]            Contact information for after-discharge care    Destination    [facility] Follow up.   Specialty:  Skilled Nursing Facility Contact information: [place] [phone]                   RADIOLOGY:  Ct Angio Chest Pe W Or Wo Contrast  Result Date: 04/06/2016 CLINICAL DATA:  Hypoxia.  Inpatient. EXAM: CT ANGIOGRAPHY CHEST WITH CONTRAST TECHNIQUE: Multidetector CT imaging of the chest was performed using the standard protocol during bolus administration of intravenous contrast. Multiplanar CT image reconstructions and MIPs were obtained to evaluate the vascular anatomy. CONTRAST:  60 cc Isovue 370 IV. COMPARISON:  Chest radiograph from earlier today. 08/08/2015 chest CT. FINDINGS: Cardiovascular: The study is low to moderate quality for the evaluation of pulmonary embolism, with evaluation of the segmental and subsegmental pulmonary arteries largely precluded due to motion artifact. There are no filling defects in the central or lobar pulmonary artery branches to suggest acute pulmonary embolism. Atherosclerotic nonaneurysmal thoracic aorta. Normal caliber pulmonary arteries. Normal heart size. No significant pericardial fluid/thickening.  Left anterior descending, left circumflex and right coronary atherosclerosis. Mediastinum/Nodes: No discrete thyroid nodules. Unremarkable esophagus. No pathologically enlarged axillary, mediastinal or hilar lymph nodes. Lungs/Pleura: No pneumothorax. Small dependent bilateral pleural effusions. Severe centrilobular emphysema and diffuse bronchial wall thickening. Consolidation and volume loss in the dependent lower lobes bilaterally. Small bandlike opacity in the peripheral right middle lobe, stable, compatible with postinfectious/postinflammatory scarring. No lung masses or significant pulmonary nodules in the aerated portions of the lungs. Upper abdomen: Unremarkable. Musculoskeletal: No aggressive appearing focal osseous lesions. Mild compression fractures in the T7 and T9 vertebral bodies are stable since 08/08/2015. Moderate thoracic spondylosis. Review of the MIP images confirms the above findings. IMPRESSION: 1. Limited motion degraded scan. No central or lobar pulmonary embolism. 2. Small dependent bilateral pleural effusions. 3. Consolidation and volume loss in the dependent bilateral lower lobes, favor atelectasis, difficult to exclude a component of pneumonia or aspiration. 4. Severe emphysema with diffuse bronchial wall thickening, suggesting COPD. 5. Aortic atherosclerosis.  Three-vessel coronary atherosclerosis. Electronically Signed   By: Ilona Sorrel M.D.   On: 04/06/2016 13:27    I have discussed discharge plan with her daughter [name] [phone]) and she is in agreement.   Management plans discussed with the patient, family and they are in agreement.  CODE STATUS: DNR   TOTAL TIME TAKING CARE OF THIS PATIENT: 45 minutes.    Max Sane M.D on 04/07/2016 at 8:22 AM  Between 7am to 6pm - Pager - [phone]  After 6pm go to [email or web address] - Proofreader  [facility]  Office  [phone]  CC: Primary care physician; Arnette Norris, MD   Note: This  dictation was prepared with Dragon dictation along with smaller phrase technology. Any transcriptional errors that result from this process are unintentional.

## 2016-04-08 DIAGNOSIS — R1312 Dysphagia, oropharyngeal phase: Secondary | ICD-10-CM | POA: Diagnosis not present

## 2016-04-08 DIAGNOSIS — N390 Urinary tract infection, site not specified: Secondary | ICD-10-CM | POA: Diagnosis not present

## 2016-04-08 DIAGNOSIS — J449 Chronic obstructive pulmonary disease, unspecified: Secondary | ICD-10-CM | POA: Diagnosis not present

## 2016-04-08 DIAGNOSIS — I10 Essential (primary) hypertension: Secondary | ICD-10-CM | POA: Diagnosis not present

## 2016-04-08 DIAGNOSIS — R410 Disorientation, unspecified: Secondary | ICD-10-CM | POA: Diagnosis not present

## 2016-04-13 ENCOUNTER — Encounter: Payer: Self-pay | Admitting: Family Medicine

## 2016-04-13 ENCOUNTER — Ambulatory Visit (INDEPENDENT_AMBULATORY_CARE_PROVIDER_SITE_OTHER): Payer: Medicare Other | Admitting: Family Medicine

## 2016-04-13 VITALS — BP 136/82 | HR 74 | Wt 162.3 lb

## 2016-04-13 DIAGNOSIS — R634 Abnormal weight loss: Secondary | ICD-10-CM

## 2016-04-13 DIAGNOSIS — N179 Acute kidney failure, unspecified: Secondary | ICD-10-CM

## 2016-04-13 DIAGNOSIS — N183 Chronic kidney disease, stage 3 (moderate): Secondary | ICD-10-CM | POA: Diagnosis not present

## 2016-04-13 DIAGNOSIS — F0280 Dementia in other diseases classified elsewhere without behavioral disturbance: Secondary | ICD-10-CM

## 2016-04-13 DIAGNOSIS — G301 Alzheimer's disease with late onset: Secondary | ICD-10-CM | POA: Diagnosis not present

## 2016-04-13 DIAGNOSIS — Z66 Do not resuscitate: Secondary | ICD-10-CM

## 2016-04-13 DIAGNOSIS — J440 Chronic obstructive pulmonary disease with acute lower respiratory infection: Secondary | ICD-10-CM | POA: Diagnosis not present

## 2016-04-13 DIAGNOSIS — R4182 Altered mental status, unspecified: Secondary | ICD-10-CM

## 2016-04-13 MED ORDER — DRONABINOL 2.5 MG PO CAPS: 2.5000 mg | ORAL_CAPSULE | Freq: Two times a day (BID) | ORAL | 1 refills | Status: DC

## 2016-04-13 MED ORDER — ONDANSETRON HCL 4 MG PO TABS: 4.0000 mg | ORAL_TABLET | Freq: Three times a day (TID) | ORAL | 0 refills | Status: DC | PRN

## 2016-04-13 NOTE — Assessment & Plan Note (Signed)
This appears to be progressing and may be a principal cause of her weight loss. Discussed this with her daughter [name] today.

## 2016-04-13 NOTE — Assessment & Plan Note (Signed)
Cr at baseline. Push fluids.

## 2016-04-13 NOTE — Assessment & Plan Note (Signed)
Rx written for marinol twice daily and as needed zofran.

## 2016-04-13 NOTE — Progress Notes (Signed)
Subjective:   Patient ID: Joy [name], female    DOB: 06/13/1930, 83 y.o.   MRN: [id number]  Joy [name] is a pleasant 83 y.o. year old female who presents to clinic today with her daughter, Joy [name], for  Hospitalization Follow-up [facility])  on 04/13/2016  HPI:  Admitted to [facility] 4/1- 04/07/16.  Notes and studies reviewed. Presented with hypotension and acute respiratory failure.  O2 requirement is 2.5 L at baseline.  CT showed ? PNA with severe emphysema.  Given IVFs and abx.  Lab Results  Component Value Date   CREATININE 1.24 (H) 04/06/2016    Discharged home on amoxicillin and oral prednisone taper.  30 pound weight loss since February.  [name] admits to being nauseated most days.  No vomiting. Denies feeling depressed.  Dg Chest 1 View  Result Date: 04/05/2016 CLINICAL DATA:  Shortness of Breath EXAM: CHEST 1 VIEW COMPARISON:  April 04, 2016 and January 15, 2015 chest radiograph; chest CT August 08, 2015 FINDINGS: There is underlying interstitial fibrosis. There are small pleural effusions bilaterally. There is no frank edema or consolidation. Heart is mildly enlarged with pulmonary vascularity within normal limits. There is atherosclerotic calcification in the aorta. No adenopathy. No bone lesions. IMPRESSION: Underlying reticular interstitial disease. Stable cardiac prominence. There are small pleural effusions bilaterally. No frank edema or consolidation. There is aortic atherosclerosis. Electronically Signed   By: Lowella Grip III M.D.   On: 04/05/2016 08:19   Dg Chest 1 View  Result Date: 04/04/2016 CLINICAL DATA:  Fall.  Trauma. EXAM: CHEST 1 VIEW COMPARISON:  06/26/2015 chest radiograph. FINDINGS: Stable cardiomediastinal silhouette with top-normal heart size and aortic atherosclerosis. No pneumothorax. No pleural effusion. No pulmonary edema. Patchy basilar predominant reticular opacities in both lungs appear stable. Emphysema. No acute consolidative  airspace disease. No displaced fractures. IMPRESSION: 1. No acute pulmonary disease. 2. Stable patchy basilar predominant reticular opacities in both lungs, which may indicate an interstitial lung disease. 3. Aortic atherosclerosis. Electronically Signed   By: Ilona Sorrel M.D.   On: 04/04/2016 16:31   Ct Head Wo Contrast  Result Date: 04/04/2016 CLINICAL DATA:  Fall.  Head injury confusion EXAM: CT HEAD WITHOUT CONTRAST TECHNIQUE: Contiguous axial images were obtained from the base of the skull through the vertex without intravenous contrast. COMPARISON:  CT head 08/09/2015 FINDINGS: Brain: Cerebral atrophy unchanged. Mild chronic ischemic change in the white matter stable. Negative for acute infarct.  Negative for acute hemorrhage or mass. Vascular: Negative for hyperdense vessel Skull: Negative Sinuses/Orbits: Negative Other: None IMPRESSION: No acute intracranial abnormality. Atrophy with mild chronic ischemic change in the white matter. Electronically Signed   By: Franchot Gallo M.D.   On: 04/04/2016 16:10   Ct Angio Chest Pe W Or Wo Contrast  Result Date: 04/06/2016 CLINICAL DATA:  Hypoxia.  Inpatient. EXAM: CT ANGIOGRAPHY CHEST WITH CONTRAST TECHNIQUE: Multidetector CT imaging of the chest was performed using the standard protocol during bolus administration of intravenous contrast. Multiplanar CT image reconstructions and MIPs were obtained to evaluate the vascular anatomy. CONTRAST:  60 cc Isovue 370 IV. COMPARISON:  Chest radiograph from earlier today. 08/08/2015 chest CT. FINDINGS: Cardiovascular: The study is low to moderate quality for the evaluation of pulmonary embolism, with evaluation of the segmental and subsegmental pulmonary arteries largely precluded due to motion artifact. There are no filling defects in the central or lobar pulmonary artery branches to suggest acute pulmonary embolism. Atherosclerotic nonaneurysmal thoracic aorta. Normal caliber pulmonary arteries. Normal heart size. No  significant pericardial fluid/thickening. Left anterior descending, left circumflex and right coronary atherosclerosis. Mediastinum/Nodes: No discrete thyroid nodules. Unremarkable esophagus. No pathologically enlarged axillary, mediastinal or hilar lymph nodes. Lungs/Pleura: No pneumothorax. Small dependent bilateral pleural effusions. Severe centrilobular emphysema and diffuse bronchial wall thickening. Consolidation and volume loss in the dependent lower lobes bilaterally. Small bandlike opacity in the peripheral right middle lobe, stable, compatible with postinfectious/postinflammatory scarring. No lung masses or significant pulmonary nodules in the aerated portions of the lungs. Upper abdomen: Unremarkable. Musculoskeletal: No aggressive appearing focal osseous lesions. Mild compression fractures in the T7 and T9 vertebral bodies are stable since 08/08/2015. Moderate thoracic spondylosis. Review of the MIP images confirms the above findings. IMPRESSION: 1. Limited motion degraded scan. No central or lobar pulmonary embolism. 2. Small dependent bilateral pleural effusions. 3. Consolidation and volume loss in the dependent bilateral lower lobes, favor atelectasis, difficult to exclude a component of pneumonia or aspiration. 4. Severe emphysema with diffuse bronchial wall thickening, suggesting COPD. 5. Aortic atherosclerosis.  Three-vessel coronary atherosclerosis. Electronically Signed   By: Ilona Sorrel M.D.   On: 04/06/2016 13:27     Current Outpatient Prescriptions on File Prior to Visit  Medication Sig Dispense Refill  . acetaminophen (TYLENOL) 650 MG CR tablet Take 650 mg by mouth every 8 (eight) hours as needed for pain.    Marland Kitchen ADVAIR DISKUS 250-50 MCG/DOSE AEPB INHALE 1 PUFF BY MOUTH TWICE A DAY 60 each 0  . albuterol (PROVENTIL HFA;VENTOLIN HFA) 108 (90 Base) MCG/ACT inhaler Inhale 2 puffs into the lungs every 6 (six) hours as needed for wheezing or shortness of breath. 1 Inhaler 0  . albuterol  (PROVENTIL) (2.5 MG/3ML) 0.083% nebulizer solution Take 3 mLs (2.5 mg total) by nebulization every 6 (six) hours as needed for wheezing or shortness of breath. 150 mL 1  . amLODipine (NORVASC) 5 MG tablet Take 1 tablet (5 mg total) by mouth daily. 30 tablet 5  . amoxicillin-clavulanate (AUGMENTIN) 875-125 MG tablet Take 1 tablet by mouth 2 (two) times daily. 8 tablet 0  . atorvastatin (LIPITOR) 40 MG tablet TAKE 1 TABLET BY MOUTH EVERY DAY (Patient taking differently: TAKE 0.5  TABLET BY MOUTH EVERY DAY) 30 tablet 1  . buPROPion (WELLBUTRIN XL) 150 MG 24 hr tablet Take 150 mg by mouth daily.    . clopidogrel (PLAVIX) 75 MG tablet Take 1 tablet (75 mg total) by mouth daily. 90 tablet 1  . Diphenhyd-Hydrocort-Nystatin (FIRST-DUKES MOUTHWASH) SUSP Use as directed 5 mLs in the mouth or throat 3 (three) times daily. 237 mL 0  . fluconazole (DIFLUCAN) 100 MG tablet Take 1 tablet (100 mg total) by mouth daily. 5 tablet 0  . ipratropium-albuterol (DUONEB) 0.5-2.5 (3) MG/3ML SOLN Take 3 mLs by nebulization every 4 (four) hours as needed.    Marland Kitchen levothyroxine (SYNTHROID, LEVOTHROID) 25 MCG tablet Take 25 mcg by mouth daily before breakfast.    . lisinopril (PRINIVIL,ZESTRIL) 10 MG tablet Take 1 tablet (10 mg total) by mouth daily. 30 tablet 5  . loratadine (CLARITIN) 10 MG tablet Take 10 mg by mouth daily.    . memantine (NAMENDA) 10 MG tablet Take 10 mg by mouth 2 (two) times daily.    . nitroGLYCERIN (NITROSTAT) 0.4 MG SL tablet Place 0.4 mg under the tongue every 5 (five) minutes as needed.      . phenol (CHLORASEPTIC) 1.4 % LIQD Use as directed 1 spray in the mouth or throat 4 (four) times daily. 1 Bottle 0  . predniSONE (STERAPRED  UNI-PAK 21 TAB) 10 MG (21) TBPK tablet Start 60 mg po daily, taper 10 mg daily until done 21 tablet 0  . ranitidine (ZANTAC) 300 MG tablet TAKE 1 TABLET (300 MG TOTAL) BY MOUTH AT BEDTIME. (Patient taking differently: TAKE 0.5 TABLET (150 MG TOTAL) BY MOUTH AT BEDTIME.) 90 tablet  2  . sertraline (ZOLOFT) 100 MG tablet TAKE 1 TABLET BY MOUTH EVERY DAY (Patient taking differently: TAKE 1.5 TABLET BY MOUTH EVERY DAY) 30 tablet 1  . SPIRIVA HANDIHALER 18 MCG inhalation capsule PLACE 1 CAPSULE (18 MCG TOTAL) INTO INHALER AND INHALE DAILY. 30 capsule 10  . vitamin B-12 (CYANOCOBALAMIN) 1000 MCG tablet Take 1,000 mcg by mouth daily.     No current facility-administered medications on file prior to visit.     Allergies  Allergen Reactions  . Codeine Other (See Comments)    Coma for 2 days  . Prednisone Other (See Comments)    Weight gain,puffy face  . Shellfish Allergy     Past Medical History:  Diagnosis Date  . Alzheimer disease   . Anxiety   . CAD (coronary artery disease)    h/o NSTEMI, LAD stent placement 10/04, cath 05/11/2010  . Carotid artery occlusion   . COPD (chronic obstructive pulmonary disease) ([place])   . Depression   . Diastolic dysfunction    Echo 05/11/2010, EF 65-70%  . HLD (hyperlipidemia)   . HTN (hypertension)   . Hypertension   . Kidney infection   . PMR (polymyalgia rheumatica) ([facility])    with h/o steroid induced myopathy  . Thyroid disease   . TIA (transient ischemic attack)    11/05 s/p L CEA    Past Surgical History:  Procedure Laterality Date  . CARDIAC CATHETERIZATION     unc  . CARDIAC CATHETERIZATION    . CARDIAC CATHETERIZATION    . CAROTID ENDARTERECTOMY     left with stent placement 11/2003  . ILIAC ARTERY STENT     right, 2001  . LUNG BIOPSY      No family history on file.  Social History   Social History  . Marital status: Married    Spouse name: N/A  . Number of children: N/A  . Years of education: N/A   Occupational History  . retired     Pharmacist, hospital   Social History Main Topics  . Smoking status: Former Smoker    Packs/day: 1.00    Years: 25.00    Types: Cigarettes    Quit date: 01/05/1996  . Smokeless tobacco: Never Used  . Alcohol use No  . Drug use: No  . Sexual activity: No   Other Topics  Concern  . Not on file   Social History Narrative   Lives with husband, [name] at [place].   Daughters, [name] and [name] very involved.   The PMH, PSH, Social History, Family History, Medications, and allergies have been reviewed in [facility], and have been updated if relevant.   Review of Systems  Constitutional: Positive for fatigue and unexpected weight change. Negative for fever.  Respiratory: Positive for shortness of breath.   Gastrointestinal: Positive for diarrhea and nausea. Negative for abdominal distention, abdominal pain, anal bleeding, blood in stool, constipation, rectal pain and vomiting.  Hematological: Negative.   Psychiatric/Behavioral: Negative.   All other systems reviewed and are negative.      Objective:    BP 136/82   Pulse 74   Wt 162 lb 5 oz (73.6 kg) Comment: PER WT CHECK  4/9 @ [facility]  SpO2 98% Comment: 3L O2  BMI 27.01 kg/m    Physical Exam  Constitutional: She is oriented to person, place, and time. She appears well-developed and well-nourished. No distress.  Visibly thinner O2 per [place]  HENT:  Head: Normocephalic and atraumatic.  Eyes: Conjunctivae are normal.  Cardiovascular: Normal rate.   Pulmonary/Chest: Effort normal.  Musculoskeletal: She exhibits no edema.  Neurological: She is alert and oriented to person, place, and time. No cranial nerve deficit.  Skin: Skin is warm and dry. She is not diaphoretic.  Nursing note and vitals reviewed.         Assessment & Plan:   Chronic obstructive pulmonary disease with acute lower respiratory infection ([place])  DNR (do not resuscitate)  Acute renal failure superimposed on stage 3 chronic kidney disease, unspecified acute renal failure type ([place])  Altered mental status, unspecified altered mental status type No Follow-up on file.

## 2016-04-13 NOTE — Assessment & Plan Note (Signed)
With acute resp failure. At baseline now for O2 requirement. Finish abx and pred taper.

## 2016-05-10 ENCOUNTER — Telehealth: Payer: Self-pay | Admitting: Family Medicine

## 2016-05-10 NOTE — Telephone Encounter (Signed)
Left pt message asking to call Allison back directly at [phone] to schedule AWV.+ labs with Lesia and CPE with PCP. °

## 2016-05-21 ENCOUNTER — Telehealth: Payer: Self-pay | Admitting: Cardiovascular Disease

## 2016-05-21 NOTE — Telephone Encounter (Signed)
2attempts to schedule fu carotid

## 2016-05-28 ENCOUNTER — Encounter: Payer: Self-pay | Admitting: Cardiovascular Disease

## 2016-05-28 ENCOUNTER — Ambulatory Visit (INDEPENDENT_AMBULATORY_CARE_PROVIDER_SITE_OTHER): Payer: Medicare Other | Admitting: Cardiovascular Disease

## 2016-05-28 VITALS — BP 116/60 | HR 75 | Ht 64.0 in | Wt 166.8 lb

## 2016-05-28 DIAGNOSIS — I10 Essential (primary) hypertension: Secondary | ICD-10-CM | POA: Diagnosis not present

## 2016-05-28 DIAGNOSIS — I5032 Chronic diastolic (congestive) heart failure: Secondary | ICD-10-CM | POA: Diagnosis not present

## 2016-05-28 DIAGNOSIS — I2510 Atherosclerotic heart disease of native coronary artery without angina pectoris: Secondary | ICD-10-CM

## 2016-05-28 DIAGNOSIS — E785 Hyperlipidemia, unspecified: Secondary | ICD-10-CM

## 2016-05-28 NOTE — Progress Notes (Signed)
Cardiology Office Note   Date:  05/28/2016   ID:  Joy [name], DOB January 28, 1930, MRN [id number]  PCP:  Lucille Passy, MD  Cardiologist:   Kathlyn Sacramento, MD   Chief Complaint  Patient presents with  . other    6 month follow up. Patient c/o Swelling in ankles. Meds reviewed verbally with patient.       History of Present Illness: Joy [name] is a 83 y.o. female who presents for a followup visit regarding coronary artery disease and chronic diastolic heart failure. She has known history of coronary artery disease with previous LAD stent in 2004 at [facility], vascular dementia, CVA, carotid artery stenting, chronic diastolic heart failure, peripheral arterial disease, hypertension and hyperlipidemia. Echocardiogram in 2013 showed normal LV systolic function.  Most recent nuclear stress test in 2015 showed no evidence of ischemia with normal ejection fraction. She has history of bradycardia that resolved after stopping metoprolol. Renal artery duplex in 2016 showed no evidence of renal artery stenosis.  She had recurrent UTIs in 2017 with failure to thrive and poor appetite. She lost about 40 pounds since last year and was hospitalized in April with acute renal failure due to dehydration. She improved with IV fluids. She was placed on Marinol with subsequent improvement in appetite. She continues to live at [facility]. Her memory is getting worse. She is a poor historian but denies any chest pain, shortness of breath or palpitations. She has chronic leg edema but that has improved recently.  Past Medical History:  Diagnosis Date  . Alzheimer disease   . Anxiety   . CAD (coronary artery disease)    h/o NSTEMI, LAD stent placement 10/04, cath 05/11/2010  . Carotid artery occlusion   . COPD (chronic obstructive pulmonary disease) ([place])   . Depression   . Diastolic dysfunction    Echo 05/11/2010, EF 65-70%  . HLD (hyperlipidemia)   . HTN (hypertension)   . Hypertension   . Kidney  infection   . PMR (polymyalgia rheumatica) ([facility])    with h/o steroid induced myopathy  . Thyroid disease   . TIA (transient ischemic attack)    11/05 s/p L CEA    Past Surgical History:  Procedure Laterality Date  . CARDIAC CATHETERIZATION     unc  . CARDIAC CATHETERIZATION    . CARDIAC CATHETERIZATION    . CAROTID ENDARTERECTOMY     left with stent placement 11/2003  . ILIAC ARTERY STENT     right, 2001  . LUNG BIOPSY       Current Outpatient Prescriptions  Medication Sig Dispense Refill  . acetaminophen (TYLENOL) 650 MG CR tablet Take 650 mg by mouth every 8 (eight) hours as needed for pain.    Marland Kitchen ADVAIR DISKUS 250-50 MCG/DOSE AEPB INHALE 1 PUFF BY MOUTH TWICE A DAY 60 each 0  . albuterol (PROVENTIL HFA;VENTOLIN HFA) 108 (90 Base) MCG/ACT inhaler Inhale 2 puffs into the lungs every 6 (six) hours as needed for wheezing or shortness of breath. 1 Inhaler 0  . albuterol (PROVENTIL) (2.5 MG/3ML) 0.083% nebulizer solution Take 3 mLs (2.5 mg total) by nebulization every 6 (six) hours as needed for wheezing or shortness of breath. 150 mL 1  . amLODipine (NORVASC) 5 MG tablet Take 1 tablet (5 mg total) by mouth daily. 30 tablet 5  . atorvastatin (LIPITOR) 40 MG tablet TAKE 1 TABLET BY MOUTH EVERY DAY (Patient taking differently: TAKE 0.5  TABLET BY MOUTH EVERY DAY) 30 tablet 1  .  buPROPion (WELLBUTRIN XL) 150 MG 24 hr tablet Take 150 mg by mouth daily.    . clopidogrel (PLAVIX) 75 MG tablet Take 1 tablet (75 mg total) by mouth daily. 90 tablet 1  . Diphenhyd-Hydrocort-Nystatin (FIRST-DUKES MOUTHWASH) SUSP Use as directed 5 mLs in the mouth or throat 3 (three) times daily. 237 mL 0  . dronabinol (MARINOL) 2.5 MG capsule Take 1 capsule (2.5 mg total) by mouth 2 (two) times daily before lunch and supper. 60 capsule 1  . levothyroxine (SYNTHROID, LEVOTHROID) 25 MCG tablet Take 25 mcg by mouth daily before breakfast.    . lisinopril (PRINIVIL,ZESTRIL) 10 MG tablet Take 1 tablet (10 mg total) by  mouth daily. 30 tablet 5  . loratadine (CLARITIN) 10 MG tablet Take 10 mg by mouth daily.    . memantine (NAMENDA) 10 MG tablet Take 10 mg by mouth 2 (two) times daily.    . nitroGLYCERIN (NITROSTAT) 0.4 MG SL tablet Place 0.4 mg under the tongue every 5 (five) minutes as needed.      . ondansetron (ZOFRAN) 4 MG tablet Take 1 tablet (4 mg total) by mouth every 8 (eight) hours as needed for nausea or vomiting. 30 tablet 0  . pantoprazole (PROTONIX) 40 MG tablet Take 40 mg by mouth daily.    . phenol (CHLORASEPTIC) 1.4 % LIQD Use as directed 1 spray in the mouth or throat 4 (four) times daily. 1 Bottle 0  . sertraline (ZOLOFT) 100 MG tablet TAKE 1 TABLET BY MOUTH EVERY DAY (Patient taking differently: TAKE 1.5 TABLET BY MOUTH EVERY DAY) 30 tablet 1  . SPIRIVA HANDIHALER 18 MCG inhalation capsule PLACE 1 CAPSULE (18 MCG TOTAL) INTO INHALER AND INHALE DAILY. 30 capsule 10  . vitamin B-12 (CYANOCOBALAMIN) 1000 MCG tablet Take 1,000 mcg by mouth daily.     No current facility-administered medications for this visit.     Allergies:   Codeine; Prednisone; and Shellfish allergy    Social History:  The patient  reports that she quit smoking about 20 years ago. Her smoking use included Cigarettes. She has a 25.00 pack-year smoking history. She has never used smokeless tobacco. She reports that she does not drink alcohol or use drugs.   Family History:  The patient's family history is not on file.    ROS:  Please see the history of present illness.   Otherwise, review of systems are positive for none.   All other systems are reviewed and negative.    PHYSICAL EXAM: VS:  BP 116/60 (BP Location: Right Arm, Patient Position: Sitting, Cuff Size: Normal)   Pulse 75   Ht 5\' 4"  (1.626 m)   Wt 166 lb 12 oz (75.6 kg)   BMI 28.62 kg/m  , BMI Body mass index is 28.62 kg/m. GEN: Well nourished, well developed, in no acute distress HEENT: normal Neck: no JVD, carotid bruits, or masses Cardiac: RRR; no   rubs, or gallops. 1/6 early peaking systolic murmur in the aortic area. Mild bilateral leg edema Respiratory:  clear to auscultation bilaterally, normal work of breathing GI: soft, nontender, nondistended, + BS MS: no deformity or atrophy Skin: warm and dry, no rash Neuro:  Strength and sensation are intact Psych: euthymic mood, full affect   EKG:  EKG is ordered today. The ekg ordered today demonstrates normal sinus rhythm with no significant ST or T wave changes.   Recent Labs: 04/04/2016: ALT 7; TSH 2.650 04/05/2016: Hemoglobin 10.6; Platelets 250 04/06/2016: BUN 28; Creatinine, Ser 1.24; Potassium 4.5;  Sodium 144    Lipid Panel    Component Value Date/Time   CHOL 180 01/02/2015 1103   CHOL 142 08/20/2012 0026   TRIG 263.0 (H) 01/02/2015 1103   TRIG 137 08/20/2012 0026   HDL 41.60 01/02/2015 1103   HDL 39 (L) 08/20/2012 0026   CHOLHDL 4 01/02/2015 1103   VLDL 52.6 (H) 01/02/2015 1103   VLDL 27 08/20/2012 0026   LDLCALC 68 11/04/2014 1211   LDLCALC 76 08/20/2012 0026   LDLDIRECT 99.0 01/02/2015 1103      Wt Readings from Last 3 Encounters:  05/28/16 166 lb 12 oz (75.6 kg)  04/13/16 162 lb 5 oz (73.6 kg)  04/07/16 176 lb 6.4 oz (80 kg)       ASSESSMENT AND PLAN:  1.  Coronary artery disease involving native coronary arteries without angina: Continue medical therapy.  2. Chronic diastolic heart failure: She appears to be euvolemic. Continue to monitor weight. She has been having more issues with dehydration and volume overload. She is currently not on any diuretics.  3. Essential hypertension: Blood pressure is  controlled on current medications.  4. Hyperlipidemia: Continue treatment with atorvastatin. Most recent LDL was 68.    Disposition:   FU with me in 6 months  Signed,  Kathlyn Sacramento, MD  05/28/2016 10:59 AM    [place]

## 2016-05-28 NOTE — Patient Instructions (Signed)
Medication Instructions: Continue same medications.   Labwork: None.   Procedures/Testing: None.   Follow-Up: 6 months with Dr. Arida.   Any Additional Special Instructions Will Be Listed Below (If Applicable).     If you need a refill on your cardiac medications before your next appointment, please call your pharmacy.   

## 2016-06-04 NOTE — Telephone Encounter (Signed)
Left pt message asking to call Allison back directly at [phone] to schedule AWV.+ labs with Lesia and CPE with PCP. °

## 2016-06-11 ENCOUNTER — Telehealth: Payer: Self-pay

## 2016-06-11 NOTE — Telephone Encounter (Signed)
PLEASE NOTE: All timestamps contained within this report are represented as [place] Standard Time. CONFIDENTIALTY NOTICE: This fax transmission is intended only for the addressee. It contains information that is legally privileged, confidential or otherwise protected from use or disclosure. If you are not the intended recipient, you are strictly prohibited from reviewing, disclosing, copying using or disseminating any of this information or taking any action in reliance on or regarding this information. If you have received this fax in error, please notify us immediately by telephone so that we can arrange for its return to [place]. Phone: [phone], Toll-Free: [phone], Fax: [phone] Page: 1 of 1 Call Id: [id number] [place] Night - Client Nonclinical Telephone Record [place] Night - Client Client Site [place] - Night Contact Type Call Call [place] Page Now Who Is [place] [facility] [place] Name [facility] Name [facility] Number [phone] Patient Name Joy [name] Patient DOB 1930/06/30 Reason for Call Report a patient fall Initial Comment Caller is reporting a patient fall and needs orders. Additional Comment Paging DoctorName Phone DateTime Result/Outcome Message Type Notes Cathlean Cower- MD [phone] 06/10/2016 9:40:08 PM Called On Call Provider - Reached Doctor Paged Cathlean Cower- MD 06/10/2016 9:41:40 PM Spoke with On Call - General Message Result Spoke with on call and connected with the facility. Call Closed By: Prescott Gum Transaction Date/Time: 06/10/2016 9:23:26 PM (ET)

## 2016-06-11 NOTE — Telephone Encounter (Signed)
I will review her status when I go there this afternoon

## 2016-06-16 ENCOUNTER — Ambulatory Visit: Payer: Medicare Other

## 2016-06-16 DIAGNOSIS — I6523 Occlusion and stenosis of bilateral carotid arteries: Secondary | ICD-10-CM | POA: Diagnosis not present

## 2016-06-16 LAB — VAS US CAROTID
LCCADDIAS: 0 cm/s
LCCAPDIAS: 0 cm/s
LEFT ECA DIAS: 0 cm/s
LEFT VERTEBRAL DIAS: -8 cm/s
LICADSYS: -106 cm/s
LICAPDIAS: 11 cm/s
Left CCA dist sys: -98 cm/s
Left CCA prox sys: 112 cm/s
Left ICA dist dias: -14 cm/s
Left ICA prox sys: 95 cm/s
RCCADSYS: -119 cm/s
RIGHT ECA DIAS: 0 cm/s
RIGHT VERTEBRAL DIAS: -10 cm/s
Right CCA prox dias: 9 cm/s
Right CCA prox sys: 81 cm/s

## 2016-06-19 ENCOUNTER — Other Ambulatory Visit: Payer: Self-pay | Admitting: Family Medicine

## 2016-06-19 DIAGNOSIS — I10 Essential (primary) hypertension: Secondary | ICD-10-CM

## 2016-06-19 DIAGNOSIS — E785 Hyperlipidemia, unspecified: Secondary | ICD-10-CM

## 2016-06-19 DIAGNOSIS — E079 Disorder of thyroid, unspecified: Secondary | ICD-10-CM

## 2016-06-23 ENCOUNTER — Ambulatory Visit: Payer: Medicare Other

## 2016-06-30 ENCOUNTER — Ambulatory Visit: Payer: Medicare Other

## 2016-07-05 ENCOUNTER — Other Ambulatory Visit: Payer: Self-pay

## 2016-07-05 DIAGNOSIS — I779 Disorder of arteries and arterioles, unspecified: Secondary | ICD-10-CM

## 2016-07-05 DIAGNOSIS — I739 Peripheral vascular disease, unspecified: Principal | ICD-10-CM

## 2016-07-06 NOTE — Progress Notes (Deleted)
Subjective:   Joy [name] is a 83 y.o. female who presents for an Initial Medicare Annual Wellness Visit.  Review of Systems    No ROS.  Medicare Wellness Visit. Additional risk factors are reflected in the social history.        Objective:    There were no vitals filed for this visit. There is no height or weight on file to calculate BMI.   Current Medications (verified) Outpatient Encounter Prescriptions as of 07/14/2016  Medication Sig  . acetaminophen (TYLENOL) 650 MG CR tablet Take 650 mg by mouth every 8 (eight) hours as needed for pain.  Marland Kitchen ADVAIR DISKUS 250-50 MCG/DOSE AEPB INHALE 1 PUFF BY MOUTH TWICE A DAY  . albuterol (PROVENTIL HFA;VENTOLIN HFA) 108 (90 Base) MCG/ACT inhaler Inhale 2 puffs into the lungs every 6 (six) hours as needed for wheezing or shortness of breath.  Marland Kitchen albuterol (PROVENTIL) (2.5 MG/3ML) 0.083% nebulizer solution Take 3 mLs (2.5 mg total) by nebulization every 6 (six) hours as needed for wheezing or shortness of breath.  Marland Kitchen amLODipine (NORVASC) 5 MG tablet Take 1 tablet (5 mg total) by mouth daily.  Marland Kitchen atorvastatin (LIPITOR) 40 MG tablet TAKE 1 TABLET BY MOUTH EVERY DAY (Patient taking differently: TAKE 0.5  TABLET BY MOUTH EVERY DAY)  . buPROPion (WELLBUTRIN XL) 150 MG 24 hr tablet Take 150 mg by mouth daily.  . clopidogrel (PLAVIX) 75 MG tablet Take 1 tablet (75 mg total) by mouth daily.  . Diphenhyd-Hydrocort-Nystatin (FIRST-DUKES MOUTHWASH) SUSP Use as directed 5 mLs in the mouth or throat 3 (three) times daily.  Marland Kitchen dronabinol (MARINOL) 2.5 MG capsule Take 1 capsule (2.5 mg total) by mouth 2 (two) times daily before lunch and supper.  . levothyroxine (SYNTHROID, LEVOTHROID) 25 MCG tablet Take 25 mcg by mouth daily before breakfast.  . lisinopril (PRINIVIL,ZESTRIL) 10 MG tablet Take 1 tablet (10 mg total) by mouth daily.  Marland Kitchen loratadine (CLARITIN) 10 MG tablet Take 10 mg by mouth daily.  . memantine (NAMENDA) 10 MG tablet Take 10 mg by mouth 2  (two) times daily.  . nitroGLYCERIN (NITROSTAT) 0.4 MG SL tablet Place 0.4 mg under the tongue every 5 (five) minutes as needed.    . ondansetron (ZOFRAN) 4 MG tablet Take 1 tablet (4 mg total) by mouth every 8 (eight) hours as needed for nausea or vomiting.  . pantoprazole (PROTONIX) 40 MG tablet Take 40 mg by mouth daily.  . phenol (CHLORASEPTIC) 1.4 % LIQD Use as directed 1 spray in the mouth or throat 4 (four) times daily.  . sertraline (ZOLOFT) 100 MG tablet TAKE 1 TABLET BY MOUTH EVERY DAY (Patient taking differently: TAKE 1.5 TABLET BY MOUTH EVERY DAY)  . SPIRIVA HANDIHALER 18 MCG inhalation capsule PLACE 1 CAPSULE (18 MCG TOTAL) INTO INHALER AND INHALE DAILY.  . vitamin B-12 (CYANOCOBALAMIN) 1000 MCG tablet Take 1,000 mcg by mouth daily.   No facility-administered encounter medications on file as of 07/14/2016.     Allergies (verified) Codeine; Prednisone; and Shellfish allergy   History: Past Medical History:  Diagnosis Date  . Alzheimer disease   . Anxiety   . CAD (coronary artery disease)    h/o NSTEMI, LAD stent placement 10/04, cath 05/11/2010  . Carotid artery occlusion   . COPD (chronic obstructive pulmonary disease) ([place])   . Depression   . Diastolic dysfunction    Echo 05/11/2010, EF 65-70%  . HLD (hyperlipidemia)   . HTN (hypertension)   . Hypertension   . Kidney  infection   . PMR (polymyalgia rheumatica) ([facility])    with h/o steroid induced myopathy  . Thyroid disease   . TIA (transient ischemic attack)    11/05 s/p L CEA   Past Surgical History:  Procedure Laterality Date  . CARDIAC CATHETERIZATION     unc  . CARDIAC CATHETERIZATION    . CARDIAC CATHETERIZATION    . CAROTID ENDARTERECTOMY     left with stent placement 11/2003  . ILIAC ARTERY STENT     right, 2001  . LUNG BIOPSY     No family history on file. Social History   Occupational History  . retired     Pharmacist, hospital   Social History Main Topics  . Smoking status: Former Smoker    Packs/day: 1.00     Years: 25.00    Types: Cigarettes    Quit date: 01/05/1996  . Smokeless tobacco: Never Used  . Alcohol use No  . Drug use: No  . Sexual activity: No    Tobacco Counseling Counseling given: Not Answered   Activities of Daily Living In your present state of health, do you have any difficulty performing the following activities: 04/04/2016 10/04/2015  Hearing? N N  Vision? N N  Difficulty concentrating or making decisions? N Y  Walking or climbing stairs? Y Y  Dressing or bathing? Y Y  Doing errands, shopping? Tempie Donning  Some recent data might be hidden    Immunizations and Health Maintenance Immunization History  Administered Date(s) Administered  . Influenza Split 01/14/2011  . Influenza,inj,Quad PF,36+ Mos 10/13/2015  . Influenza-Unspecified 09/15/2012, 10/30/2014   Health Maintenance Due  Topic Date Due  . TETANUS/TDAP  12/13/1949  . PNA vac Low Risk Adult (1 of 2 - PCV13) 12/12/1997    Patient Care Team: Lucille Passy, MD as PCP - General (Family Medicine) Wellington Hampshire, MD as Consulting Physician (Cardiology)  Indicate any recent Medical Services you may have received from other than [facility] providers in the past year (date may be approximate).     Assessment:   This is a routine wellness examination for [place]. Physical assessment deferred to PCP.  Hearing/Vision screen No exam data present  Dietary issues and exercise activities discussed:    Goals    None     Depression Screen No flowsheet data found.  Fall Risk No flowsheet data found.  Cognitive Function:        Screening Tests Health Maintenance  Topic Date Due  . TETANUS/TDAP  12/13/1949  . PNA vac Low Risk Adult (1 of 2 - PCV13) 12/12/1997  . INFLUENZA VACCINE  08/04/2016  . DEXA SCAN  Completed      Plan:    Follow-up w/ PCP as scheduled.  I have personally reviewed and noted the following in the patient's chart:   . Medical and social history . Use of alcohol, tobacco or illicit  drugs  . Current medications and supplements . Functional ability and status . Nutritional status . Physical activity . Advanced directives . List of other physicians . Vitals . Screenings to include cognitive, depression, and falls . Referrals and appointments  In addition, I have reviewed and discussed with patient certain preventive protocols, quality metrics, and best practice recommendations. A written personalized care plan for preventive services as well as general preventive health recommendations were provided to patient.     Dorrene German, RN   07/06/2016

## 2016-07-08 NOTE — Progress Notes (Deleted)
PCP notes:   Health maintenance:   Abnormal screenings:    Patient concerns:    Nurse concerns:   Next PCP appt:    

## 2016-07-09 DIAGNOSIS — H10011 Acute follicular conjunctivitis, right eye: Secondary | ICD-10-CM | POA: Diagnosis not present

## 2016-07-14 ENCOUNTER — Other Ambulatory Visit: Payer: Self-pay

## 2016-07-14 ENCOUNTER — Ambulatory Visit: Payer: Medicare Other

## 2016-07-21 ENCOUNTER — Ambulatory Visit (INDEPENDENT_AMBULATORY_CARE_PROVIDER_SITE_OTHER): Payer: Medicare Other

## 2016-07-21 ENCOUNTER — Other Ambulatory Visit (INDEPENDENT_AMBULATORY_CARE_PROVIDER_SITE_OTHER): Payer: Medicare Other

## 2016-07-21 VITALS — BP 106/48 | HR 80 | Ht 63.0 in | Wt 155.0 lb

## 2016-07-21 DIAGNOSIS — Z0000 Encounter for general adult medical examination without abnormal findings: Secondary | ICD-10-CM | POA: Diagnosis not present

## 2016-07-21 DIAGNOSIS — E079 Disorder of thyroid, unspecified: Secondary | ICD-10-CM

## 2016-07-21 DIAGNOSIS — E785 Hyperlipidemia, unspecified: Secondary | ICD-10-CM

## 2016-07-21 NOTE — Progress Notes (Signed)
Subjective:   Joy [name] is a 83 y.o. female who presents for an Initial Medicare Annual Wellness Visit. She is here today with her daughter, Joy [name], and her granddaughter.   The Patient was informed that the wellness visit is to identify future health risk and educate and initiate measures that can reduce risk for increased disease through the lifespan.   Describes health as fair, good or great? "Pretty fair."  Review of Systems    No ROS.  Medicare Wellness Visit. Additional risk factors are reflected in the social history.   Cardiac Risk Factors include: advanced age (>7men, >75 women);dyslipidemia;sedentary lifestyle;hypertension     Objective:    Today's Vitals   07/21/16 1459  BP: (!) 106/48  Pulse: 80  SpO2: 96%  Weight: 155 lb (70.3 kg)  Height: 5\' 3"  (1.6 m)   Body mass index is 27.46 kg/m.   Current Medications (verified) Outpatient Encounter Prescriptions as of 07/21/2016  Medication Sig  . acetaminophen (TYLENOL) 650 MG CR tablet Take 650 mg by mouth every 8 (eight) hours as needed for pain.  Marland Kitchen ADVAIR DISKUS 250-50 MCG/DOSE AEPB INHALE 1 PUFF BY MOUTH TWICE A DAY  . albuterol (PROVENTIL HFA;VENTOLIN HFA) 108 (90 Base) MCG/ACT inhaler Inhale 2 puffs into the lungs every 6 (six) hours as needed for wheezing or shortness of breath.  Marland Kitchen albuterol (PROVENTIL) (2.5 MG/3ML) 0.083% nebulizer solution Take 3 mLs (2.5 mg total) by nebulization every 6 (six) hours as needed for wheezing or shortness of breath.  Marland Kitchen amLODipine (NORVASC) 5 MG tablet Take 1 tablet (5 mg total) by mouth daily.  Marland Kitchen atorvastatin (LIPITOR) 40 MG tablet TAKE 1 TABLET BY MOUTH EVERY DAY (Patient taking differently: TAKE 0.5  TABLET BY MOUTH EVERY DAY)  . buPROPion (WELLBUTRIN XL) 150 MG 24 hr tablet Take 150 mg by mouth daily.  . clopidogrel (PLAVIX) 75 MG tablet Take 1 tablet (75 mg total) by mouth daily.  Marland Kitchen dronabinol (MARINOL) 2.5 MG capsule Take 1 capsule (2.5 mg total) by mouth 2 (two) times  daily before lunch and supper.  . levothyroxine (SYNTHROID, LEVOTHROID) 25 MCG tablet Take 25 mcg by mouth daily before breakfast.  . lisinopril (PRINIVIL,ZESTRIL) 10 MG tablet Take 1 tablet (10 mg total) by mouth daily.  . memantine (NAMENDA) 10 MG tablet Take 10 mg by mouth 2 (two) times daily.  . nitroGLYCERIN (NITROSTAT) 0.4 MG SL tablet Place 0.4 mg under the tongue every 5 (five) minutes as needed.    . ondansetron (ZOFRAN) 4 MG tablet Take 1 tablet (4 mg total) by mouth every 8 (eight) hours as needed for nausea or vomiting.  . pantoprazole (PROTONIX) 40 MG tablet Take 40 mg by mouth daily.  . sertraline (ZOLOFT) 100 MG tablet TAKE 1 TABLET BY MOUTH EVERY DAY  . SPIRIVA HANDIHALER 18 MCG inhalation capsule PLACE 1 CAPSULE (18 MCG TOTAL) INTO INHALER AND INHALE DAILY.  . vitamin B-12 (CYANOCOBALAMIN) 1000 MCG tablet Take 1,000 mcg by mouth daily.  . [DISCONTINUED] Diphenhyd-Hydrocort-Nystatin (FIRST-DUKES MOUTHWASH) SUSP Use as directed 5 mLs in the mouth or throat 3 (three) times daily. (Patient not taking: Reported on 07/21/2016)  . [DISCONTINUED] loratadine (CLARITIN) 10 MG tablet Take 10 mg by mouth daily.  . [DISCONTINUED] phenol (CHLORASEPTIC) 1.4 % LIQD Use as directed 1 spray in the mouth or throat 4 (four) times daily. (Patient not taking: Reported on 07/21/2016)   No facility-administered encounter medications on file as of 07/21/2016.     Allergies (verified) Codeine; Prednisone; and  Shellfish allergy   History: Past Medical History:  Diagnosis Date  . Alzheimer disease   . Anxiety   . CAD (coronary artery disease)    h/o NSTEMI, LAD stent placement 10/04, cath 05/11/2010  . Carotid artery occlusion   . COPD (chronic obstructive pulmonary disease) ([place])   . Depression   . Diastolic dysfunction    Echo 05/11/2010, EF 65-70%  . HLD (hyperlipidemia)   . HTN (hypertension)   . Hypertension   . Kidney infection   . PMR (polymyalgia rheumatica) ([facility])    with h/o steroid  induced myopathy  . Thyroid disease   . TIA (transient ischemic attack)    11/05 s/p L CEA   Past Surgical History:  Procedure Laterality Date  . CARDIAC CATHETERIZATION     unc  . CARDIAC CATHETERIZATION    . CARDIAC CATHETERIZATION    . CAROTID ENDARTERECTOMY     left with stent placement 11/2003  . ILIAC ARTERY STENT     right, 2001  . LUNG BIOPSY     Family History  Problem Relation Age of Onset  . Arthritis Mother   . Arthritis Father   . Glaucoma Father   . Alzheimer's disease Brother   . Heart disease Brother   . Arthritis Brother   . Heart disease Brother   . Arthritis Brother   . Glaucoma Brother    Social History   Occupational History  . retired     Pharmacist, hospital   Social History Main Topics  . Smoking status: Former Smoker    Packs/day: 1.00    Years: 25.00    Types: Cigarettes    Quit date: 01/05/1996  . Smokeless tobacco: Never Used  . Alcohol use No  . Drug use: No  . Sexual activity: No    Tobacco Counseling Counseling given: Not Answered   Activities of Daily Living In your present state of health, do you have any difficulty performing the following activities: 07/21/2016 04/04/2016  Hearing? N N  Vision? N N  Difficulty concentrating or making decisions? Y N  Walking or climbing stairs? N Y  Dressing or bathing? Y Y  Doing errands, shopping? Tempie Donning  Preparing Food and eating ? Y -  Using the Toilet? N -  In the past six months, have you accidently leaked urine? Y -  Do you have problems with loss of bowel control? N -  Managing your Medications? Y -  Managing your Finances? Y -  Housekeeping or managing your Housekeeping? Y -  Some recent data might be hidden    Immunizations and Health Maintenance Immunization History  Administered Date(s) Administered  . Influenza Split 01/14/2011  . Influenza,inj,Quad PF,36+ Mos 10/13/2015  . Influenza-Unspecified 09/15/2012, 10/30/2014   Health Maintenance Due  Topic Date Due  . TETANUS/TDAP   12/13/1949    Patient Care Team: Lucille Passy, MD as PCP - General (Family Medicine) Wellington Hampshire, MD (Cardiology) Mhoon, Larkin Ina, MD (Neurology) Birder Robson, MD (Ophthalmology)  Indicate any recent Medical Services you may have received from other than [facility] providers in the past year (date may be approximate).     Assessment:   This is a routine wellness examination for [place]. Physical assessment deferred to PCP.   Hearing/Vision screen  Hearing Screening   125Hz  250Hz  500Hz  1000Hz  2000Hz  3000Hz  4000Hz  6000Hz  8000Hz   Right ear:   0 0 40  0    Left ear:   0 0 0  0  Visual Acuity Screening   Right eye Left eye Both eyes  Without correction: 20/40-1 20/50 20/40  With correction:       Dietary issues and exercise activities discussed: Current Exercise Habits: The patient does not participate in regular exercise at present  Goals    . Eat more fruits and vegetables          Starting 07/21/2016, I will try to eat more fruit in an effort to stay healthy.       Depression Screen PHQ 2/9 Scores 07/21/2016  PHQ - 2 Score 0    Fall Risk Fall Risk  07/21/2016  Falls in the past year? Yes  Number falls in past yr: 2 or more  Injury with Fall? No  Risk Factor Category  High Fall Risk  Risk for fall due to : Impaired balance/gait;Impaired mobility;History of fall(s)    Cognitive Function: PLEASE NOTE: A Mini-Cog screen was completed. Maximum score is 20. A value of 0 denotes this part of [place] MMSE was not completed or the patient failed this part of the Mini-Cog screening.   Mini-Cog Screening Orientation to Time - Max 5 pts Orientation to Place - Max 5 pts Registration - Max 3 pts Recall - Max 3 pts Language Repeat - Max 1 pts Language Follow 3 Step Command - Max 3 pts      Mini-Cog - 07/21/16 1521    Normal clock drawing test? no   How many words correct? 0      MMSE - Mini Mental State Exam 07/21/2016  Orientation to time 2  Orientation to  time comments Disoriented to date, day of the week, and year.  Orientation to Place 4  Orientation to Place-comments Disoriented to county, but able to name county where she previously lived in [place].  Registration 3  Attention/ Calculation 0  Recall 0  Language- name 2 objects 0  Language- repeat 1  Language- follow 3 step command 3  Language- read & follow direction 0  Write a sentence 0  Copy design 0  Total score 13        Screening Tests Health Maintenance  Topic Date Due  . TETANUS/TDAP  12/13/1949  . PNA vac Low Risk Adult (1 of 2 - PCV13) 07/21/2017 (Originally 12/12/1997)  . INFLUENZA VACCINE  08/04/2016  . DEXA SCAN  Completed      Plan:    Follow-up w/ PCP as scheduled.  I have personally reviewed and noted the following in the patient's chart:   . Medical and social history . Use of alcohol, tobacco or illicit drugs  . Current medications and supplements . Functional ability and status . Nutritional status . Physical activity . Advanced directives . List of other physicians . Vitals . Screenings to include cognitive, depression, and falls . Referrals and appointments  In addition, I have reviewed and discussed with patient certain preventive protocols, quality metrics, and best practice recommendations. A written personalized care plan for preventive services as well as general preventive health recommendations were provided to patient.     Dorrene German, RN   07/21/2016

## 2016-07-21 NOTE — Progress Notes (Signed)
PCP notes:   Health maintenance: Letta Pate, daughter will check w/ insurance regarding coverage and may get vaccine at pharmacy.   Prevnar-13- due, patient has a cough today, so will defer.    Abnormal screenings:  Hearing-failed.   Cognitive-failed.  Falls- >2 in the last year.   Patient concerns:  1) ? Pink eye of R eye-itching, red, and swollen for a few weeks. Also has a small scab like sore on R side of nose that is itchy.   2) Patient reports she now has constant hoarseness. This used to be intermittent. She also has a strong non-productive congested cough x1 week.   3) She is having some constipation. She does not currently take any stool softeners of laxatives. Her PO fluid intake is poor.   4) Weight loss- she is down 11 lbs since last visit. Wt Readings from Last 3 Encounters:  07/21/16 155 lb (70.3 kg)  05/28/16 166 lb 12 oz (75.6 kg)  04/13/16 162 lb 5 oz (73.6 kg)   Nurse concerns: BP borderline low, however patient is fasting and reports poor PO fluid intake.   Next PCP appt: 07/29/2016 @ 1:15pm

## 2016-07-21 NOTE — Patient Instructions (Addendum)
Joy [name] , Thank you for taking time to come for your Medicare Wellness Visit. I appreciate your ongoing commitment to your health goals. Please review the following plan we discussed and let me know if I can assist you in the future.   These are the goals we discussed: Goals    . Eat more fruits and vegetables          Starting 07/21/2016, I will try to eat more fruit in an effort to stay healthy.        This is a list of the screening recommended for you and due dates:  Health Maintenance  Topic Date Due  . Tetanus Vaccine  12/13/1949  . Pneumonia vaccines (1 of 2 - PCV13) 12/12/1997  . Flu Shot  08/04/2016  . DEXA scan (bone density measurement)  Completed   Preventive Care for Adults  A healthy lifestyle and preventive care can promote health and wellness. Preventive health guidelines for adults include the following key practices.  . A routine yearly physical is a good way to check with your health care provider about your health and preventive screening. It is a chance to share any concerns and updates on your health and to receive a thorough exam.  . Visit your dentist for a routine exam and preventive care every 6 months. Brush your teeth twice a day and floss once a day. Good oral hygiene prevents tooth decay and gum disease.  . The frequency of eye exams is based on your age, health, family medical history, use  of contact lenses, and other factors. Follow your health care provider's ecommendations for frequency of eye exams.  . Eat a healthy diet. Foods like vegetables, fruits, whole grains, low-fat dairy products, and lean protein foods contain the nutrients you need without too many calories. Decrease your intake of foods high in solid fats, added sugars, and salt. Eat the right amount of calories for you. Get information about a proper diet from your health care provider, if necessary.  . Regular physical exercise is one of the most important things you can do for  your health. Most adults should get at least 150 minutes of moderate-intensity exercise (any activity that increases your heart rate and causes you to sweat) each week. In addition, most adults need muscle-strengthening exercises on 2 or more days a week.  Silver Sneakers may be a benefit available to you. To determine eligibility, you may visit the website: www.silversneakers.com or contact program at (581)800-6287 Mon-Fri between 8AM-8PM.   . Maintain a healthy weight. The body mass index (BMI) is a screening tool to identify possible weight problems. It provides an estimate of body fat based on height and weight. Your health care provider can find your BMI and can help you achieve or maintain a healthy weight.   For adults 20 years and older: ? A BMI below 18.5 is considered underweight. ? A BMI of 18.5 to 24.9 is normal. ? A BMI of 25 to 29.9 is considered overweight. ? A BMI of 30 and above is considered obese.   . Maintain normal blood lipids and cholesterol levels by exercising and minimizing your intake of saturated fat. Eat a balanced diet with plenty of fruit and vegetables. Blood tests for lipids and cholesterol should begin at age 47 and be repeated every 5 years. If your lipid or cholesterol levels are high, you are over 50, or you are at high risk for heart disease, you may need your cholesterol levels checked  more frequently. Ongoing high lipid and cholesterol levels should be treated with medicines if diet and exercise are not working.  . If you smoke, find out from your health care provider how to quit. If you do not use tobacco, please do not start.  . If you choose to drink alcohol, please do not consume more than 2 drinks per day. One drink is considered to be 12 ounces (355 mL) of beer, 5 ounces (148 mL) of wine, or 1.5 ounces (44 mL) of liquor.  . If you are 64-66 years old, ask your health care provider if you should take aspirin to prevent strokes.  . Use sunscreen.  Apply sunscreen liberally and repeatedly throughout the day. You should seek shade when your shadow is shorter than you. Protect yourself by wearing long sleeves, pants, a wide-brimmed hat, and sunglasses year round, whenever you are outdoors.  . Once a month, do a whole body skin exam, using a mirror to look at the skin on your back. Tell your health care provider of new moles, moles that have irregular borders, moles that are larger than a pencil eraser, or moles that have changed in shape or color.

## 2016-07-21 NOTE — Progress Notes (Signed)
I reviewed health advisor's note, was available for consultation, and agree with documentation and plan.   Signed,  Mallori Araque T. Paola Aleshire, MD  

## 2016-07-22 LAB — TSH: TSH: 1.53 u[IU]/mL (ref 0.35–4.50)

## 2016-07-22 LAB — CBC WITH DIFFERENTIAL/PLATELET
Basophils Absolute: 0 10*3/uL (ref 0.0–0.1)
Basophils Relative: 0.4 % (ref 0.0–3.0)
Eosinophils Absolute: 0.2 10*3/uL (ref 0.0–0.7)
Eosinophils Relative: 1.9 % (ref 0.0–5.0)
HEMATOCRIT: 32.2 % — AB (ref 36.0–46.0)
Hemoglobin: 10.6 g/dL — ABNORMAL LOW (ref 12.0–15.0)
Lymphs Abs: 0.7 10*3/uL (ref 0.7–4.0)
MCHC: 33.1 g/dL (ref 30.0–36.0)
MCV: 93.1 fl (ref 78.0–100.0)
MONOS PCT: 9 % (ref 3.0–12.0)
Monocytes Absolute: 0.9 10*3/uL (ref 0.1–1.0)
NEUTROS ABS: 8.6 10*3/uL — AB (ref 1.4–7.7)
Neutrophils Relative %: 81.9 % — ABNORMAL HIGH (ref 43.0–77.0)
PLATELETS: 266 10*3/uL (ref 150.0–400.0)
RBC: 3.45 Mil/uL — ABNORMAL LOW (ref 3.87–5.11)
RDW: 14.6 % (ref 11.5–15.5)
WBC: 10.6 10*3/uL — ABNORMAL HIGH (ref 4.0–10.5)

## 2016-07-22 LAB — COMPREHENSIVE METABOLIC PANEL
ALT: 6 U/L (ref 0–35)
AST: 11 U/L (ref 0–37)
Albumin: 3.7 g/dL (ref 3.5–5.2)
Alkaline Phosphatase: 96 U/L (ref 39–117)
BILIRUBIN TOTAL: 0.6 mg/dL (ref 0.2–1.2)
BUN: 33 mg/dL — ABNORMAL HIGH (ref 6–23)
CALCIUM: 9.4 mg/dL (ref 8.4–10.5)
CO2: 28 meq/L (ref 19–32)
Chloride: 106 mEq/L (ref 96–112)
Creatinine, Ser: 1.54 mg/dL — ABNORMAL HIGH (ref 0.40–1.20)
GFR: 34.14 mL/min — AB (ref >60.00)
Glucose, Bld: 99 mg/dL (ref 70–99)
Potassium: 4.9 mEq/L (ref 3.5–5.1)
Sodium: 144 mEq/L (ref 135–145)
Total Protein: 6.2 g/dL (ref 6.0–8.3)

## 2016-07-22 LAB — LIPID PANEL
CHOL/HDL RATIO: 4
Cholesterol: 136 mg/dL (ref 0–200)
HDL: 34.7 mg/dL — AB (ref >39.00)
LDL Cholesterol: 71 mg/dL (ref 0–99)
NonHDL: 101.57
TRIGLYCERIDES: 154 mg/dL — AB (ref 0.0–149.0)
VLDL: 30.8 mg/dL (ref 0.0–40.0)

## 2016-07-22 NOTE — Telephone Encounter (Signed)
Opened in error

## 2016-07-27 ENCOUNTER — Other Ambulatory Visit: Payer: Self-pay

## 2016-07-29 ENCOUNTER — Ambulatory Visit: Payer: Self-pay | Admitting: Family Medicine

## 2016-08-05 ENCOUNTER — Ambulatory Visit: Payer: Self-pay | Admitting: Family Medicine

## 2016-08-09 ENCOUNTER — Ambulatory Visit (INDEPENDENT_AMBULATORY_CARE_PROVIDER_SITE_OTHER): Payer: Medicare Other | Admitting: Family Medicine

## 2016-08-09 VITALS — BP 140/68 | HR 74 | Ht 64.0 in | Wt 161.0 lb

## 2016-08-09 DIAGNOSIS — G301 Alzheimer's disease with late onset: Secondary | ICD-10-CM

## 2016-08-09 DIAGNOSIS — E079 Disorder of thyroid, unspecified: Secondary | ICD-10-CM | POA: Diagnosis not present

## 2016-08-09 DIAGNOSIS — Z23 Encounter for immunization: Secondary | ICD-10-CM | POA: Diagnosis not present

## 2016-08-09 DIAGNOSIS — I6523 Occlusion and stenosis of bilateral carotid arteries: Secondary | ICD-10-CM | POA: Diagnosis not present

## 2016-08-09 DIAGNOSIS — F32A Depression, unspecified: Secondary | ICD-10-CM

## 2016-08-09 DIAGNOSIS — E785 Hyperlipidemia, unspecified: Secondary | ICD-10-CM

## 2016-08-09 DIAGNOSIS — J440 Chronic obstructive pulmonary disease with acute lower respiratory infection: Secondary | ICD-10-CM

## 2016-08-09 DIAGNOSIS — F0280 Dementia in other diseases classified elsewhere without behavioral disturbance: Secondary | ICD-10-CM | POA: Diagnosis not present

## 2016-08-09 DIAGNOSIS — I5032 Chronic diastolic (congestive) heart failure: Secondary | ICD-10-CM

## 2016-08-09 DIAGNOSIS — F329 Major depressive disorder, single episode, unspecified: Secondary | ICD-10-CM | POA: Diagnosis not present

## 2016-08-09 DIAGNOSIS — I10 Essential (primary) hypertension: Secondary | ICD-10-CM

## 2016-08-09 MED ORDER — PNEUMOCOCCAL 13-VAL CONJ VACC IM SUSP: 0.5000 mL | INTRAMUSCULAR | Status: DC

## 2016-08-09 NOTE — Assessment & Plan Note (Signed)
Progressive. Without behavioral disturbances.  She seems to be doing well at [facility]. No changes made to rxs.

## 2016-08-09 NOTE — Assessment & Plan Note (Signed)
Well controlled. No changes made to rxs today. 

## 2016-08-09 NOTE — Assessment & Plan Note (Signed)
Has been doing very well from a respiratory standpoint since moving from [facility] to [facility].  No COPD exacerbation ER visits. No changes made to rxs.

## 2016-08-09 NOTE — Progress Notes (Signed)
Subjective:   Patient ID: Joy [name], female    DOB: 1930-08-21, 83 y.o.   MRN: [id number]  Joy [name] is a pleasant 83 y.o. year old female who presents to clinic today with Follow-up  on 08/09/2016  HPI:  Annual medicare wellness visit with Candis Musa, RN on 07/21/16. Note reviewed.  Concerns were as follows:   PCP notes:   Health maintenance: Tetanus-due, daughter will check w/ insurance regarding coverage and may get vaccine at pharmacy.   Prevnar-13- due, patient has a cough today, so will defer.    Abnormal screenings:  Hearing-failed.   Cognitive-failed.  Falls- >2 in the last year.   Patient concerns:  1) ? Pink eye of R eye-itching, red, and swollen for a few weeks. Also has a small scab like sore on R side of nose that is itchy.   2) Patient reports she now has constant hoarseness. This used to be intermittent. She also has a strong non-productive congested cough x1 week.   Per pt, this has improved.  3) She is having some constipation. She does not currently take any stool softeners of laxatives. Her PO fluid intake is poor.   Per pt, this has improved. 4) Weight loss- she is down 11 lbs since last visit.    Wt Readings from Last 3 Encounters:  07/21/16 155 lb (70.3 kg)  05/28/16 166 lb 12 oz (75.6 kg)  04/13/16 162 lb 5 oz (73.6 kg)   Improved today- family is working with her on leaving her room for meals.  Nurse concerns: BP borderline low, however patient is fasting and reports poor PO fluid intake Improved at [facility] and here today.  HLD- compliant with zocor 40 mg daily. Lab Results  Component Value Date   CHOL 136 07/21/2016   HDL 34.70 (L) 07/21/2016   LDLCALC 71 07/21/2016   LDLDIRECT 99.0 01/02/2015   TRIG 154.0 (H) 07/21/2016   CHOLHDL 4 07/21/2016   Lab Results  Component Value Date   ALT 6 07/21/2016   AST 11 07/21/2016   ALKPHOS 96 07/21/2016   BILITOT 0.6 07/21/2016   Hypothyroidism- has  been euthyroid on low dose synthroid, 25 mcg daily. Lab Results  Component Value Date   TSH 1.53 07/21/2016   Dementia- has progressed.  She is now in [place] at [facility].  She remains on Namenda and marinol for weight loss. Also has been taking zoloft and wellbutrin for years for depression.  No recent COPD hospitalizations.   Current Outpatient Prescriptions on File Prior to Visit  Medication Sig Dispense Refill  . acetaminophen (TYLENOL) 650 MG CR tablet Take 650 mg by mouth every 8 (eight) hours as needed for pain.    Marland Kitchen ADVAIR DISKUS 250-50 MCG/DOSE AEPB INHALE 1 PUFF BY MOUTH TWICE A DAY 60 each 0  . albuterol (PROVENTIL HFA;VENTOLIN HFA) 108 (90 Base) MCG/ACT inhaler Inhale 2 puffs into the lungs every 6 (six) hours as needed for wheezing or shortness of breath. 1 Inhaler 0  . albuterol (PROVENTIL) (2.5 MG/3ML) 0.083% nebulizer solution Take 3 mLs (2.5 mg total) by nebulization every 6 (six) hours as needed for wheezing or shortness of breath. 150 mL 1  . amLODipine (NORVASC) 5 MG tablet Take 1 tablet (5 mg total) by mouth daily. 30 tablet 5  . atorvastatin (LIPITOR) 40 MG tablet TAKE 1 TABLET BY MOUTH EVERY DAY (Patient taking differently: TAKE 0.5  TABLET BY MOUTH EVERY DAY) 30 tablet 1  . buPROPion (WELLBUTRIN XL)  150 MG 24 hr tablet Take 150 mg by mouth daily.    . clopidogrel (PLAVIX) 75 MG tablet Take 1 tablet (75 mg total) by mouth daily. 90 tablet 1  . dronabinol (MARINOL) 2.5 MG capsule Take 1 capsule (2.5 mg total) by mouth 2 (two) times daily before lunch and supper. 60 capsule 1  . levothyroxine (SYNTHROID, LEVOTHROID) 25 MCG tablet Take 25 mcg by mouth daily before breakfast.    . lisinopril (PRINIVIL,ZESTRIL) 10 MG tablet Take 1 tablet (10 mg total) by mouth daily. 30 tablet 5  . memantine (NAMENDA) 10 MG tablet Take 10 mg by mouth 2 (two) times daily.    . nitroGLYCERIN (NITROSTAT) 0.4 MG SL tablet Place 0.4 mg under the tongue every 5 (five) minutes as needed.       . ondansetron (ZOFRAN) 4 MG tablet Take 1 tablet (4 mg total) by mouth every 8 (eight) hours as needed for nausea or vomiting. 30 tablet 0  . pantoprazole (PROTONIX) 40 MG tablet Take 40 mg by mouth daily.    . sertraline (ZOLOFT) 100 MG tablet TAKE 1 TABLET BY MOUTH EVERY DAY (Patient taking differently: TAKE 1 1/2 TABLET BY MOUTH EVERY DAY) 30 tablet 1  . SPIRIVA HANDIHALER 18 MCG inhalation capsule PLACE 1 CAPSULE (18 MCG TOTAL) INTO INHALER AND INHALE DAILY. 30 capsule 10  . vitamin B-12 (CYANOCOBALAMIN) 1000 MCG tablet Take 1,000 mcg by mouth daily.     No current facility-administered medications on file prior to visit.     Allergies  Allergen Reactions  . Codeine Other (See Comments)    Coma for 2 days  . Prednisone Other (See Comments)    Weight gain,puffy face  . Shellfish Allergy Other (See Comments)    Unknown    Past Medical History:  Diagnosis Date  . Alzheimer disease   . Anxiety   . CAD (coronary artery disease)    h/o NSTEMI, LAD stent placement 10/04, cath 05/11/2010  . Carotid artery occlusion   . COPD (chronic obstructive pulmonary disease) ([place])   . Depression   . Diastolic dysfunction    Echo 05/11/2010, EF 65-70%  . HLD (hyperlipidemia)   . HTN (hypertension)   . Hx of MELAS    MELAS CARRIER  . Hypertension   . Kidney infection   . PMR (polymyalgia rheumatica) (HCC)    with h/o steroid induced myopathy  . Thyroid disease   . TIA (transient ischemic attack)    11/05 s/p L CEA    Past Surgical History:  Procedure Laterality Date  . CARDIAC CATHETERIZATION     unc  . CARDIAC CATHETERIZATION    . CARDIAC CATHETERIZATION    . CAROTID ENDARTERECTOMY     left with stent placement 11/2003  . ILIAC ARTERY STENT     right, 2001  . LUNG BIOPSY      Family History  Problem Relation Age of Onset  . Arthritis Mother   . Arthritis Father   . Glaucoma Father   . Alzheimer's disease Brother   . Heart disease Brother   . Arthritis Brother   . Heart  disease Brother   . Arthritis Brother   . Glaucoma Brother     Social History   Social History  . Marital status: Married    Spouse name: N/A  . Number of children: N/A  . Years of education: N/A   Occupational History  . retired     Pharmacist, hospital   Social History Main Topics  .  Smoking status: Former Smoker    Packs/day: 1.00    Years: 25.00    Types: Cigarettes    Quit date: 01/05/1996  . Smokeless tobacco: Never Used  . Alcohol use No  . Drug use: No  . Sexual activity: No   Other Topics Concern  . Not on file   Social History Narrative   Lives with husband, [name] at [place].   Daughters, [name] and [name] very involved.   The PMH, PSH, Social History, Family History, Medications, and allergies have been reviewed in [facility], and have been updated if relevant.  Review of Systems  Constitutional: Positive for appetite change.  HENT: Negative.   Eyes: Negative.   Respiratory: Negative.   Cardiovascular: Negative.   Gastrointestinal: Negative.   Endocrine: Negative.   Genitourinary: Negative.   Musculoskeletal: Negative.   Allergic/Immunologic: Negative.   Neurological: Negative.   Psychiatric/Behavioral: Negative.  Negative for behavioral problems and dysphoric mood. The patient is not nervous/anxious.   All other systems reviewed and are negative.      Objective:    BP 140/68   Pulse 74   Ht 5\' 4"  (1.626 m)   Wt 161 lb (73 kg)   SpO2 94%   BMI 27.64 kg/m   Wt Readings from Last 3 Encounters:  08/09/16 161 lb (73 kg)  07/21/16 155 lb (70.3 kg)  05/28/16 166 lb 12 oz (75.6 kg)    Physical Exam  Constitutional: She is oriented to person, place, and time.  Does appear thinner but pleasant and talkative today O2 [place] in place  HENT:  Head: Normocephalic and atraumatic.  Eyes: Conjunctivae are normal.  Cardiovascular: Normal rate and regular rhythm.   Pulmonary/Chest: Effort normal and breath sounds normal.  Musculoskeletal: Normal range of motion. She  exhibits no edema.  Neurological: She is alert and oriented to person, place, and time. No cranial nerve deficit. Coordination normal.  Skin: Skin is warm.  Psychiatric: She has a normal mood and affect. Her behavior is normal. Judgment and thought content normal.  Nursing note and vitals reviewed.         Assessment & Plan:   Essential hypertension  Chronic diastolic heart failure (HCC)  Chronic obstructive pulmonary disease with acute lower respiratory infection ([place])  Late onset Alzheimer's disease without behavioral disturbance  Thyroid disease  Hyperlipidemia, unspecified hyperlipidemia type  Need for pneumococcal vaccination - Plan: Pneumococcal conjugate vaccine 13-valent, DISCONTINUED: pneumococcal 13-valent conjugate vaccine (PREVNAR 13) injection 0.5 mL No Follow-up on file.

## 2016-08-09 NOTE — Assessment & Plan Note (Signed)
At goal on current dose of statin.  No changes made today.

## 2016-08-09 NOTE — Assessment & Plan Note (Signed)
Euthyroid on current rxs. No changes made today.

## 2016-09-03 DIAGNOSIS — R05 Cough: Secondary | ICD-10-CM | POA: Diagnosis not present

## 2016-09-04 ENCOUNTER — Emergency Department
Admission: EM | Admit: 2016-09-04 | Discharge: 2016-09-04 | Disposition: A | Discharge status: Skilled Nursing Facility | Payer: Medicare Other | Attending: Emergency Medicine | Admitting: Emergency Medicine

## 2016-09-04 ENCOUNTER — Emergency Department: Payer: Medicare Other

## 2016-09-04 ENCOUNTER — Encounter: Payer: Self-pay | Admitting: Emergency Medicine

## 2016-09-04 DIAGNOSIS — N183 Chronic kidney disease, stage 3 (moderate): Secondary | ICD-10-CM | POA: Diagnosis not present

## 2016-09-04 DIAGNOSIS — I2510 Atherosclerotic heart disease of native coronary artery without angina pectoris: Secondary | ICD-10-CM | POA: Insufficient documentation

## 2016-09-04 DIAGNOSIS — Z87891 Personal history of nicotine dependence: Secondary | ICD-10-CM | POA: Insufficient documentation

## 2016-09-04 DIAGNOSIS — J449 Chronic obstructive pulmonary disease, unspecified: Secondary | ICD-10-CM | POA: Diagnosis not present

## 2016-09-04 DIAGNOSIS — Z955 Presence of coronary angioplasty implant and graft: Secondary | ICD-10-CM | POA: Insufficient documentation

## 2016-09-04 DIAGNOSIS — R300 Dysuria: Secondary | ICD-10-CM | POA: Diagnosis present

## 2016-09-04 DIAGNOSIS — N390 Urinary tract infection, site not specified: Secondary | ICD-10-CM | POA: Insufficient documentation

## 2016-09-04 DIAGNOSIS — I130 Hypertensive heart and chronic kidney disease with heart failure and stage 1 through stage 4 chronic kidney disease, or unspecified chronic kidney disease: Secondary | ICD-10-CM | POA: Diagnosis not present

## 2016-09-04 DIAGNOSIS — R109 Unspecified abdominal pain: Secondary | ICD-10-CM | POA: Diagnosis not present

## 2016-09-04 DIAGNOSIS — G309 Alzheimer's disease, unspecified: Secondary | ICD-10-CM | POA: Insufficient documentation

## 2016-09-04 DIAGNOSIS — E860 Dehydration: Secondary | ICD-10-CM | POA: Diagnosis not present

## 2016-09-04 DIAGNOSIS — I5032 Chronic diastolic (congestive) heart failure: Secondary | ICD-10-CM | POA: Insufficient documentation

## 2016-09-04 DIAGNOSIS — Z79899 Other long term (current) drug therapy: Secondary | ICD-10-CM | POA: Diagnosis not present

## 2016-09-04 DIAGNOSIS — R531 Weakness: Secondary | ICD-10-CM | POA: Diagnosis not present

## 2016-09-04 LAB — COMPREHENSIVE METABOLIC PANEL
ALT: 8 U/L — ABNORMAL LOW (ref 14–54)
ANION GAP: 7 (ref 5–15)
AST: 18 U/L (ref 15–41)
Albumin: 3.1 g/dL — ABNORMAL LOW (ref 3.5–5.0)
Alkaline Phosphatase: 95 U/L (ref 38–126)
BUN: 23 mg/dL — ABNORMAL HIGH (ref 6–20)
CO2: 26 mmol/L (ref 22–32)
Calcium: 8.5 mg/dL — ABNORMAL LOW (ref 8.9–10.3)
Chloride: 108 mmol/L (ref 101–111)
Creatinine, Ser: 1.48 mg/dL — ABNORMAL HIGH (ref 0.44–1.00)
GFR, EST AFRICAN AMERICAN: 37 mL/min — AB (ref >60)
GFR, EST NON AFRICAN AMERICAN: 32 mL/min — AB (ref >60)
Glucose, Bld: 127 mg/dL — ABNORMAL HIGH (ref 65–99)
POTASSIUM: 3.9 mmol/L (ref 3.5–5.1)
Sodium: 141 mmol/L (ref 135–145)
Total Bilirubin: 0.7 mg/dL (ref 0.3–1.2)
Total Protein: 5.9 g/dL — ABNORMAL LOW (ref 6.5–8.1)

## 2016-09-04 LAB — URINALYSIS, COMPLETE (UACMP) WITH MICROSCOPIC
BILIRUBIN URINE: NEGATIVE
Glucose, UA: NEGATIVE mg/dL
HGB URINE DIPSTICK: NEGATIVE
Ketones, ur: NEGATIVE mg/dL
NITRITE: POSITIVE — AB
PH: 5 (ref 5.0–8.0)
Protein, ur: 30 mg/dL — AB
Specific Gravity, Urine: 1.02 (ref 1.005–1.030)

## 2016-09-04 LAB — CBC
HCT: 27.1 % — ABNORMAL LOW (ref 35.0–47.0)
Hemoglobin: 9.2 g/dL — ABNORMAL LOW (ref 12.0–16.0)
MCH: 30 pg (ref 26.0–34.0)
MCHC: 33.9 g/dL (ref 32.0–36.0)
MCV: 88.6 fL (ref 80.0–100.0)
PLATELETS: 211 10*3/uL (ref 150–440)
RBC: 3.07 MIL/uL — ABNORMAL LOW (ref 3.80–5.20)
RDW: 15.1 % — AB (ref 11.5–14.5)
WBC: 7 10*3/uL (ref 3.6–11.0)

## 2016-09-04 MED ORDER — CEPHALEXIN 250 MG PO CAPS
250.0000 mg | ORAL_CAPSULE | Freq: Three times a day (TID) | ORAL | 0 refills | Status: AC
Start: 2016-09-04 — End: 2016-09-14

## 2016-09-04 MED ORDER — DEXTROSE 5 % IV SOLN
1.0000 g | Freq: Once | INTRAVENOUS | Status: AC
  Administered 2016-09-04: 1 g via INTRAVENOUS
  Filled 2016-09-04: qty 10

## 2016-09-04 NOTE — ED Triage Notes (Signed)
Sent in by daughter - per ems original call was flank pain. Then when they arrived the report was pt hasn't been eating well x a few weeks and was weaker. Pt states "i feel like I have a uti"

## 2016-09-04 NOTE — ED Provider Notes (Signed)
[facility] Emergency Department Provider Note  Time seen: 3:30 PM  I have reviewed the triage vital signs and the nursing notes.   HISTORY  Chief Complaint Anorexia    HPI Joy [name] is a 83 y.o. female Who presents to the emergency department for dysuria and left flank pain. According to the patient for the past 3 days she has been experiencing pain in her left flank radiating to her left back along with a burning sensation when she urinates. Denies any blood in her urine. Denies any nausea or vomiting or diarrhea. She also states for the past several days she has been feeling more weak than normal. Denies any fever. Describes the pain as mild dull type pain.  Past Medical History:  Diagnosis Date  . Alzheimer disease   . Anxiety   . CAD (coronary artery disease)    h/o NSTEMI, LAD stent placement 10/04, cath 05/11/2010  . Carotid artery occlusion   . COPD (chronic obstructive pulmonary disease) ([place])   . Depression   . Diastolic dysfunction    Echo 05/11/2010, EF 65-70%  . HLD (hyperlipidemia)   . HTN (hypertension)   . Hx of MELAS    MELAS CARRIER  . Hypertension   . Kidney infection   . PMR (polymyalgia rheumatica) (HCC)    with h/o steroid induced myopathy  . Thyroid disease   . TIA (transient ischemic attack)    11/05 s/p L CEA    Patient Active Problem List   Diagnosis Date Noted  . Alzheimer's dementia without behavioral disturbance   . Palliative care by specialist   . DNR (do not resuscitate)   . Goals of care, counseling/discussion   . Gabapentin-induced toxicity 04/04/2016  . Allergic rhinitis 11/04/2014  . Chronic diastolic heart failure ([place]) 07/22/2014  . CKD (chronic kidney disease) stage 3, GFR 30-59 ml/min 06/11/2014  . Carotid artery disease ([place]) 04/08/2014  . CAD (coronary artery disease)   . HLD (hyperlipidemia)   . Lung nodule 01/15/2013  . Weakness generalized 12/26/2012  . Incontinence 12/26/2012  . GERD  (gastroesophageal reflux disease) 09/06/2012  . Neuropathy 01/14/2011  . Insomnia 01/14/2011  . MELAS (mitochondrial encephalopathy, lactic acidosis and stroke-like episodes) ([place]) 11/02/2010  . HTN (hypertension)   . COPD (chronic obstructive pulmonary disease) ([place])   . Depression   . Thyroid disease     Past Surgical History:  Procedure Laterality Date  . CARDIAC CATHETERIZATION     unc  . CARDIAC CATHETERIZATION    . CARDIAC CATHETERIZATION    . CAROTID ENDARTERECTOMY     left with stent placement 11/2003  . ILIAC ARTERY STENT     right, 2001  . LUNG BIOPSY      Prior to Admission medications   Medication Sig Start Date End Date Taking? Authorizing Provider  acetaminophen (TYLENOL) 650 MG CR tablet Take 650 mg by mouth every 8 (eight) hours as needed for pain.    [provider]  ADVAIR DISKUS 250-50 MCG/DOSE AEPB INHALE 1 PUFF BY MOUTH TWICE A DAY 09/29/15   Lucille Passy, MD  albuterol (PROVENTIL HFA;VENTOLIN HFA) 108 (90 Base) MCG/ACT inhaler Inhale 2 puffs into the lungs every 6 (six) hours as needed for wheezing or shortness of breath. 04/17/15   Joanne Gavel, MD  albuterol (PROVENTIL) (2.5 MG/3ML) 0.083% nebulizer solution Take 3 mLs (2.5 mg total) by nebulization every 6 (six) hours as needed for wheezing or shortness of breath. 04/28/15   Lucille Passy,  MD  amLODipine (NORVASC) 5 MG tablet Take 1 tablet (5 mg total) by mouth daily. 07/15/15   Wellington Hampshire, MD  atorvastatin (LIPITOR) 40 MG tablet TAKE 1 TABLET BY MOUTH EVERY DAY Patient taking differently: TAKE 0.5  TABLET BY MOUTH EVERY DAY 08/14/15   Lucille Passy, MD  buPROPion (WELLBUTRIN XL) 150 MG 24 hr tablet Take 150 mg by mouth daily.    [provider]  clopidogrel (PLAVIX) 75 MG tablet Take 1 tablet (75 mg total) by mouth daily. 05/22/15   Lucille Passy, MD  dronabinol (MARINOL) 2.5 MG capsule Take 1 capsule (2.5 mg total) by mouth 2 (two) times daily before lunch and supper. 04/13/16   Lucille Passy, MD  levothyroxine (SYNTHROID, LEVOTHROID) 25 MCG tablet Take 25 mcg by mouth daily before breakfast.    [provider]  lisinopril (PRINIVIL,ZESTRIL) 10 MG tablet Take 1 tablet (10 mg total) by mouth daily. 07/15/15   Wellington Hampshire, MD  memantine (NAMENDA) 10 MG tablet Take 10 mg by mouth 2 (two) times daily.    [provider]  nitroGLYCERIN (NITROSTAT) 0.4 MG SL tablet Place 0.4 mg under the tongue every 5 (five) minutes as needed.      [provider]  ondansetron (ZOFRAN) 4 MG tablet Take 1 tablet (4 mg total) by mouth every 8 (eight) hours as needed for nausea or vomiting. 04/13/16   Lucille Passy, MD  pantoprazole (PROTONIX) 40 MG tablet Take 40 mg by mouth daily.    [provider]  polyethylene glycol (MIRALAX / GLYCOLAX) packet Take 17 g by mouth daily.    [provider]  sertraline (ZOLOFT) 100 MG tablet TAKE 1 TABLET BY MOUTH EVERY DAY Patient taking differently: TAKE 1 1/2 TABLET BY MOUTH EVERY DAY 10/03/15   Lucille Passy, MD  SPIRIVA HANDIHALER 18 MCG inhalation capsule PLACE 1 CAPSULE (18 MCG TOTAL) INTO INHALER AND INHALE DAILY. 06/03/15   Lucille Passy, MD  traZODone (DESYREL) 50 MG tablet Take 50 mg by mouth at bedtime. /12 tab po    [provider]  vitamin B-12 (CYANOCOBALAMIN) 1000 MCG tablet Take 1,000 mcg by mouth daily.    [provider]    Allergies  Allergen Reactions  . Codeine Other (See Comments)    Coma for 2 days  . Prednisone Other (See Comments)    Weight gain,puffy face  . Shellfish Allergy Other (See Comments)    Unknown    Family History  Problem Relation Age of Onset  . Arthritis Mother   . Arthritis Father   . Glaucoma Father   . Alzheimer's disease Brother   . Heart disease Brother   . Arthritis Brother   . Heart disease Brother   . Arthritis Brother   . Glaucoma Brother     Social History Social History  Substance Use Topics  . Smoking status: Former Smoker     Packs/day: 1.00    Years: 25.00    Types: Cigarettes    Quit date: 01/05/1996  . Smokeless tobacco: Never Used  . Alcohol use No    Review of Systems Constitutional: Negative for fever. Cardiovascular: Negative for chest pain. Respiratory: Negative for shortness of breath. Gastrointestinal: left flank pain. Genitourinary: mild dysuria Musculoskeletal: mild left back pain Skin: Negative for rash. Neurological: Negative for headache All other ROS negative  ____________________________________________   PHYSICAL EXAM:  VITAL SIGNS: ED Triage Vitals  Enc Vitals Group  BP 09/04/16 1501 (!) 108/38     Pulse Rate 09/04/16 1504 70     Resp 09/04/16 1504 18     Temp 09/04/16 1504 97.9 F (36.6 C)     Temp Source 09/04/16 1504 Oral     SpO2 09/04/16 1504 97 %     Weight 09/04/16 1505 160 lb 0.9 oz (72.6 kg)     Height 09/04/16 1505 5\' 3"  (1.6 m)     Head Circumference --      Peak Flow --      Pain Score --      Pain Loc --      Pain Edu? --      Excl. in [place]? --     Constitutional: Alert and oriented. Well appearing and in no distress. Eyes: Normal exam ENT   Head: Normocephalic and atraumatic.   Mouth/Throat: Mucous membranes are moist. Cardiovascular: Normal rate, regular rhythm. No murmur Respiratory: Normal respiratory effort without tachypnea nor retractions. Breath sounds are clear  Gastrointestinal: soft, slight left lower quadrant tenderness palpation. No rebound or guarding. No distention. Musculoskeletal: Nontender with normal range of motion in all extremities Neurologic:  Normal speech and language. No gross focal neurologic deficits Skin:  Skin is warm, dry and intact.  Psychiatric: Mood and affect are normal.    RADIOLOGY  IMPRESSION: 1. No hydronephrosis. No urolithiasis. 2. No evidence of bowel obstruction or acute bowel inflammation. Normal appendix. 3. Mild splenomegaly, which appears new. No abdominopelvic lymphadenopathy. 4. Aortic  Atherosclerosis (ICD10-I70.0) and Emphysema (ICD10-J43.9). 5. Coronary atherosclerosis.  ____________________________________________   INITIAL IMPRESSION / ASSESSMENT AND PLAN / ED COURSE  Pertinent labs & imaging results that were available during my care of the patient were reviewed by me and considered in my medical decision making (see chart for details).  patient presents to the emergency department for left flank pain and dysuria for the past 3 days. Patient does have mild left lower quadrant tenderness to palpation. We will check labs, CT renal scan, and closely monitor. Overall the patient appears well, no distress with reassuring vitals. Patient does wear 3 L of oxygen 24/7 currently 97% on such.  patient's urinalysis consistent with urinary tract infection. Labs otherwise unrevealing. CT negative. Patient has received 1 g of IV Rocephin. We will discharge home with Keflex. Urine culture has been sent. Patient agreeable to plan.  ____________________________________________   FINAL CLINICAL IMPRESSION(S) / ED DIAGNOSES  left flank pain Dysuria urinary tract infection   Harvest Dark, MD 09/04/16 [place] [place]

## 2016-10-06 DIAGNOSIS — J9611 Chronic respiratory failure with hypoxia: Secondary | ICD-10-CM | POA: Diagnosis not present

## 2016-10-06 DIAGNOSIS — J431 Panlobular emphysema: Secondary | ICD-10-CM | POA: Diagnosis not present

## 2016-10-06 DIAGNOSIS — J449 Chronic obstructive pulmonary disease, unspecified: Secondary | ICD-10-CM | POA: Diagnosis not present

## 2016-10-27 DIAGNOSIS — R0602 Shortness of breath: Secondary | ICD-10-CM | POA: Diagnosis not present

## 2016-12-08 ENCOUNTER — Other Ambulatory Visit: Payer: Self-pay

## 2016-12-08 MED ORDER — CIPROFLOXACIN HCL 250 MG PO TABS: 250.0000 mg | ORAL_TABLET | Freq: Two times a day (BID) | ORAL | 0 refills | Status: AC

## 2016-12-08 NOTE — Telephone Encounter (Signed)
TA received urine C&A which indicates UTI/I spoke with daughter who verified that the allergies in the chart are current/She is aware that Cipro 250mg  1bid #10 for 5d is being faxed to [facility]/I have also faxed a note to [facility] to [place] in [facility] @ [phone] indicated all of the above information and stated that this Abx MUST be given with food and if there are any intolerance issues to the Abx to plz let us know/thx dmf

## 2016-12-09 ENCOUNTER — Other Ambulatory Visit: Payer: Self-pay | Admitting: Family Medicine

## 2017-04-07 ENCOUNTER — Ambulatory Visit (INDEPENDENT_AMBULATORY_CARE_PROVIDER_SITE_OTHER): Payer: Medicare Other | Admitting: Internal Medicine

## 2017-04-07 ENCOUNTER — Encounter: Payer: Self-pay | Admitting: Internal Medicine

## 2017-04-07 VITALS — BP 132/68 | HR 60 | Resp 16 | Ht 64.0 in | Wt 156.0 lb

## 2017-04-07 DIAGNOSIS — J449 Chronic obstructive pulmonary disease, unspecified: Secondary | ICD-10-CM

## 2017-04-07 DIAGNOSIS — I2510 Atherosclerotic heart disease of native coronary artery without angina pectoris: Secondary | ICD-10-CM | POA: Diagnosis not present

## 2017-04-07 DIAGNOSIS — I5032 Chronic diastolic (congestive) heart failure: Secondary | ICD-10-CM | POA: Diagnosis not present

## 2017-04-07 DIAGNOSIS — I2583 Coronary atherosclerosis due to lipid rich plaque: Secondary | ICD-10-CM | POA: Diagnosis not present

## 2017-04-07 DIAGNOSIS — N183 Chronic kidney disease, stage 3 unspecified: Secondary | ICD-10-CM

## 2017-04-07 NOTE — Patient Instructions (Signed)

## 2017-04-07 NOTE — Progress Notes (Signed)
[facility] [address], [place] [address]  Pulmonary Sleep Medicine   Office Visit Note  Patient Name: Joy [name] DOB: 1930-05-08 MRN [id number]  Date of Service: 04/07/2017  Complaints/HPI:  She has been doing fairly well has had no admissions to the hospital.  She continues to use her oxygen as prescribed.  She continues with her current medications as prescribed.  Currently no chest pain.  She is on 1.5 L oxygen with issues noted.  Daughter states that she has good long-term memory short-term memory is a little bit less than previous  ROS  General: (-) fever, (-) chills, (-) night sweats, (-) weakness Skin: (-) rashes, (-) itching,. Eyes: (-) visual changes, (-) redness, (-) itching. Nose and Sinuses: (-) nasal stuffiness or itchiness, (-) postnasal drip, (-) nosebleeds, (-) sinus trouble. Mouth and Throat: (-) sore throat, (-) hoarseness. Neck: (-) swollen glands, (-) enlarged thyroid, (-) neck pain. Respiratory: - cough, (-) bloody sputum, + shortness of breath, - wheezing. Cardiovascular: - ankle swelling, (-) chest pain. Lymphatic: (-) lymph node enlargement. Neurologic: (-) numbness, (-) tingling. Psychiatric: (-) anxiety, (-) depression   Current Medication: Outpatient Encounter Medications as of 04/07/2017  Medication Sig  . acetaminophen (TYLENOL) 650 MG CR tablet Take 650 mg by mouth every 8 (eight) hours as needed for pain.  Marland Kitchen ADVAIR DISKUS 250-50 MCG/DOSE AEPB INHALE 1 PUFF BY MOUTH TWICE A DAY  . albuterol (PROVENTIL HFA;VENTOLIN HFA) 108 (90 Base) MCG/ACT inhaler Inhale 2 puffs into the lungs every 6 (six) hours as needed for wheezing or shortness of breath.  Marland Kitchen albuterol (PROVENTIL) (2.5 MG/3ML) 0.083% nebulizer solution Take 3 mLs (2.5 mg total) by nebulization every 6 (six) hours as needed for wheezing or shortness of breath.  Marland Kitchen amLODipine (NORVASC) 10 MG tablet Take 10 mg by mouth daily.  Marland Kitchen atorvastatin (LIPITOR) 20 MG tablet Take 20  mg by mouth daily.  Marland Kitchen buPROPion (WELLBUTRIN SR) 150 MG 12 hr tablet Take 150 mg by mouth 2 (two) times daily.  Marland Kitchen buPROPion (WELLBUTRIN XL) 150 MG 24 hr tablet Take 150 mg by mouth daily.  . clopidogrel (PLAVIX) 75 MG tablet Take 1 tablet (75 mg total) by mouth daily.  Marland Kitchen lisinopril (PRINIVIL,ZESTRIL) 10 MG tablet Take 1 tablet (10 mg total) by mouth daily.  . sertraline (ZOLOFT) 100 MG tablet TAKE 1 TABLET BY MOUTH EVERY DAY (Patient taking differently: TAKE 1 1/2 TABLET BY MOUTH EVERY DAY)  . SPIRIVA HANDIHALER 18 MCG inhalation capsule PLACE 1 CAPSULE (18 MCG TOTAL) INTO INHALER AND INHALE DAILY.  . traZODone (DESYREL) 50 MG tablet Take 25-50 mg by mouth at bedtime as needed for sleep.   . vitamin B-12 (CYANOCOBALAMIN) 1000 MCG tablet Take 1,000 mcg by mouth daily.  . [DISCONTINUED] amLODipine (NORVASC) 5 MG tablet Take 1 tablet (5 mg total) by mouth daily.  Marland Kitchen dronabinol (MARINOL) 2.5 MG capsule Take 1 capsule (2.5 mg total) by mouth 2 (two) times daily before lunch and supper.  Marland Kitchen ipratropium-albuterol (DUONEB) 0.5-2.5 (3) MG/3ML SOLN Take 3 mLs by nebulization every 4 (four) hours as needed (Wheezing/Dyspnea).  Marland Kitchen levothyroxine (SYNTHROID, LEVOTHROID) 25 MCG tablet Take 25 mcg by mouth daily before breakfast.  . memantine (NAMENDA) 10 MG tablet Take 10 mg by mouth 2 (two) times daily.  . nitroGLYCERIN (NITROSTAT) 0.4 MG SL tablet Place 0.4 mg under the tongue every 5 (five) minutes as needed.    . ondansetron (ZOFRAN) 4 MG tablet Take 1 tablet (4 mg total) by  mouth every 8 (eight) hours as needed for nausea or vomiting.  . pantoprazole (PROTONIX) 40 MG tablet Take 40 mg by mouth daily.  . polyethylene glycol (MIRALAX / GLYCOLAX) packet Take 17 g by mouth daily.  . [DISCONTINUED] atorvastatin (LIPITOR) 40 MG tablet TAKE 1 TABLET BY MOUTH EVERY DAY (Patient not taking: Reported on 04/07/2017)   No facility-administered encounter medications on file as of 04/07/2017.     Surgical History: Past  Surgical History:  Procedure Laterality Date  . CARDIAC CATHETERIZATION     unc  . CARDIAC CATHETERIZATION    . CARDIAC CATHETERIZATION    . CAROTID ENDARTERECTOMY     left with stent placement 11/2003  . ILIAC ARTERY STENT     right, 2001  . LUNG BIOPSY      Medical History: Past Medical History:  Diagnosis Date  . Alzheimer disease   . Anxiety   . CAD (coronary artery disease)    h/o NSTEMI, LAD stent placement 10/04, cath 05/11/2010  . Carotid artery occlusion   . COPD (chronic obstructive pulmonary disease) ([place])   . Depression   . Diastolic dysfunction    Echo 05/11/2010, EF 65-70%  . HLD (hyperlipidemia)   . HTN (hypertension)   . Hx of MELAS    MELAS CARRIER  . Hypertension   . Kidney infection   . PMR (polymyalgia rheumatica) (HCC)    with h/o steroid induced myopathy  . Thyroid disease   . TIA (transient ischemic attack)    11/05 s/p L CEA    Family History: Family History  Problem Relation Age of Onset  . Arthritis Mother   . Arthritis Father   . Glaucoma Father   . Alzheimer's disease Brother   . Heart disease Brother   . Arthritis Brother   . Heart disease Brother   . Arthritis Brother   . Glaucoma Brother     Social History: Social History   Socioeconomic History  . Marital status: Married    Spouse name: Not on file  . Number of children: Not on file  . Years of education: Not on file  . Highest education level: Not on file  Occupational History  . Occupation: retired    Comment: Pharmacist, hospital  Social Needs  . Financial resource strain: Not on file  . Food insecurity:    Worry: Not on file    Inability: Not on file  . Transportation needs:    Medical: Not on file    Non-medical: Not on file  Tobacco Use  . Smoking status: Former Smoker    Packs/day: 1.00    Years: 25.00    Pack years: 25.00    Types: Cigarettes    Last attempt to quit: 01/05/1996    Years since quitting: 21.2  . Smokeless tobacco: Never Used  Substance and Sexual  Activity  . Alcohol use: No  . Drug use: No  . Sexual activity: Never  Lifestyle  . Physical activity:    Days per week: Not on file    Minutes per session: Not on file  . Stress: Not on file  Relationships  . Social connections:    Talks on phone: Not on file    Gets together: Not on file    Attends religious service: Not on file    Active member of club or organization: Not on file    Attends meetings of clubs or organizations: Not on file    Relationship status: Not on file  . Intimate  partner violence:    Fear of current or ex partner: Not on file    Emotionally abused: Not on file    Physically abused: Not on file    Forced sexual activity: Not on file  Other Topics Concern  . Not on file  Social History Narrative   Lives with husband, [name] at [place].   Daughters, [name] and [name] very involved.    Vital Signs: Blood pressure 132/68, pulse 60, resp. rate 16, height 5\' 4"  (1.626 m), weight 156 lb (70.8 kg), SpO2 96 %.  Examination: General Appearance: The patient is well-developed, well-nourished, and in no distress. Skin: Gross inspection of skin unremarkable. Head: normocephalic, no gross deformities. Eyes: no gross deformities noted. ENT: ears appear grossly normal no exudates. Neck: Supple. No thyromegaly. No LAD. Respiratory: no rhonchi. Cardiovascular: Normal S1 and S2 without murmur or rub. Extremities: No cyanosis. pulses are equal. Neurologic: Alert and oriented. No involuntary movements.  LABS: No results found for this or any previous visit (from the past 2160 hour(s)).  Radiology: Ct Renal Stone Study  Result Date: 09/04/2016 CLINICAL DATA:  Flank pain.  Clinical concern for nephrolithiasis. EXAM: CT ABDOMEN AND PELVIS WITHOUT CONTRAST TECHNIQUE: Multidetector CT imaging of the abdomen and pelvis was performed following the standard protocol without IV contrast. COMPARISON:  08/19/2012 CT abdomen/pelvis. FINDINGS: Lower chest: Emphysema. Scattered  parenchymal bands and subpleural reticulation at the lung bases is not well characterized on these motion degraded images and is not definitely changed since 08/08/2015 chest CT. Coronary atherosclerosis. Hepatobiliary: Normal liver with no liver mass. Cholecystectomy. Bile ducts are stable and within normal post cholecystectomy limits with common bile duct diameter 5 mm. Small periampullary duodenal diverticulum. Pancreas: Normal, with no mass or duct dilation. Spleen: Mild splenomegaly (craniocaudal splenic length 13.7 cm), which appears new. Hypodense 1.0 cm splenic lesion (series 2/ image 20) is decreased from 2.0 cm on 08/19/2012, compatible with a benign lesion. No new splenic lesions. Adrenals/Urinary Tract: Normal adrenals. No hydronephrosis. No renal stones. Stable vascular calcification in the anterior left renal sinus. Simple 1.3 cm lateral upper right renal cyst. Simple 1.0 cm lateral upper left renal cyst. No additional contour deforming renal lesions. Normal caliber ureters, with no ureteral stones. Normal bladder. Stomach/Bowel: Grossly normal stomach. Normal caliber small bowel with no small bowel wall thickening. Normal appendix . Normal large bowel with no diverticulosis, large bowel wall thickening or pericolonic fat stranding. Vascular/Lymphatic: Atherosclerotic nonaneurysmal abdominal aorta. No pathologically enlarged lymph nodes in the abdomen or pelvis. Reproductive: Stable coarsely calcified subcentimeter right fundal uterine fibroid. No adnexal masses. Other: No pneumoperitoneum, ascites or focal fluid collection. Musculoskeletal: No aggressive appearing focal osseous lesions. Moderate thoracolumbar spondylosis. IMPRESSION: 1. No hydronephrosis.  No urolithiasis. 2. No evidence of bowel obstruction or acute bowel inflammation. Normal appendix. 3. Mild splenomegaly, which appears new. No abdominopelvic lymphadenopathy. 4. Aortic Atherosclerosis (ICD10-I70.0) and Emphysema (ICD10-J43.9). 5.  Coronary atherosclerosis. Electronically Signed   By: Ilona Sorrel M.D.   On: 09/04/2016 16:31    No results found.  No results found.    Assessment and Plan: Patient Active Problem List   Diagnosis Date Noted  . Alzheimer's dementia without behavioral disturbance   . Palliative care by specialist   . DNR (do not resuscitate)   . Goals of care, counseling/discussion   . Gabapentin-induced toxicity 04/04/2016  . Allergic rhinitis 11/04/2014  . Chronic diastolic heart failure ([place]) 07/22/2014  . CKD (chronic kidney disease) stage 3, GFR 30-59 ml/min (HCC) 06/11/2014  .  Carotid artery disease ([place]) 04/08/2014  . CAD (coronary artery disease)   . HLD (hyperlipidemia)   . Lung nodule 01/15/2013  . Weakness generalized 12/26/2012  . Incontinence 12/26/2012  . GERD (gastroesophageal reflux disease) 09/06/2012  . Neuropathy 01/14/2011  . Insomnia 01/14/2011  . MELAS (mitochondrial encephalopathy, lactic acidosis and stroke-like episodes) ([place]) 11/02/2010  . HTN (hypertension)   . COPD (chronic obstructive pulmonary disease) ([place])   . Depression   . Thyroid disease     1. COPD we will continue with current management continue with oxygen therapy as ordered and supportive care  2. Chronic respiratory failure with hypoxia patient be continued on oxygen therapy as already noted above 3. CAD stable at this time no active chest pain 4. CKD III follow labs as necessary with primary care 5. Chronic diastolic failure currently compensated will continue to monitor  General Counseling: I have discussed the findings of the evaluation and examination with Izora Gala.  I have also discussed any further diagnostic evaluation thatmay be needed or ordered today. Ivyanna verbalizes understanding of the findings of todays visit. We also reviewed her medications today and discussed drug interactions and side effects including but not limited excessive drowsiness and altered mental states. We also discussed  that there is always a risk not just to her but also people around her. she has been encouraged to call the office with any questions or concerns that should arise related to todays visit.    Time spent: 8min  I have personally obtained a history, examined the patient, evaluated laboratory and imaging results, formulated the assessment and plan and placed orders.    Allyne Gee, MD [facility] Pulmonary and [facility] Care Sleep medicine

## 2017-04-21 ENCOUNTER — Ambulatory Visit: Payer: Medicare Other | Admitting: Family Medicine

## 2017-04-21 ENCOUNTER — Ambulatory Visit (INDEPENDENT_AMBULATORY_CARE_PROVIDER_SITE_OTHER): Payer: Medicare Other

## 2017-04-21 ENCOUNTER — Ambulatory Visit (INDEPENDENT_AMBULATORY_CARE_PROVIDER_SITE_OTHER): Payer: Medicare Other | Admitting: Family Medicine

## 2017-04-21 ENCOUNTER — Encounter: Payer: Self-pay | Admitting: Family Medicine

## 2017-04-21 VITALS — BP 118/54 | HR 61 | Temp 98.8°F | Wt 157.0 lb

## 2017-04-21 DIAGNOSIS — S20211A Contusion of right front wall of thorax, initial encounter: Secondary | ICD-10-CM | POA: Insufficient documentation

## 2017-04-21 DIAGNOSIS — R0902 Hypoxemia: Secondary | ICD-10-CM | POA: Diagnosis not present

## 2017-04-21 DIAGNOSIS — S299XXA Unspecified injury of thorax, initial encounter: Secondary | ICD-10-CM | POA: Diagnosis not present

## 2017-04-21 DIAGNOSIS — R0781 Pleurodynia: Secondary | ICD-10-CM | POA: Diagnosis not present

## 2017-04-21 MED ORDER — TRAMADOL HCL 50 MG PO TABS
50.0000 mg | ORAL_TABLET | Freq: Two times a day (BID) | ORAL | 0 refills | Status: DC | PRN
Start: 2017-04-21 — End: 2019-06-18

## 2017-04-21 NOTE — Patient Instructions (Signed)
Chest Contusion, Adult  A chest contusion is a deep bruise to the chest. Contusions are usually the result of a blunt injury to tissues under the skin. The injury can damage the small blood vessels under the skin, which causes bleeding under the skin. The skin overlying the contusion may turn blue, purple, or yellow. Minor injuries may give you a painless contusion, but more severe contusions may stay painful and swollen for a few weeks.  What are the causes?  A contusion is usually caused by a hard hit (blow), trauma, or direct force to your chest, such as:   A motor vehicle accident.   Falls.   Bicycle injuries.   Contact sport injuries.    What increases the risk?  You may be at a higher risk for a chest contusion if you play a sport in which falls and contact are common, such as football or soccer.  What are the signs or symptoms?  Symptoms of this condition include:   Chest swelling.   Pain and tenderness of the chest.   Discomfort with certain movements of the upper torso.   Discoloration of the chest. The area may have redness and then turn blue, purple, or yellow.   Discomfort when taking deep breaths.    How is this diagnosed?  A chest contusion is diagnosed from a physical exam and your medical history. An X-ray may be needed to determine if there were any associated injuries, such as broken bones (fractures). Sometimes other tests such as CT scans, ultrasounds, or MRIs may be needed if internal injuries are suspected.  A test that shows the amount of oxygen in your blood (pulse oximetry) may be done if you have trouble breathing.  How is this treated?  Often, the best treatment for a chest contusion is resting and applying ice to the injured area. Deep-breathing exercises may be recommended to reduce the risk of pneumonia. Oxygen therapy may be given if you have trouble breathing or have low oxygen levels. Over-the-counter medicines may also be recommended for pain control.  Follow these  instructions at home:   If directed, apply ice to the injured area.  ? Put ice in a plastic bag.  ? Place a towel between your skin and the bag.  ? Leave the ice on for 20 minutes, 2-3 times per day.   Take over-the-counter and prescription medicines only as told by your health care provider.   Do any deep-breathing exercises as told by your health care provider, if this applies.   Do not lie down flat on your back. Keep your head and chest raised (elevated) when you are resting or sleeping.   Do not use any products that contain nicotine or tobacco, such as cigarettes and e-cigarettes. If you need help quitting, ask your health care provider.   Do not lift anything that causes you discomfort or pain.  Contact a health care provider if:   Your swelling or pain is not relieved with medicines or treatment.   You have increased bruising or swelling.   You have pain that is getting worse.   Your symptoms have not improved after one week.  Get help right away if:   You have a sudden, significant increase in pain.   You have difficulty breathing.   You have dizziness, weakness, or fainting.   You have blood in your urine or stool.   You cough up blood or you vomit blood.  Summary   A chest contusion is   a deep bruise to the chest that is usually caused by a hard hit, trauma, or direct force to your chest.   Treatment for a chest contusion may include resting and applying ice to the injured area.   Contact a health care provider if you have problems breathing or if your pain does not improve with treatment.  This information is not intended to replace advice given to you by your health care provider. Make sure you discuss any questions you have with your health care provider.  Document Released: 09/15/2000 Document Revised: 09/18/2015 Document Reviewed: 09/18/2015  [employer]vier Interactive Patient Education  2018 [employer]

## 2017-04-21 NOTE — Progress Notes (Signed)
Subjective:  Patient ID: Joy [name], female    DOB: [birth year]  Age: 84 y.o. MRN: [id number]  CC: Fall (Patient is here today after a fall.  DOI: 4.17.19.  She fell getting into bed hitting the right side of her back on the bed rail.  Cannot really use right side.  Voices that pain is a 9 when she tries to move and hurts to take a deep breath.  Daughter states that she had to drive easier because of the patient's pain.  There is a bruise on mid right back.)   HPI Joy [name] presents for evaluation of an injury to her chest wall status post falling last night in assisted living.  She says that this happened when she was attempting to move from her bedside chair and go to bed.  As far she can remember she lost her footing and slipped and fell.  She denies a spinning sensation or lightheadedness.  She has not been sick recently with a cough or fever.  She had not taken her trazodone prior to this event.  She has a history of COPD and is of normal he is on 1.5 L of oxygen.  PO2 on around 94.  She is on 3 PO2 at 92.  Listed codeine as a drug allergy.  She had overdosed on codeine in the past and does not have a true allergy to it.  Outpatient Medications Prior to Visit  Medication Sig Dispense Refill  . acetaminophen (TYLENOL) 650 MG CR tablet Take 650 mg by mouth every 8 (eight) hours as needed for pain.    Marland Kitchen ADVAIR DISKUS 250-50 MCG/DOSE AEPB INHALE 1 PUFF BY MOUTH TWICE A DAY 60 each 0  . albuterol (PROVENTIL HFA;VENTOLIN HFA) 108 (90 Base) MCG/ACT inhaler Inhale 2 puffs into the lungs every 6 (six) hours as needed for wheezing or shortness of breath. 1 Inhaler 0  . amLODipine (NORVASC) 10 MG tablet Take 10 mg by mouth daily.    Marland Kitchen atorvastatin (LIPITOR) 20 MG tablet Take 20 mg by mouth daily.    Marland Kitchen buPROPion (WELLBUTRIN XL) 150 MG 24 hr tablet Take 150 mg by mouth daily.    . clopidogrel (PLAVIX) 75 MG tablet Take 1 tablet (75 mg total) by mouth daily. 90 tablet 1  .  ipratropium-albuterol (DUONEB) 0.5-2.5 (3) MG/3ML SOLN Take 3 mLs by nebulization every 4 (four) hours as needed (Wheezing/Dyspnea).    Marland Kitchen levothyroxine (SYNTHROID, LEVOTHROID) 25 MCG tablet Take 25 mcg by mouth daily before breakfast.    . lisinopril (PRINIVIL,ZESTRIL) 10 MG tablet Take 1 tablet (10 mg total) by mouth daily. 30 tablet 5  . memantine (NAMENDA) 10 MG tablet Take 10 mg by mouth 2 (two) times daily.    . nitroGLYCERIN (NITROSTAT) 0.4 MG SL tablet Place 0.4 mg under the tongue every 5 (five) minutes as needed.      . pantoprazole (PROTONIX) 40 MG tablet Take 40 mg by mouth daily.    . polyethylene glycol (MIRALAX / GLYCOLAX) packet Take 17 g by mouth daily.    . sertraline (ZOLOFT) 100 MG tablet TAKE 1 TABLET BY MOUTH EVERY DAY (Patient taking differently: TAKE 1 1/2 TABLET BY MOUTH EVERY DAY) 30 tablet 1  . SPIRIVA HANDIHALER 18 MCG inhalation capsule PLACE 1 CAPSULE (18 MCG TOTAL) INTO INHALER AND INHALE DAILY. 30 capsule 10  . traZODone (DESYREL) 50 MG tablet Take 25-50 mg by mouth at bedtime as needed for sleep.     . vitamin B-12 (  CYANOCOBALAMIN) 1000 MCG tablet Take 1,000 mcg by mouth daily.    Marland Kitchen albuterol (PROVENTIL) (2.5 MG/3ML) 0.083% nebulizer solution Take 3 mLs (2.5 mg total) by nebulization every 6 (six) hours as needed for wheezing or shortness of breath. 150 mL 1  . buPROPion (WELLBUTRIN SR) 150 MG 12 hr tablet Take 150 mg by mouth 2 (two) times daily.    Marland Kitchen dronabinol (MARINOL) 2.5 MG capsule Take 1 capsule (2.5 mg total) by mouth 2 (two) times daily before lunch and supper. 60 capsule 1  . ondansetron (ZOFRAN) 4 MG tablet Take 1 tablet (4 mg total) by mouth every 8 (eight) hours as needed for nausea or vomiting. 30 tablet 0   No facility-administered medications prior to visit.     ROS Review of Systems  Constitutional: Negative for chills, fever and unexpected weight change.  HENT: Negative.   Respiratory: Negative for cough and wheezing.   Cardiovascular:  Positive for chest pain. Negative for palpitations and leg swelling.  Gastrointestinal: Negative.   Neurological: Negative for dizziness and light-headedness.  Hematological: Bruises/bleeds easily.  Psychiatric/Behavioral: Negative.     Objective:  BP (!) 118/54   Pulse 61   Temp 98.8 F (37.1 C) (Oral)   Wt 157 lb (71.2 kg)   SpO2 92% Comment: On 3L of O2  BMI 26.95 kg/m   BP Readings from Last 3 Encounters:  04/21/17 (!) 118/54  04/07/17 132/68  09/04/16 (!) 112/59    Wt Readings from Last 3 Encounters:  04/21/17 157 lb (71.2 kg)  04/07/17 156 lb (70.8 kg)  09/04/16 160 lb 0.9 oz (72.6 kg)    Physical Exam  Constitutional: She appears well-developed and well-nourished. No distress.  HENT:  Head: Normocephalic and atraumatic.  Right Ear: External ear normal.  Left Ear: External ear normal.  Eyes: Right eye exhibits no discharge. Left eye exhibits no discharge. Right conjunctiva is not injected. Right conjunctiva has no hemorrhage. Left conjunctiva is not injected. Left conjunctiva has no hemorrhage. Right pupil is round and reactive. Left pupil is round and reactive. Pupils are equal.  Neck: Trachea normal. No JVD present. No thyromegaly present.  Cardiovascular: Normal rate and regular rhythm.  Pulmonary/Chest: No accessory muscle usage. No tachypnea. No respiratory distress. She has no decreased breath sounds. She has no wheezes. She has rhonchi in the right lower field.  Skin: She is not diaphoretic.    Lab Results  Component Value Date   WBC 7.0 09/04/2016   HGB 9.2 (L) 09/04/2016   HCT 27.1 (L) 09/04/2016   PLT 211 09/04/2016   GLUCOSE 127 (H) 09/04/2016   CHOL 136 07/21/2016   TRIG 154.0 (H) 07/21/2016   HDL 34.70 (L) 07/21/2016   LDLDIRECT 99.0 01/02/2015   LDLCALC 71 07/21/2016   ALT 8 (L) 09/04/2016   AST 18 09/04/2016   NA 141 09/04/2016   K 3.9 09/04/2016   CL 108 09/04/2016   CREATININE 1.48 (H) 09/04/2016   BUN 23 (H) 09/04/2016   CO2 26  09/04/2016   TSH 1.53 07/21/2016   HGBA1C 5.3 08/20/2012    Ct Renal Stone Study  Result Date: 09/04/2016 CLINICAL DATA:  Flank pain.  Clinical concern for nephrolithiasis. EXAM: CT ABDOMEN AND PELVIS WITHOUT CONTRAST TECHNIQUE: Multidetector CT imaging of the abdomen and pelvis was performed following the standard protocol without IV contrast. COMPARISON:  08/19/2012 CT abdomen/pelvis. FINDINGS: Lower chest: Emphysema. Scattered parenchymal bands and subpleural reticulation at the lung bases is not well characterized on these motion degraded  images and is not definitely changed since 08/08/2015 chest CT. Coronary atherosclerosis. Hepatobiliary: Normal liver with no liver mass. Cholecystectomy. Bile ducts are stable and within normal post cholecystectomy limits with common bile duct diameter 5 mm. Small periampullary duodenal diverticulum. Pancreas: Normal, with no mass or duct dilation. Spleen: Mild splenomegaly (craniocaudal splenic length 13.7 cm), which appears new. Hypodense 1.0 cm splenic lesion (series 2/ image 20) is decreased from 2.0 cm on 08/19/2012, compatible with a benign lesion. No new splenic lesions. Adrenals/Urinary Tract: Normal adrenals. No hydronephrosis. No renal stones. Stable vascular calcification in the anterior left renal sinus. Simple 1.3 cm lateral upper right renal cyst. Simple 1.0 cm lateral upper left renal cyst. No additional contour deforming renal lesions. Normal caliber ureters, with no ureteral stones. Normal bladder. Stomach/Bowel: Grossly normal stomach. Normal caliber small bowel with no small bowel wall thickening. Normal appendix . Normal large bowel with no diverticulosis, large bowel wall thickening or pericolonic fat stranding. Vascular/Lymphatic: Atherosclerotic nonaneurysmal abdominal aorta. No pathologically enlarged lymph nodes in the abdomen or pelvis. Reproductive: Stable coarsely calcified subcentimeter right fundal uterine fibroid. No adnexal masses. Other:  No pneumoperitoneum, ascites or focal fluid collection. Musculoskeletal: No aggressive appearing focal osseous lesions. Moderate thoracolumbar spondylosis. IMPRESSION: 1. No hydronephrosis.  No urolithiasis. 2. No evidence of bowel obstruction or acute bowel inflammation. Normal appendix. 3. Mild splenomegaly, which appears new. No abdominopelvic lymphadenopathy. 4. Aortic Atherosclerosis (ICD10-I70.0) and Emphysema (ICD10-J43.9). 5. Coronary atherosclerosis. Electronically Signed   By: Ilona Sorrel M.D.   On: 09/04/2016 16:31    Assessment & Plan:   Myrla was seen today for fall.  Diagnoses and all orders for this visit:  Chest wall contusion, right, initial encounter -     DG Ribs Unilateral Right; Future -     DG Ribs Unilateral Right -     traMADol (ULTRAM) 50 MG tablet; Take 1 tablet (50 mg total) by mouth every 12 (twelve) hours as needed.  Hypoxia   I have discontinued Izora Gala Torelli's dronabinol and ondansetron. I am also having her start on traMADol. Additionally, I am having her maintain her nitroGLYCERIN, vitamin B-12, levothyroxine, albuterol, clopidogrel, SPIRIVA HANDIHALER, lisinopril, ADVAIR DISKUS, sertraline, buPROPion, acetaminophen, memantine, pantoprazole, traZODone, polyethylene glycol, ipratropium-albuterol, amLODipine, and atorvastatin.  Meds ordered this encounter  Medications  . traMADol (ULTRAM) 50 MG tablet    Sig: Take 1 tablet (50 mg total) by mouth every 12 (twelve) hours as needed.    Dispense:  30 tablet    Refill:  0   Judicial use of Ultram for pain as needed.  Stressed the importance of a rib binder for support and pain relief.  Believe that her desaturation is due to splinting.  Advised to follow-up in 1 week if not improving.  Information on chest wall contusion was given.  Follow-up: Return in about 1 week (around 04/28/2017), or if symptoms worsen or fail to improve.  Libby Maw, MD

## 2017-05-10 ENCOUNTER — Ambulatory Visit (INDEPENDENT_AMBULATORY_CARE_PROVIDER_SITE_OTHER): Payer: Medicare Other | Admitting: Family Medicine

## 2017-05-10 ENCOUNTER — Encounter: Payer: Self-pay | Admitting: Family Medicine

## 2017-05-10 ENCOUNTER — Ambulatory Visit (INDEPENDENT_AMBULATORY_CARE_PROVIDER_SITE_OTHER): Payer: Medicare Other

## 2017-05-10 VITALS — BP 138/58 | HR 53 | Temp 98.7°F | Ht 64.0 in | Wt 159.0 lb

## 2017-05-10 DIAGNOSIS — R109 Unspecified abdominal pain: Secondary | ICD-10-CM

## 2017-05-10 DIAGNOSIS — I2510 Atherosclerotic heart disease of native coronary artery without angina pectoris: Secondary | ICD-10-CM | POA: Diagnosis not present

## 2017-05-10 DIAGNOSIS — R0781 Pleurodynia: Secondary | ICD-10-CM | POA: Diagnosis not present

## 2017-05-10 DIAGNOSIS — I779 Disorder of arteries and arterioles, unspecified: Secondary | ICD-10-CM

## 2017-05-10 DIAGNOSIS — I2583 Coronary atherosclerosis due to lipid rich plaque: Secondary | ICD-10-CM

## 2017-05-10 DIAGNOSIS — K5900 Constipation, unspecified: Secondary | ICD-10-CM | POA: Diagnosis not present

## 2017-05-10 DIAGNOSIS — S299XXA Unspecified injury of thorax, initial encounter: Secondary | ICD-10-CM | POA: Diagnosis not present

## 2017-05-10 DIAGNOSIS — R0789 Other chest pain: Secondary | ICD-10-CM | POA: Diagnosis not present

## 2017-05-10 DIAGNOSIS — I739 Peripheral vascular disease, unspecified: Secondary | ICD-10-CM

## 2017-05-10 NOTE — Assessment & Plan Note (Signed)
Exam reassuring but given that she is a poor historian and at risk for SBO - sedentary, recent narcotic use, will order KUB to rule out SBO. No vomiting or other signs or symptoms at this time.

## 2017-05-10 NOTE — Assessment & Plan Note (Signed)
She likely did fracture a rib or has a deep contusion- both just not visible on xray.  Discussed with pt and her daughter.  We will repeat rib film today to look for calcifications and she was given a wide ACE wrap and discussed how to place the binder. Call or return to clinic prn if these symptoms worsen or fail to improve as anticipated. The patient indicates understanding of these issues and agrees with the plan.

## 2017-05-10 NOTE — Progress Notes (Signed)
Subjective:   Patient ID: Joy [name], female    DOB: 29-Aug-1930, 84 y.o.   MRN: [id number]  Joy [name] is a pleasant 84 y.o. year old female who presents to clinic today with Follow-up (Patient is here today for a F/U from the fall on 4.17.19. She saw Dr. Ethelene Hal on 4.18.19.  She feel while getting into bed and hit the right side of her back on the bedrail.  He gave her Tramadol 50mg  bid and advised to use a rib binder.  Daughter states did not get one.  Today she is still wincing in pain and lethargic.  She was constipated but that has since resolved.)  on 05/10/2017  HPI:  Right rib pain- sustained a fall on 04/20/17 and saw Dr. Ethelene Hal on 04/21/17 for rib pain.  Note reviewed. Rib film at that OV was ordered- no acute findings.  He advised Rib binder but she and her daughter weren't sure how to ask ALF for this or what that was.  Tramadol was also given which caused constipation.  She is still having quite a bit of right inferior rib pain.   Also, the nurses at [facility] told her daughter that her belly felt more distended the past few days and both Joy [name] and her daughter are unsure when she last had a BM.  RIGHT RIBS - 2 VIEW  COMPARISON:  04/05/2016  FINDINGS: No fracture or other bone lesions are seen involving the ribs. No pneumothorax. Chronic linear scarring or subsegmental atelectasis laterally at the right lung base. Aortic Atherosclerosis (ICD10-170.0). Coronary stent.  IMPRESSION: No displaced fracture or pneumothorax.    Current Outpatient Medications on File Prior to Visit  Medication Sig Dispense Refill  . acetaminophen (TYLENOL) 650 MG CR tablet Take 650 mg by mouth every 8 (eight) hours as needed for pain.    Marland Kitchen ADVAIR DISKUS 250-50 MCG/DOSE AEPB INHALE 1 PUFF BY MOUTH TWICE A DAY 60 each 0  . albuterol (PROVENTIL HFA;VENTOLIN HFA) 108 (90 Base) MCG/ACT inhaler Inhale 2 puffs into the lungs every 6 (six) hours as needed for wheezing or shortness of  breath. 1 Inhaler 0  . amLODipine (NORVASC) 10 MG tablet Take 10 mg by mouth daily.    Marland Kitchen atorvastatin (LIPITOR) 20 MG tablet Take 20 mg by mouth daily.    Marland Kitchen buPROPion (WELLBUTRIN XL) 150 MG 24 hr tablet Take 150 mg by mouth daily.    . clopidogrel (PLAVIX) 75 MG tablet Take 1 tablet (75 mg total) by mouth daily. 90 tablet 1  . ipratropium-albuterol (DUONEB) 0.5-2.5 (3) MG/3ML SOLN Take 3 mLs by nebulization every 4 (four) hours as needed (Wheezing/Dyspnea).    Marland Kitchen levothyroxine (SYNTHROID, LEVOTHROID) 25 MCG tablet Take 25 mcg by mouth daily before breakfast.    . lisinopril (PRINIVIL,ZESTRIL) 10 MG tablet Take 1 tablet (10 mg total) by mouth daily. 30 tablet 5  . memantine (NAMENDA) 10 MG tablet Take 10 mg by mouth 2 (two) times daily.    . nitroGLYCERIN (NITROSTAT) 0.4 MG SL tablet Place 0.4 mg under the tongue every 5 (five) minutes as needed.      . pantoprazole (PROTONIX) 40 MG tablet Take 40 mg by mouth daily.    . polyethylene glycol (MIRALAX / GLYCOLAX) packet Take 17 g by mouth daily.    . sertraline (ZOLOFT) 100 MG tablet TAKE 1 TABLET BY MOUTH EVERY DAY (Patient taking differently: TAKE 1 1/2 TABLET BY MOUTH EVERY DAY) 30 tablet 1  . SPIRIVA HANDIHALER 18 MCG  inhalation capsule PLACE 1 CAPSULE (18 MCG TOTAL) INTO INHALER AND INHALE DAILY. 30 capsule 10  . traMADol (ULTRAM) 50 MG tablet Take 1 tablet (50 mg total) by mouth every 12 (twelve) hours as needed. 30 tablet 0  . traZODone (DESYREL) 50 MG tablet Take 25-50 mg by mouth at bedtime as needed for sleep.     . vitamin B-12 (CYANOCOBALAMIN) 1000 MCG tablet Take 1,000 mcg by mouth daily.     No current facility-administered medications on file prior to visit.     Allergies  Allergen Reactions  . Prednisone Other (See Comments)    Weight gain,puffy face  . Shellfish Allergy Other (See Comments)    Unknown    Past Medical History:  Diagnosis Date  . Alzheimer disease   . Anxiety   . CAD (coronary artery disease)    h/o  NSTEMI, LAD stent placement 10/04, cath 05/11/2010  . Carotid artery occlusion   . COPD (chronic obstructive pulmonary disease) ([place])   . Depression   . Diastolic dysfunction    Echo 05/11/2010, EF 65-70%  . HLD (hyperlipidemia)   . HTN (hypertension)   . Hx of MELAS    MELAS CARRIER  . Hypertension   . Kidney infection   . PMR (polymyalgia rheumatica) (HCC)    with h/o steroid induced myopathy  . Thyroid disease   . TIA (transient ischemic attack)    11/05 s/p L CEA    Past Surgical History:  Procedure Laterality Date  . CARDIAC CATHETERIZATION     [facility]  . CARDIAC CATHETERIZATION    . CARDIAC CATHETERIZATION    . CAROTID ENDARTERECTOMY     left with stent placement 11/2003  . ILIAC ARTERY STENT     right, 2001  . LUNG BIOPSY      Family History  Problem Relation Age of Onset  . Arthritis Mother   . Arthritis Father   . Glaucoma Father   . Alzheimer's disease Brother   . Heart disease Brother   . Arthritis Brother   . Heart disease Brother   . Arthritis Brother   . Glaucoma Brother     Social History   Socioeconomic History  . Marital status: Married    Spouse name: Not on file  . Number of children: Not on file  . Years of education: Not on file  . Highest education level: Not on file  Occupational History  . Occupation: retired    Comment: Pharmacist, hospital  Social Needs  . Financial resource strain: Not on file  . Food insecurity:    Worry: Not on file    Inability: Not on file  . Transportation needs:    Medical: Not on file    Non-medical: Not on file  Tobacco Use  . Smoking status: Former Smoker    Packs/day: 1.00    Years: 25.00    Pack years: 25.00    Types: Cigarettes    Last attempt to quit: 01/05/1996    Years since quitting: 21.3  . Smokeless tobacco: Never Used  Substance and Sexual Activity  . Alcohol use: No  . Drug use: No  . Sexual activity: Never  Lifestyle  . Physical activity:    Days per week: Not on file    Minutes per session: Not  on file  . Stress: Not on file  Relationships  . Social connections:    Talks on phone: Not on file    Gets together: Not on file    Attends  religious service: Not on file    Active member of club or organization: Not on file    Attends meetings of clubs or organizations: Not on file    Relationship status: Not on file  . Intimate partner violence:    Fear of current or ex partner: Not on file    Emotionally abused: Not on file    Physically abused: Not on file    Forced sexual activity: Not on file  Other Topics Concern  . Not on file  Social History Narrative   Lives with husband, [name] at [facility].   Daughters, [name] and [name] very involved.   The PMH, PSH, Social History, Family History, Medications, and allergies have been reviewed in [facility], and have been updated if relevant.   Review of Systems  Constitutional: Negative.   Respiratory: Negative.   Cardiovascular: Negative.   Gastrointestinal: Positive for abdominal distention.  Musculoskeletal: Negative.        + right inferior rib cage remains tender to touch and painful with deep inspiration  Hematological: Negative.   Psychiatric/Behavioral: Negative.   All other systems reviewed and are negative.      Objective:    BP (!) 138/58 (BP Location: Left Arm, Patient Position: Sitting, Cuff Size: Normal)   Pulse (!) 53   Temp 98.7 F (37.1 C) (Oral)   Ht 5\' 4"  (1.626 m)   Wt 159 lb (72.1 kg)   SpO2 92% Comment: On 2 L of O2  BMI 27.29 kg/m    Physical Exam  Constitutional: She is oriented to person, place, and time. She appears well-developed and well-nourished. No distress.  HENT:  Head: Normocephalic and atraumatic.  Eyes: EOM are normal.  Neck: Normal range of motion.  Pulmonary/Chest: Effort normal and breath sounds normal.  +TTP over right inferior ribs  Abdominal: Soft. Bowel sounds are normal. She exhibits no distension. There is no tenderness. There is no guarding.  Musculoskeletal: Normal range  of motion.  Neurological: She is alert and oriented to person, place, and time. No cranial nerve deficit.  Skin: Skin is warm. She is not diaphoretic.  Psychiatric: She has a normal mood and affect. Her behavior is normal. Judgment and thought content normal.  Nursing note and vitals reviewed.         Assessment & Plan:   Rib pain - Plan: DG Ribs Unilateral Right, DG Ribs Unilateral Right  Abdominal pain, unspecified abdominal location - Plan: DG Abd 1 View, DG Abd 1 View  Bilateral carotid artery disease (HCC) - Plan: VAS US CAROTID No follow-ups on file.

## 2017-05-16 DIAGNOSIS — R296 Repeated falls: Secondary | ICD-10-CM | POA: Diagnosis not present

## 2017-05-16 DIAGNOSIS — M6281 Muscle weakness (generalized): Secondary | ICD-10-CM | POA: Diagnosis not present

## 2017-05-17 DIAGNOSIS — M6281 Muscle weakness (generalized): Secondary | ICD-10-CM | POA: Diagnosis not present

## 2017-05-17 DIAGNOSIS — R296 Repeated falls: Secondary | ICD-10-CM | POA: Diagnosis not present

## 2017-05-19 DIAGNOSIS — M6281 Muscle weakness (generalized): Secondary | ICD-10-CM | POA: Diagnosis not present

## 2017-05-19 DIAGNOSIS — R296 Repeated falls: Secondary | ICD-10-CM | POA: Diagnosis not present

## 2017-05-20 DIAGNOSIS — R296 Repeated falls: Secondary | ICD-10-CM | POA: Diagnosis not present

## 2017-05-20 DIAGNOSIS — M6281 Muscle weakness (generalized): Secondary | ICD-10-CM | POA: Diagnosis not present

## 2017-05-23 DIAGNOSIS — R296 Repeated falls: Secondary | ICD-10-CM | POA: Diagnosis not present

## 2017-05-23 DIAGNOSIS — M6281 Muscle weakness (generalized): Secondary | ICD-10-CM | POA: Diagnosis not present

## 2017-05-24 DIAGNOSIS — M6281 Muscle weakness (generalized): Secondary | ICD-10-CM | POA: Diagnosis not present

## 2017-05-24 DIAGNOSIS — R296 Repeated falls: Secondary | ICD-10-CM | POA: Diagnosis not present

## 2017-05-26 DIAGNOSIS — M6281 Muscle weakness (generalized): Secondary | ICD-10-CM | POA: Diagnosis not present

## 2017-05-26 DIAGNOSIS — R296 Repeated falls: Secondary | ICD-10-CM | POA: Diagnosis not present

## 2017-05-27 DIAGNOSIS — R296 Repeated falls: Secondary | ICD-10-CM | POA: Diagnosis not present

## 2017-05-27 DIAGNOSIS — M6281 Muscle weakness (generalized): Secondary | ICD-10-CM | POA: Diagnosis not present

## 2017-05-31 DIAGNOSIS — R296 Repeated falls: Secondary | ICD-10-CM | POA: Diagnosis not present

## 2017-05-31 DIAGNOSIS — M6281 Muscle weakness (generalized): Secondary | ICD-10-CM | POA: Diagnosis not present

## 2017-06-01 DIAGNOSIS — R296 Repeated falls: Secondary | ICD-10-CM | POA: Diagnosis not present

## 2017-06-01 DIAGNOSIS — M6281 Muscle weakness (generalized): Secondary | ICD-10-CM | POA: Diagnosis not present

## 2017-06-02 DIAGNOSIS — M6281 Muscle weakness (generalized): Secondary | ICD-10-CM | POA: Diagnosis not present

## 2017-06-02 DIAGNOSIS — R296 Repeated falls: Secondary | ICD-10-CM | POA: Diagnosis not present

## 2017-06-06 DIAGNOSIS — M6281 Muscle weakness (generalized): Secondary | ICD-10-CM | POA: Diagnosis not present

## 2017-06-06 DIAGNOSIS — R296 Repeated falls: Secondary | ICD-10-CM | POA: Diagnosis not present

## 2017-09-02 DIAGNOSIS — R1310 Dysphagia, unspecified: Secondary | ICD-10-CM | POA: Diagnosis not present

## 2017-09-02 DIAGNOSIS — R05 Cough: Secondary | ICD-10-CM | POA: Diagnosis not present

## 2017-10-02 IMAGING — DX DG CHEST 2V
3 series · 3 of 3 positions shown · non-contrast
Comparison: April 25, 2015.

CLINICAL DATA: Shortness of breath.

EXAM:
CHEST  2 VIEW

[chest pa (1 of 2)]
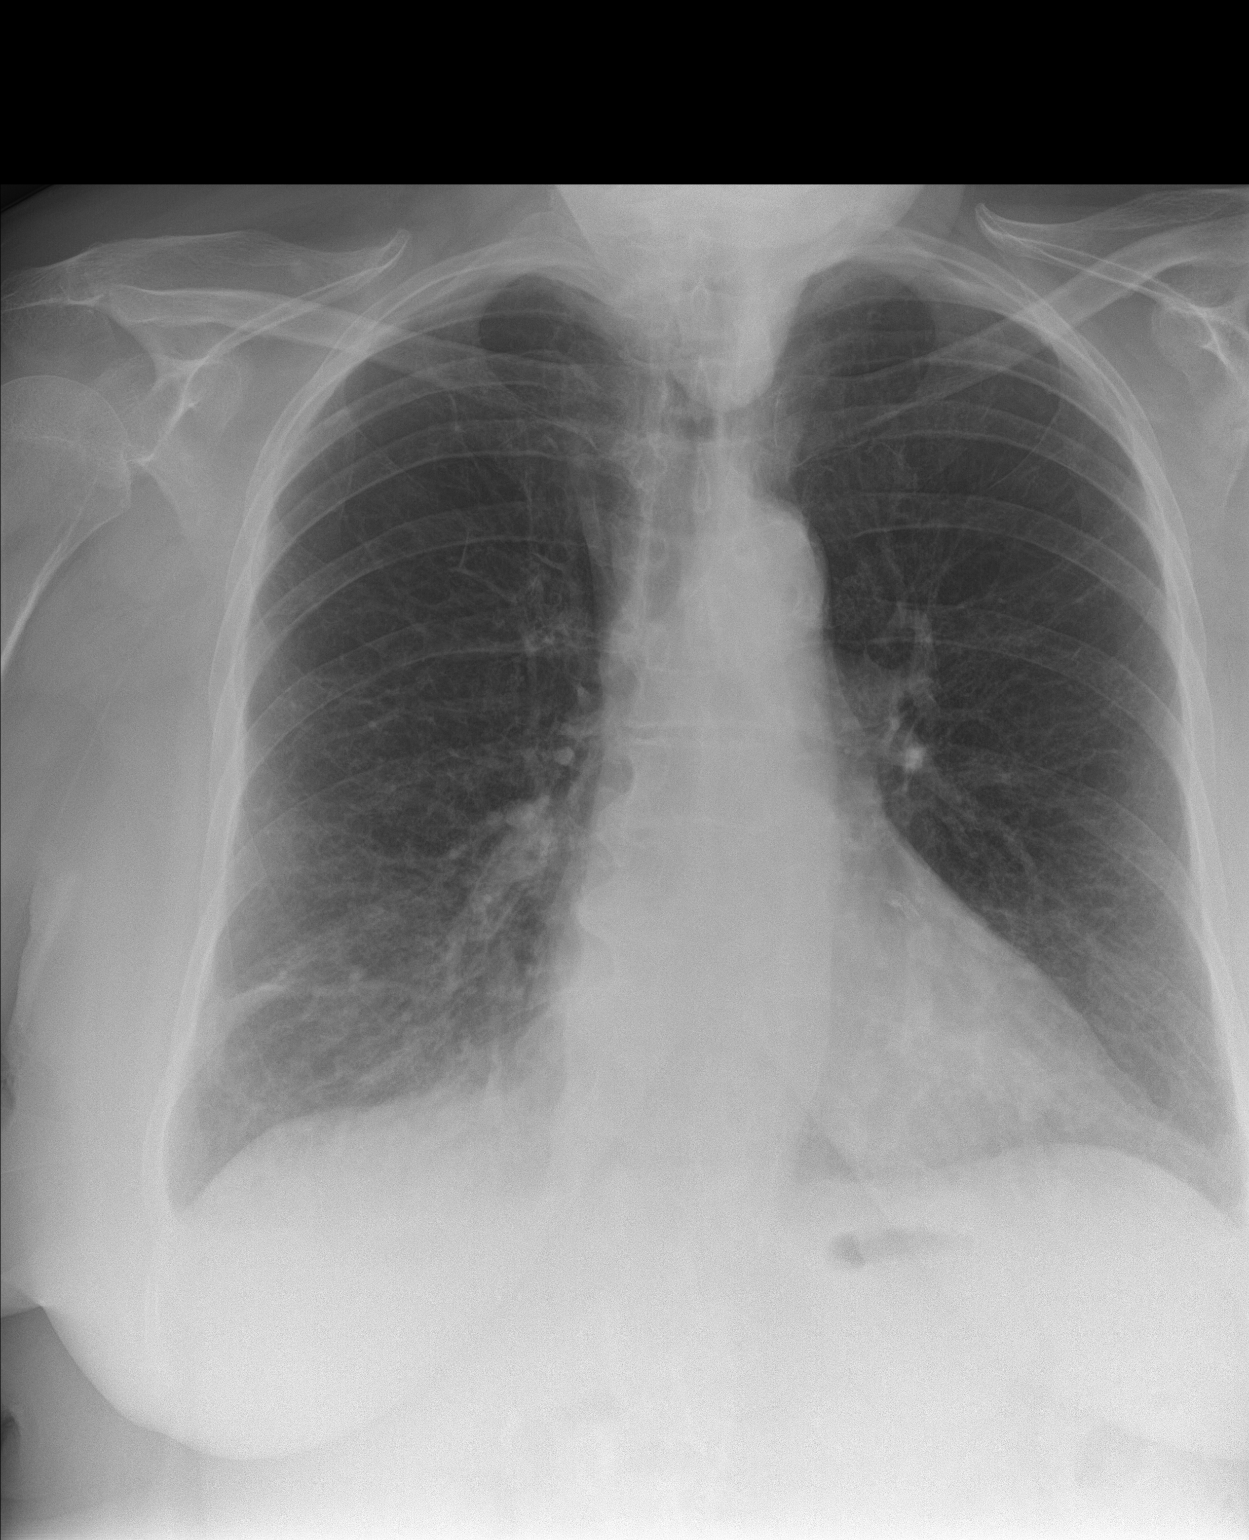

[chest pa (2 of 2)]
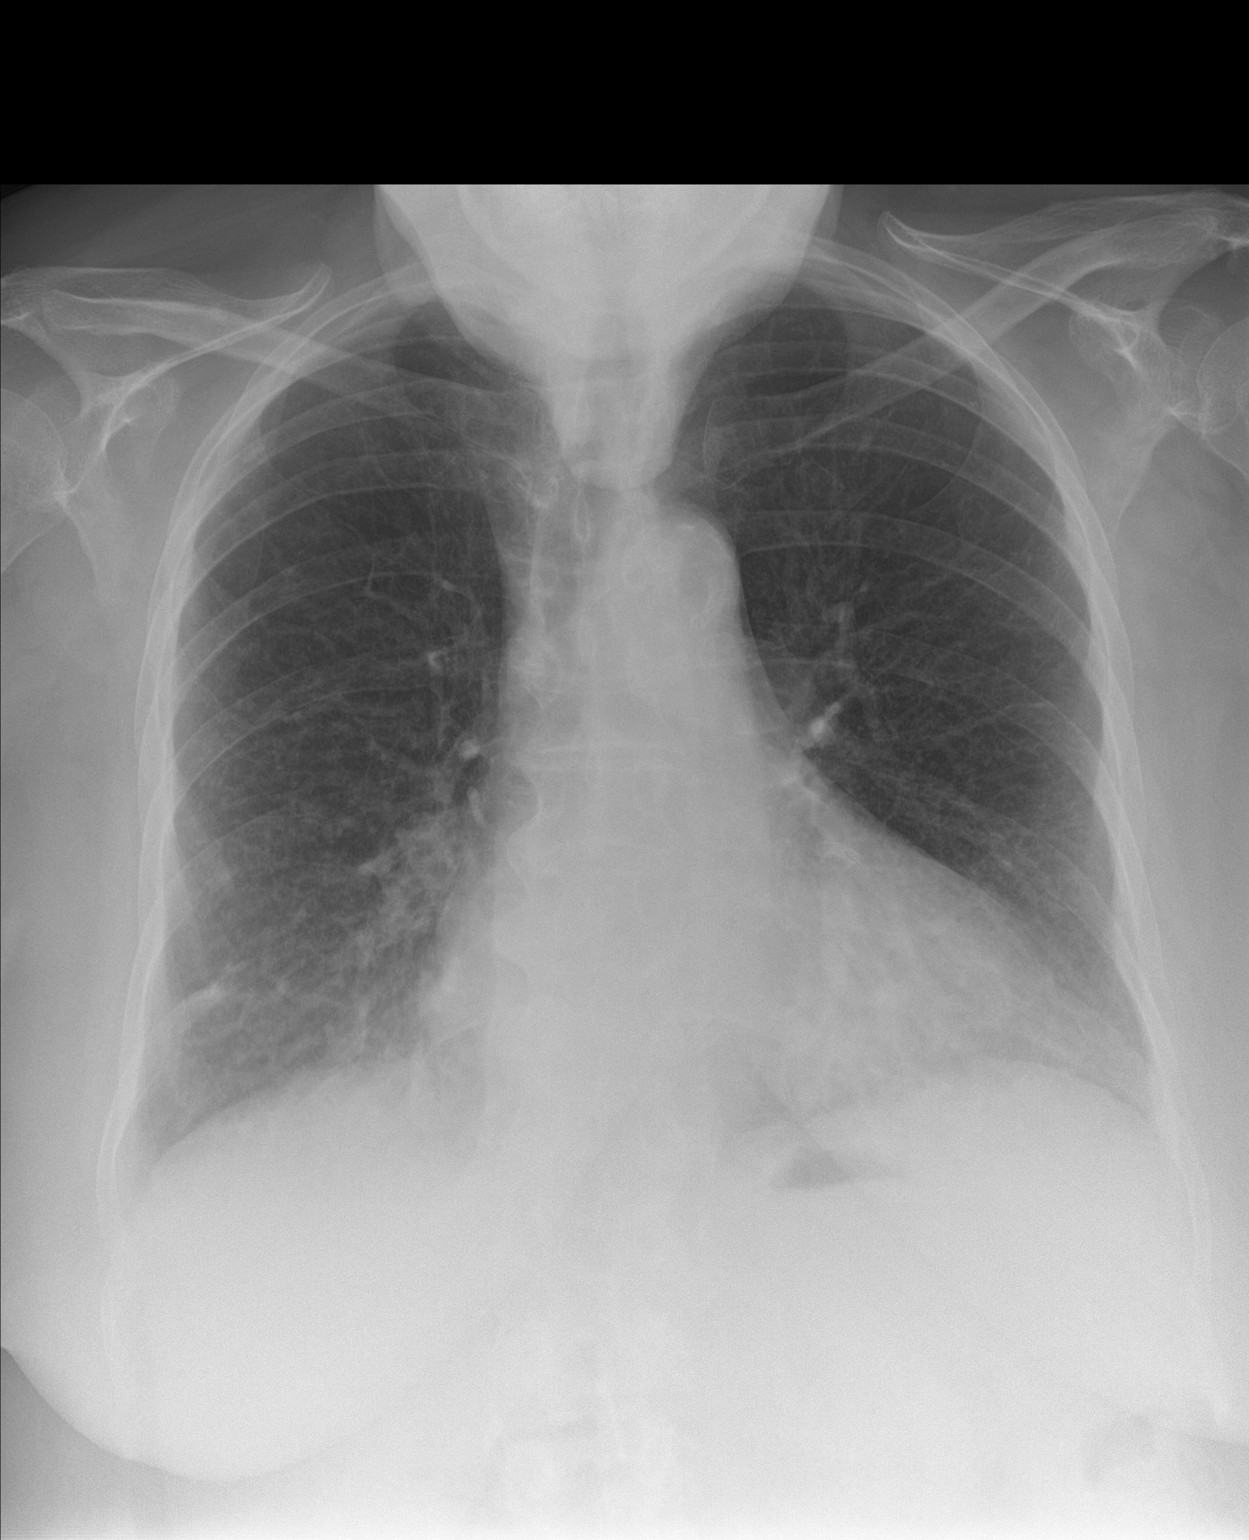

[chest lat]
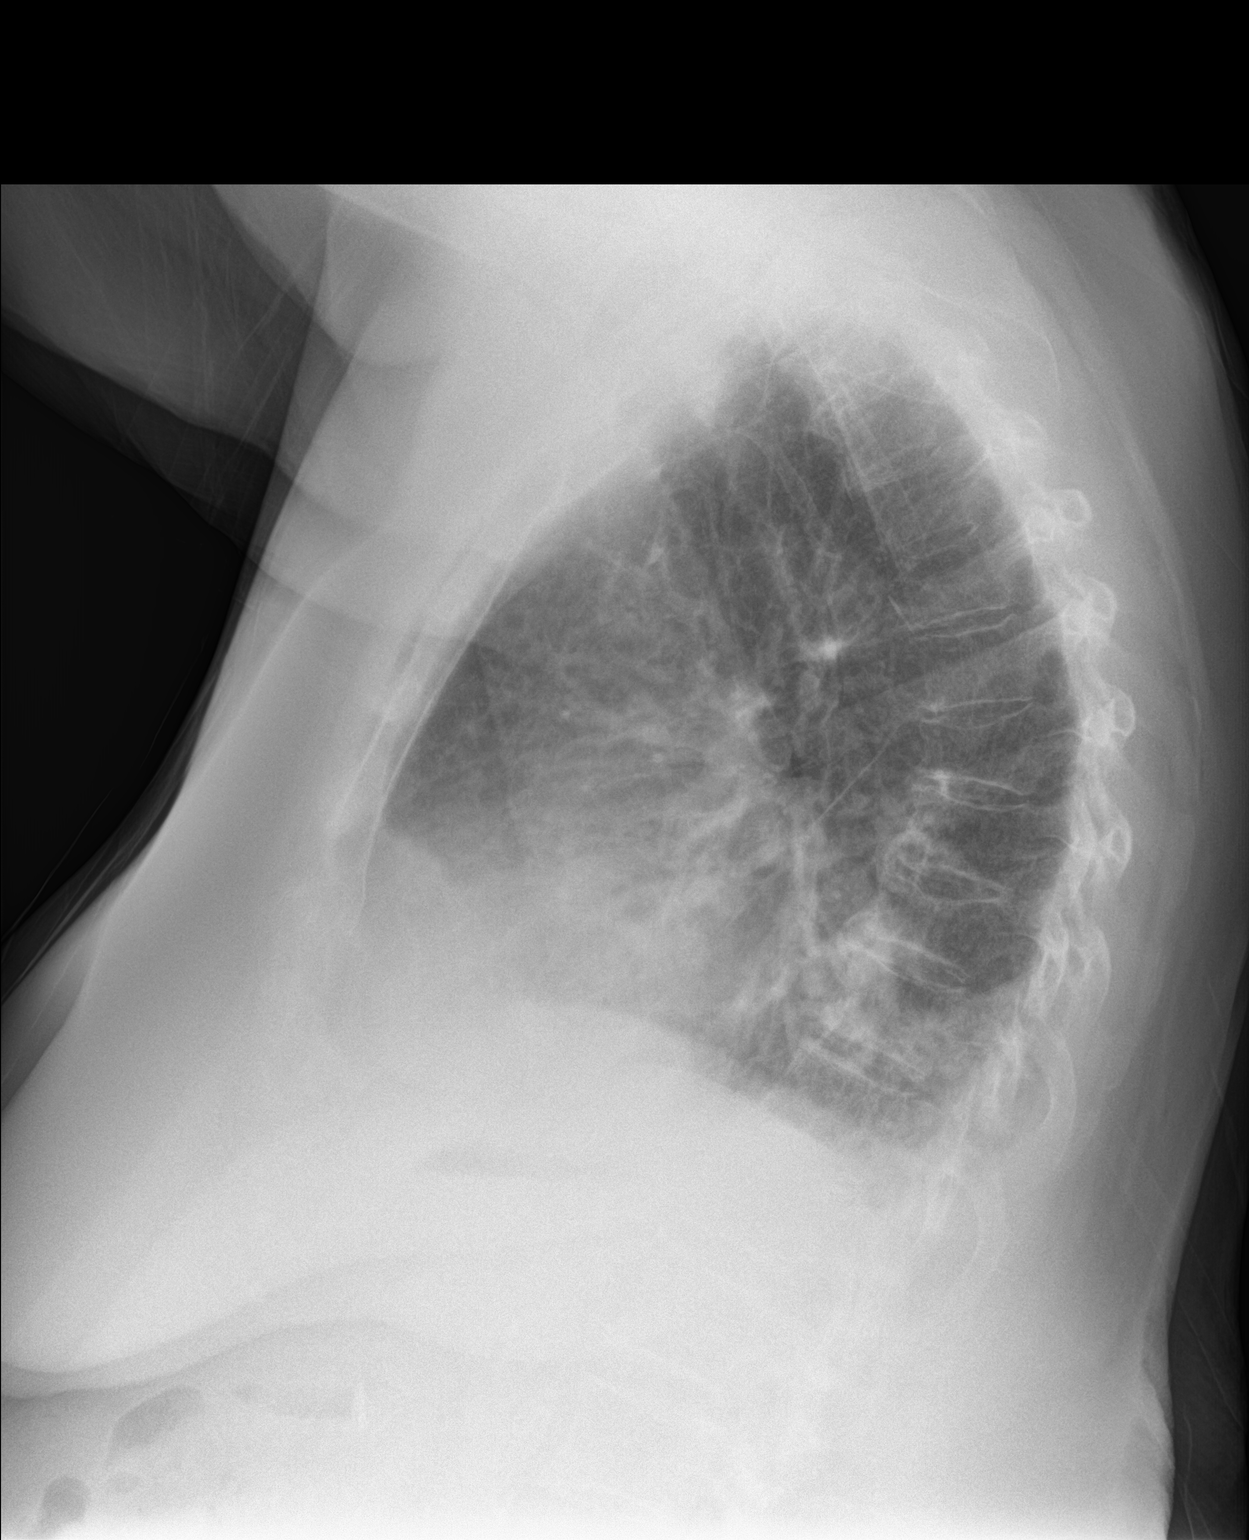

[3 of 3 positions shown; findings below may reference images not displayed]

FINDINGS: Stable cardiomediastinal silhouette. No pneumothorax or pleural
effusion is noted. Stable bibasilar interstitial densities are noted
most consistent with scarring or possibly subsegmental atelectasis.
Stable compression fracture is noted in mid thoracic vertebral body.
IMPRESSION: Stable bibasilar interstitial densities are noted concerning for
subsegmental atelectasis or possibly scarring.

## 2017-10-13 ENCOUNTER — Ambulatory Visit: Payer: Self-pay | Admitting: Internal Medicine

## 2017-10-18 ENCOUNTER — Telehealth: Payer: Self-pay

## 2017-10-18 ENCOUNTER — Ambulatory Visit (INDEPENDENT_AMBULATORY_CARE_PROVIDER_SITE_OTHER): Payer: Medicare Other | Admitting: Family Medicine

## 2017-10-18 VITALS — BP 122/60 | HR 63 | Temp 98.2°F | Ht 64.0 in | Wt 159.4 lb

## 2017-10-18 DIAGNOSIS — I2510 Atherosclerotic heart disease of native coronary artery without angina pectoris: Secondary | ICD-10-CM | POA: Diagnosis not present

## 2017-10-18 DIAGNOSIS — F0280 Dementia in other diseases classified elsewhere without behavioral disturbance: Secondary | ICD-10-CM | POA: Diagnosis not present

## 2017-10-18 DIAGNOSIS — G301 Alzheimer's disease with late onset: Secondary | ICD-10-CM | POA: Diagnosis not present

## 2017-10-18 DIAGNOSIS — E079 Disorder of thyroid, unspecified: Secondary | ICD-10-CM | POA: Diagnosis not present

## 2017-10-18 DIAGNOSIS — J440 Chronic obstructive pulmonary disease with acute lower respiratory infection: Secondary | ICD-10-CM | POA: Diagnosis not present

## 2017-10-18 DIAGNOSIS — F32A Depression, unspecified: Secondary | ICD-10-CM

## 2017-10-18 DIAGNOSIS — I10 Essential (primary) hypertension: Secondary | ICD-10-CM | POA: Diagnosis not present

## 2017-10-18 DIAGNOSIS — F329 Major depressive disorder, single episode, unspecified: Secondary | ICD-10-CM | POA: Diagnosis not present

## 2017-10-18 DIAGNOSIS — E785 Hyperlipidemia, unspecified: Secondary | ICD-10-CM

## 2017-10-18 DIAGNOSIS — N183 Chronic kidney disease, stage 3 unspecified: Secondary | ICD-10-CM

## 2017-10-18 DIAGNOSIS — I2583 Coronary atherosclerosis due to lipid rich plaque: Secondary | ICD-10-CM

## 2017-10-18 MED ORDER — AMLODIPINE BESYLATE 5 MG PO TABS: 5.0000 mg | ORAL_TABLET | Freq: Every day | ORAL | 3 refills | Status: DC

## 2017-10-18 NOTE — Assessment & Plan Note (Signed)
Daughter feels her symptoms are controlled on current rxs. No changes made.

## 2017-10-18 NOTE — Assessment & Plan Note (Signed)
Progressive but seems to be stable currently. Continue namenda. In a memory care facility.

## 2017-10-18 NOTE — Assessment & Plan Note (Signed)
Now with signs and symptoms of hypotension. Decrease norvasc to 5 mg daily, continue current dose of lisinopril.  I have asked her SNF to check her BP daily and update me in 2 weeks, sooner if she has sustained elevated readings above 145/90. The patient's daughter indicates understanding of these issues and agrees with the plan.

## 2017-10-18 NOTE — Assessment & Plan Note (Signed)
Labs today

## 2017-10-18 NOTE — Progress Notes (Signed)
Subjective:   Patient ID: Joy [name], female    DOB: 01-19-1930, 84 y.o.   MRN: [id number]  Joy [name] is a pleasant 84 y.o. year old female who presents to clinic today with Follow-up  on 10/18/2017  HPI:  Here with her daughter, [name].  [facility].  Weight has remained stable since 05/2017.  HLD- compliant with zocor 40 mg daily.  Lab Results  Component Value Date   CHOL 136 07/21/2016   HDL 34.70 (L) 07/21/2016   LDLCALC 71 07/21/2016   LDLDIRECT 99.0 01/02/2015   TRIG 154.0 (H) 07/21/2016   CHOLHDL 4 07/21/2016   Lab Results  Component Value Date   ALT 8 (L) 09/04/2016   AST 18 09/04/2016   ALKPHOS 95 09/04/2016   BILITOT 0.7 09/04/2016   HTN- has been running low on Norvasc 10 mg daily and lisinopril 10 mg daily.   Hypothyroidism- has been euthyroid on low dose synthroid, 25 mcg daily.  Lab Results  Component Value Date   TSH 1.53 07/21/2016     Dementia- has progressed.  She is now in [place] at [facility].  Taking Namenda. Also has been taking zoloft and wellbutrin for years for depression.  No recent COPD hospitalizations.  Has appointment with pulmonary tomorrow.   Current Outpatient Medications on File Prior to Visit  Medication Sig Dispense Refill  . acetaminophen (TYLENOL) 650 MG CR tablet Take 650 mg by mouth every 8 (eight) hours as needed for pain.    Marland Kitchen ADVAIR DISKUS 250-50 MCG/DOSE AEPB INHALE 1 PUFF BY MOUTH TWICE A DAY 60 each 0  . albuterol (PROVENTIL HFA;VENTOLIN HFA) 108 (90 Base) MCG/ACT inhaler Inhale 2 puffs into the lungs every 6 (six) hours as needed for wheezing or shortness of breath. 1 Inhaler 0  . atorvastatin (LIPITOR) 20 MG tablet Take 20 mg by mouth daily.    Marland Kitchen buPROPion (WELLBUTRIN XL) 150 MG 24 hr tablet Take 150 mg by mouth daily.    . clopidogrel (PLAVIX) 75 MG tablet Take 1 tablet (75 mg total) by mouth daily. 90 tablet 1  . ipratropium-albuterol (DUONEB) 0.5-2.5 (3) MG/3ML SOLN Take 3 mLs  by nebulization every 4 (four) hours as needed (Wheezing/Dyspnea).    Marland Kitchen levothyroxine (SYNTHROID, LEVOTHROID) 25 MCG tablet Take 25 mcg by mouth daily before breakfast.    . lisinopril (PRINIVIL,ZESTRIL) 10 MG tablet Take 1 tablet (10 mg total) by mouth daily. 30 tablet 5  . memantine (NAMENDA) 10 MG tablet Take 10 mg by mouth 2 (two) times daily.    . nitroGLYCERIN (NITROSTAT) 0.4 MG SL tablet Place 0.4 mg under the tongue every 5 (five) minutes as needed.      . pantoprazole (PROTONIX) 40 MG tablet Take 40 mg by mouth daily.    . polyethylene glycol (MIRALAX / GLYCOLAX) packet Take 17 g by mouth daily.    . sertraline (ZOLOFT) 100 MG tablet TAKE 1 TABLET BY MOUTH EVERY DAY (Patient taking differently: TAKE 1 1/2 TABLET BY MOUTH EVERY DAY) 30 tablet 1  . SPIRIVA HANDIHALER 18 MCG inhalation capsule PLACE 1 CAPSULE (18 MCG TOTAL) INTO INHALER AND INHALE DAILY. 30 capsule 10  . traZODone (DESYREL) 50 MG tablet Take 25-50 mg by mouth at bedtime as needed for sleep.     . vitamin B-12 (CYANOCOBALAMIN) 1000 MCG tablet Take 1,000 mcg by mouth daily.    . Vitamin D, Ergocalciferol, (DRISDOL) 50000 units CAPS capsule TAKE 1 CAPSULE BY MOUTH EVERY 7 DAYS    .  traMADol (ULTRAM) 50 MG tablet Take 1 tablet (50 mg total) by mouth every 12 (twelve) hours as needed. (Patient not taking: Reported on 10/18/2017) 30 tablet 0   No current facility-administered medications on file prior to visit.     Allergies  Allergen Reactions  . Prednisone Other (See Comments)    Weight gain,puffy face  . Shellfish Allergy Other (See Comments)    Unknown    Past Medical History:  Diagnosis Date  . Alzheimer disease   . Anxiety   . CAD (coronary artery disease)    h/o NSTEMI, LAD stent placement 10/04, cath 05/11/2010  . Carotid artery occlusion   . COPD (chronic obstructive pulmonary disease) ([place])   . Depression   . Diastolic dysfunction    Echo 05/11/2010, EF 65-70%  . HLD (hyperlipidemia)   . HTN (hypertension)    . Hx of MELAS    MELAS CARRIER  . Hypertension   . Kidney infection   . PMR (polymyalgia rheumatica) (HCC)    with h/o steroid induced myopathy  . Thyroid disease   . TIA (transient ischemic attack)    11/05 s/p L CEA    Past Surgical History:  Procedure Laterality Date  . CARDIAC CATHETERIZATION     unc  . CARDIAC CATHETERIZATION    . CARDIAC CATHETERIZATION    . CAROTID ENDARTERECTOMY     left with stent placement 11/2003  . ILIAC ARTERY STENT     right, 2001  . LUNG BIOPSY      Family History  Problem Relation Age of Onset  . Arthritis Mother   . Arthritis Father   . Glaucoma Father   . Alzheimer's disease Brother   . Heart disease Brother   . Arthritis Brother   . Heart disease Brother   . Arthritis Brother   . Glaucoma Brother     Social History   Socioeconomic History  . Marital status: Married    Spouse name: Not on file  . Number of children: Not on file  . Years of education: Not on file  . Highest education level: Not on file  Occupational History  . Occupation: retired    Comment: Pharmacist, hospital  Social Needs  . Financial resource strain: Not on file  . Food insecurity:    Worry: Not on file    Inability: Not on file  . Transportation needs:    Medical: Not on file    Non-medical: Not on file  Tobacco Use  . Smoking status: Former Smoker    Packs/day: 1.00    Years: 25.00    Pack years: 25.00    Types: Cigarettes    Last attempt to quit: 01/05/1996    Years since quitting: 21.8  . Smokeless tobacco: Never Used  Substance and Sexual Activity  . Alcohol use: No  . Drug use: No  . Sexual activity: Never  Lifestyle  . Physical activity:    Days per week: Not on file    Minutes per session: Not on file  . Stress: Not on file  Relationships  . Social connections:    Talks on phone: Not on file    Gets together: Not on file    Attends religious service: Not on file    Active member of club or organization: Not on file    Attends meetings of  clubs or organizations: Not on file    Relationship status: Not on file  . Intimate partner violence:    Fear of current or  ex partner: Not on file    Emotionally abused: Not on file    Physically abused: Not on file    Forced sexual activity: Not on file  Other Topics Concern  . Not on file  Social History Narrative   Lives with husband, [name] at [place].   Daughters, [name] and [name] very involved.   The PMH, PSH, Social History, Family History, Medications, and allergies have been reviewed in [facility], and have been updated if relevant.  Review of Systems  Constitutional: Positive for fatigue.  HENT: Negative.   Eyes: Negative.   Respiratory: Negative.   Cardiovascular: Negative.   Gastrointestinal: Negative.   Endocrine: Negative.   Genitourinary: Negative.   Musculoskeletal: Negative.   Allergic/Immunologic: Negative.   Neurological: Positive for dizziness.  Hematological: Negative.   Psychiatric/Behavioral: Negative.   All other systems reviewed and are negative.      Objective:    BP 122/60 (BP Location: Left Arm, Patient Position: Sitting, Cuff Size: Normal)   Pulse 63   Temp 98.2 F (36.8 C) (Oral)   Ht 5\' 4"  (1.626 m)   Wt 159 lb 6.4 oz (72.3 kg)   SpO2 92%   BMI 27.36 kg/m   Wt Readings from Last 3 Encounters:  10/18/17 159 lb 6.4 oz (72.3 kg)  05/10/17 159 lb (72.1 kg)  04/21/17 157 lb (71.2 kg)     Physical Exam  Constitutional: She is oriented to person, place, and time. No distress.  HENT:  Head: Normocephalic and atraumatic.  O2 per [place]  Eyes: EOM are normal.  Neck: Normal range of motion.  Cardiovascular: Normal rate and regular rhythm.  Pulmonary/Chest: Effort normal and breath sounds normal.  Abdominal: Soft. Bowel sounds are normal.  Musculoskeletal:  Walks with walker  Neurological: She is alert and oriented to person, place, and time. She displays normal reflexes. No cranial nerve deficit. She exhibits normal muscle tone. Coordination  normal.  Skin: Skin is warm and dry. She is not diaphoretic.  Psychiatric: She has a normal mood and affect. Her behavior is normal. Judgment and thought content normal.  Nursing note and vitals reviewed.         Assessment & Plan:   Essential hypertension - Plan: Comprehensive metabolic panel  Hyperlipidemia, unspecified hyperlipidemia type - Plan: Lipid panel, CBC  Chronic obstructive pulmonary disease with acute lower respiratory infection (HCC)  CKD (chronic kidney disease) stage 3, GFR 30-59 ml/min (HCC) - Plan: Comprehensive metabolic panel  Late onset Alzheimer's disease without behavioral disturbance (HCC)  Thyroid disease - Plan: TSH, T4, free, T3 No follow-ups on file.

## 2017-10-18 NOTE — Patient Instructions (Addendum)
Great to see you. I will call you with your lab results from today and you can view them online.   We are decreasing your Norvasc to 5 mg daily.  Please ask the nursing staff to keep   Schedule your annual wellness visit on your way out.

## 2017-10-18 NOTE — Addendum Note (Signed)
Addended by: Diona Foley on: 10/18/2017 12:43 PM   Modules accepted: Orders

## 2017-10-18 NOTE — Telephone Encounter (Signed)
Copied from [place] [id number]. Topic: General - Other >> Oct 18, 2017  2:52 PM Leward Quan A wrote: Reason for CRM: [name] patient daughter called to say that mom Blood can be drawn at memory care could you please fax orders there.     Fax# [phone]   Any questions please call  [name] [phone]

## 2017-10-18 NOTE — Assessment & Plan Note (Signed)
On statin. Check lipids and CMET today.

## 2017-10-18 NOTE — Assessment & Plan Note (Signed)
No recent hospitalizations for COPD exacerbations. Sees her pulmonologist tomorrow.

## 2017-10-18 NOTE — Addendum Note (Signed)
Addended by: Diona Foley on: 10/18/2017 12:44 PM   Modules accepted: Orders

## 2017-10-18 NOTE — Assessment & Plan Note (Signed)
Continue current dose of synthroid. Check thyroid labs today.

## 2017-10-20 ENCOUNTER — Encounter: Payer: Self-pay | Admitting: Internal Medicine

## 2017-10-20 ENCOUNTER — Ambulatory Visit (INDEPENDENT_AMBULATORY_CARE_PROVIDER_SITE_OTHER): Payer: Medicare Other | Admitting: Internal Medicine

## 2017-10-20 VITALS — BP 130/80 | HR 62 | Resp 16 | Ht 64.0 in | Wt 160.0 lb

## 2017-10-20 DIAGNOSIS — J449 Chronic obstructive pulmonary disease, unspecified: Secondary | ICD-10-CM

## 2017-10-20 DIAGNOSIS — I2510 Atherosclerotic heart disease of native coronary artery without angina pectoris: Secondary | ICD-10-CM | POA: Diagnosis not present

## 2017-10-20 DIAGNOSIS — N183 Chronic kidney disease, stage 3 unspecified: Secondary | ICD-10-CM

## 2017-10-20 DIAGNOSIS — I2583 Coronary atherosclerosis due to lipid rich plaque: Secondary | ICD-10-CM | POA: Diagnosis not present

## 2017-10-20 DIAGNOSIS — E785 Hyperlipidemia, unspecified: Secondary | ICD-10-CM | POA: Diagnosis not present

## 2017-10-20 DIAGNOSIS — J9611 Chronic respiratory failure with hypoxia: Secondary | ICD-10-CM | POA: Diagnosis not present

## 2017-10-20 DIAGNOSIS — I5032 Chronic diastolic (congestive) heart failure: Secondary | ICD-10-CM | POA: Diagnosis not present

## 2017-10-20 DIAGNOSIS — R0602 Shortness of breath: Secondary | ICD-10-CM | POA: Diagnosis not present

## 2017-10-20 DIAGNOSIS — I10 Essential (primary) hypertension: Secondary | ICD-10-CM | POA: Diagnosis not present

## 2017-10-20 DIAGNOSIS — E079 Disorder of thyroid, unspecified: Secondary | ICD-10-CM | POA: Diagnosis not present

## 2017-10-20 LAB — CBC AND DIFFERENTIAL
HCT: 31 — AB (ref 36–46)
Hemoglobin: 10.4 — AB (ref 12.0–16.0)
Platelets: 129 — AB (ref 150–399)
WBC: 4.8

## 2017-10-20 LAB — LIPID PANEL
Cholesterol: 123 (ref 0–200)
HDL: 39 (ref 35–70)
LDL Cholesterol: 59
LDl/HDL Ratio: 3.2
TRIGLYCERIDES: 167 — AB (ref 40–160)

## 2017-10-20 LAB — BASIC METABOLIC PANEL
BUN: 34 — AB (ref 4–21)
CREATININE: 1.2 — AB (ref 0.5–1.1)
Glucose: 80
POTASSIUM: 4.6 (ref 3.4–5.3)
Sodium: 142 (ref 137–147)

## 2017-10-20 LAB — HEPATIC FUNCTION PANEL
ALK PHOS: 99 (ref 25–125)
ALT: 12 (ref 7–35)
AST: 17 (ref 13–35)
BILIRUBIN, TOTAL: 0.4

## 2017-10-20 LAB — TSH: TSH: 3.27 (ref 0.41–5.90)

## 2017-10-20 NOTE — Patient Instructions (Signed)

## 2017-10-20 NOTE — Progress Notes (Signed)
[facility] [place], [place] [address]  Pulmonary Sleep Medicine   Office Visit Note  Patient Name: Joy [name] DOB: 1930/10/17 MRN [id number]  Date of Service: 10/20/2017  Complaints/HPI: She is doing fairly well has been no admissions to the hospital.  She continues to use her oxygen as prescribed.  She has had no cough and congestion.  She is using her inhalers as prescribed.  Tolerating the inhalers fairly well.  She has not had any decompensation of her heart failure.  ROS  General: (-) fever, (-) chills, (-) night sweats, (-) weakness Skin: (-) rashes, (-) itching,. Eyes: (-) visual changes, (-) redness, (-) itching. Nose and Sinuses: (-) nasal stuffiness or itchiness, (-) postnasal drip, (-) nosebleeds, (-) sinus trouble. Mouth and Throat: (-) sore throat, (-) hoarseness. Neck: (-) swollen glands, (-) enlarged thyroid, (-) neck pain. Respiratory: - cough, (-) bloody sputum, + shortness of breath, - wheezing. Cardiovascular: - ankle swelling, (-) chest pain. Lymphatic: (-) lymph node enlargement. Neurologic: (-) numbness, (-) tingling. Psychiatric: (-) anxiety, (-) depression   Current Medication: Outpatient Encounter Medications as of 10/20/2017  Medication Sig  . acetaminophen (TYLENOL) 650 MG CR tablet Take 650 mg by mouth every 8 (eight) hours as needed for pain.  Marland Kitchen ADVAIR DISKUS 250-50 MCG/DOSE AEPB INHALE 1 PUFF BY MOUTH TWICE A DAY  . albuterol (PROVENTIL HFA;VENTOLIN HFA) 108 (90 Base) MCG/ACT inhaler Inhale 2 puffs into the lungs every 6 (six) hours as needed for wheezing or shortness of breath.  Marland Kitchen amLODipine (NORVASC) 5 MG tablet Take 1 tablet (5 mg total) by mouth daily.  Marland Kitchen atorvastatin (LIPITOR) 20 MG tablet Take 20 mg by mouth daily.  Marland Kitchen buPROPion (WELLBUTRIN XL) 150 MG 24 hr tablet Take 150 mg by mouth daily.  . clopidogrel (PLAVIX) 75 MG tablet Take 1 tablet (75 mg total) by mouth daily.  Marland Kitchen ipratropium-albuterol (DUONEB)  0.5-2.5 (3) MG/3ML SOLN Take 3 mLs by nebulization every 4 (four) hours as needed (Wheezing/Dyspnea).  Marland Kitchen levothyroxine (SYNTHROID, LEVOTHROID) 25 MCG tablet Take 25 mcg by mouth daily before breakfast.  . lisinopril (PRINIVIL,ZESTRIL) 10 MG tablet Take 1 tablet (10 mg total) by mouth daily.  . memantine (NAMENDA) 10 MG tablet Take 10 mg by mouth 2 (two) times daily.  . nitroGLYCERIN (NITROSTAT) 0.4 MG SL tablet Place 0.4 mg under the tongue every 5 (five) minutes as needed.    . pantoprazole (PROTONIX) 40 MG tablet Take 40 mg by mouth daily.  . polyethylene glycol (MIRALAX / GLYCOLAX) packet Take 17 g by mouth daily.  . sertraline (ZOLOFT) 100 MG tablet TAKE 1 TABLET BY MOUTH EVERY DAY (Patient taking differently: TAKE 1 1/2 TABLET BY MOUTH EVERY DAY)  . SPIRIVA HANDIHALER 18 MCG inhalation capsule PLACE 1 CAPSULE (18 MCG TOTAL) INTO INHALER AND INHALE DAILY.  . traZODone (DESYREL) 50 MG tablet Take 25-50 mg by mouth at bedtime as needed for sleep.   . vitamin B-12 (CYANOCOBALAMIN) 1000 MCG tablet Take 1,000 mcg by mouth daily.  . Vitamin D, Ergocalciferol, (DRISDOL) 50000 units CAPS capsule TAKE 1 CAPSULE BY MOUTH EVERY 7 DAYS  . traMADol (ULTRAM) 50 MG tablet Take 1 tablet (50 mg total) by mouth every 12 (twelve) hours as needed. (Patient not taking: Reported on 10/18/2017)   No facility-administered encounter medications on file as of 10/20/2017.     Surgical History: Past Surgical History:  Procedure Laterality Date  . CARDIAC CATHETERIZATION     unc  . CARDIAC CATHETERIZATION    .  CARDIAC CATHETERIZATION    . CAROTID ENDARTERECTOMY     left with stent placement 11/2003  . ILIAC ARTERY STENT     right, 2001  . LUNG BIOPSY      Medical History: Past Medical History:  Diagnosis Date  . Alzheimer disease ([place])   . Anxiety   . CAD (coronary artery disease)    h/o NSTEMI, LAD stent placement 10/04, cath 05/11/2010  . Carotid artery occlusion   . COPD (chronic obstructive pulmonary  disease) ([place])   . Depression   . Diastolic dysfunction    Echo 05/11/2010, EF 65-70%  . HLD (hyperlipidemia)   . HTN (hypertension)   . Hx of MELAS    MELAS CARRIER  . Hypertension   . Kidney infection   . PMR (polymyalgia rheumatica) (HCC)    with h/o steroid induced myopathy  . Thyroid disease   . TIA (transient ischemic attack)    11/05 s/p L CEA    Family History: Family History  Problem Relation Age of Onset  . Arthritis Mother   . Arthritis Father   . Glaucoma Father   . Alzheimer's disease Brother   . Heart disease Brother   . Arthritis Brother   . Heart disease Brother   . Arthritis Brother   . Glaucoma Brother     Social History: Social History   Socioeconomic History  . Marital status: Married    Spouse name: Not on file  . Number of children: Not on file  . Years of education: Not on file  . Highest education level: Not on file  Occupational History  . Occupation: retired    Comment: Pharmacist, hospital  Social Needs  . Financial resource strain: Not on file  . Food insecurity:    Worry: Not on file    Inability: Not on file  . Transportation needs:    Medical: Not on file    Non-medical: Not on file  Tobacco Use  . Smoking status: Former Smoker    Packs/day: 1.00    Years: 25.00    Pack years: 25.00    Types: Cigarettes    Last attempt to quit: 01/05/1996    Years since quitting: 21.8  . Smokeless tobacco: Never Used  Substance and Sexual Activity  . Alcohol use: No  . Drug use: No  . Sexual activity: Never  Lifestyle  . Physical activity:    Days per week: Not on file    Minutes per session: Not on file  . Stress: Not on file  Relationships  . Social connections:    Talks on phone: Not on file    Gets together: Not on file    Attends religious service: Not on file    Active member of club or organization: Not on file    Attends meetings of clubs or organizations: Not on file    Relationship status: Not on file  . Intimate partner violence:     Fear of current or ex partner: Not on file    Emotionally abused: Not on file    Physically abused: Not on file    Forced sexual activity: Not on file  Other Topics Concern  . Not on file  Social History Narrative   Lives with husband, [name] at [place].   Daughters, [name] and [name] very involved.    Vital Signs: Blood pressure 130/80, pulse 62, resp. rate 16, height 5\' 4"  (1.626 m), weight 160 lb (72.6 kg), SpO2 94 %.  Examination: General  Appearance: The patient is well-developed, well-nourished, and in no distress. Skin: Gross inspection of skin unremarkable. Head: normocephalic, no gross deformities. Eyes: no gross deformities noted. ENT: ears appear grossly normal no exudates. Neck: Supple. No thyromegaly. No LAD. Respiratory: +crackles noted at this time. Cardiovascular: Normal S1 and S2 without murmur or rub. Extremities: No cyanosis. pulses are equal. Neurologic: Alert and oriented. No involuntary movements.  LABS: No results found for this or any previous visit (from the past 2160 hour(s)).  Radiology: Ct Renal Stone Study  Result Date: 09/04/2016 CLINICAL DATA:  Flank pain.  Clinical concern for nephrolithiasis. EXAM: CT ABDOMEN AND PELVIS WITHOUT CONTRAST TECHNIQUE: Multidetector CT imaging of the abdomen and pelvis was performed following the standard protocol without IV contrast. COMPARISON:  08/19/2012 CT abdomen/pelvis. FINDINGS: Lower chest: Emphysema. Scattered parenchymal bands and subpleural reticulation at the lung bases is not well characterized on these motion degraded images and is not definitely changed since 08/08/2015 chest CT. Coronary atherosclerosis. Hepatobiliary: Normal liver with no liver mass. Cholecystectomy. Bile ducts are stable and within normal post cholecystectomy limits with common bile duct diameter 5 mm. Small periampullary duodenal diverticulum. Pancreas: Normal, with no mass or duct dilation. Spleen: Mild splenomegaly (craniocaudal  splenic length 13.7 cm), which appears new. Hypodense 1.0 cm splenic lesion (series 2/ image 20) is decreased from 2.0 cm on 08/19/2012, compatible with a benign lesion. No new splenic lesions. Adrenals/Urinary Tract: Normal adrenals. No hydronephrosis. No renal stones. Stable vascular calcification in the anterior left renal sinus. Simple 1.3 cm lateral upper right renal cyst. Simple 1.0 cm lateral upper left renal cyst. No additional contour deforming renal lesions. Normal caliber ureters, with no ureteral stones. Normal bladder. Stomach/Bowel: Grossly normal stomach. Normal caliber small bowel with no small bowel wall thickening. Normal appendix . Normal large bowel with no diverticulosis, large bowel wall thickening or pericolonic fat stranding. Vascular/Lymphatic: Atherosclerotic nonaneurysmal abdominal aorta. No pathologically enlarged lymph nodes in the abdomen or pelvis. Reproductive: Stable coarsely calcified subcentimeter right fundal uterine fibroid. No adnexal masses. Other: No pneumoperitoneum, ascites or focal fluid collection. Musculoskeletal: No aggressive appearing focal osseous lesions. Moderate thoracolumbar spondylosis. IMPRESSION: 1. No hydronephrosis.  No urolithiasis. 2. No evidence of bowel obstruction or acute bowel inflammation. Normal appendix. 3. Mild splenomegaly, which appears new. No abdominopelvic lymphadenopathy. 4. Aortic Atherosclerosis (ICD10-I70.0) and Emphysema (ICD10-J43.9). 5. Coronary atherosclerosis. Electronically Signed   By: Ilona Sorrel M.D.   On: 09/04/2016 16:31    No results found.  No results found.    Assessment and Plan: Patient Active Problem List   Diagnosis Date Noted  . Alzheimer's dementia without behavioral disturbance ([place])   . Palliative care by specialist   . DNR (do not resuscitate)   . Goals of care, counseling/discussion   . Gabapentin-induced toxicity 04/04/2016  . Allergic rhinitis 11/04/2014  . Chronic diastolic heart failure ([place])  07/22/2014  . Flash pulmonary edema ([place]) 06/30/2014  . CKD (chronic kidney disease) stage 3, GFR 30-59 ml/min (HCC) 06/11/2014  . Carotid artery disease ([place]) 04/08/2014  . CAD (coronary artery disease)   . HLD (hyperlipidemia)   . Lung nodule 01/15/2013  . Weakness generalized 12/26/2012  . Incontinence 12/26/2012  . GERD (gastroesophageal reflux disease) 09/06/2012  . Neuropathy 01/14/2011  . Insomnia 01/14/2011  . MELAS (mitochondrial encephalopathy, lactic acidosis and stroke-like episodes) ([place]) 11/02/2010  . HTN (hypertension)   . COPD (chronic obstructive pulmonary disease) ([place])   . Depression   . Thyroid disease   . Cryoglobulinemia ([place]) 07/18/2010  .  Essential tremor 07/18/2010  . Open-angle glaucoma 07/18/2010  . Other specified forms of chronic ischemic heart disease 07/18/2010  . Renal cyst 07/18/2010  . Senile osteoporosis 07/18/2010  . Urge incontinence 07/18/2010    1. COPD patient will be continued on current inhaler regimen she is compensated we will continue with oxygen therapy as prescribed 2. Chronic Respiratory failure with hypoxia continue with oxygen therapy as noted 3. CAD stable no active chest pain 4. Chronic diastolic failure compensated continue with present management 5. CKD III patient being followed by primary care follow-up on labs.  With primary care  General Counseling: I have discussed the findings of the evaluation and examination with Izora Gala.  I have also discussed any further diagnostic evaluation thatmay be needed or ordered today. Prajna verbalizes understanding of the findings of todays visit. We also reviewed her medications today and discussed drug interactions and side effects including but not limited excessive drowsiness and altered mental states. We also discussed that there is always a risk not just to her but also people around her. she has been encouraged to call the office with any questions or concerns that should arise related to  todays visit.    Time spent: 58min  I have personally obtained a history, examined the patient, evaluated laboratory and imaging results, formulated the assessment and plan and placed orders.    Allyne Gee, MD [facility] Sleep medicine

## 2017-10-25 ENCOUNTER — Telehealth: Payer: Self-pay

## 2017-10-25 NOTE — Progress Notes (Signed)
Subjective:   Joy [name] is a 84 y.o. female who presents for Medicare Annual (Subsequent) preventive examination.  Pt here with daughter. Using rolling walker and uses Oxygen 2L/  24 hrs per day.   Review of Systems: No ROS.  Medicare Wellness Visit. Additional risk factors are reflected in the social history. Cardiac Risk Factors include: advanced age (>43men, >55 women) Sleep patterns: Sleeps well per pt and dtr. Home Safety/Smoke Alarms: Feels safe in home. Smoke alarms in place. Pt lives in memory care at [facility].   Female:      Mammo-declines  Dexa scan-declines     Objective:     Vitals: BP 134/60 (BP Location: Left Arm, Patient Position: Sitting, Cuff Size: Normal)   Pulse 61   Ht 5\' 4"  (1.626 m)   Wt 160 lb 6.4 oz (72.8 kg)   SpO2 95%   BMI 27.53 kg/m   Body mass index is 27.53 kg/m.  Advanced Directives 10/26/2017 09/04/2016 07/21/2016 04/04/2016 10/04/2015 06/06/2015 09/13/2014  Does Patient Have a Medical Advance Directive? Yes Yes Yes Yes Yes No;Yes Yes  Type of Paramedic of [place];Living will Out of facility DNR (pink MOST or yellow form) [place];Living will [place] will - -  Does patient want to make changes to medical advance directive? - - - No - Patient declined No - Patient declined - No - Patient declined  Copy of [place] in Chart? Yes - Yes No - copy requested No - copy requested - -    Tobacco Social History   Tobacco Use  Smoking Status Former Smoker  . Packs/day: 1.00  . Years: 25.00  . Pack years: 25.00  . Types: Cigarettes  . Last attempt to quit: 01/05/1996  . Years since quitting: 21.8  Smokeless Tobacco Never Used     Counseling given: Not Answered   Clinical Intake: Pain : No/denies pain      Past Medical History:  Diagnosis Date  . Alzheimer disease ([place])   . Anxiety   . CAD (coronary artery disease)    h/o NSTEMI, LAD stent  placement 10/04, cath 05/11/2010  . Carotid artery occlusion   . COPD (chronic obstructive pulmonary disease) ([place])   . Depression   . Diastolic dysfunction    Echo 05/11/2010, EF 65-70%  . HLD (hyperlipidemia)   . HTN (hypertension)   . Hx of MELAS    MELAS CARRIER  . Hypertension   . Kidney infection   . PMR (polymyalgia rheumatica) ([facility])    with h/o steroid induced myopathy  . Thyroid disease   . TIA (transient ischemic attack)    11/05 s/p L CEA   Past Surgical History:  Procedure Laterality Date  . CARDIAC CATHETERIZATION     [facility]  . CARDIAC CATHETERIZATION    . CARDIAC CATHETERIZATION    . CAROTID ENDARTERECTOMY     left with stent placement 11/2003  . ILIAC ARTERY STENT     right, 2001  . LUNG BIOPSY     Family History  Problem Relation Age of Onset  . Arthritis Mother   . Arthritis Father   . Glaucoma Father   . Alzheimer's disease Brother   . Heart disease Brother   . Arthritis Brother   . Heart disease Brother   . Arthritis Brother   . Glaucoma Brother    Social History   Socioeconomic History  . Marital status: Married    Spouse  name: Not on file  . Number of children: Not on file  . Years of education: Not on file  . Highest education level: Not on file  Occupational History  . Occupation: retired    Comment: Pharmacist, hospital  Social Needs  . Financial resource strain: Not on file  . Food insecurity:    Worry: Not on file    Inability: Not on file  . Transportation needs:    Medical: Not on file    Non-medical: Not on file  Tobacco Use  . Smoking status: Former Smoker    Packs/day: 1.00    Years: 25.00    Pack years: 25.00    Types: Cigarettes    Last attempt to quit: 01/05/1996    Years since quitting: 21.8  . Smokeless tobacco: Never Used  Substance and Sexual Activity  . Alcohol use: No  . Drug use: No  . Sexual activity: Never  Lifestyle  . Physical activity:    Days per week: Not on file    Minutes per session: Not on file  . Stress: Not  on file  Relationships  . Social connections:    Talks on phone: Not on file    Gets together: Not on file    Attends religious service: Not on file    Active member of club or organization: Not on file    Attends meetings of clubs or organizations: Not on file    Relationship status: Not on file  Other Topics Concern  . Not on file  Social History Narrative   Lives with husband, [name] at [facility].   Daughters, [name] and [name] very involved.    Outpatient Encounter Medications as of 10/26/2017  Medication Sig  . acetaminophen (TYLENOL) 650 MG CR tablet Take 650 mg by mouth every 8 (eight) hours as needed for pain.  Marland Kitchen ADVAIR DISKUS 250-50 MCG/DOSE AEPB INHALE 1 PUFF BY MOUTH TWICE A DAY  . albuterol (PROVENTIL HFA;VENTOLIN HFA) 108 (90 Base) MCG/ACT inhaler Inhale 2 puffs into the lungs every 6 (six) hours as needed for wheezing or shortness of breath.  Marland Kitchen amLODipine (NORVASC) 5 MG tablet Take 1 tablet (5 mg total) by mouth daily.  Marland Kitchen atorvastatin (LIPITOR) 20 MG tablet Take 20 mg by mouth daily.  Marland Kitchen buPROPion (WELLBUTRIN XL) 150 MG 24 hr tablet Take 150 mg by mouth daily.  . clopidogrel (PLAVIX) 75 MG tablet Take 1 tablet (75 mg total) by mouth daily.  Marland Kitchen ipratropium-albuterol (DUONEB) 0.5-2.5 (3) MG/3ML SOLN Take 3 mLs by nebulization every 4 (four) hours as needed (Wheezing/Dyspnea).  Marland Kitchen levothyroxine (SYNTHROID, LEVOTHROID) 25 MCG tablet Take 25 mcg by mouth daily before breakfast.  . lisinopril (PRINIVIL,ZESTRIL) 10 MG tablet Take 1 tablet (10 mg total) by mouth daily.  . memantine (NAMENDA) 10 MG tablet Take 10 mg by mouth 2 (two) times daily.  . nitroGLYCERIN (NITROSTAT) 0.4 MG SL tablet Place 0.4 mg under the tongue every 5 (five) minutes as needed.    . pantoprazole (PROTONIX) 40 MG tablet Take 40 mg by mouth daily.  . polyethylene glycol (MIRALAX / GLYCOLAX) packet Take 17 g by mouth daily.  . sertraline (ZOLOFT) 100 MG tablet TAKE 1 TABLET BY MOUTH EVERY DAY (Patient taking  differently: TAKE 1 1/2 TABLET BY MOUTH EVERY DAY)  . SPIRIVA HANDIHALER 18 MCG inhalation capsule PLACE 1 CAPSULE (18 MCG TOTAL) INTO INHALER AND INHALE DAILY.  . traMADol (ULTRAM) 50 MG tablet Take 1 tablet (50 mg total) by mouth every  12 (twelve) hours as needed.  . traZODone (DESYREL) 50 MG tablet Take 25-50 mg by mouth at bedtime as needed for sleep.   . vitamin B-12 (CYANOCOBALAMIN) 1000 MCG tablet Take 1,000 mcg by mouth daily.  . Vitamin D, Ergocalciferol, (DRISDOL) 50000 units CAPS capsule TAKE 1 CAPSULE BY MOUTH EVERY 7 DAYS   No facility-administered encounter medications on file as of 10/26/2017.     Activities of Daily Living In your present state of health, do you have any difficulty performing the following activities: 10/26/2017  Hearing? N  Vision? N  Difficulty concentrating or making decisions? Y  Walking or climbing stairs? Y  Comment uses rolling walker  Dressing or bathing? Y  Comment has assistance with walk in shower.  Doing errands, shopping? Y  Preparing Food and eating ? Y  Using the Toilet? Y  In the past six months, have you accidently leaked urine? Y  Do you have problems with loss of bowel control? Y  Managing your Medications? Y  Managing your Finances? Y  Housekeeping or managing your Housekeeping? Y  Some recent data might be hidden    Patient Care Team: Lucille Passy, MD as PCP - General (Family Medicine) Wellington Hampshire, MD (Cardiology) Mhoon, Larkin Ina, MD (Neurology) Birder Robson, MD (Ophthalmology)    Assessment:   This is a routine wellness examination for [place]. Physical assessment deferred to PCP.  Exercise Activities and Dietary recommendations Current Exercise Habits: The patient does not participate in regular exercise at present, Exercise limited by: psychological condition(s);neurologic condition(s) Diet (meal preparation, eat out, water intake, caffeinated beverages, dairy products, fruits and vegetables): well  balanced   Goals    . Maintain health and no falls       Fall Risk Fall Risk  10/26/2017 04/07/2017 07/27/2016 07/21/2016  Falls in the past year? Yes Yes Yes Yes  Comment - - Emmi Telephone Survey: data to providers prior to load -  Number falls in past yr: 2 or more 2 or more 2 or more 2 or more  Comment - - Emmi Telephone Survey Actual Response = 6 -  Injury with Fall? No No No No  Risk Factor Category  - - - High Fall Risk  Risk for fall due to : - - - Impaired balance/gait;Impaired mobility;History of fall(s)  Follow up Education provided - - -   Depression Screen PHQ 2/9 Scores 10/26/2017 05/10/2017 04/07/2017 07/21/2016  PHQ - 2 Score 0 2 0 0  PHQ- 9 Score - 10 - -     Cognitive Function MMSE - Mini Mental State Exam 10/26/2017 07/21/2016  Not completed: Unable to complete -  Orientation to time - 2  Orientation to time comments - Disoriented to date, day of the week, and year.  Orientation to Place - 4  Orientation to Place-comments - Disoriented to county, but able to name county where she previously lived in [place].  Registration - 3  Attention/ Calculation - 0  Recall - 0  Language- name 2 objects - 0  Language- repeat - 1  Language- follow 3 step command - 3  Language- read & follow direction - 0  Write a sentence - 0  Copy design - 0  Total score - 13        Immunization History  Administered Date(s) Administered  . Influenza Split 01/14/2011  . Influenza,inj,Quad PF,6+ Mos 10/13/2015  . Influenza-Unspecified 09/15/2012, 10/30/2014  . Pneumococcal Conjugate-13 08/09/2016   Screening Tests Health Maintenance  Topic Date  Due  . PNA vac Low Risk Adult (2 of 2 - PPSV23) 08/09/2017  . INFLUENZA VACCINE  09/05/2018 (Originally 08/04/2017)  . TETANUS/TDAP  10/19/2018 (Originally 12/13/1949)  . DEXA SCAN  Completed       Plan:   Please have facility send lab results to our office.     I have personally reviewed and noted the following in the patient's chart:    . Medical and social history . Use of alcohol, tobacco or illicit drugs  . Current medications and supplements . Functional ability and status . Nutritional status . Physical activity . Advanced directives . List of other physicians . Hospitalizations, surgeries, and ER visits in previous 12 months . Vitals . Screenings to include cognitive, depression, and falls . Referrals and appointments  In addition, I have reviewed and discussed with patient certain preventive protocols, quality metrics, and best practice recommendations. A written personalized care plan for preventive services as well as general preventive health recommendations were provided to patient.     Shela Nevin, [place]  10/26/2017

## 2017-10-25 NOTE — Telephone Encounter (Signed)
Tried returning a call to [employer], but the office is closed. Will try again in the morning.     Copied from [place] [phone]. Topic: General - Other >> Oct 25, 2017  4:30 PM Bea Graff, NT wrote: Reason for CRM: Andi Hence at [facility] states they drew this patients blood on Friday with them through [place], but today they received a paper from [employer] that states needing payment towards [place]. She is wanting to know what this paper is for as Dr. Hulen Shouts office did not obtain the lab draw. CB#: [phone]

## 2017-10-25 NOTE — Telephone Encounter (Signed)
Orders faxed to fax number provided below.

## 2017-10-26 ENCOUNTER — Ambulatory Visit (INDEPENDENT_AMBULATORY_CARE_PROVIDER_SITE_OTHER): Payer: Medicare Other | Admitting: *Deleted

## 2017-10-26 ENCOUNTER — Encounter: Payer: Self-pay | Admitting: *Deleted

## 2017-10-26 VITALS — BP 134/60 | HR 61 | Ht 64.0 in | Wt 160.4 lb

## 2017-10-26 DIAGNOSIS — Z0000 Encounter for general adult medical examination without abnormal findings: Secondary | ICD-10-CM | POA: Diagnosis not present

## 2017-10-26 NOTE — Progress Notes (Signed)
I reviewed health advisor's note, was available for consultation, and agree with documentation and plan.  

## 2017-10-26 NOTE — Telephone Encounter (Signed)
I called and spoke with Joy [name] & made them aware that they could throw the [place] away since the labs were already drawn. Nothing further needed.

## 2017-10-26 NOTE — Patient Instructions (Signed)
Please have facility send lab results to our office.    Joy [name] , Thank you for taking time to come for your Medicare Wellness Visit. I appreciate your ongoing commitment to your health goals. Please review the following plan we discussed and let me know if I can assist you in the future.   These are the goals we discussed: Goals    . Maintain health and no falls       This is a list of the screening recommended for you and due dates:  Health Maintenance  Topic Date Due  . Pneumonia vaccines (2 of 2 - PPSV23) 08/09/2017  . Flu Shot  09/05/2018*  . Tetanus Vaccine  10/19/2018*  . DEXA scan (bone density measurement)  Completed  *Topic was postponed. The date shown is not the original due date.     Health Maintenance for Postmenopausal Women Menopause is a normal process in which your reproductive ability comes to an end. This process happens gradually over a span of months to years, usually between the ages of 73 and 36. Menopause is complete when you have missed 12 consecutive menstrual periods. It is important to talk with your health care provider about some of the most common conditions that affect postmenopausal women, such as heart disease, cancer, and bone loss (osteoporosis). Adopting a healthy lifestyle and getting preventive care can help to promote your health and wellness. Those actions can also lower your chances of developing some of these common conditions. What should I know about menopause? During menopause, you may experience a number of symptoms, such as:  Moderate-to-severe hot flashes.  Night sweats.  Decrease in sex drive.  Mood swings.  Headaches.  Tiredness.  Irritability.  Memory problems.  Insomnia.  Choosing to treat or not to treat menopausal changes is an individual decision that you make with your health care provider. What should I know about hormone replacement therapy and supplements? Hormone therapy products are effective for  treating symptoms that are associated with menopause, such as hot flashes and night sweats. Hormone replacement carries certain risks, especially as you become older. If you are thinking about using estrogen or estrogen with progestin treatments, discuss the benefits and risks with your health care provider. What should I know about heart disease and stroke? Heart disease, heart attack, and stroke become more likely as you age. This may be due, in part, to the hormonal changes that your body experiences during menopause. These can affect how your body processes dietary fats, triglycerides, and cholesterol. Heart attack and stroke are both medical emergencies. There are many things that you can do to help prevent heart disease and stroke:  Have your blood pressure checked at least every 1-2 years. High blood pressure causes heart disease and increases the risk of stroke.  If you are 33-18 years old, ask your health care provider if you should take aspirin to prevent a heart attack or a stroke.  Do not use any tobacco products, including cigarettes, chewing tobacco, or electronic cigarettes. If you need help quitting, ask your health care provider.  It is important to eat a healthy diet and maintain a healthy weight. ? Be sure to include plenty of vegetables, fruits, low-fat dairy products, and lean protein. ? Avoid eating foods that are high in solid fats, added sugars, or salt (sodium).  Get regular exercise. This is one of the most important things that you can do for your health. ? Try to exercise for at least 150 minutes  each week. The type of exercise that you do should increase your heart rate and make you sweat. This is known as moderate-intensity exercise. ? Try to do strengthening exercises at least twice each week. Do these in addition to the moderate-intensity exercise.  Know your numbers.Ask your health care provider to check your cholesterol and your blood glucose. Continue to have  your blood tested as directed by your health care provider.  What should I know about cancer screening? There are several types of cancer. Take the following steps to reduce your risk and to catch any cancer development as early as possible. Breast Cancer  Practice breast self-awareness. ? This means understanding how your breasts normally appear and feel. ? It also means doing regular breast self-exams. Let your health care provider know about any changes, no matter how small.  If you are 40 or older, have a clinician do a breast exam (clinical breast exam or CBE) every year. Depending on your age, family history, and medical history, it may be recommended that you also have a yearly breast X-ray (mammogram).  If you have a family history of breast cancer, talk with your health care provider about genetic screening.  If you are at high risk for breast cancer, talk with your health care provider about having an MRI and a mammogram every year.  Breast cancer (BRCA) gene test is recommended for women who have family members with BRCA-related cancers. Results of the assessment will determine the need for genetic counseling and BRCA1 and for BRCA2 testing. BRCA-related cancers include these types: ? Breast. This occurs in males or females. ? Ovarian. ? Tubal. This may also be called fallopian tube cancer. ? Cancer of the abdominal or pelvic lining (peritoneal cancer). ? Prostate. ? Pancreatic.  Cervical, Uterine, and Ovarian Cancer Your health care provider may recommend that you be screened regularly for cancer of the pelvic organs. These include your ovaries, uterus, and vagina. This screening involves a pelvic exam, which includes checking for microscopic changes to the surface of your cervix (Pap test).  For women ages 21-65, health care providers may recommend a pelvic exam and a Pap test every three years. For women ages 30-65, they may recommend the Pap test and pelvic exam, combined  with testing for human papilloma virus (HPV), every five years. Some types of HPV increase your risk of cervical cancer. Testing for HPV may also be done on women of any age who have unclear Pap test results.  Other health care providers may not recommend any screening for nonpregnant women who are considered low risk for pelvic cancer and have no symptoms. Ask your health care provider if a screening pelvic exam is right for you.  If you have had past treatment for cervical cancer or a condition that could lead to cancer, you need Pap tests and screening for cancer for at least 20 years after your treatment. If Pap tests have been discontinued for you, your risk factors (such as having a new sexual partner) need to be reassessed to determine if you should start having screenings again. Some women have medical problems that increase the chance of getting cervical cancer. In these cases, your health care provider may recommend that you have screening and Pap tests more often.  If you have a family history of uterine cancer or ovarian cancer, talk with your health care provider about genetic screening.  If you have vaginal bleeding after reaching menopause, tell your health care provider.  There are   currently no reliable tests available to screen for ovarian cancer.  Lung Cancer Lung cancer screening is recommended for adults 55-80 years old who are at high risk for lung cancer because of a history of smoking. A yearly low-dose CT scan of the lungs is recommended if you:  Currently smoke.  Have a history of at least 30 pack-years of smoking and you currently smoke or have quit within the past 15 years. A pack-year is smoking an average of one pack of cigarettes per day for one year.  Yearly screening should:  Continue until it has been 15 years since you quit.  Stop if you develop a health problem that would prevent you from having lung cancer treatment.  Colorectal Cancer  This type of  cancer can be detected and can often be prevented.  Routine colorectal cancer screening usually begins at age 50 and continues through age 75.  If you have risk factors for colon cancer, your health care provider may recommend that you be screened at an earlier age.  If you have a family history of colorectal cancer, talk with your health care provider about genetic screening.  Your health care provider may also recommend using home test kits to check for hidden blood in your stool.  A small camera at the end of a tube can be used to examine your colon directly (sigmoidoscopy or colonoscopy). This is done to check for the earliest forms of colorectal cancer.  Direct examination of the colon should be repeated every 5-10 years until age 75. However, if early forms of precancerous polyps or small growths are found or if you have a family history or genetic risk for colorectal cancer, you may need to be screened more often.  Skin Cancer  Check your skin from head to toe regularly.  Monitor any moles. Be sure to tell your health care provider: ? About any new moles or changes in moles, especially if there is a change in a mole's shape or color. ? If you have a mole that is larger than the size of a pencil eraser.  If any of your family members has a history of skin cancer, especially at a young age, talk with your health care provider about genetic screening.  Always use sunscreen. Apply sunscreen liberally and repeatedly throughout the day.  Whenever you are outside, protect yourself by wearing long sleeves, pants, a wide-brimmed hat, and sunglasses.  What should I know about osteoporosis? Osteoporosis is a condition in which bone destruction happens more quickly than new bone creation. After menopause, you may be at an increased risk for osteoporosis. To help prevent osteoporosis or the bone fractures that can happen because of osteoporosis, the following is recommended:  If you are  19-50 years old, get at least 1,000 mg of calcium and at least 600 mg of vitamin D per day.  If you are older than age 50 but younger than age 70, get at least 1,200 mg of calcium and at least 600 mg of vitamin D per day.  If you are older than age 70, get at least 1,200 mg of calcium and at least 800 mg of vitamin D per day.  Smoking and excessive alcohol intake increase the risk of osteoporosis. Eat foods that are rich in calcium and vitamin D, and do weight-bearing exercises several times each week as directed by your health care provider. What should I know about how menopause affects my mental health? Depression may occur at any age, but   it is more common as you become older. Common symptoms of depression include:  Low or sad mood.  Changes in sleep patterns.  Changes in appetite or eating patterns.  Feeling an overall lack of motivation or enjoyment of activities that you previously enjoyed.  Frequent crying spells.  Talk with your health care provider if you think that you are experiencing depression. What should I know about immunizations? It is important that you get and maintain your immunizations. These include:  Tetanus, diphtheria, and pertussis (Tdap) booster vaccine.  Influenza every year before the flu season begins.  Pneumonia vaccine.  Shingles vaccine.  Your health care provider may also recommend other immunizations. This information is not intended to replace advice given to you by your health care provider. Make sure you discuss any questions you have with your health care provider. Document Released: 02/12/2005 Document Revised: 07/11/2015 Document Reviewed: 09/24/2014 Elsevier Interactive Patient Education  2018 Elsevier Inc.  

## 2017-10-27 ENCOUNTER — Encounter: Payer: Self-pay | Admitting: Family Medicine

## 2017-10-27 NOTE — Progress Notes (Signed)
[place]/thx dmf

## 2017-11-10 ENCOUNTER — Telehealth: Payer: Self-pay | Admitting: Family Medicine

## 2017-11-10 DIAGNOSIS — R05 Cough: Secondary | ICD-10-CM | POA: Diagnosis not present

## 2017-11-10 DIAGNOSIS — M6281 Muscle weakness (generalized): Secondary | ICD-10-CM | POA: Diagnosis not present

## 2017-11-10 DIAGNOSIS — R509 Fever, unspecified: Secondary | ICD-10-CM | POA: Diagnosis not present

## 2017-11-10 NOTE — Telephone Encounter (Signed)
Copied from [place] [phone]. Topic: General - Other >> Nov 10, 2017  9:26 AM [place] D wrote: Reason for CRM: Patient's nurse Manuela Schwartz from [facility] reporting that pt has deep, loose rattling cough. Her temperature has not elevated. It is 99.8 but she is weak and did have a fall last night. Manuela Schwartz would like to know if Dr. Deborra Medina would like for them to do a chest x ray. Please advise Cb# Manuela Schwartz [phone]

## 2017-11-10 NOTE — Telephone Encounter (Signed)
Spoke with Susan-informed of order per Dr. Deborra Medina. Will call with results.

## 2017-11-10 NOTE — Telephone Encounter (Signed)
Yes please do have them order a CXR.

## 2017-11-14 DIAGNOSIS — E441 Mild protein-calorie malnutrition: Secondary | ICD-10-CM | POA: Diagnosis not present

## 2017-11-14 DIAGNOSIS — I10 Essential (primary) hypertension: Secondary | ICD-10-CM | POA: Diagnosis not present

## 2017-11-14 DIAGNOSIS — I471 Supraventricular tachycardia: Secondary | ICD-10-CM | POA: Diagnosis not present

## 2017-11-14 DIAGNOSIS — G301 Alzheimer's disease with late onset: Secondary | ICD-10-CM | POA: Diagnosis not present

## 2017-11-14 DIAGNOSIS — N183 Chronic kidney disease, stage 3 (moderate): Secondary | ICD-10-CM | POA: Diagnosis not present

## 2017-11-22 DIAGNOSIS — I2510 Atherosclerotic heart disease of native coronary artery without angina pectoris: Secondary | ICD-10-CM | POA: Diagnosis not present

## 2017-11-22 DIAGNOSIS — R2689 Other abnormalities of gait and mobility: Secondary | ICD-10-CM | POA: Diagnosis not present

## 2017-11-22 DIAGNOSIS — M6281 Muscle weakness (generalized): Secondary | ICD-10-CM | POA: Diagnosis not present

## 2017-11-22 DIAGNOSIS — R278 Other lack of coordination: Secondary | ICD-10-CM | POA: Diagnosis not present

## 2017-11-25 DIAGNOSIS — I2510 Atherosclerotic heart disease of native coronary artery without angina pectoris: Secondary | ICD-10-CM | POA: Diagnosis not present

## 2017-11-25 DIAGNOSIS — R2689 Other abnormalities of gait and mobility: Secondary | ICD-10-CM | POA: Diagnosis not present

## 2017-11-25 DIAGNOSIS — R278 Other lack of coordination: Secondary | ICD-10-CM | POA: Diagnosis not present

## 2017-11-25 DIAGNOSIS — M6281 Muscle weakness (generalized): Secondary | ICD-10-CM | POA: Diagnosis not present

## 2017-11-30 DIAGNOSIS — R2689 Other abnormalities of gait and mobility: Secondary | ICD-10-CM | POA: Diagnosis not present

## 2017-11-30 DIAGNOSIS — R278 Other lack of coordination: Secondary | ICD-10-CM | POA: Diagnosis not present

## 2017-11-30 DIAGNOSIS — M6281 Muscle weakness (generalized): Secondary | ICD-10-CM | POA: Diagnosis not present

## 2017-11-30 DIAGNOSIS — I2510 Atherosclerotic heart disease of native coronary artery without angina pectoris: Secondary | ICD-10-CM | POA: Diagnosis not present

## 2017-12-05 DIAGNOSIS — M6281 Muscle weakness (generalized): Secondary | ICD-10-CM | POA: Diagnosis not present

## 2017-12-05 DIAGNOSIS — R296 Repeated falls: Secondary | ICD-10-CM | POA: Diagnosis not present

## 2017-12-05 DIAGNOSIS — R278 Other lack of coordination: Secondary | ICD-10-CM | POA: Diagnosis not present

## 2017-12-05 DIAGNOSIS — R2689 Other abnormalities of gait and mobility: Secondary | ICD-10-CM | POA: Diagnosis not present

## 2017-12-06 DIAGNOSIS — M6281 Muscle weakness (generalized): Secondary | ICD-10-CM | POA: Diagnosis not present

## 2017-12-06 DIAGNOSIS — R2689 Other abnormalities of gait and mobility: Secondary | ICD-10-CM | POA: Diagnosis not present

## 2017-12-06 DIAGNOSIS — R296 Repeated falls: Secondary | ICD-10-CM | POA: Diagnosis not present

## 2017-12-06 DIAGNOSIS — R278 Other lack of coordination: Secondary | ICD-10-CM | POA: Diagnosis not present

## 2017-12-07 DIAGNOSIS — R2689 Other abnormalities of gait and mobility: Secondary | ICD-10-CM | POA: Diagnosis not present

## 2017-12-07 DIAGNOSIS — R296 Repeated falls: Secondary | ICD-10-CM | POA: Diagnosis not present

## 2017-12-07 DIAGNOSIS — M6281 Muscle weakness (generalized): Secondary | ICD-10-CM | POA: Diagnosis not present

## 2017-12-07 DIAGNOSIS — R278 Other lack of coordination: Secondary | ICD-10-CM | POA: Diagnosis not present

## 2017-12-08 DIAGNOSIS — R296 Repeated falls: Secondary | ICD-10-CM | POA: Diagnosis not present

## 2017-12-08 DIAGNOSIS — M6281 Muscle weakness (generalized): Secondary | ICD-10-CM | POA: Diagnosis not present

## 2017-12-08 DIAGNOSIS — R2689 Other abnormalities of gait and mobility: Secondary | ICD-10-CM | POA: Diagnosis not present

## 2017-12-08 DIAGNOSIS — R278 Other lack of coordination: Secondary | ICD-10-CM | POA: Diagnosis not present

## 2017-12-14 ENCOUNTER — Encounter: Payer: Self-pay | Admitting: Internal Medicine

## 2017-12-14 DIAGNOSIS — M6281 Muscle weakness (generalized): Secondary | ICD-10-CM | POA: Diagnosis not present

## 2017-12-14 DIAGNOSIS — R278 Other lack of coordination: Secondary | ICD-10-CM | POA: Diagnosis not present

## 2017-12-14 DIAGNOSIS — R296 Repeated falls: Secondary | ICD-10-CM | POA: Diagnosis not present

## 2017-12-14 DIAGNOSIS — R2689 Other abnormalities of gait and mobility: Secondary | ICD-10-CM | POA: Diagnosis not present

## 2017-12-14 NOTE — Telephone Encounter (Signed)
Hey. I got this message from mychart, but I am not familiar with this patient at all.

## 2017-12-15 ENCOUNTER — Telehealth: Payer: Self-pay | Admitting: Internal Medicine

## 2017-12-15 NOTE — Telephone Encounter (Signed)
Mailed signed CMN for [facility] to [facility]. Beth

## 2017-12-19 DIAGNOSIS — M6281 Muscle weakness (generalized): Secondary | ICD-10-CM | POA: Diagnosis not present

## 2017-12-19 DIAGNOSIS — R296 Repeated falls: Secondary | ICD-10-CM | POA: Diagnosis not present

## 2017-12-19 DIAGNOSIS — R2689 Other abnormalities of gait and mobility: Secondary | ICD-10-CM | POA: Diagnosis not present

## 2017-12-19 DIAGNOSIS — R278 Other lack of coordination: Secondary | ICD-10-CM | POA: Diagnosis not present

## 2018-01-30 ENCOUNTER — Other Ambulatory Visit: Payer: Self-pay

## 2018-01-30 ENCOUNTER — Emergency Department
Admission: EM | Admit: 2018-01-30 | Discharge: 2018-01-31 | Disposition: A | Discharge status: Skilled Nursing Facility | Payer: Medicare Other | Attending: Emergency Medicine | Admitting: Emergency Medicine

## 2018-01-30 DIAGNOSIS — F0390 Unspecified dementia without behavioral disturbance: Secondary | ICD-10-CM | POA: Diagnosis not present

## 2018-01-30 DIAGNOSIS — I5032 Chronic diastolic (congestive) heart failure: Secondary | ICD-10-CM | POA: Diagnosis not present

## 2018-01-30 DIAGNOSIS — Y939 Activity, unspecified: Secondary | ICD-10-CM | POA: Insufficient documentation

## 2018-01-30 DIAGNOSIS — Y999 Unspecified external cause status: Secondary | ICD-10-CM | POA: Diagnosis not present

## 2018-01-30 DIAGNOSIS — S42201A Unspecified fracture of upper end of right humerus, initial encounter for closed fracture: Secondary | ICD-10-CM

## 2018-01-30 DIAGNOSIS — I10 Essential (primary) hypertension: Secondary | ICD-10-CM | POA: Diagnosis not present

## 2018-01-30 DIAGNOSIS — N183 Chronic kidney disease, stage 3 (moderate): Secondary | ICD-10-CM | POA: Diagnosis not present

## 2018-01-30 DIAGNOSIS — Z79899 Other long term (current) drug therapy: Secondary | ICD-10-CM | POA: Diagnosis not present

## 2018-01-30 DIAGNOSIS — I2510 Atherosclerotic heart disease of native coronary artery without angina pectoris: Secondary | ICD-10-CM | POA: Diagnosis not present

## 2018-01-30 DIAGNOSIS — W19XXXA Unspecified fall, initial encounter: Secondary | ICD-10-CM | POA: Insufficient documentation

## 2018-01-30 DIAGNOSIS — S4991XA Unspecified injury of right shoulder and upper arm, initial encounter: Secondary | ICD-10-CM | POA: Diagnosis present

## 2018-01-30 DIAGNOSIS — J449 Chronic obstructive pulmonary disease, unspecified: Secondary | ICD-10-CM | POA: Diagnosis not present

## 2018-01-30 DIAGNOSIS — I130 Hypertensive heart and chronic kidney disease with heart failure and stage 1 through stage 4 chronic kidney disease, or unspecified chronic kidney disease: Secondary | ICD-10-CM | POA: Insufficient documentation

## 2018-01-30 DIAGNOSIS — G309 Alzheimer's disease, unspecified: Secondary | ICD-10-CM | POA: Insufficient documentation

## 2018-01-30 DIAGNOSIS — Y92129 Unspecified place in nursing home as the place of occurrence of the external cause: Secondary | ICD-10-CM | POA: Diagnosis not present

## 2018-01-30 DIAGNOSIS — S42291A Other displaced fracture of upper end of right humerus, initial encounter for closed fracture: Secondary | ICD-10-CM | POA: Diagnosis not present

## 2018-01-30 DIAGNOSIS — S42351A Displaced comminuted fracture of shaft of humerus, right arm, initial encounter for closed fracture: Secondary | ICD-10-CM | POA: Diagnosis not present

## 2018-01-30 MED ORDER — ONDANSETRON HCL 4 MG/2ML IJ SOLN
INTRAMUSCULAR | Status: AC
  Filled 2018-01-30: qty 2

## 2018-01-30 MED ORDER — FENTANYL CITRATE (PF) 100 MCG/2ML IJ SOLN
INTRAMUSCULAR | Status: AC
  Filled 2018-01-30: qty 2

## 2018-01-30 NOTE — ED Provider Notes (Signed)
[facility] Emergency Department Provider Note  ____________________________________________   First MD Initiated Contact with Patient 01/30/18 2328     (approximate)  I have reviewed the triage vital signs and the nursing notes.   HISTORY  Chief Complaint Fall  Level 5 caveat:  history/ROS limited by chronic dementia.  History is provided primarily by her daughter who is at bedside.  HPI Joy [name] is a 85 y.o. female with extensive past medical history as listed below which notably includes COPD on 2 L of oxygen at baseline as well as (presumably) Alzheimer's dementia.  She presents by EMS tonight from [facility] memory care unit for evaluation of an unwitnessed fall and pain in her right shoulder.  She states that she was trying to transfer from her chair to her bed when she lost her balance and fell onto her right arm.  Her daughter says that she has frequent falls and always falls to the right.  The patient has severe pain with any movement of the right arm although the pain is mild at rest and with no movement.  She has no numbness nor tingling down in her hand and can wiggle her fingers although that does reproduce some pain.  She denies headache, neck pain, chest pain, shortness of breath, vomiting, and abdominal pain.  She has some nausea which she believes may be from the pain.  She has no pain in her legs and she uses a walker at baseline.  Past Medical History:  Diagnosis Date  . Alzheimer disease ([place])   . Anxiety   . CAD (coronary artery disease)    h/o NSTEMI, LAD stent placement 10/04, cath 05/11/2010  . Carotid artery occlusion   . COPD (chronic obstructive pulmonary disease) ([place])   . Depression   . Diastolic dysfunction    Echo 05/11/2010, EF 65-70%  . HLD (hyperlipidemia)   . HTN (hypertension)   . Hx of MELAS    MELAS CARRIER  . Hypertension   . Kidney infection   . PMR (polymyalgia rheumatica) (HCC)    with h/o steroid induced  myopathy  . Thyroid disease   . TIA (transient ischemic attack)    11/05 s/p L CEA    Patient Active Problem List   Diagnosis Date Noted  . Alzheimer's dementia without behavioral disturbance ([place])   . Palliative care by specialist   . DNR (do not resuscitate)   . Goals of care, counseling/discussion   . Gabapentin-induced toxicity 04/04/2016  . Allergic rhinitis 11/04/2014  . Chronic diastolic heart failure ([place]) 07/22/2014  . Flash pulmonary edema ([place]) 06/30/2014  . CKD (chronic kidney disease) stage 3, GFR 30-59 ml/min (HCC) 06/11/2014  . Carotid artery disease ([place]) 04/08/2014  . CAD (coronary artery disease)   . HLD (hyperlipidemia)   . Lung nodule 01/15/2013  . Weakness generalized 12/26/2012  . Incontinence 12/26/2012  . GERD (gastroesophageal reflux disease) 09/06/2012  . Neuropathy 01/14/2011  . Insomnia 01/14/2011  . 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 HTN (hypertension)   . COPD (chronic obstructive pulmonary disease) ([place])   . Depression   . Thyroid disease   . Cryoglobulinemia ([place]) 07/18/2010  . Essential tremor 07/18/2010  . Open-angle glaucoma 07/18/2010  . Other specified forms of chronic ischemic heart disease 07/18/2010  . Renal cyst 07/18/2010  . Senile osteoporosis 07/18/2010  . Urge incontinence 07/18/2010    Past Surgical History:  Procedure Laterality Date  . CARDIAC CATHETERIZATION  [facility]  . CARDIAC CATHETERIZATION    . CARDIAC CATHETERIZATION    . CAROTID ENDARTERECTOMY     left with stent placement 11/2003  . ILIAC ARTERY STENT     right, 2001  . LUNG BIOPSY      Prior to Admission medications   Medication Sig Start Date End Date Taking? Authorizing Provider  acetaminophen (TYLENOL) 650 MG CR tablet Take 650 mg by mouth every 8 (eight) hours as needed for pain.    [provider]  ADVAIR DISKUS 250-50 MCG/DOSE AEPB INHALE 1 PUFF BY MOUTH TWICE A DAY 09/29/15    Lucille Passy, MD  albuterol (PROVENTIL HFA;VENTOLIN HFA) 108 (90 Base) MCG/ACT inhaler Inhale 2 puffs into the lungs every 6 (six) hours as needed for wheezing or shortness of breath. 04/17/15   Joanne Gavel, MD  amLODipine (NORVASC) 5 MG tablet Take 1 tablet (5 mg total) by mouth daily. 10/18/17   Lucille Passy, MD  atorvastatin (LIPITOR) 20 MG tablet Take 20 mg by mouth daily.    [provider]  buPROPion (WELLBUTRIN XL) 150 MG 24 hr tablet Take 150 mg by mouth daily.    [provider]  clopidogrel (PLAVIX) 75 MG tablet Take 1 tablet (75 mg total) by mouth daily. 05/22/15   Lucille Passy, MD  docusate sodium (COLACE) 100 MG capsule Take 1 tablet once or twice daily as needed for constipation while taking narcotic pain medicine 01/31/18   Hinda Kehr, MD  HYDROcodone-acetaminophen (NORCO/VICODIN) 5-325 MG tablet Take 2 tablets by mouth every 6 (six) hours as needed for up to 7 days for severe pain. 01/31/18 02/07/18  Hinda Kehr, MD  ipratropium-albuterol (DUONEB) 0.5-2.5 (3) MG/3ML SOLN Take 3 mLs by nebulization every 4 (four) hours as needed (Wheezing/Dyspnea).    [provider]  levothyroxine (SYNTHROID, LEVOTHROID) 25 MCG tablet Take 25 mcg by mouth daily before breakfast.    [provider]  lisinopril (PRINIVIL,ZESTRIL) 10 MG tablet Take 1 tablet (10 mg total) by mouth daily. 07/15/15   Wellington Hampshire, MD  memantine (NAMENDA) 10 MG tablet Take 10 mg by mouth 2 (two) times daily.    [provider]  nitroGLYCERIN (NITROSTAT) 0.4 MG SL tablet Place 0.4 mg under the tongue every 5 (five) minutes as needed.      [provider]  pantoprazole (PROTONIX) 40 MG tablet Take 40 mg by mouth daily.    [provider]  polyethylene glycol (MIRALAX / GLYCOLAX) packet Take 17 g by mouth daily.    [provider]  sertraline (ZOLOFT) 100 MG tablet TAKE 1 TABLET BY MOUTH EVERY DAY Patient taking differently: TAKE 1 1/2 TABLET BY  MOUTH EVERY DAY 10/03/15   Lucille Passy, MD  SPIRIVA HANDIHALER 18 MCG inhalation capsule PLACE 1 CAPSULE (18 MCG TOTAL) INTO INHALER AND INHALE DAILY. 06/03/15   Lucille Passy, MD  traMADol (ULTRAM) 50 MG tablet Take 1 tablet (50 mg total) by mouth every 12 (twelve) hours as needed. 04/21/17   Libby Maw, MD  traZODone (DESYREL) 50 MG tablet Take 25-50 mg by mouth at bedtime as needed for sleep.     [provider]  vitamin B-12 (CYANOCOBALAMIN) 1000 MCG tablet Take 1,000 mcg by mouth daily.    [provider]  Vitamin D, Ergocalciferol, (DRISDOL) 50000 units CAPS capsule TAKE 1 CAPSULE BY MOUTH EVERY 7 DAYS 11/26/14   [provider]    Allergies Prednisone and Shellfish allergy  Family  History  Problem Relation Age of Onset  . Arthritis Mother   . Arthritis Father   . Glaucoma Father   . Alzheimer's disease Brother   . Heart disease Brother   . Arthritis Brother   . Heart disease Brother   . Arthritis Brother   . Glaucoma Brother     Social History Social History   Tobacco Use  . Smoking status: Former Smoker    Packs/day: 1.00    Years: 25.00    Pack years: 25.00    Types: Cigarettes    Last attempt to quit: 01/05/1996    Years since quitting: 22.0  . Smokeless tobacco: Never Used  Substance Use Topics  . Alcohol use: No  . Drug use: No    Review of Systems Level 5 caveat:  history/ROS limited by acute/critical illness  Constitutional: No fever/chills Eyes: No visual changes. ENT: No sore throat. Cardiovascular: Denies chest pain. Respiratory: Denies shortness of breath. Gastrointestinal: No abdominal pain.  No nausea, no vomiting.  No diarrhea.  No constipation. Genitourinary: Negative for dysuria. Musculoskeletal: Pain in right shoulder as described above.  Negative for neck pain.  Negative for back pain. Integumentary: Negative for rash. Neurological: Negative for headaches, focal weakness or  numbness.   ____________________________________________   PHYSICAL EXAM:  VITAL SIGNS: ED Triage Vitals  Enc Vitals Group     BP 01/30/18 2317 (!) 223/66     Pulse Rate 01/30/18 2317 77     Resp 01/30/18 2317 20     Temp 01/30/18 2317 98.2 F (36.8 C)     Temp Source 01/30/18 2317 Oral     SpO2 01/30/18 2317 90 %     Weight 01/30/18 2314 72.8 kg (160 lb 7.9 oz)     Height --      Head Circumference --      Peak Flow --      Pain Score 01/30/18 2314 9     Pain Loc --      Pain Edu? --      Excl. in [place]? --     Constitutional: Alert and oriented to person, situation, and location.  No acute distress while at rest.  Appears chronically ill and elderly. Eyes: Conjunctivae are normal.  Head: Atraumatic. Nose: No congestion/rhinnorhea. Mouth/Throat: Mucous membranes are moist. Neck: No stridor.  No meningeal signs.   Cardiovascular: Normal rate, regular rhythm. Good peripheral circulation. Grossly normal heart sounds. Respiratory: Normal respiratory effort.  No retractions. Lungs CTAB.  Using her chronic 2 L of oxygen by nasal cannula. Gastrointestinal: Soft and nontender. No distention.  Musculoskeletal: Some swelling of the right upper arm.  Severe pain/tenderness with any movement of the right arm.  Neurovascularly intact including a easily palpable radial pulse.  Normal capillary refill in the affected arm.  No acute abnormalities in the left upper extremity.  I passively ranged her lower extremities and there was no reproducible pain or tenderness. Neurologic:  Normal speech and language. No gross focal neurologic deficits are appreciated.  Skin:  Skin is warm, dry and intact. No rash noted.  ____________________________________________   LABS (all labs ordered are listed, but only abnormal results are displayed)  Labs Reviewed  CBC WITH DIFFERENTIAL/PLATELET - Abnormal; Notable for the following components:      Result Value   RBC 3.39 (*)    Hemoglobin 11.0 (*)     HCT 33.3 (*)    RDW 15.8 (*)    Lymphs Abs 0.3 (*)    All  other components within normal limits  COMPREHENSIVE METABOLIC PANEL - Abnormal; Notable for the following components:   Glucose, Bld 164 (*)    BUN 47 (*)    Creatinine, Ser 1.34 (*)    Calcium 8.3 (*)    Total Protein 6.4 (*)    GFR calc non Af Amer 36 (*)    GFR calc Af Amer 42 (*)    All other components within normal limits  TROPONIN I   ____________________________________________  EKG  ED ECG REPORT I, Hinda Kehr, the attending physician, personally viewed and interpreted this ECG.  Date: 01/30/2018 EKG Time: 23: 31 Rate: 76 Rhythm: normal sinus rhythm QRS Axis: normal Intervals: normal ST/T Wave abnormalities: Non-specific ST segment / T-wave changes, but no clear evidence of acute ischemia. Narrative Interpretation: no definitive evidence of acute ischemia; does not meet STEMI criteria.   ____________________________________________  RADIOLOGY I, Hinda Kehr, personally viewed and evaluated these images (plain radiographs) as part of my medical decision making, as well as reviewing the written report by the radiologist.  ED MD interpretation: Displaced proximal humerus fracture on the right side  Official radiology report(s): Dg Shoulder Right  Result Date: 01/31/2018 CLINICAL DATA:  Unwitnessed fall trying to get out of chair. Right shoulder pain. EXAM: RIGHT SHOULDER - 2+ VIEW COMPARISON:  Radiograph 02/10/2015 FINDINGS: Transverse fracture through the proximal humeral shaft has 1 shaft width medial displacement of distal fracture fragment. No intra-articular extension. Glenohumeral alignment is maintained. Mild acromioclavicular degenerative change. IMPRESSION: Displaced proximal humeral shaft fracture. Electronically Signed   By: Keith Rake M.D.   On: 01/31/2018 00:25    ____________________________________________   PROCEDURES  Critical Care performed: No   Procedure(s) performed:    Procedures   ____________________________________________   INITIAL IMPRESSION / ASSESSMENT AND PLAN / ED COURSE  As part of my medical decision making, I reviewed the following data within the [place] History obtained from family, Nursing notes reviewed and incorporated, Labs reviewed , EKG interpreted , Old chart reviewed, Radiograph reviewed , Discussed with orthopedics (Dr. Marry Guan), Notes from prior ED visits and [place] Controlled Substance Database    Differential diagnosis includes, but is not limited to, shoulder dislocation, shoulder or humerus fracture, head injury, neck injury.  The patient had mechanical fall and unfortunately her radiographs demonstrate a closed proximal humerus fracture on the right with some medial displacement.  She is neurovascularly intact, however, and her pain is relatively well controlled when she is not moving.  I discussed the case at about 12:55 AM with Dr. Marry Guan.  He reviewed the images and felt that this would most likely be best managed nonoperatively.  He recommended either shoulder immobilizer or sling based on patient comfort and follow-up at the beginning of next week in clinic.  I have updated the patient and her daughter about the plan and they both agree.  Initially the patient was severely hypertensive upon arrival but I think this was due to pain and the stress of the transport.  She was 223/66 initially but after getting a dose of fentanyl 25 mcg IV and after settling in, her blood pressure has come down to 134/86.  Her daughter agrees that no other treatment would be indicated or necessary.  Comprehensive metabolic panel and CBC and troponin are all within normal limits.  No indication for urinalysis.      ____________________________________________  FINAL CLINICAL IMPRESSION(S) / ED DIAGNOSES  Final diagnoses:  Closed traumatic displaced fracture of proximal end of right  humerus, initial encounter     MEDICATIONS  GIVEN DURING THIS VISIT:  Medications  fentaNYL (SUBLIMAZE) injection 25 mcg (25 mcg Intravenous Given 01/31/18 0003)  ondansetron (ZOFRAN) injection 4 mg (4 mg Intravenous Given 01/31/18 0003)  oxyCODONE-acetaminophen (PERCOCET/ROXICET) 5-325 MG per tablet 2 tablet (2 tablets Oral Given 01/31/18 0051)     ED Discharge Orders         Ordered    HYDROcodone-acetaminophen (NORCO/VICODIN) 5-325 MG tablet  Every 6 hours PRN     01/31/18 0112    docusate sodium (COLACE) 100 MG capsule     01/31/18 0112           Note:  This document was prepared using Dragon voice recognition software and may include unintentional dictation errors.   Hinda Kehr, MD 01/31/18 [phone]

## 2018-01-30 NOTE — ED Triage Notes (Signed)
Pt brought in by [facility] from [facility] for unwitnessed fall. States she was trying to get out of chair, unsure of head injury. Co right shoulder pain, no deformity.

## 2018-01-31 ENCOUNTER — Emergency Department: Payer: Medicare Other

## 2018-01-31 ENCOUNTER — Encounter: Payer: Self-pay | Admitting: Family Medicine

## 2018-01-31 DIAGNOSIS — M25519 Pain in unspecified shoulder: Secondary | ICD-10-CM | POA: Diagnosis not present

## 2018-01-31 DIAGNOSIS — R410 Disorientation, unspecified: Secondary | ICD-10-CM | POA: Diagnosis not present

## 2018-01-31 DIAGNOSIS — S42201A Unspecified fracture of upper end of right humerus, initial encounter for closed fracture: Secondary | ICD-10-CM | POA: Diagnosis not present

## 2018-01-31 DIAGNOSIS — S0990XA Unspecified injury of head, initial encounter: Secondary | ICD-10-CM | POA: Diagnosis not present

## 2018-01-31 DIAGNOSIS — S42351A Displaced comminuted fracture of shaft of humerus, right arm, initial encounter for closed fracture: Secondary | ICD-10-CM | POA: Diagnosis not present

## 2018-01-31 DIAGNOSIS — I10 Essential (primary) hypertension: Secondary | ICD-10-CM | POA: Diagnosis not present

## 2018-01-31 LAB — CBC WITH DIFFERENTIAL/PLATELET
Abs Immature Granulocytes: 0.06 10*3/uL (ref 0.00–0.07)
Basophils Absolute: 0 10*3/uL (ref 0.0–0.1)
Basophils Relative: 0 %
Eosinophils Absolute: 0.1 10*3/uL (ref 0.0–0.5)
Eosinophils Relative: 1 %
HCT: 33.3 % — ABNORMAL LOW (ref 36.0–46.0)
Hemoglobin: 11 g/dL — ABNORMAL LOW (ref 12.0–15.0)
Immature Granulocytes: 1 %
Lymphocytes Relative: 4 %
Lymphs Abs: 0.3 10*3/uL — ABNORMAL LOW (ref 0.7–4.0)
MCH: 32.4 pg (ref 26.0–34.0)
MCHC: 33 g/dL (ref 30.0–36.0)
MCV: 98.2 fL (ref 80.0–100.0)
Monocytes Absolute: 0.6 10*3/uL (ref 0.1–1.0)
Monocytes Relative: 8 %
NRBC: 0 % (ref 0.0–0.2)
Neutro Abs: 6.6 10*3/uL (ref 1.7–7.7)
Neutrophils Relative %: 86 %
Platelets: 156 10*3/uL (ref 150–400)
RBC: 3.39 MIL/uL — ABNORMAL LOW (ref 3.87–5.11)
RDW: 15.8 % — ABNORMAL HIGH (ref 11.5–15.5)
WBC: 7.7 10*3/uL (ref 4.0–10.5)

## 2018-01-31 LAB — COMPREHENSIVE METABOLIC PANEL
ALT: 17 U/L (ref 0–44)
ANION GAP: 7 (ref 5–15)
AST: 24 U/L (ref 15–41)
Albumin: 3.7 g/dL (ref 3.5–5.0)
Alkaline Phosphatase: 113 U/L (ref 38–126)
BUN: 47 mg/dL — ABNORMAL HIGH (ref 8–23)
CHLORIDE: 104 mmol/L (ref 98–111)
CO2: 29 mmol/L (ref 22–32)
Calcium: 8.3 mg/dL — ABNORMAL LOW (ref 8.9–10.3)
Creatinine, Ser: 1.34 mg/dL — ABNORMAL HIGH (ref 0.44–1.00)
GFR calc Af Amer: 42 mL/min — ABNORMAL LOW (ref >60)
GFR calc non Af Amer: 36 mL/min — ABNORMAL LOW (ref >60)
Glucose, Bld: 164 mg/dL — ABNORMAL HIGH (ref 70–99)
POTASSIUM: 4.1 mmol/L (ref 3.5–5.1)
Sodium: 140 mmol/L (ref 135–145)
Total Bilirubin: 0.8 mg/dL (ref 0.3–1.2)
Total Protein: 6.4 g/dL — ABNORMAL LOW (ref 6.5–8.1)

## 2018-01-31 LAB — TROPONIN I

## 2018-01-31 MED ORDER — OXYCODONE-ACETAMINOPHEN 5-325 MG PO TABS
2.0000 | ORAL_TABLET | Freq: Once | ORAL | Status: AC
  Administered 2018-01-31: 2 via ORAL
  Filled 2018-01-31: qty 2

## 2018-01-31 MED ORDER — ONDANSETRON HCL 4 MG/2ML IJ SOLN
4.0000 mg | Freq: Once | INTRAMUSCULAR | Status: AC
  Administered 2018-01-31: 4 mg via INTRAVENOUS

## 2018-01-31 MED ORDER — FENTANYL CITRATE (PF) 100 MCG/2ML IJ SOLN
25.0000 ug | Freq: Once | INTRAMUSCULAR | Status: AC
  Administered 2018-01-31: 25 ug via INTRAVENOUS

## 2018-01-31 MED ORDER — ONDANSETRON 4 MG PO TBDP: ORAL_TABLET | ORAL | 0 refills | Status: DC

## 2018-01-31 MED ORDER — HYDROCODONE-ACETAMINOPHEN 5-325 MG PO TABS: 2.0000 | ORAL_TABLET | Freq: Four times a day (QID) | ORAL | 0 refills | Status: DC | PRN

## 2018-01-31 MED ORDER — DOCUSATE SODIUM 100 MG PO CAPS: ORAL_CAPSULE | ORAL | 0 refills | Status: DC

## 2018-01-31 NOTE — Discharge Instructions (Signed)
As we discussed, it is important that Joy [name] keep the shoulder immobilizer in place, or the bones will not be able to heal.  She might find that a reclined position is more comfortable.  She will need close follow-up both with orthopedics (preferably on Friday or Monday) as well as with her primary care doctor who can help guide whether or not she needs additional physical therapy or occupational therapy or durable medical equipment such as a wheelchair.  I have written her a prescription for Norco for additional pain control.  Please follow-up as recommended and  return to the emergency department if you develop new or worsening symptoms that concern you.

## 2018-02-01 DIAGNOSIS — R279 Unspecified lack of coordination: Secondary | ICD-10-CM | POA: Diagnosis not present

## 2018-02-01 DIAGNOSIS — Z743 Need for continuous supervision: Secondary | ICD-10-CM | POA: Diagnosis not present

## 2018-02-01 DIAGNOSIS — W19XXXA Unspecified fall, initial encounter: Secondary | ICD-10-CM | POA: Diagnosis not present

## 2018-02-01 DIAGNOSIS — R5381 Other malaise: Secondary | ICD-10-CM | POA: Diagnosis not present

## 2018-02-02 ENCOUNTER — Telehealth: Payer: Self-pay | Admitting: Family Medicine

## 2018-02-02 NOTE — Telephone Encounter (Signed)
Will send Rx for norco 10/325mg  0.5-1 tab po q6PRN pain #30 with 0RF. This should provide sufficient quantity until pt has appt with ortho on 02/08/18.   Does this get sent to the pharmacy? Directly to [facility]? Let me know and I will send. Thanks!

## 2018-02-02 NOTE — Telephone Encounter (Signed)
MC-Can you please see this in Dr. Hulen Shouts absence?  Pt was taken via EMS from [facility] on 1.27.20 after a fall/she was trying to get from chair to bed when fall occurred/she was Dx with Closed traumatic displaced Fx of proximal end of right humerus  She was scheduled with Ortho on 2.5.20 (soonest appt) and given the following:  1.) Ducosate 100mg  Take 1 tab up to bid #30 2.) Zofran 4mg  Take 1-2 tabs q8h prn #30 3.) Hydrocodone-APAP 5-325mg  Take 2 q6h prn #30  Manuela Schwartz from [facility] at [facility] [place] is requesting enough of the medication to last until seen at Ortho on 2.5.20 for severe pain as the amount given will not even last the weekend  The Hydrocodone if changed to 10-325 would get less tylenol and could be 1/2-1 q6h prn? Not sure what you would want to do/plz advise/thx dmf

## 2018-02-02 NOTE — Telephone Encounter (Signed)
Copied from [place] [id number]. Topic: Quick Communication - Rx Refill/Question >> Feb 02, 2018 10:00 AM Rayann Heman wrote: Medication: HYDROcodone-acetaminophen (NORCO/VICODIN) 5-325 MG tablet [[id number]] susan calling from memory care at [facility] called and stated that the patient fell on 01/30/18 and was prescribed this medication. Manuela Schwartz states that the pt can not get into see ortho until 02/08/18. Manuela Schwartz would like to know if she could get enough medication sent in until patient can see ortho. Pt was only given 30 pills at hospital. Please advise  [place], [place] [phone] (Phone) [phone] (Fax)  Significant History/Details

## 2018-02-03 ENCOUNTER — Other Ambulatory Visit: Payer: Self-pay

## 2018-02-03 MED ORDER — HYDROCODONE-ACETAMINOPHEN 10-325 MG PO TABS: ORAL_TABLET | ORAL | 0 refills | Status: DC

## 2018-02-03 NOTE — Progress Notes (Signed)
**Note e-Identified via Obfuscation** Printed Rx for [employer] 10-325/although they received it electronically they need a copy signed and faxed for their records/thx dmf

## 2018-02-03 NOTE — Telephone Encounter (Signed)
Joy [name] w/ [facility] memory care is calling in to follow up on refill request. I did advise that per the chart Rx was sent to pharmacy. Joy [name] stated that the pharmacy advised her that have not received a refill. Joy [name] would like to know if Rx could be faxed to: [phone] [facility]) and they will get it to the pharmacy for filling.

## 2018-02-08 DIAGNOSIS — S42201A Unspecified fracture of upper end of right humerus, initial encounter for closed fracture: Secondary | ICD-10-CM | POA: Diagnosis not present

## 2018-02-08 DIAGNOSIS — M25511 Pain in right shoulder: Secondary | ICD-10-CM | POA: Diagnosis not present

## 2018-02-08 DIAGNOSIS — W07XXXA Fall from chair, initial encounter: Secondary | ICD-10-CM | POA: Diagnosis not present

## 2018-02-15 DIAGNOSIS — R2681 Unsteadiness on feet: Secondary | ICD-10-CM | POA: Diagnosis not present

## 2018-02-20 DIAGNOSIS — R2681 Unsteadiness on feet: Secondary | ICD-10-CM | POA: Diagnosis not present

## 2018-02-22 ENCOUNTER — Other Ambulatory Visit: Payer: Self-pay

## 2018-02-22 NOTE — Progress Notes (Signed)
TA-Plz see refill request/LF: 1.31.20/per [place]MP pt is compliant no red flags/thx dmf

## 2018-02-23 ENCOUNTER — Telehealth: Payer: Self-pay

## 2018-02-23 MED ORDER — HYDROCODONE-ACETAMINOPHEN 10-325 MG PO TABS: ORAL_TABLET | ORAL | 0 refills | Status: DC

## 2018-02-23 NOTE — Telephone Encounter (Signed)
Made copy of signed CMN for advanced to pick up for recert O2. Beth

## 2018-02-24 DIAGNOSIS — R2681 Unsteadiness on feet: Secondary | ICD-10-CM | POA: Diagnosis not present

## 2018-02-27 DIAGNOSIS — R2681 Unsteadiness on feet: Secondary | ICD-10-CM | POA: Diagnosis not present

## 2018-02-28 DIAGNOSIS — R2681 Unsteadiness on feet: Secondary | ICD-10-CM | POA: Diagnosis not present

## 2018-03-01 DIAGNOSIS — R2681 Unsteadiness on feet: Secondary | ICD-10-CM | POA: Diagnosis not present

## 2018-03-03 DIAGNOSIS — R2681 Unsteadiness on feet: Secondary | ICD-10-CM | POA: Diagnosis not present

## 2018-03-06 DIAGNOSIS — M25511 Pain in right shoulder: Secondary | ICD-10-CM | POA: Diagnosis not present

## 2018-03-06 DIAGNOSIS — S42201A Unspecified fracture of upper end of right humerus, initial encounter for closed fracture: Secondary | ICD-10-CM | POA: Diagnosis not present

## 2018-03-07 DIAGNOSIS — R2681 Unsteadiness on feet: Secondary | ICD-10-CM | POA: Diagnosis not present

## 2018-03-21 ENCOUNTER — Encounter: Payer: Self-pay | Admitting: Family Medicine

## 2018-04-03 DIAGNOSIS — J439 Emphysema, unspecified: Secondary | ICD-10-CM | POA: Diagnosis not present

## 2018-04-03 DIAGNOSIS — R05 Cough: Secondary | ICD-10-CM | POA: Diagnosis not present

## 2018-04-04 ENCOUNTER — Telehealth: Payer: Self-pay | Admitting: Family Medicine

## 2018-04-04 DIAGNOSIS — M25511 Pain in right shoulder: Secondary | ICD-10-CM | POA: Diagnosis not present

## 2018-04-04 DIAGNOSIS — S42201D Unspecified fracture of upper end of right humerus, subsequent encounter for fracture with routine healing: Secondary | ICD-10-CM | POA: Diagnosis not present

## 2018-04-04 NOTE — Telephone Encounter (Signed)
Copied from [place] [id number]. Topic: Quick Communication - Home Health Verbal Orders >> Apr 04, 2018  6:19 PM Nils Flack wrote: Caller/Agency: [facility] nurse  [place] Number: [phone] Requesting OT/PT/Skilled Nursing/Social Work/Speech Therapy: needs order for urinalysis  Frequency: order for urinalysis Fax is [phone]

## 2018-04-05 NOTE — Telephone Encounter (Signed)
Peter with Memory Care called back concerning the order for the urinalysis. Wanting to receive the order so they can proceed with this task. Call back number is [phone].

## 2018-04-05 NOTE — Telephone Encounter (Signed)
Yes okay to give order

## 2018-04-05 NOTE — Telephone Encounter (Signed)
TA-Is it ok for me to create a letter for the requested for you to sign?plz advise/thx dmf

## 2018-04-06 DIAGNOSIS — S42201D Unspecified fracture of upper end of right humerus, subsequent encounter for fracture with routine healing: Secondary | ICD-10-CM | POA: Diagnosis not present

## 2018-04-06 DIAGNOSIS — R05 Cough: Secondary | ICD-10-CM | POA: Diagnosis not present

## 2018-04-07 DIAGNOSIS — J181 Lobar pneumonia, unspecified organism: Secondary | ICD-10-CM

## 2018-04-07 DIAGNOSIS — S42201D Unspecified fracture of upper end of right humerus, subsequent encounter for fracture with routine healing: Secondary | ICD-10-CM | POA: Diagnosis not present

## 2018-04-07 NOTE — Telephone Encounter (Signed)
Verbal given to Joy [name].  

## 2018-04-10 DIAGNOSIS — S42201D Unspecified fracture of upper end of right humerus, subsequent encounter for fracture with routine healing: Secondary | ICD-10-CM | POA: Diagnosis not present

## 2018-04-14 DIAGNOSIS — S42201D Unspecified fracture of upper end of right humerus, subsequent encounter for fracture with routine healing: Secondary | ICD-10-CM | POA: Diagnosis not present

## 2018-04-17 DIAGNOSIS — S42201D Unspecified fracture of upper end of right humerus, subsequent encounter for fracture with routine healing: Secondary | ICD-10-CM | POA: Diagnosis not present

## 2018-04-19 DIAGNOSIS — S42201D Unspecified fracture of upper end of right humerus, subsequent encounter for fracture with routine healing: Secondary | ICD-10-CM | POA: Diagnosis not present

## 2018-04-21 DIAGNOSIS — S42201D Unspecified fracture of upper end of right humerus, subsequent encounter for fracture with routine healing: Secondary | ICD-10-CM | POA: Diagnosis not present

## 2018-04-24 ENCOUNTER — Ambulatory Visit: Payer: Self-pay | Admitting: Internal Medicine

## 2018-04-24 DIAGNOSIS — S42201D Unspecified fracture of upper end of right humerus, subsequent encounter for fracture with routine healing: Secondary | ICD-10-CM | POA: Diagnosis not present

## 2018-04-26 DIAGNOSIS — S42201D Unspecified fracture of upper end of right humerus, subsequent encounter for fracture with routine healing: Secondary | ICD-10-CM | POA: Diagnosis not present

## 2018-04-27 DIAGNOSIS — S42201D Unspecified fracture of upper end of right humerus, subsequent encounter for fracture with routine healing: Secondary | ICD-10-CM | POA: Diagnosis not present

## 2018-04-28 DIAGNOSIS — S42201D Unspecified fracture of upper end of right humerus, subsequent encounter for fracture with routine healing: Secondary | ICD-10-CM | POA: Diagnosis not present

## 2018-05-03 DIAGNOSIS — S42201D Unspecified fracture of upper end of right humerus, subsequent encounter for fracture with routine healing: Secondary | ICD-10-CM | POA: Diagnosis not present

## 2018-05-04 DIAGNOSIS — S42201D Unspecified fracture of upper end of right humerus, subsequent encounter for fracture with routine healing: Secondary | ICD-10-CM | POA: Diagnosis not present

## 2018-05-08 DIAGNOSIS — S42201D Unspecified fracture of upper end of right humerus, subsequent encounter for fracture with routine healing: Secondary | ICD-10-CM | POA: Diagnosis not present

## 2018-05-09 DIAGNOSIS — S42201D Unspecified fracture of upper end of right humerus, subsequent encounter for fracture with routine healing: Secondary | ICD-10-CM | POA: Diagnosis not present

## 2018-05-11 DIAGNOSIS — S42201D Unspecified fracture of upper end of right humerus, subsequent encounter for fracture with routine healing: Secondary | ICD-10-CM | POA: Diagnosis not present

## 2018-05-22 ENCOUNTER — Ambulatory Visit: Payer: Self-pay | Admitting: Internal Medicine

## 2018-05-30 ENCOUNTER — Telehealth: Payer: Self-pay

## 2018-05-30 NOTE — Telephone Encounter (Signed)
Called patient from recall.  No answer. LMOV.  This is the first attempt per recall list.

## 2018-08-01 DIAGNOSIS — B342 Coronavirus infection, unspecified: Secondary | ICD-10-CM | POA: Diagnosis not present

## 2018-08-15 ENCOUNTER — Telehealth (INDEPENDENT_AMBULATORY_CARE_PROVIDER_SITE_OTHER): Payer: Medicare Other | Admitting: Family Medicine

## 2018-08-15 ENCOUNTER — Encounter: Payer: Self-pay | Admitting: Family Medicine

## 2018-08-15 VITALS — BP 149/51 | HR 62 | Temp 97.7°F | Wt 156.0 lb

## 2018-08-15 DIAGNOSIS — J449 Chronic obstructive pulmonary disease, unspecified: Secondary | ICD-10-CM

## 2018-08-15 DIAGNOSIS — N183 Chronic kidney disease, stage 3 unspecified: Secondary | ICD-10-CM

## 2018-08-15 DIAGNOSIS — I10 Essential (primary) hypertension: Secondary | ICD-10-CM

## 2018-08-15 DIAGNOSIS — G301 Alzheimer's disease with late onset: Secondary | ICD-10-CM

## 2018-08-15 DIAGNOSIS — E079 Disorder of thyroid, unspecified: Secondary | ICD-10-CM | POA: Diagnosis not present

## 2018-08-15 DIAGNOSIS — F329 Major depressive disorder, single episode, unspecified: Secondary | ICD-10-CM | POA: Diagnosis not present

## 2018-08-15 DIAGNOSIS — F0280 Dementia in other diseases classified elsewhere without behavioral disturbance: Secondary | ICD-10-CM | POA: Diagnosis not present

## 2018-08-15 DIAGNOSIS — F32A Depression, unspecified: Secondary | ICD-10-CM

## 2018-08-15 DIAGNOSIS — E785 Hyperlipidemia, unspecified: Secondary | ICD-10-CM | POA: Diagnosis not present

## 2018-08-15 DIAGNOSIS — Z66 Do not resuscitate: Secondary | ICD-10-CM | POA: Diagnosis not present

## 2018-08-15 NOTE — Assessment & Plan Note (Signed)
Clinically euthyroid 

## 2018-08-15 NOTE — Assessment & Plan Note (Signed)
Due for labwork

## 2018-08-15 NOTE — Assessment & Plan Note (Signed)
Doing very well at memory care- more social, no symptoms of depression. Continue namenda at current dose.

## 2018-08-15 NOTE — Progress Notes (Signed)
Virtual Visit via Video   Due to the COVID-19 pandemic, this visit was completed with telemedicine (audio/video) technology to reduce patient and provider exposure as well as to preserve personal protective equipment.   I connected with Joy [name] by a video enabled telemedicine application and verified that I am speaking with the correct person using two identifiers. Location patient: Home Location provider: [facility], Office Persons participating in the virtual visit: Joy [name], Arnette Norris, MD and Collier Salina, RN  I discussed the limitations of evaluation and management by telemedicine and the availability of in person appointments. The patient expressed understanding and agreed to proceed.  Care Team   Patient Care Team: Lucille Passy, MD as PCP - General (Family Medicine) Wellington Hampshire, MD (Cardiology) Mhoon, Larkin Ina, MD (Neurology) Birder Robson, MD (Ophthalmology)  Subjective:   HPI:   This is a mandatory every 14 day OV with PCP.  Spoke with patient's nurse, Collier Salina who states she is doing well on 2L 02.  She is a resident at [facility] care at [facility].  According to Collier Salina, he just spoke with her daughter, [name], who expressed no concers.  She is actually coming out of her room more and becoming more social which is fantastic!  HTN- Her BP has been stable along with her weight on low dose norvasc and lisinopril.  Lab Results  Component Value Date   CREATININE 1.34 (H) 01/30/2018     He does request a d/c order for norco as her arm has healed. (she sustained a right shoulder injury in 01/2018).  HLD- takes lipitor 20 mg daily. Lab Results  Component Value Date   CHOL 123 10/20/2017   HDL 39 10/20/2017   LDLCALC 59 10/20/2017   LDLDIRECT 99.0 01/02/2015   TRIG 167 (A) 10/20/2017   CHOLHDL 4 07/21/2016   Lab Results  Component Value Date   ALT 17 01/30/2018   AST 24 01/30/2018   ALKPHOS 113 01/30/2018   BILITOT 0.8 01/30/2018     Hypothyroidism- clinically euthyroid on current dose of synthroid.  Lab Results  Component Value Date   TSH 3.27 10/20/2017   Alzheimer's dementia without behavioral disturbances- adjusted well to memory care.   She is taking namenda 10 mg twice daily.  Depression- has been doing well on lower dose of zoloft, 50 mg daily.  Depression screen [facility] 2/9 08/15/2018 10/26/2017 05/10/2017 04/07/2017 07/21/2016  Decreased Interest 0 0 2 0 0  Down, Depressed, Hopeless 0 0 0 0 0  PHQ - 2 Score 0 0 2 0 0  Altered sleeping - - 0 - -  Tired, decreased energy - - 3 - -  Change in appetite - - 2 - -  Feeling bad or failure about yourself  - - 0 - -  Trouble concentrating - - 3 - -  Moving slowly or fidgety/restless - - 0 - -  Suicidal thoughts - - 0 - -  PHQ-9 Score - - 10 - -  Difficult doing work/chores - - Somewhat difficult - -  Some recent data might be hidden      Review of Systems  Constitutional: Negative.   HENT: Negative.   Respiratory: Negative.   Cardiovascular: Negative.   Gastrointestinal: Negative.   Genitourinary: Negative.   Musculoskeletal: Negative.   Skin: Negative.   Neurological: Negative.   Endo/Heme/Allergies: Negative.   Psychiatric/Behavioral: Negative.   All other systems reviewed and are negative.    Patient Active Problem List   Diagnosis Date Noted  .  Alzheimer's dementia without behavioral disturbance ([place])   . Palliative care by specialist   . DNR (do not resuscitate)   . Goals of care, counseling/discussion   . Gabapentin-induced toxicity 04/04/2016  . Allergic rhinitis 11/04/2014  . Chronic diastolic heart failure ([place]) 07/22/2014  . Flash pulmonary edema ([place]) 06/30/2014  . CKD (chronic kidney disease) stage 3, GFR 30-59 ml/min (HCC) 06/11/2014  . Carotid artery disease ([place]) 04/08/2014  . CAD (coronary artery disease)   . HLD (hyperlipidemia)   . Lung nodule 01/15/2013  . Weakness generalized 12/26/2012  . Incontinence 12/26/2012  . GERD  (gastroesophageal reflux disease) 09/06/2012  . Neuropathy 01/14/2011  . Insomnia 01/14/2011  . MELAS (mitochondrial encephalopathy, lactic acidosis and stroke-like episodes) ([place]) 11/02/2010  . HTN (hypertension)   . COPD (chronic obstructive pulmonary disease) ([place])   . Depression   . Thyroid disease   . Cryoglobulinemia ([place]) 07/18/2010  . Essential tremor 07/18/2010  . Open-angle glaucoma 07/18/2010  . Other specified forms of chronic ischemic heart disease 07/18/2010  . Renal cyst 07/18/2010  . Senile osteoporosis 07/18/2010  . Urge incontinence 07/18/2010    Social History   Tobacco Use  . Smoking status: Former Smoker    Packs/day: 1.00    Years: 25.00    Pack years: 25.00    Types: Cigarettes    Quit date: 01/05/1996    Years since quitting: 22.6  . Smokeless tobacco: Never Used  Substance Use Topics  . Alcohol use: No    Current Outpatient Medications:  .  acetaminophen (TYLENOL) 650 MG CR tablet, Take 650 mg by mouth every 8 (eight) hours as needed for pain., Disp: , Rfl:  .  ADVAIR DISKUS 250-50 MCG/DOSE AEPB, INHALE 1 PUFF BY MOUTH TWICE A DAY, Disp: 60 each, Rfl: 0 .  albuterol (PROVENTIL HFA;VENTOLIN HFA) 108 (90 Base) MCG/ACT inhaler, Inhale 2 puffs into the lungs every 6 (six) hours as needed for wheezing or shortness of breath., Disp: 1 Inhaler, Rfl: 0 .  amLODipine (NORVASC) 5 MG tablet, Take 1 tablet (5 mg total) by mouth daily., Disp: 90 tablet, Rfl: 3 .  atorvastatin (LIPITOR) 20 MG tablet, Take 20 mg by mouth daily., Disp: , Rfl:  .  buPROPion (WELLBUTRIN XL) 150 MG 24 hr tablet, Take 150 mg by mouth daily., Disp: , Rfl:  .  clopidogrel (PLAVIX) 75 MG tablet, Take 1 tablet (75 mg total) by mouth daily., Disp: 90 tablet, Rfl: 1 .  docusate sodium (COLACE) 100 MG capsule, Take 1 tablet once or twice daily as needed for constipation while taking narcotic pain medicine, Disp: 30 capsule, Rfl: 0 .  ipratropium-albuterol (DUONEB) 0.5-2.5 (3) MG/3ML SOLN, Take 3  mLs by nebulization every 4 (four) hours as needed (Wheezing/Dyspnea)., Disp: , Rfl:  .  levothyroxine (SYNTHROID, LEVOTHROID) 25 MCG tablet, Take 25 mcg by mouth daily before breakfast., Disp: , Rfl:  .  lisinopril (PRINIVIL,ZESTRIL) 10 MG tablet, Take 1 tablet (10 mg total) by mouth daily., Disp: 30 tablet, Rfl: 5 .  memantine (NAMENDA) 10 MG tablet, Take 10 mg by mouth 2 (two) times daily., Disp: , Rfl:  .  nitroGLYCERIN (NITROSTAT) 0.4 MG SL tablet, Place 0.4 mg under the tongue every 5 (five) minutes as needed.  , Disp: , Rfl:  .  ondansetron (ZOFRAN ODT) 4 MG disintegrating tablet, Allow 1-2 tablets to dissolve in your mouth every 8 hours as needed for nausea/vomiting, Disp: 30 tablet, Rfl: 0 .  pantoprazole (PROTONIX) 40 MG tablet, Take 40 mg  by mouth daily., Disp: , Rfl:  .  polyethylene glycol (MIRALAX / GLYCOLAX) packet, Take 17 g by mouth daily., Disp: , Rfl:  .  sertraline (ZOLOFT) 100 MG tablet, TAKE 1 TABLET BY MOUTH EVERY DAY (Patient taking differently: TAKE 1 1/2 TABLET BY MOUTH EVERY DAY), Disp: 30 tablet, Rfl: 1 .  SPIRIVA HANDIHALER 18 MCG inhalation capsule, PLACE 1 CAPSULE (18 MCG TOTAL) INTO INHALER AND INHALE DAILY., Disp: 30 capsule, Rfl: 10 .  traMADol (ULTRAM) 50 MG tablet, Take 1 tablet (50 mg total) by mouth every 12 (twelve) hours as needed., Disp: 30 tablet, Rfl: 0 .  traZODone (DESYREL) 50 MG tablet, Take 25-50 mg by mouth at bedtime as needed for sleep. , Disp: , Rfl:  .  vitamin B-12 (CYANOCOBALAMIN) 1000 MCG tablet, Take 1,000 mcg by mouth daily., Disp: , Rfl:   Allergies  Allergen Reactions  . Prednisone Other (See Comments)    Weight gain,puffy face  . Shellfish Allergy Other (See Comments)    Unknown    Objective:  BP (!) 149/51   Pulse 62   Temp 97.7 F (36.5 C) (Oral)   Wt 156 lb (70.8 kg)   SpO2 96% Comment: 2L of O2  BMI 26.78 kg/m  Wt Readings from Last 3 Encounters:  08/15/18 156 lb (70.8 kg)  01/30/18 160 lb 7.9 oz (72.8 kg)  10/26/17 160  lb 6.4 oz (72.8 kg)    VITALS: Per patient if applicable, see vitals. GENERAL: Alert, appears well and in no acute distress. HEENT: Atraumatic, conjunctiva clear, no obvious abnormalities on inspection of external nose and ears. O2 per [place] NECK: Normal movements of the head and neck. CARDIOPULMONARY: No increased WOB. Speaking in clear sentences. I:E ratio WNL.  MS: Moves all visible extremities without noticeable abnormality. PSYCH: Pleasant and cooperative, well-groomed. Speech normal rate and rhythm. Affect is appropriate. Insight and judgement are appropriate. Attention is focused, linear, and appropriate.  NEURO: CN grossly intact. Oriented as arrived to appointment on time with no prompting. Moves both UE equally.  SKIN: No obvious lesions, wounds, erythema, or cyanosis noted on face or hands.  Depression screen [facility] 2/9 08/15/2018 10/26/2017 05/10/2017  Decreased Interest 0 0 2  Down, Depressed, Hopeless 0 0 0  PHQ - 2 Score 0 0 2  Altered sleeping - - 0  Tired, decreased energy - - 3  Change in appetite - - 2  Feeling bad or failure about yourself  - - 0  Trouble concentrating - - 3  Moving slowly or fidgety/restless - - 0  Suicidal thoughts - - 0  PHQ-9 Score - - 10  Difficult doing work/chores - - Somewhat difficult  Some recent data might be hidden    Assessment and Plan:   There are no diagnoses linked to this encounter.  Marland Kitchen COVID-19 Education: The signs and symptoms of COVID-19 were discussed with the patient and how to seek care for testing if needed. The importance of social distancing was discussed today. . Reviewed expectations re: course of current medical issues. . Discussed self-management of symptoms. . Outlined signs and symptoms indicating need for more acute intervention. . Patient verbalized understanding and all questions were answered. Marland Kitchen Health Maintenance issues including appropriate healthy diet, exercise, and smoking avoidance were discussed with  patient. . See orders for this visit as documented in the electronic medical record.  Arnette Norris, MD  Records requested if needed. Time spent: 25 minutes, of which >50% was spent in obtaining information about her  symptoms, reviewing her previous labs, evaluations, and treatments, counseling her about her condition (please see the discussed topics above), and developing a plan to further investigate it; she had a number of questions which I addressed.

## 2018-08-15 NOTE — Assessment & Plan Note (Signed)
PHQ was zero!!!! Continue lower dose of zoloft.  No changes made today.  Depression screen [facility] 2/9 08/15/2018 10/26/2017 05/10/2017 04/07/2017 07/21/2016  Decreased Interest 0 0 2 0 0  Down, Depressed, Hopeless 0 0 0 0 0  PHQ - 2 Score 0 0 2 0 0  Altered sleeping - - 0 - -  Tired, decreased energy - - 3 - -  Change in appetite - - 2 - -  Feeling bad or failure about yourself  - - 0 - -  Trouble concentrating - - 3 - -  Moving slowly or fidgety/restless - - 0 - -  Suicidal thoughts - - 0 - -  PHQ-9 Score - - 10 - -  Difficult doing work/chores - - Somewhat difficult - -  Some recent data might be hidden

## 2018-08-15 NOTE — Assessment & Plan Note (Signed)
Continue statin. Labs ordered and faxed to [facility]. Orders Placed This Encounter  Procedures  . CBC w/Diff  . TSH  . Comp Met (CMET)  . VITAMIN D 25 Hydroxy (Vit-D Deficiency, Fractures)

## 2018-08-15 NOTE — Assessment & Plan Note (Signed)
Well controlled on current rxs.  No changes made toady.

## 2018-08-15 NOTE — Patient Instructions (Addendum)
1.) Joy [name] will need her Pneumonia-23 & Flu vaccines soon.   2.) Please Discontinue Norco.  3.) I have ordered the following labs: CMP; Vit-Leaha Cuervo; TSH; CBC w/Diff

## 2018-08-15 NOTE — Assessment & Plan Note (Signed)
Doing well on 2 L of O2 per [place].

## 2018-08-17 DIAGNOSIS — I2510 Atherosclerotic heart disease of native coronary artery without angina pectoris: Secondary | ICD-10-CM | POA: Diagnosis not present

## 2018-08-17 DIAGNOSIS — J449 Chronic obstructive pulmonary disease, unspecified: Secondary | ICD-10-CM | POA: Diagnosis not present

## 2018-08-17 DIAGNOSIS — E039 Hypothyroidism, unspecified: Secondary | ICD-10-CM | POA: Diagnosis not present

## 2018-08-17 DIAGNOSIS — E559 Vitamin D deficiency, unspecified: Secondary | ICD-10-CM | POA: Diagnosis not present

## 2018-08-17 DIAGNOSIS — I10 Essential (primary) hypertension: Secondary | ICD-10-CM | POA: Diagnosis not present

## 2018-08-17 LAB — HEPATIC FUNCTION PANEL
ALT: 9 (ref 7–35)
AST: 15 (ref 13–35)
Alkaline Phosphatase: 98 (ref 25–125)
Bilirubin, Total: 0.6

## 2018-08-17 LAB — BASIC METABOLIC PANEL
BUN: 36 — AB (ref 4–21)
Creatinine: 1.3 — AB (ref <=1.1)
Potassium: 4.4 (ref 3.4–5.3)
Sodium: 142 (ref 137–147)

## 2018-08-17 LAB — CBC AND DIFFERENTIAL
HCT: 29 — AB (ref 36–46)
Hemoglobin: 9.5 — AB (ref 12.0–16.0)
Platelets: 123 — AB (ref 150–399)

## 2018-08-17 LAB — VITAMIN D 25 HYDROXY (VIT D DEFICIENCY, FRACTURES): Vit D, 25-Hydroxy: 19

## 2018-10-11 ENCOUNTER — Encounter: Payer: Self-pay | Admitting: Family Medicine

## 2018-12-26 DIAGNOSIS — J9611 Chronic respiratory failure with hypoxia: Secondary | ICD-10-CM | POA: Diagnosis not present

## 2018-12-26 DIAGNOSIS — N183 Chronic kidney disease, stage 3 unspecified: Secondary | ICD-10-CM

## 2018-12-26 DIAGNOSIS — J439 Emphysema, unspecified: Secondary | ICD-10-CM | POA: Diagnosis not present

## 2018-12-26 DIAGNOSIS — F39 Unspecified mood [affective] disorder: Secondary | ICD-10-CM

## 2018-12-26 DIAGNOSIS — I6521 Occlusion and stenosis of right carotid artery: Secondary | ICD-10-CM

## 2018-12-26 DIAGNOSIS — I2510 Atherosclerotic heart disease of native coronary artery without angina pectoris: Secondary | ICD-10-CM | POA: Diagnosis not present

## 2018-12-26 DIAGNOSIS — G301 Alzheimer's disease with late onset: Secondary | ICD-10-CM | POA: Diagnosis not present

## 2018-12-26 DIAGNOSIS — I10 Essential (primary) hypertension: Secondary | ICD-10-CM

## 2019-01-14 DIAGNOSIS — Z23 Encounter for immunization: Secondary | ICD-10-CM | POA: Diagnosis not present

## 2019-01-16 DIAGNOSIS — F39 Unspecified mood [affective] disorder: Secondary | ICD-10-CM | POA: Diagnosis not present

## 2019-01-16 DIAGNOSIS — G301 Alzheimer's disease with late onset: Secondary | ICD-10-CM | POA: Diagnosis not present

## 2019-01-16 DIAGNOSIS — J9611 Chronic respiratory failure with hypoxia: Secondary | ICD-10-CM | POA: Diagnosis not present

## 2019-01-16 DIAGNOSIS — I6529 Occlusion and stenosis of unspecified carotid artery: Secondary | ICD-10-CM | POA: Diagnosis not present

## 2019-01-16 DIAGNOSIS — J439 Emphysema, unspecified: Secondary | ICD-10-CM | POA: Diagnosis not present

## 2019-03-16 DIAGNOSIS — I6529 Occlusion and stenosis of unspecified carotid artery: Secondary | ICD-10-CM

## 2019-03-16 DIAGNOSIS — K219 Gastro-esophageal reflux disease without esophagitis: Secondary | ICD-10-CM

## 2019-03-16 DIAGNOSIS — N183 Chronic kidney disease, stage 3 unspecified: Secondary | ICD-10-CM

## 2019-03-16 DIAGNOSIS — I2510 Atherosclerotic heart disease of native coronary artery without angina pectoris: Secondary | ICD-10-CM | POA: Diagnosis not present

## 2019-03-16 DIAGNOSIS — I10 Essential (primary) hypertension: Secondary | ICD-10-CM

## 2019-03-16 DIAGNOSIS — J069 Acute upper respiratory infection, unspecified: Secondary | ICD-10-CM | POA: Diagnosis not present

## 2019-03-16 DIAGNOSIS — F39 Unspecified mood [affective] disorder: Secondary | ICD-10-CM

## 2019-03-16 DIAGNOSIS — J449 Chronic obstructive pulmonary disease, unspecified: Secondary | ICD-10-CM | POA: Diagnosis not present

## 2019-03-16 DIAGNOSIS — K5900 Constipation, unspecified: Secondary | ICD-10-CM

## 2019-03-16 DIAGNOSIS — G309 Alzheimer's disease, unspecified: Secondary | ICD-10-CM | POA: Diagnosis not present

## 2019-04-18 DIAGNOSIS — D692 Other nonthrombocytopenic purpura: Secondary | ICD-10-CM

## 2019-05-15 DIAGNOSIS — I6529 Occlusion and stenosis of unspecified carotid artery: Secondary | ICD-10-CM | POA: Diagnosis not present

## 2019-05-15 DIAGNOSIS — J9611 Chronic respiratory failure with hypoxia: Secondary | ICD-10-CM | POA: Diagnosis not present

## 2019-05-15 DIAGNOSIS — J439 Emphysema, unspecified: Secondary | ICD-10-CM | POA: Diagnosis not present

## 2019-05-15 DIAGNOSIS — N183 Chronic kidney disease, stage 3 unspecified: Secondary | ICD-10-CM

## 2019-05-15 DIAGNOSIS — G301 Alzheimer's disease with late onset: Secondary | ICD-10-CM | POA: Diagnosis not present

## 2019-06-18 ENCOUNTER — Inpatient Hospital Stay: Payer: Medicare Other

## 2019-06-18 ENCOUNTER — Emergency Department: Payer: Medicare Other

## 2019-06-18 ENCOUNTER — Other Ambulatory Visit: Payer: Self-pay

## 2019-06-18 ENCOUNTER — Encounter: Payer: Self-pay | Admitting: Emergency Medicine

## 2019-06-18 ENCOUNTER — Inpatient Hospital Stay
Admission: EM | Admit: 2019-06-18 | Discharge: 2019-06-20 | DRG: 305 | Disposition: A | Discharge status: Skilled Nursing Facility | Payer: Medicare Other | Source: Skilled Nursing Facility | Attending: Internal Medicine | Admitting: Internal Medicine

## 2019-06-18 DIAGNOSIS — I5032 Chronic diastolic (congestive) heart failure: Secondary | ICD-10-CM | POA: Diagnosis present

## 2019-06-18 DIAGNOSIS — M542 Cervicalgia: Secondary | ICD-10-CM

## 2019-06-18 DIAGNOSIS — F419 Anxiety disorder, unspecified: Secondary | ICD-10-CM | POA: Diagnosis present

## 2019-06-18 DIAGNOSIS — Z8673 Personal history of transient ischemic attack (TIA), and cerebral infarction without residual deficits: Secondary | ICD-10-CM | POA: Diagnosis not present

## 2019-06-18 DIAGNOSIS — I130 Hypertensive heart and chronic kidney disease with heart failure and stage 1 through stage 4 chronic kidney disease, or unspecified chronic kidney disease: Secondary | ICD-10-CM | POA: Diagnosis present

## 2019-06-18 DIAGNOSIS — E785 Hyperlipidemia, unspecified: Secondary | ICD-10-CM | POA: Diagnosis present

## 2019-06-18 DIAGNOSIS — R079 Chest pain, unspecified: Secondary | ICD-10-CM | POA: Diagnosis not present

## 2019-06-18 DIAGNOSIS — I10 Essential (primary) hypertension: Secondary | ICD-10-CM | POA: Diagnosis not present

## 2019-06-18 DIAGNOSIS — Z9981 Dependence on supplemental oxygen: Secondary | ICD-10-CM | POA: Diagnosis not present

## 2019-06-18 DIAGNOSIS — Z87891 Personal history of nicotine dependence: Secondary | ICD-10-CM

## 2019-06-18 DIAGNOSIS — F0150 Vascular dementia without behavioral disturbance: Secondary | ICD-10-CM | POA: Diagnosis present

## 2019-06-18 DIAGNOSIS — N1832 Chronic kidney disease, stage 3b: Secondary | ICD-10-CM | POA: Diagnosis present

## 2019-06-18 DIAGNOSIS — Z79890 Hormone replacement therapy (postmenopausal): Secondary | ICD-10-CM

## 2019-06-18 DIAGNOSIS — G309 Alzheimer's disease, unspecified: Secondary | ICD-10-CM | POA: Diagnosis present

## 2019-06-18 DIAGNOSIS — J449 Chronic obstructive pulmonary disease, unspecified: Secondary | ICD-10-CM | POA: Diagnosis not present

## 2019-06-18 DIAGNOSIS — Z9861 Coronary angioplasty status: Secondary | ICD-10-CM

## 2019-06-18 DIAGNOSIS — Z820 Family history of epilepsy and other diseases of the nervous system: Secondary | ICD-10-CM

## 2019-06-18 DIAGNOSIS — I248 Other forms of acute ischemic heart disease: Secondary | ICD-10-CM | POA: Diagnosis present

## 2019-06-18 DIAGNOSIS — M353 Polymyalgia rheumatica: Secondary | ICD-10-CM | POA: Diagnosis present

## 2019-06-18 DIAGNOSIS — Z79899 Other long term (current) drug therapy: Secondary | ICD-10-CM

## 2019-06-18 DIAGNOSIS — F329 Major depressive disorder, single episode, unspecified: Secondary | ICD-10-CM | POA: Diagnosis present

## 2019-06-18 DIAGNOSIS — I160 Hypertensive urgency: Secondary | ICD-10-CM | POA: Diagnosis not present

## 2019-06-18 DIAGNOSIS — Z83511 Family history of glaucoma: Secondary | ICD-10-CM

## 2019-06-18 DIAGNOSIS — E039 Hypothyroidism, unspecified: Secondary | ICD-10-CM | POA: Diagnosis not present

## 2019-06-18 DIAGNOSIS — F0280 Dementia in other diseases classified elsewhere without behavioral disturbance: Secondary | ICD-10-CM | POA: Diagnosis present

## 2019-06-18 DIAGNOSIS — I340 Nonrheumatic mitral (valve) insufficiency: Secondary | ICD-10-CM | POA: Diagnosis not present

## 2019-06-18 DIAGNOSIS — I252 Old myocardial infarction: Secondary | ICD-10-CM | POA: Diagnosis not present

## 2019-06-18 DIAGNOSIS — R0902 Hypoxemia: Secondary | ICD-10-CM | POA: Diagnosis not present

## 2019-06-18 DIAGNOSIS — Z7951 Long term (current) use of inhaled steroids: Secondary | ICD-10-CM

## 2019-06-18 DIAGNOSIS — Z66 Do not resuscitate: Secondary | ICD-10-CM | POA: Diagnosis present

## 2019-06-18 DIAGNOSIS — R531 Weakness: Secondary | ICD-10-CM | POA: Diagnosis not present

## 2019-06-18 DIAGNOSIS — R0602 Shortness of breath: Secondary | ICD-10-CM | POA: Diagnosis not present

## 2019-06-18 DIAGNOSIS — Z20822 Contact with and (suspected) exposure to covid-19: Secondary | ICD-10-CM | POA: Diagnosis present

## 2019-06-18 DIAGNOSIS — I2510 Atherosclerotic heart disease of native coronary artery without angina pectoris: Secondary | ICD-10-CM | POA: Diagnosis present

## 2019-06-18 DIAGNOSIS — M810 Age-related osteoporosis without current pathological fracture: Secondary | ICD-10-CM | POA: Diagnosis present

## 2019-06-18 DIAGNOSIS — I350 Nonrheumatic aortic (valve) stenosis: Secondary | ICD-10-CM | POA: Diagnosis not present

## 2019-06-18 DIAGNOSIS — H4010X0 Unspecified open-angle glaucoma, stage unspecified: Secondary | ICD-10-CM | POA: Diagnosis present

## 2019-06-18 DIAGNOSIS — I161 Hypertensive emergency: Secondary | ICD-10-CM | POA: Diagnosis present

## 2019-06-18 DIAGNOSIS — I6521 Occlusion and stenosis of right carotid artery: Secondary | ICD-10-CM | POA: Diagnosis not present

## 2019-06-18 DIAGNOSIS — Z888 Allergy status to other drugs, medicaments and biological substances status: Secondary | ICD-10-CM

## 2019-06-18 DIAGNOSIS — I700 Atherosclerosis of aorta: Secondary | ICD-10-CM | POA: Diagnosis present

## 2019-06-18 DIAGNOSIS — Z7902 Long term (current) use of antithrombotics/antiplatelets: Secondary | ICD-10-CM

## 2019-06-18 DIAGNOSIS — I471 Supraventricular tachycardia: Secondary | ICD-10-CM | POA: Diagnosis not present

## 2019-06-18 DIAGNOSIS — Z91013 Allergy to seafood: Secondary | ICD-10-CM

## 2019-06-18 DIAGNOSIS — R0789 Other chest pain: Secondary | ICD-10-CM | POA: Diagnosis not present

## 2019-06-18 HISTORY — DX: Fracture of right shoulder girdle, part unspecified, initial encounter for closed fracture: S42.91XA

## 2019-06-18 LAB — CBC
HCT: 31.4 % — ABNORMAL LOW (ref 36.0–46.0)
Hemoglobin: 10.7 g/dL — ABNORMAL LOW (ref 12.0–15.0)
MCH: 31.9 pg (ref 26.0–34.0)
MCHC: 34.1 g/dL (ref 30.0–36.0)
MCV: 93.7 fL (ref 80.0–100.0)
Platelets: 150 10*3/uL (ref 150–400)
RBC: 3.35 MIL/uL — ABNORMAL LOW (ref 3.87–5.11)
RDW: 15.3 % (ref 11.5–15.5)
WBC: 5.2 10*3/uL (ref 4.0–10.5)
nRBC: 0 % (ref 0.0–0.2)

## 2019-06-18 LAB — URINALYSIS, COMPLETE (UACMP) WITH MICROSCOPIC
Bilirubin Urine: NEGATIVE
Glucose, UA: NEGATIVE mg/dL
Hgb urine dipstick: NEGATIVE
Ketones, ur: NEGATIVE mg/dL
Leukocytes,Ua: NEGATIVE
Nitrite: NEGATIVE
Protein, ur: NEGATIVE mg/dL
Specific Gravity, Urine: 1.011 (ref 1.005–1.030)
pH: 7 (ref 5.0–8.0)

## 2019-06-18 LAB — BASIC METABOLIC PANEL
Anion gap: 8 (ref 5–15)
BUN: 24 mg/dL — ABNORMAL HIGH (ref 8–23)
CO2: 28 mmol/L (ref 22–32)
Calcium: 8.9 mg/dL (ref 8.9–10.3)
Chloride: 106 mmol/L (ref 98–111)
Creatinine, Ser: 1.2 mg/dL — ABNORMAL HIGH (ref 0.44–1.00)
GFR calc Af Amer: 47 mL/min — ABNORMAL LOW (ref >60)
GFR calc non Af Amer: 41 mL/min — ABNORMAL LOW (ref >60)
Glucose, Bld: 98 mg/dL (ref 70–99)
Potassium: 4.6 mmol/L (ref 3.5–5.1)
Sodium: 142 mmol/L (ref 135–145)

## 2019-06-18 LAB — TROPONIN I (HIGH SENSITIVITY)
Troponin I (High Sensitivity): 9 ng/L (ref <18)
Troponin I (High Sensitivity): 11 ng/L
Troponin I (High Sensitivity): 12 ng/L (ref <18)

## 2019-06-18 LAB — SARS CORONAVIRUS 2 BY RT PCR (HOSPITAL ORDER, PERFORMED IN ~~LOC~~ HOSPITAL LAB): SARS Coronavirus 2: NEGATIVE

## 2019-06-18 MED ORDER — LABETALOL HCL 5 MG/ML IV SOLN: 10.0000 mg | INTRAVENOUS | Status: DC | PRN

## 2019-06-18 MED ORDER — SERTRALINE HCL 50 MG PO TABS
50.0000 mg | ORAL_TABLET | Freq: Every day | ORAL | Status: DC
  Administered 2019-06-18 – 2019-06-19 (×2): 50 mg via ORAL
  Filled 2019-06-18 (×2): qty 1

## 2019-06-18 MED ORDER — HYDRALAZINE HCL 20 MG/ML IJ SOLN
5.0000 mg | Freq: Once | INTRAMUSCULAR | Status: AC
  Administered 2019-06-18: 5 mg via INTRAVENOUS
  Filled 2019-06-18: qty 1

## 2019-06-18 MED ORDER — CLOPIDOGREL BISULFATE 75 MG PO TABS
75.0000 mg | ORAL_TABLET | Freq: Every day | ORAL | Status: DC
  Administered 2019-06-19 – 2019-06-20 (×2): 75 mg via ORAL
  Filled 2019-06-18 (×2): qty 1

## 2019-06-18 MED ORDER — ZINC OXIDE 40 % EX OINT
1.0000 "application " | TOPICAL_OINTMENT | CUTANEOUS | Status: DC | PRN
  Filled 2019-06-18: qty 113

## 2019-06-18 MED ORDER — LISINOPRIL 10 MG PO TABS
10.0000 mg | ORAL_TABLET | Freq: Once | ORAL | Status: AC
  Administered 2019-06-18: 10 mg via ORAL
  Filled 2019-06-18: qty 1

## 2019-06-18 MED ORDER — AMLODIPINE BESYLATE 5 MG PO TABS
5.0000 mg | ORAL_TABLET | Freq: Once | ORAL | Status: AC
  Administered 2019-06-18: 5 mg via ORAL
  Filled 2019-06-18: qty 1

## 2019-06-18 MED ORDER — TIOTROPIUM BROMIDE MONOHYDRATE 18 MCG IN CAPS
18.0000 ug | ORAL_CAPSULE | Freq: Every day | RESPIRATORY_TRACT | Status: DC
  Filled 2019-06-18: qty 5

## 2019-06-18 MED ORDER — MOMETASONE FURO-FORMOTEROL FUM 200-5 MCG/ACT IN AERO
2.0000 | INHALATION_SPRAY | Freq: Two times a day (BID) | RESPIRATORY_TRACT | Status: DC
  Administered 2019-06-19 – 2019-06-20 (×2): 2 via RESPIRATORY_TRACT
  Filled 2019-06-18 (×2): qty 8.8

## 2019-06-18 MED ORDER — ACETAMINOPHEN 325 MG PO TABS: 650.0000 mg | ORAL_TABLET | Freq: Four times a day (QID) | ORAL | Status: DC | PRN

## 2019-06-18 MED ORDER — SODIUM CHLORIDE 0.9 % IV BOLUS
500.0000 mL | Freq: Once | INTRAVENOUS | Status: AC
  Administered 2019-06-18: 500 mL via INTRAVENOUS

## 2019-06-18 MED ORDER — NITROGLYCERIN 0.4 MG SL SUBL
0.4000 mg | SUBLINGUAL_TABLET | Freq: Once | SUBLINGUAL | Status: AC
  Administered 2019-06-18: 0.4 mg via SUBLINGUAL
  Filled 2019-06-18: qty 1

## 2019-06-18 MED ORDER — NITROGLYCERIN 0.4 MG SL SUBL: 0.4000 mg | SUBLINGUAL_TABLET | SUBLINGUAL | Status: DC | PRN

## 2019-06-18 MED ORDER — ENOXAPARIN SODIUM 40 MG/0.4ML ~~LOC~~ SOLN
40.0000 mg | SUBCUTANEOUS | Status: DC
  Administered 2019-06-18 – 2019-06-19 (×2): 40 mg via SUBCUTANEOUS
  Filled 2019-06-18 (×2): qty 0.4

## 2019-06-18 MED ORDER — POLYETHYLENE GLYCOL 3350 17 G PO PACK
17.0000 g | PACK | Freq: Every day | ORAL | Status: DC
  Administered 2019-06-19: 17 g via ORAL
  Filled 2019-06-18 (×2): qty 1

## 2019-06-18 MED ORDER — AMLODIPINE BESYLATE 5 MG PO TABS
5.0000 mg | ORAL_TABLET | Freq: Every day | ORAL | Status: DC
  Administered 2019-06-18: 5 mg via ORAL
  Filled 2019-06-18: qty 1

## 2019-06-18 MED ORDER — SODIUM CHLORIDE 0.9 % IV SOLN: INTRAVENOUS | Status: DC

## 2019-06-18 MED ORDER — LEVOTHYROXINE SODIUM 25 MCG PO TABS
25.0000 ug | ORAL_TABLET | Freq: Every day | ORAL | Status: DC
  Administered 2019-06-18 – 2019-06-19 (×2): 25 ug via ORAL
  Filled 2019-06-18 (×2): qty 1

## 2019-06-18 MED ORDER — ACETAMINOPHEN 650 MG RE SUPP: 650.0000 mg | Freq: Four times a day (QID) | RECTAL | Status: DC | PRN

## 2019-06-18 MED ORDER — LISINOPRIL 10 MG PO TABS: 10.0000 mg | ORAL_TABLET | Freq: Every day | ORAL | Status: DC

## 2019-06-18 MED ORDER — LABETALOL HCL 100 MG PO TABS
100.0000 mg | ORAL_TABLET | Freq: Two times a day (BID) | ORAL | Status: DC
  Administered 2019-06-18: 100 mg via ORAL
  Filled 2019-06-18 (×4): qty 1

## 2019-06-18 MED ORDER — PANTOPRAZOLE SODIUM 20 MG PO TBEC
20.0000 mg | DELAYED_RELEASE_TABLET | Freq: Every day | ORAL | Status: DC
  Administered 2019-06-20: 20 mg via ORAL
  Filled 2019-06-18 (×2): qty 1

## 2019-06-18 MED ORDER — VITAMIN B-12 1000 MCG PO TABS
1000.0000 ug | ORAL_TABLET | Freq: Every day | ORAL | Status: DC
  Administered 2019-06-19 – 2019-06-20 (×2): 1000 ug via ORAL
  Filled 2019-06-18 (×3): qty 1

## 2019-06-18 MED ORDER — ONDANSETRON HCL 4 MG PO TABS: 4.0000 mg | ORAL_TABLET | Freq: Four times a day (QID) | ORAL | Status: DC | PRN

## 2019-06-18 MED ORDER — ONDANSETRON HCL 4 MG/2ML IJ SOLN: 4.0000 mg | Freq: Four times a day (QID) | INTRAMUSCULAR | Status: DC | PRN

## 2019-06-18 MED ORDER — ATORVASTATIN CALCIUM 20 MG PO TABS
20.0000 mg | ORAL_TABLET | Freq: Every day | ORAL | Status: DC
  Administered 2019-06-18 – 2019-06-19 (×2): 20 mg via ORAL
  Filled 2019-06-18 (×2): qty 1

## 2019-06-18 MED ORDER — ASPIRIN 81 MG PO CHEW
324.0000 mg | CHEWABLE_TABLET | Freq: Once | ORAL | Status: AC
  Administered 2019-06-18: 324 mg via ORAL
  Filled 2019-06-18: qty 4

## 2019-06-18 MED ORDER — MEMANTINE HCL 10 MG PO TABS
10.0000 mg | ORAL_TABLET | Freq: Two times a day (BID) | ORAL | Status: DC
  Administered 2019-06-18 – 2019-06-20 (×4): 10 mg via ORAL
  Filled 2019-06-18: qty 1
  Filled 2019-06-18: qty 2
  Filled 2019-06-18 (×3): qty 1

## 2019-06-18 MED ORDER — ALBUTEROL SULFATE (2.5 MG/3ML) 0.083% IN NEBU: 2.5000 mg | INHALATION_SOLUTION | Freq: Four times a day (QID) | RESPIRATORY_TRACT | Status: DC | PRN

## 2019-06-18 MED ORDER — IOHEXOL 350 MG/ML SOLN
60.0000 mL | Freq: Once | INTRAVENOUS | Status: AC | PRN
  Administered 2019-06-18: 60 mL via INTRAVENOUS

## 2019-06-18 NOTE — Progress Notes (Signed)
Report called to 2A Administrator, arts. Family at bedside.

## 2019-06-18 NOTE — H&P (Signed)
[facility] [place] NAME: Joy [name]    MR#:  [id number]  DATE OF BIRTH:  [birth year]  DATE OF ADMISSION:  06/18/2019  PRIMARY CARE PHYSICIAN: Lucille Passy, MD   REQUESTING/REFERRING PHYSICIAN: Dr Lavonia Drafts  CHIEF COMPLAINT:   Chief Complaint  Patient presents with  . Chest Pain    HISTORY OF PRESENT ILLNESS:  Joy [name]  is a 86 y.o. female presenting to the hospital with chest pain.  The chest pain started this morning constant left side of the chest described as a constant 8 out of 10 intensity.  Nothing making it better or worse.  She also had a little shortness of breath with shallow breathing.  This afternoon the pain radiated up into her neck and still has soreness in her neck.  Her first 2 troponins were negative prior to the initiation of the neck pain.  Repeat EKG after the neck pain did not show any acute changes.  Blood pressure in the ER has been very elevated despite giving oral medications.  PAST MEDICAL HISTORY:   Past Medical History:  Diagnosis Date  . Alzheimer disease ([place])   . Anxiety   . CAD (coronary artery disease)    h/o NSTEMI, LAD stent placement 10/04, cath 05/11/2010  . Carotid artery occlusion   . COPD (chronic obstructive pulmonary disease) ([place])   . Depression   . Diastolic dysfunction    Echo 05/11/2010, EF 65-70%  . HLD (hyperlipidemia)   . HTN (hypertension)   . Hx of MELAS    MELAS CARRIER  . Hypertension   . Kidney infection   . PMR (polymyalgia rheumatica) (HCC)    with h/o steroid induced myopathy  . Shoulder fracture, right   . Thyroid disease   . TIA (transient ischemic attack)    11/05 s/p L CEA    PAST SURGICAL HISTORY:   Past Surgical History:  Procedure Laterality Date  . CARDIAC CATHETERIZATION     [facility]  . CARDIAC CATHETERIZATION    . CARDIAC CATHETERIZATION    . CAROTID ENDARTERECTOMY     left with stent placement 11/2003  . CESAREAN SECTION    . ILIAC ARTERY  STENT     right, 2001  . LUNG BIOPSY      SOCIAL HISTORY:   Social History   Tobacco Use  . Smoking status: Former Smoker    Packs/day: 1.00    Years: 25.00    Pack years: 25.00    Types: Cigarettes    Quit date: 01/05/1996    Years since quitting: 23.4  . Smokeless tobacco: Never Used  Substance Use Topics  . Alcohol use: No    FAMILY HISTORY:   Family History  Problem Relation Age of Onset  . Arthritis Mother   . HIV Mother   . Arthritis Father   . Glaucoma Father   . CAD Father   . Alzheimer's disease Brother   . Heart disease Brother   . Arthritis Brother   . Heart disease Brother   . Arthritis Brother   . Glaucoma Brother     DRUG ALLERGIES:   Allergies  Allergen Reactions  . Prednisone Other (See Comments)    Weight gain,puffy face  . Shellfish Allergy Other (See Comments)    Unknown    REVIEW OF SYSTEMS:  CONSTITUTIONAL: No fever.  Positive for chills.  Weight goes up and down. EYES: No blurred or double vision.  EARS, NOSE, AND THROAT: No  tinnitus or ear pain. No sore throat.  Positive for postnasal drip RESPIRATORY: No cough.  Slight shortness of breath.  No wheezing or hemoptysis.  CARDIOVASCULAR: Positive for chest pain.  GASTROINTESTINAL: No nausea, vomiting, diarrhea or abdominal pain. No blood in bowel movements GENITOURINARY: No dysuria, hematuria.  ENDOCRINE: No polyuria, nocturia,  HEMATOLOGY: No anemia, easy bruising or bleeding SKIN: No rash or lesion. MUSCULOSKELETAL: Positive for joint pains in her shoulders arms and hands. NEUROLOGIC: No tingling, numbness, weakness.  PSYCHIATRY: No anxiety or depression.   MEDICATIONS AT HOME:   Prior to Admission medications   Medication Sig Start Date End Date Taking? Authorizing Provider  acetaminophen (TYLENOL) 650 MG CR tablet Take 650 mg by mouth every 8 (eight) hours as needed for pain.   Yes [provider]  ADVAIR DISKUS 250-50 MCG/DOSE AEPB INHALE 1 PUFF BY MOUTH TWICE A  DAY Patient taking differently: Inhale 1 puff into the lungs in the morning and at bedtime.  09/29/15  Yes Lucille Passy, MD  albuterol (PROVENTIL) (2.5 MG/3ML) 0.083% nebulizer solution Take 2.5 mg by nebulization every 6 (six) hours as needed for wheezing or shortness of breath.   Yes [provider]  atorvastatin (LIPITOR) 20 MG tablet Take 20 mg by mouth at bedtime.    Yes [provider]  barrier cream (NON-SPECIFIED) CREA Apply 1 application topically as needed (rash). (apply to perineal area)   Yes [provider]  bisacodyl (DULCOLAX) 10 MG suppository Place 10 mg rectally daily as needed for moderate constipation.   Yes [provider]  bismuth subsalicylate (PEPTO BISMOL) 262 MG/15ML suspension Take 30 mLs by mouth daily as needed for diarrhea or loose stools.   Yes [provider]  clopidogrel (PLAVIX) 75 MG tablet Take 1 tablet (75 mg total) by mouth daily. 05/22/15  Yes Lucille Passy, MD  Dextromethorphan-guaiFENesin (TUSSIN DM MAX) 5-100 MG/5ML LIQD Take 10 mLs by mouth every 4 (four) hours as needed (cough).   Yes [provider]  ipratropium-albuterol (DUONEB) 0.5-2.5 (3) MG/3ML SOLN Take 3 mLs by nebulization every 4 (four) hours as needed (wheezing or dyspnea).    Yes [provider]  levothyroxine (SYNTHROID, LEVOTHROID) 25 MCG tablet Take 25 mcg by mouth at bedtime.    Yes [provider]  lisinopril (PRINIVIL,ZESTRIL) 10 MG tablet Take 1 tablet (10 mg total) by mouth daily. 07/15/15  Yes Wellington Hampshire, MD  memantine (NAMENDA) 10 MG tablet Take 10 mg by mouth 2 (two) times daily.   Yes [provider]  nitroGLYCERIN (NITROSTAT) 0.4 MG SL tablet Place 0.4 mg under the tongue every 5 (five) minutes as needed for chest pain.    Yes [provider]  ondansetron (ZOFRAN ODT) 4 MG disintegrating tablet Allow 1-2 tablets to dissolve in your mouth every 8 hours as needed for nausea/vomiting 01/31/18   Yes Hinda Kehr, MD  pantoprazole (PROTONIX) 20 MG tablet Take 20 mg by mouth daily.    Yes [provider]  polyethylene glycol (MIRALAX / GLYCOLAX) packet Take 17 g by mouth daily.   Yes [provider]  sertraline (ZOLOFT) 50 MG tablet Take 50 mg by mouth at bedtime.   Yes [provider]  SPIRIVA HANDIHALER 18 MCG inhalation capsule PLACE 1 CAPSULE (18 MCG TOTAL) INTO INHALER AND INHALE DAILY. Patient taking differently: Place 18 mcg into inhaler and inhale daily.  06/03/15  Yes Lucille Passy, MD  triamcinolone cream (KENALOG) 0.1 % Apply 1 application topically  2 (two) times daily as needed (rash).   Yes [provider]  vitamin B-12 (CYANOCOBALAMIN) 1000 MCG tablet Take 1,000 mcg by mouth daily.   Yes [provider]      VITAL SIGNS:  Blood pressure (!) 209/48, pulse 73, temperature 98.7 F (37.1 C), temperature source Oral, resp. rate 17, height 5\' 4"  (1.626 m), weight 70.8 kg, SpO2 100 %.  PHYSICAL EXAMINATION:  GENERAL:  86 y.o.-year-old patient lying in the bed with no acute distress.  EYES: Pupils equal, round, reactive to light and accommodation. No scleral icterus. Extraocular muscles intact.  HEENT: Head atraumatic, normocephalic. Oropharynx and nasopharynx clear.  NECK:  Supple, no jugular venous distention. No thyroid enlargement.  Soreness palpating over left side of the neck.  Bilateral bruits. LUNGS: Decreased breath sounds bilaterally, no wheezing, rales,rhonchi or crepitation. No use of accessory muscles of respiration.  CARDIOVASCULAR: S1, S2 normal. No murmurs, rubs, or gallops.  Pulses equal bilateral upper extremities. ABDOMEN: Soft, nontender, nondistended. Bowel sounds present. No organomegaly or mass.  EXTREMITIES: Trace pedal edema.no cyanosis, or clubbing.  NEUROLOGIC: Cranial nerves II through XII are intact. Muscle strength 5/5 in all extremities. Sensation intact. Gait not checked.  PSYCHIATRIC: The patient is  alert and answers questions appropriately.  SKIN: No rash, lesion, or ulcer.   LABORATORY PANEL:   CBC Recent Labs  Lab 06/18/19 1017  WBC 5.2  HGB 10.7*  HCT 31.4*  PLT 150   ------------------------------------------------------------------------------------------------------------------  Chemistries  Recent Labs  Lab 06/18/19 1017  NA 142  K 4.6  CL 106  CO2 28  GLUCOSE 98  BUN 24*  CREATININE 1.20*  CALCIUM 8.9   ------------------------------------------------------------------------------------------------------------------  Cardiac Enzymes First 2 high-sensitivity troponins negative  RADIOLOGY:  DG Chest 2 View  Result Date: 06/18/2019 CLINICAL DATA:  Chest pain EXAM: CHEST - 2 VIEW COMPARISON:  May 10, 2017 FINDINGS: There is slight scarring right base. Lungs elsewhere are clear. Heart is upper normal in size with pulmonary vascularity normal. No adenopathy. There is aortic atherosclerosis. There is degenerative change in the thoracic spine. There is an old healed fracture of the proximal right humerus. IMPRESSION: Scarring right base. No edema or airspace opacity. Heart upper normal in size. Aortic Atherosclerosis (ICD10-I70.0). Prior fracture proximal right humerus with remodeling. Electronically Signed   By: Lowella Grip III M.D.   On: 06/18/2019 10:51    EKG:   Normal sinus rhythm 78 bpm.  No acute ST-T wave changes.  This was done after the neck pain started  IMPRESSION AND PLAN:   1.  Hypertensive urgency.  ER physician gave a dose of hydralazine and her usual lisinopril and a dose of Norvasc.  Blood pressure still high.  Will give as needed IV labetalol start oral labetalol and oral Norvasc and continue her lisinopril.  Admit to progressive care. 2.  Chest pain and neck pain.  We will get a CT angio of the neck to rule out dissection.  Cardiac enzymes x2 were negative but this was before the neck pain started I will get 2 more sets of cardiac enzymes.   Will obtain an echocardiogram. 3.  Hyperlipidemia unspecified on atorvastatin.  Check a lipid profile tomorrow. 4.  End-stage COPD on chronic oxygen continue nebulizers and inhalers 5.  Hypothyroidism unspecified on Synthroid 6.  History of CAD and TIA on Plavix 7.  Depression on Zoloft 8.  Cryoglobulinemia and polymyalgia rheumatica  All the records, laboratory and radiological data are reviewed and case discussed with ED  provider. Management plans discussed with the patient, family and they are in agreement.  The patient requires inpatient stay secondary to hypertensive urgency and likely will take a few days to bring the blood pressure down.  CODE STATUS: Patient changed her CODE STATUS to full code.  TOTAL TIME TAKING CARE OF THIS PATIENT: 55 minutes.    Loletha Grayer M.D on 06/18/2019 at 3:54 PM  Between 7am to 6pm - Pager - [phone]  After 6pm call admission pager [phone]  [facility]  CC: Primary care physician; Lucille Passy, MD

## 2019-06-18 NOTE — ED Notes (Signed)
Medications not given at this time due to meds not being verified by pharmacy

## 2019-06-18 NOTE — ED Triage Notes (Signed)
First Nurse Note:  Arrives via EMS from [facility]  C?O Chest Pain, SOB.  Per report initial room air sat 87% by staff, EMS initial sat 97%.  Chest pain has resolved, just feeling weak.

## 2019-06-18 NOTE — ED Notes (Signed)
Pt given meal tray at this time 

## 2019-06-18 NOTE — ED Notes (Signed)
Pt transported to CT ?

## 2019-06-18 NOTE — ED Provider Notes (Signed)
[facility] Emergency Department Provider Note   ____________________________________________    I have reviewed the triage vital signs and the nursing notes.   HISTORY  Chief Complaint Chest Pain     HPI Joy [name] is a 86 y.o. female with a history of Alzheimer's disease, CAD, COPD, hypertension who presents with complaints of chest pain and high blood pressure.  Apparently at nursing facility today she complained of chest discomfort, they checked her blood pressure and found it to be very elevated.  Currently she reports her chest pain is improved.  Daughter reports that she seems to be in good spirits as per usual.  Daughter reports history of high blood pressure  Past Medical History:  Diagnosis Date  . Alzheimer disease ([place])   . Anxiety   . CAD (coronary artery disease)    h/o NSTEMI, LAD stent placement 10/04, cath 05/11/2010  . Carotid artery occlusion   . COPD (chronic obstructive pulmonary disease) ([place])   . Depression   . Diastolic dysfunction    Echo 05/11/2010, EF 65-70%  . HLD (hyperlipidemia)   . HTN (hypertension)   . Hx of MELAS    MELAS CARRIER  . Hypertension   . Kidney infection   . PMR (polymyalgia rheumatica) ([facility])    with h/o steroid induced myopathy  . Shoulder fracture, right   . Thyroid disease   . TIA (transient ischemic attack)    11/05 s/p L CEA    Patient Active Problem List   Diagnosis Date Noted  . Alzheimer's dementia without behavioral disturbance ([place])   . Palliative care by specialist   . DNR (do not resuscitate)   . Goals of care, counseling/discussion   . Gabapentin-induced toxicity 04/04/2016  . Allergic rhinitis 11/04/2014  . Chronic diastolic heart failure ([place]) 07/22/2014  . Flash pulmonary edema ([place]) 06/30/2014  . CKD (chronic kidney disease) stage 3, GFR 30-59 ml/min 06/11/2014  . Carotid artery disease ([place]) 04/08/2014  . CAD (coronary artery disease)   . HLD (hyperlipidemia)   .  Lung nodule 01/15/2013  . Weakness generalized 12/26/2012  . Incontinence 12/26/2012  . GERD (gastroesophageal reflux disease) 09/06/2012  . Neuropathy 01/14/2011  . Insomnia 01/14/2011  . MELAS (mitochondrial encephalopathy, lactic acidosis and stroke-like episodes) ([place]) 11/02/2010  . HTN (hypertension)   . COPD (chronic obstructive pulmonary disease) ([place])   . Depression   . Thyroid disease   . Cryoglobulinemia ([place]) 07/18/2010  . Essential tremor 07/18/2010  . Open-angle glaucoma 07/18/2010  . Other specified forms of chronic ischemic heart disease 07/18/2010  . Renal cyst 07/18/2010  . Senile osteoporosis 07/18/2010  . Urge incontinence 07/18/2010    Past Surgical History:  Procedure Laterality Date  . CARDIAC CATHETERIZATION     [facility]  . CARDIAC CATHETERIZATION    . CARDIAC CATHETERIZATION    . CAROTID ENDARTERECTOMY     left with stent placement 11/2003  . ILIAC ARTERY STENT     right, 2001  . LUNG BIOPSY      Prior to Admission medications   Medication Sig Start Date End Date Taking? Authorizing Provider  acetaminophen (TYLENOL) 650 MG CR tablet Take 650 mg by mouth every 8 (eight) hours as needed for pain.   Yes [provider]  ADVAIR DISKUS 250-50 MCG/DOSE AEPB INHALE 1 PUFF BY MOUTH TWICE A DAY Patient taking differently: Inhale 1 puff into the lungs in the morning and at bedtime.  09/29/15  Yes Lucille Passy, MD  albuterol (  PROVENTIL) (2.5 MG/3ML) 0.083% nebulizer solution Take 2.5 mg by nebulization every 6 (six) hours as needed for wheezing or shortness of breath.   Yes [provider]  atorvastatin (LIPITOR) 20 MG tablet Take 20 mg by mouth at bedtime.    Yes [provider]  barrier cream (NON-SPECIFIED) CREA Apply 1 application topically as needed (rash). (apply to perineal area)   Yes [provider]  bisacodyl (DULCOLAX) 10 MG suppository Place 10 mg rectally daily as needed for moderate constipation.   Yes [provider]  bismuth subsalicylate (PEPTO BISMOL) 262 MG/15ML suspension Take 30 mLs by mouth daily as needed for diarrhea or loose stools.   Yes [provider]  clopidogrel (PLAVIX) 75 MG tablet Take 1 tablet (75 mg total) by mouth daily. 05/22/15  Yes Lucille Passy, MD  Dextromethorphan-guaiFENesin (TUSSIN DM MAX) 5-100 MG/5ML LIQD Take 10 mLs by mouth every 4 (four) hours as needed (cough).   Yes [provider]  ipratropium-albuterol (DUONEB) 0.5-2.5 (3) MG/3ML SOLN Take 3 mLs by nebulization every 4 (four) hours as needed (wheezing or dyspnea).    Yes [provider]  levothyroxine (SYNTHROID, LEVOTHROID) 25 MCG tablet Take 25 mcg by mouth at bedtime.    Yes [provider]  lisinopril (PRINIVIL,ZESTRIL) 10 MG tablet Take 1 tablet (10 mg total) by mouth daily. 07/15/15  Yes Wellington Hampshire, MD  memantine (NAMENDA) 10 MG tablet Take 10 mg by mouth 2 (two) times daily.   Yes [provider]  nitroGLYCERIN (NITROSTAT) 0.4 MG SL tablet Place 0.4 mg under the tongue every 5 (five) minutes as needed for chest pain.    Yes [provider]  ondansetron (ZOFRAN ODT) 4 MG disintegrating tablet Allow 1-2 tablets to dissolve in your mouth every 8 hours as needed for nausea/vomiting 01/31/18  Yes Hinda Kehr, MD  pantoprazole (PROTONIX) 20 MG tablet Take 20 mg by mouth daily.    Yes [provider]  polyethylene glycol (MIRALAX / GLYCOLAX) packet Take 17 g by mouth daily.   Yes [provider]  sertraline (ZOLOFT) 50 MG tablet Take 50 mg by mouth at bedtime.   Yes [provider]  SPIRIVA HANDIHALER 18 MCG inhalation capsule PLACE 1 CAPSULE (18 MCG TOTAL) INTO INHALER AND INHALE DAILY. Patient taking differently: Place 18 mcg into inhaler and inhale daily.  06/03/15  Yes Lucille Passy, MD  triamcinolone cream (KENALOG) 0.1 % Apply 1 application topically 2 (two) times daily as needed (rash).   Yes [provider]    vitamin B-12 (CYANOCOBALAMIN) 1000 MCG tablet Take 1,000 mcg by mouth daily.   Yes [provider]     Allergies Prednisone and Shellfish allergy  Family History  Problem Relation Age of Onset  . Arthritis Mother   . Arthritis Father   . Glaucoma Father   . Alzheimer's disease Brother   . Heart disease Brother   . Arthritis Brother   . Heart disease Brother   . Arthritis Brother   . Glaucoma Brother     Social History Social History   Tobacco Use  . Smoking status: Former Smoker    Packs/day: 1.00    Years: 25.00    Pack years: 25.00    Types: Cigarettes    Quit date: 01/05/1996    Years since quitting: 23.4  . Smokeless tobacco: Never Used  Substance Use Topics  . Alcohol use: No  . Drug use: No    Review of Systems  Constitutional: No fever/chills Eyes: No visual changes.  ENT: No sore throat. Cardiovascular: As above Respiratory: Denies shortness of breath. Gastrointestinal: No abdominal pain.  Genitourinary: Negative for dysuria. Musculoskeletal: Negative for back pain. Skin: Negative for rash. Neurological: Negative for headache   ____________________________________________   PHYSICAL EXAM:  VITAL SIGNS: ED Triage Vitals  Enc Vitals Group     BP 06/18/19 0956 (!) 227/61     Pulse Rate 06/18/19 0956 70     Resp 06/18/19 0956 18     Temp 06/18/19 0956 98.7 F (37.1 C)     Temp Source 06/18/19 0956 Oral     SpO2 06/18/19 0956 97 %     Weight 06/18/19 1000 70.8 kg (156 lb 1.4 oz)     Height 06/18/19 1000 1.626 m (5\' 4" )     Head Circumference --      Peak Flow --      Pain Score --      Pain Loc --      Pain Edu? --      Excl. in [place]? --     Constitutional: Alert. No acute distress. Pleasant and interactive Eyes: Conjunctivae are normal.  Head: Atraumatic. Nose: No congestion/rhinnorhea.  Cardiovascular: Normal rate, regular rhythm. Grossly normal heart sounds.  Good peripheral circulation. Respiratory: Normal respiratory  effort.  No retractions. Lungs CTAB. Gastrointestinal: Soft and nontender. No distention.    Musculoskeletal: No lower extremity tenderness nor edema.  Warm and well perfused Neurologic:  Normal speech and language. No gross focal neurologic deficits are appreciated.  Skin:  Skin is warm, dry and intact. No rash noted. Psychiatric: Mood and affect are normal. Speech and behavior are normal.  ____________________________________________   LABS (all labs ordered are listed, but only abnormal results are displayed)  Labs Reviewed  BASIC METABOLIC PANEL - Abnormal; Notable for the following components:      Result Value   BUN 24 (*)    Creatinine, Ser 1.20 (*)    GFR calc non Af Amer 41 (*)    GFR calc Af Amer 47 (*)    All other components within normal limits  CBC - Abnormal; Notable for the following components:   RBC 3.35 (*)    Hemoglobin 10.7 (*)    HCT 31.4 (*)    All other components within normal limits  URINALYSIS, COMPLETE (UACMP) WITH MICROSCOPIC - Abnormal; Notable for the following components:   Color, Urine YELLOW (*)    APPearance CLEAR (*)    Bacteria, UA RARE (*)    All other components within normal limits  SARS CORONAVIRUS 2 BY RT PCR (HOSPITAL ORDER, [place] [facility])  TROPONIN I (HIGH SENSITIVITY)  TROPONIN I (HIGH SENSITIVITY)  TROPONIN I (HIGH SENSITIVITY)   ____________________________________________  EKG  ED ECG REPORT I, Lavonia Drafts, the attending physician, personally viewed and interpreted this ECG.  Date: 06/18/2019  Rhythm: normal sinus rhythm QRS Axis: normal Intervals: normal ST/T Wave abnormalities: normal Narrative Interpretation: no evidence of acute ischemia  ____________________________________________  RADIOLOGY Chest x-ray viewed by me, no infiltrate or effusion ____________________________________________   PROCEDURES  Procedure(s) performed: No  Procedures   Critical Care performed:  No ____________________________________________   INITIAL IMPRESSION / ASSESSMENT AND PLAN / ED COURSE  Pertinent labs & imaging results that were available during my care of the patient were reviewed by me and considered in my medical decision making (see chart for details).  Patient presents with reports of chest pain, now resolved.  Noted to be markedly hypertensive although overall quite well-appearing on exam.  She does have a history of CAD.  Lab work today is overall reassuring, mild elevation in BUN/creatinine ratio suggesting dehydration, daughter reports the patient does not drink frequently.  Initial troponin is 9, will repeat.  Home blood pressure medications given with some improvement.  Second troponin is 11.  However after second troponin drawn patient did complain of chest pain with radiation into her left neck.  EKG notes time is reassuring.  Aspirin nitroglycerin given with improvement.  I discussed with the hospitalist for admission    ____________________________________________   FINAL CLINICAL IMPRESSION(S) / ED DIAGNOSES  Final diagnoses:  Chest pain, unspecified type  Essential hypertension        Note:  This document was prepared using Dragon voice recognition software and may include unintentional dictation errors.   Lavonia Drafts, MD 06/18/19 [phone]

## 2019-06-18 NOTE — ED Triage Notes (Signed)
Patient to ER from [facility] unit for c/o chest pain this am. Daughter states patient called on bell at facility for chest pain this am with feeling short of breath. Patient wears 2L O2 at all times. Patient denies any current chest pain, but c/o pain to muscles at back of neck. Patient unsure if she has been more weak lately. Daughter states patient's O2 sats dropped when staff at facility got patient up to walk, but that this is typical of patient if O2 is not increased via nasal cannula (which was not done).

## 2019-06-19 ENCOUNTER — Encounter: Payer: Self-pay | Admitting: Internal Medicine

## 2019-06-19 ENCOUNTER — Inpatient Hospital Stay (HOSPITAL_COMMUNITY)
Admit: 2019-06-19 | Discharge: 2019-06-19 | Disposition: A | Discharge status: Home / Self Care | Payer: Medicare Other | Attending: Internal Medicine | Admitting: Internal Medicine

## 2019-06-19 DIAGNOSIS — I5032 Chronic diastolic (congestive) heart failure: Secondary | ICD-10-CM

## 2019-06-19 DIAGNOSIS — I340 Nonrheumatic mitral (valve) insufficiency: Secondary | ICD-10-CM

## 2019-06-19 DIAGNOSIS — I2510 Atherosclerotic heart disease of native coronary artery without angina pectoris: Secondary | ICD-10-CM

## 2019-06-19 DIAGNOSIS — N1832 Chronic kidney disease, stage 3b: Secondary | ICD-10-CM

## 2019-06-19 DIAGNOSIS — Z8673 Personal history of transient ischemic attack (TIA), and cerebral infarction without residual deficits: Secondary | ICD-10-CM

## 2019-06-19 DIAGNOSIS — I471 Supraventricular tachycardia: Secondary | ICD-10-CM

## 2019-06-19 DIAGNOSIS — I350 Nonrheumatic aortic (valve) stenosis: Secondary | ICD-10-CM

## 2019-06-19 LAB — ECHOCARDIOGRAM COMPLETE
Height: 64 in
Weight: 2497.37 oz

## 2019-06-19 LAB — LIPID PANEL
Cholesterol: 110 mg/dL (ref 0–200)
HDL: 40 mg/dL — ABNORMAL LOW (ref >40)
LDL Cholesterol: 54 mg/dL (ref 0–99)
Total CHOL/HDL Ratio: 2.8 RATIO
Triglycerides: 82 mg/dL (ref <150)
VLDL: 16 mg/dL (ref 0–40)

## 2019-06-19 LAB — BASIC METABOLIC PANEL
Anion gap: 7 (ref 5–15)
BUN: 21 mg/dL (ref 8–23)
CO2: 27 mmol/L (ref 22–32)
Calcium: 8.4 mg/dL — ABNORMAL LOW (ref 8.9–10.3)
Chloride: 112 mmol/L — ABNORMAL HIGH (ref 98–111)
Creatinine, Ser: 1.13 mg/dL — ABNORMAL HIGH (ref 0.44–1.00)
GFR calc Af Amer: 51 mL/min — ABNORMAL LOW (ref >60)
GFR calc non Af Amer: 44 mL/min — ABNORMAL LOW (ref >60)
Glucose, Bld: 99 mg/dL (ref 70–99)
Potassium: 4.4 mmol/L (ref 3.5–5.1)
Sodium: 146 mmol/L — ABNORMAL HIGH (ref 135–145)

## 2019-06-19 LAB — CBC
HCT: 27.2 % — ABNORMAL LOW (ref 36.0–46.0)
Hemoglobin: 9.1 g/dL — ABNORMAL LOW (ref 12.0–15.0)
MCH: 31.6 pg (ref 26.0–34.0)
MCHC: 33.5 g/dL (ref 30.0–36.0)
MCV: 94.4 fL (ref 80.0–100.0)
Platelets: 135 10*3/uL — ABNORMAL LOW (ref 150–400)
RBC: 2.88 MIL/uL — ABNORMAL LOW (ref 3.87–5.11)
RDW: 15.4 % (ref 11.5–15.5)
WBC: 4.7 10*3/uL (ref 4.0–10.5)
nRBC: 0 % (ref 0.0–0.2)

## 2019-06-19 LAB — MRSA PCR SCREENING: MRSA by PCR: NEGATIVE

## 2019-06-19 LAB — TSH: TSH: 4.588 u[IU]/mL — ABNORMAL HIGH (ref 0.350–4.500)

## 2019-06-19 LAB — TROPONIN I (HIGH SENSITIVITY): Troponin I (High Sensitivity): 16 ng/L (ref <18)

## 2019-06-19 MED ORDER — SODIUM CHLORIDE 0.9% FLUSH
10.0000 mL | Freq: Two times a day (BID) | INTRAVENOUS | Status: DC
  Administered 2019-06-19: 10 mL via INTRAVENOUS

## 2019-06-19 MED ORDER — HYDRALAZINE HCL 10 MG PO TABS
10.0000 mg | ORAL_TABLET | Freq: Three times a day (TID) | ORAL | Status: DC
  Administered 2019-06-19 – 2019-06-20 (×3): 10 mg via ORAL
  Filled 2019-06-19 (×4): qty 1

## 2019-06-19 MED ORDER — LISINOPRIL 20 MG PO TABS: 20.0000 mg | ORAL_TABLET | Freq: Every day | ORAL | Status: DC

## 2019-06-19 MED ORDER — LISINOPRIL 10 MG PO TABS
10.0000 mg | ORAL_TABLET | Freq: Every day | ORAL | Status: DC
  Administered 2019-06-19: 10 mg via ORAL
  Filled 2019-06-19: qty 1

## 2019-06-19 MED ORDER — AMLODIPINE BESYLATE 10 MG PO TABS
10.0000 mg | ORAL_TABLET | Freq: Every day | ORAL | Status: DC
  Administered 2019-06-19 – 2019-06-20 (×2): 10 mg via ORAL
  Filled 2019-06-19 (×2): qty 1

## 2019-06-19 NOTE — Evaluation (Signed)
Physical Therapy Evaluation Patient Details Name: Joy [name] MRN: [id number] DOB: Mar 22, 1930 Today's Date: 06/19/2019   History of Present Illness  Pt admitted with chief complaint of constant L sided chest pain of 8/10 intensity, a little SOB with shallow breathing, with pain radiating up into neck.   Clinical Impression  Pt is a pleasant 86 y/o woman who is agreeable to PT evaluation. Pt was admitted from a memory care unit and may be a poor historian. PMH includes Alzheimer's disease, COPD, depression, HTN, HLD, carotid artery occlusion, CAD, and anxiety. Pt's SpO2 was monitored throughout session. Pt received on 2L via nasal cannula. Pt noted to desat to upper 80s with activity lying in bed indicating decreased endurance. Pt increased to 3L prior to standing activity, in which SpO2 decreased to 84%. O2 increased to 4L prior to ambulating 15 feet using RW with min A for steadying. Pt with decreased gait speed and forward flexed posture. Pt returned to 2L after resting in seated position and O2 sats remained in mid 90s. Ambulation distance limited to decreased functional activity tolerance and generalized weakness. Pt would benefit from continued skilled therapy at this time to address aforementioned deficits to return to PLOF.    Follow Up Recommendations Home health PT    Equipment Recommendations  Rolling walker with 5" wheels    Recommendations for Other Services       Precautions / Restrictions Precautions Precautions: Fall Restrictions Weight Bearing Restrictions: No      Mobility  Bed Mobility Overal bed mobility: Modified Independent             General bed mobility comments: increased time and use of bedrail with LUE  Transfers Overall transfer level: Needs assistance Equipment used: Rolling walker (2 wheeled) Transfers: Sit to/from Stand Sit to Stand: Min assist         General transfer comment: Pt able ti initiate transfer and required min A to come  into full upright standing.  Ambulation/Gait Ambulation/Gait assistance: Min assist Gait Distance (Feet): 15 Feet Assistive device: Rolling walker (2 wheeled) Gait Pattern/deviations: Step-through pattern     General Gait Details: Pt on 4L O2, ambulated with decreased gait speed and slightly flexed posture. Fatigue is limiting factor.  Stairs            Wheelchair Mobility    Modified Rankin (Stroke Patients Only)       Balance Overall balance assessment: Needs assistance Sitting-balance support: Bilateral upper extremity supported;Feet supported Sitting balance-Leahy Scale: Good Sitting balance - Comments: Pt able to maintain edge of bed sitting without physical assist. Supervision required for safety.    Standing balance support: Bilateral upper extremity supported Standing balance-Leahy Scale: Good Standing balance comment: Pt required CGA for safety with use of RW for BUE support.                             Pertinent Vitals/Pain Pain Assessment: No/denies pain    Home Living Family/patient expects to be discharged to:: Assisted living               Home Equipment: Walker - 4 wheels Additional Comments: Pt stated that she and her husband are both retired and they live outside of [place].    Prior Function Level of Independence: Independent with assistive device(s)   Gait / Transfers Assistance Needed: Unsure due to patient recollection.  ADL's / Homemaking Assistance Needed: Unsure due to patient recollection.  Comments: Pt  commented on living with husband and using a 4 wheeled walker when leaving the home.      Hand Dominance        Extremity/Trunk Assessment   Upper Extremity Assessment Upper Extremity Assessment: Generalized weakness (Generally 4/5 overall; 3+/5 for tricep strength)    Lower Extremity Assessment Lower Extremity Assessment: Generalized weakness (Generally 4-/5; L hip flexion 3/5, R hip flexion 3+/5)        Communication   Communication: No difficulties  Cognition Arousal/Alertness: Awake/alert Behavior During Therapy: WFL for tasks assessed/performed Overall Cognitive Status: History of cognitive impairments - at baseline                                 General Comments: Pt may be a poor historian. Alert to self, location, and situation, but unsure of month/year.      General Comments      Exercises Other Exercises Other Exercises: Pt performed heel slides x 10, quad sets x 10, and SLF x 5 reps bilaterally while lying in bed on 2L. Pt desat to 88% so O2 bumped up to 3L. Pt performed standing marches x10 using RW, with CGA for steadying, while on 3L. Pt desat to 84% so O2 increased to 4L/min via nasal cannula.    Assessment/Plan    PT Assessment Patient needs continued PT services  PT Problem List Decreased strength;Decreased activity tolerance;Decreased balance;Decreased mobility;Decreased cognition       PT Treatment Interventions DME instruction;Gait training;Functional mobility training;Therapeutic activities;Therapeutic exercise;Balance training;Cognitive remediation;Patient/family education    PT Goals (Current goals can be found in the Care Plan section)  Acute Rehab PT Goals Patient Stated Goal: to get better PT Goal Formulation: With patient Time For Goal Achievement: 07/03/19 Potential to Achieve Goals: Good    Frequency Min 2X/week   Barriers to discharge        Co-evaluation               AM-PAC PT "6 Clicks" Mobility  Outcome Measure Help needed turning from your back to your side while in a flat bed without using bedrails?: A Little Help needed moving from lying on your back to sitting on the side of a flat bed without using bedrails?: A Little Help needed moving to and from a bed to a chair (including a wheelchair)?: A Little Help needed standing up from a chair using your arms (e.g., wheelchair or bedside chair)?: A Little Help needed  to walk in hospital room?: A Little Help needed climbing 3-5 steps with a railing? : A Lot 6 Click Score: 17    End of Session Equipment Utilized During Treatment: Gait belt Activity Tolerance: Patient limited by fatigue Patient left: in chair;with call bell/phone within reach;with chair alarm set Nurse Communication: Other (comment) (need to be cleaned up from bowel movement) PT Visit Diagnosis: Unsteadiness on feet (R26.81);Muscle weakness (generalized) (M62.81);History of falling (Z91.81)    Time: 1001-1037 PT Time Calculation (min) (ACUTE ONLY): 36 min   Charges:             Vale Haven, SPT  Vale Haven 06/19/2019, 2:38 PM

## 2019-06-19 NOTE — Plan of Care (Signed)
  Problem: Health Behavior/Discharge Planning: Goal: Ability to manage health-related needs will improve Outcome: Progressing Note: Patient profile completed with the assistance of daughter,POA. Patient complained of chest discomfort but refused pain medication. No skin abnormalities noted.

## 2019-06-19 NOTE — Consult Note (Signed)
Cardiology Consultation:   Patient ID: Joy [name] MRN: [id number]; DOB: June 28, 1930  Admit date: 06/18/2019 Date of Consult: 06/19/2019  Primary Care Provider: Lucille Passy, MD Primary Cardiologist: Kathlyn Sacramento, MD  Primary Electrophysiologist:  None    Patient Profile:   Joy [name] is a 86 y.o. female with a hx of CAD s/p PCI to the LAD at [facility] (2004), CVA, carotid artery stenting, vascular dementia, PAD, HFpEF, hypertension, hyperlipidemia, and who is being seen today for the evaluation of HTN at the request of Dr. Leslye Peer.  History of Present Illness:   Joy [name] is an 86 year old female with PMH as above.  Unfortunately, she is a poor historian 2/2 vascular dementia.  She lives at [facility] with her husband.    Echo 2013 showed normal LVSF.  2015 MPI showed no evidence of ischemia and normal EF.  She has a history of bradycardia, resolving after stopping metoprolol.  Renal artery duplex 2016 was without evidence of renal artery stenosis.  2018 carotids showed mild b/l dz with recommendation for repeat studies in 1 year, not yet obtained.  She was seen in the office 07/15/2015.  It was noted Lisinopril was previously discontinued 2/2 soft BP and AKI after recurrent bronchitis, leading to PNA.  She had recently noted worsening LEE, as well as 5 to 6 pounds weight gain.  It was thought that amlodipine might be contributing. Lisinopril 10 mg qd was thus restarted with amlodipine decreased to 5 mg daily.  She had stable exertional DOE, as well as intermittent episodes of CP/aching with medical therapy recommended.  She was not very physically active with ambulation was limited by balance issues; therefore, she could only walk slowly with a walker. In 2017, she had recurrent UTIs and FTT noted (~40 lb weight loss) with improved appetite s/p Marinol.  She was admitted 04/2016 with ARF 2/2 dehydration and improved with IV fluids.   She was seen again 05/28/2016 with no changes in  her medical management. She was lost to follow-up.  Yesterday, 06/18/2019, she presented to [facility] with chest pain that started that same morning and subsequently radiated up her L neck.  Her nursing facility checked her BP and found it elevated, subsequently bring her to [facility] ED.  The CP was described as left-sided and constant, rated 8/10 in intensity.  No aggravating or alleviating factors identified.  Associated symptoms included SOB.  By her arrival to the ED, her chest pain had improved with residual soreness in her neck.  In the ED, vitals significant for BP 227/61, HR 70 bpm, RR 18, temperature 98.7 F, 97% on room air.  Labs showed creatinine 1.20, BUN 24, K 4.6, hemoglobin 10.7, RBC 3.35, hematocrit 31.4.  UA showed rare bacteria.  EKG was NSR without significant ST or T changes.  High-sensitivity troponin was 9  11  12   16.  Chest x-ray was without significant findings but showed scarring of the RLL with prior fx of the proximal R humerus. ASA and nitro administered.  CTA of the neck showed mild / 50% stenosis of the proximal right ICA due to calcified plaque and moderate stenosis of proximal right ECA.  The left carotid was noted to be s/p carotid endarterectomy and showed mild stenosis of the origin of the left CCA with surgical clips adjacent to the bifurcation.  Moderate to severe stenosis of the left subclavian distal to the vertebrae was noted.  In addition, severe emphysema and atherosclerosis of the aortic arch were noted.  Heart  Pathway Score:     Past Medical History:  Diagnosis Date  . Alzheimer disease ([place])   . Anxiety   . CAD (coronary artery disease)    h/o NSTEMI, LAD stent placement 10/04, cath 05/11/2010  . Carotid artery occlusion   . COPD (chronic obstructive pulmonary disease) ([place])   . Depression   . Diastolic dysfunction    Echo 05/11/2010, EF 65-70%  . HLD (hyperlipidemia)   . HTN (hypertension)   . Hx of MELAS    MELAS CARRIER  . Hypertension   . Kidney infection     . PMR (polymyalgia rheumatica) ([facility])    with h/o steroid induced myopathy  . Shoulder fracture, right   . Thyroid disease   . TIA (transient ischemic attack)    11/05 s/p L CEA    Past Surgical History:  Procedure Laterality Date  . CARDIAC CATHETERIZATION     [facility]  . CARDIAC CATHETERIZATION    . CARDIAC CATHETERIZATION    . CAROTID ENDARTERECTOMY     left with stent placement 11/2003  . CESAREAN SECTION    . ILIAC ARTERY STENT     right, 2001  . LUNG BIOPSY       Home Medications:  Prior to Admission medications   Medication Sig Start Date End Date Taking? Authorizing Provider  acetaminophen (TYLENOL) 650 MG CR tablet Take 650 mg by mouth every 8 (eight) hours as needed for pain.   Yes [provider]  ADVAIR DISKUS 250-50 MCG/DOSE AEPB INHALE 1 PUFF BY MOUTH TWICE A DAY Patient taking differently: Inhale 1 puff into the lungs in the morning and at bedtime.  09/29/15  Yes Lucille Passy, MD  albuterol (PROVENTIL) (2.5 MG/3ML) 0.083% nebulizer solution Take 2.5 mg by nebulization every 6 (six) hours as needed for wheezing or shortness of breath.   Yes [provider]  atorvastatin (LIPITOR) 20 MG tablet Take 20 mg by mouth at bedtime.    Yes [provider]  barrier cream (NON-SPECIFIED) CREA Apply 1 application topically as needed (rash). (apply to perineal area)   Yes [provider]  bisacodyl (DULCOLAX) 10 MG suppository Place 10 mg rectally daily as needed for moderate constipation.   Yes [provider]  bismuth subsalicylate (PEPTO BISMOL) 262 MG/15ML suspension Take 30 mLs by mouth daily as needed for diarrhea or loose stools.   Yes [provider]  clopidogrel (PLAVIX) 75 MG tablet Take 1 tablet (75 mg total) by mouth daily. 05/22/15  Yes Lucille Passy, MD  Dextromethorphan-guaiFENesin (TUSSIN DM MAX) 5-100 MG/5ML LIQD Take 10 mLs by mouth every 4 (four) hours as needed (cough).   Yes [provider]   ipratropium-albuterol (DUONEB) 0.5-2.5 (3) MG/3ML SOLN Take 3 mLs by nebulization every 4 (four) hours as needed (wheezing or dyspnea).    Yes [provider]  levothyroxine (SYNTHROID, LEVOTHROID) 25 MCG tablet Take 25 mcg by mouth at bedtime.    Yes [provider]  lisinopril (PRINIVIL,ZESTRIL) 10 MG tablet Take 1 tablet (10 mg total) by mouth daily. 07/15/15  Yes Wellington Hampshire, MD  memantine (NAMENDA) 10 MG tablet Take 10 mg by mouth 2 (two) times daily.   Yes [provider]  nitroGLYCERIN (NITROSTAT) 0.4 MG SL tablet Place 0.4 mg under the tongue every 5 (five) minutes as needed for chest pain.    Yes [provider]  ondansetron (ZOFRAN ODT) 4 MG disintegrating tablet Allow 1-2 tablets to dissolve in your mouth every 8  hours as needed for nausea/vomiting 01/31/18  Yes Hinda Kehr, MD  pantoprazole (PROTONIX) 20 MG tablet Take 20 mg by mouth daily.    Yes [provider]  polyethylene glycol (MIRALAX / GLYCOLAX) packet Take 17 g by mouth daily.   Yes [provider]  sertraline (ZOLOFT) 50 MG tablet Take 50 mg by mouth at bedtime.   Yes [provider]  SPIRIVA HANDIHALER 18 MCG inhalation capsule PLACE 1 CAPSULE (18 MCG TOTAL) INTO INHALER AND INHALE DAILY. Patient taking differently: Place 18 mcg into inhaler and inhale daily.  06/03/15  Yes Lucille Passy, MD  triamcinolone cream (KENALOG) 0.1 % Apply 1 application topically 2 (two) times daily as needed (rash).   Yes [provider]  vitamin B-12 (CYANOCOBALAMIN) 1000 MCG tablet Take 1,000 mcg by mouth daily.   Yes [provider]    Inpatient Medications: Scheduled Meds: . amLODipine  10 mg Oral Daily  . atorvastatin  20 mg Oral QHS  . clopidogrel  75 mg Oral Daily  . enoxaparin (LOVENOX) injection  40 mg Subcutaneous Q24H  . labetalol  100 mg Oral BID  . levothyroxine  25 mcg Oral QHS  . lisinopril  10 mg Oral Daily  . memantine  10 mg Oral BID   . mometasone-formoterol  2 puff Inhalation BID  . pantoprazole  20 mg Oral Daily  . polyethylene glycol  17 g Oral Daily  . sertraline  50 mg Oral QHS  . tiotropium  18 mcg Inhalation Daily  . vitamin B-12  1,000 mcg Oral Daily   Continuous Infusions: . sodium chloride 40 mL/hr at 06/19/19 0013   PRN Meds: acetaminophen **OR** acetaminophen, albuterol, labetalol, liver oil-zinc oxide, nitroGLYCERIN, ondansetron **OR** ondansetron (ZOFRAN) IV  Allergies:    Allergies  Allergen Reactions  . Prednisone Other (See Comments)    Weight gain,puffy face  . Shellfish Allergy Other (See Comments)    Unknown    Social History:   Social History   Socioeconomic History  . Marital status: Married    Spouse name: Not on file  . Number of children: Not on file  . Years of education: Not on file  . Highest education level: Not on file  Occupational History  . Occupation: retired    Comment: Pharmacist, hospital  Tobacco Use  . Smoking status: Former Smoker    Packs/day: 1.00    Years: 25.00    Pack years: 25.00    Types: Cigarettes    Quit date: 01/05/1996    Years since quitting: 23.4  . Smokeless tobacco: Never Used  Substance and Sexual Activity  . Alcohol use: No  . Drug use: No  . Sexual activity: Never  Other Topics Concern  . Not on file  Social History Narrative   Lives with husband, [name] at [place].   Daughters, [name] and [name] very involved.   Social Determinants of Health   Financial Resource Strain:   . Difficulty of Paying Living Expenses:   Food Insecurity:   . Worried About Charity fundraiser in the Last Year:   . Arboriculturist in the Last Year:   Transportation Needs:   . Film/video editor (Medical):   Marland Kitchen Lack of Transportation (Non-Medical):   Physical Activity:   . Days of Exercise per Week:   . Minutes of Exercise per Session:   Stress:   . Feeling of Stress :   Social Connections:   . Frequency of Communication with  Friends and Family:   .  Frequency of Social Gatherings with Friends and Family:   . Attends Religious Services:   . Active Member of Clubs or Organizations:   . Attends Archivist Meetings:   Marland Kitchen Marital Status:   Intimate Partner Violence:   . Fear of Current or Ex-Partner:   . Emotionally Abused:   Marland Kitchen Physically Abused:   . Sexually Abused:     Family History:    Family History  Problem Relation Age of Onset  . Arthritis Mother   . HIV Mother   . Arthritis Father   . Glaucoma Father   . CAD Father   . Alzheimer's disease Brother   . Heart disease Brother   . Arthritis Brother   . Heart disease Brother   . Arthritis Brother   . Glaucoma Brother      ROS:  Please see the history of present illness.  Review of Systems  Unable to perform ROS: Dementia  Respiratory: Positive for shortness of breath.        Mainly DOE  Cardiovascular: Negative for chest pain, leg swelling and PND.  Gastrointestinal: Negative for abdominal pain, blood in stool, melena and nausea.  Genitourinary: Negative for hematuria.  Musculoskeletal: Positive for neck pain. Negative for falls and myalgias.  Neurological: Negative for dizziness.  All other systems reviewed and are negative.   All other ROS reviewed and negative.     Physical Exam/Data:   Vitals:   06/19/19 0019 06/19/19 0402 06/19/19 0800 06/19/19 0817  BP: (!) 176/48 (!) 142/128 (!) 158/46   Pulse: 65 (!) 51 (!) 58   Resp:      Temp:  97.6 F (36.4 C)  98 F (36.7 C)  TempSrc:  Oral  Oral  SpO2:  98%    Weight:      Height:        Intake/Output Summary (Last 24 hours) at 06/19/2019 0858 Last data filed at 06/19/2019 0700 Gross per 24 hour  Intake 1047.06 ml  Output 0 ml  Net 1047.06 ml   Last 3 Weights 06/18/2019 08/15/2018 01/30/2018  Weight (lbs) 156 lb 1.4 oz 156 lb 160 lb 7.9 oz  Weight (kg) 70.8 kg 70.761 kg 72.8 kg     Body mass index is 26.79 kg/m.  General:  Well nourished, well developed, in no acute distress HEENT:  normal Neck: no JVD Vascular: mild R carotid bruit; radial pulses 2+ bilaterally Cardiac:  normal S1, S2; RRR; no murmur  Lungs:  clear to auscultation bilaterally, no wheezing, rhonchi or rales  Abd: soft, nontender, no hepatomegaly  Ext: no edema Musculoskeletal:  No deformities, BUE and BLE strength normal and equal Skin: warm and dry  Neuro:  No focal abnormalities noted Psych:  Normal affect   EKG:  The EKG was personally reviewed and demonstrates: NSR, 64bpm, Q waves in lateral leads, RAD. Q waves new when compared with 01/2018 EKG. Telemetry:  Telemetry was personally reviewed and demonstrates:  NSR/ Sinus bradycardia with brief runs of SVT  Relevant CV Studies: Pending echo  US 06/16/2016 See studies tab  2016 renal artery study Asymmetrical kidney size, left longer than right for nearly 2 cm in length, normal caliber abdominal aorta, normal renal arteries bilaterally, IVC and renal arteries patent, technically challenging study  MPI 02/05/2013 Low risk  Echo 2013 LVSF greater than 55%, RVSP elevated at 30 to 40 mmHg.  Mild TR.  Trace AR.   RV EVDd 2.8 cm, IVSD 1.3  cm, LVIDd 4.1 cm, LVIDS 2.8 cm, LVP WB 1.0 cm, FS 31%, EF 59%, aortic root diameter 2.6 cm, ACS 1.3 cm, LA dimension 3.9 cm, LVOT   Laboratory Data:  High Sensitivity Troponin:   Recent Labs  Lab 06/18/19 1017 06/18/19 1320 06/18/19 1536 06/19/19 0446  TROPONINIHS 9 11 12 16      Cardiac EnzymesNo results for input(s): TROPONINI in the last 168 hours. No results for input(s): TROPIPOC in the last 168 hours.  Chemistry Recent Labs  Lab 06/18/19 1017 06/19/19 0446  NA 142 146*  K 4.6 4.4  CL 106 112*  CO2 28 27  GLUCOSE 98 99  BUN 24* 21  CREATININE 1.20* 1.13*  CALCIUM 8.9 8.4*  GFRNONAA 41* 44*  GFRAA 47* 51*  ANIONGAP 8 7    No results for input(s): PROT, ALBUMIN, AST, ALT, ALKPHOS, BILITOT in the last 168 hours. Hematology Recent Labs  Lab 06/18/19 1017 06/19/19 0446  WBC 5.2 4.7   RBC 3.35* 2.88*  HGB 10.7* 9.1*  HCT 31.4* 27.2*  MCV 93.7 94.4  MCH 31.9 31.6  MCHC 34.1 33.5  RDW 15.3 15.4  PLT 150 135*   BNPNo results for input(s): BNP, PROBNP in the last 168 hours.  DDimer No results for input(s): DDIMER in the last 168 hours.   Radiology/Studies:  DG Chest 2 View  Result Date: 06/18/2019 CLINICAL DATA:  Chest pain EXAM: CHEST - 2 VIEW COMPARISON:  May 10, 2017 FINDINGS: There is slight scarring right base. Lungs elsewhere are clear. Heart is upper normal in size with pulmonary vascularity normal. No adenopathy. There is aortic atherosclerosis. There is degenerative change in the thoracic spine. There is an old healed fracture of the proximal right humerus. IMPRESSION: Scarring right base. No edema or airspace opacity. Heart upper normal in size. Aortic Atherosclerosis (ICD10-I70.0). Prior fracture proximal right humerus with remodeling. Electronically Signed   By: Lowella Grip III M.D.   On: 06/18/2019 10:51   CT ANGIO NECK W OR WO CONTRAST  Result Date: 06/18/2019 CLINICAL DATA:  Neck pain. Left chest pain. History of left carotid endarterectomy 2005 EXAM: CT ANGIOGRAPHY NECK TECHNIQUE: Multidetector CT imaging of the neck was performed using the standard protocol during bolus administration of intravenous contrast. Multiplanar CT image reconstructions and MIPs were obtained to evaluate the vascular anatomy. Carotid stenosis measurements (when applicable) are obtained utilizing NASCET criteria, using the distal internal carotid diameter as the denominator. CONTRAST:  19mL OMNIPAQUE IOHEXOL 350 MG/ML SOLN COMPARISON:  None. FINDINGS: Aortic arch: Atherosclerotic calcification throughout the aortic arch and proximal great vessels. Mild stenosis proximal left common carotid artery and left subclavian artery. Moderate to severe stenosis left subclavian artery distal to the vertebral. Right carotid system: Mild atherosclerotic calcification right carotid bifurcation.  Calcified plaque right carotid bulb with 50% diameter stenosis of the proximal right internal carotid artery. Moderate stenosis proximal right external carotid artery. Left carotid system: Mild stenosis origin of left common carotid artery. Prior left carotid endarterectomy with surgical clips adjacent to the bifurcation. No recurrent stenosis or plaque. Vertebral arteries: Both vertebral arteries widely patent to the basilar without significant stenosis. Skeleton: Disc and facet degeneration in the cervical spine. No acute skeletal lesion. Other neck: Negative for mass or adenopathy. Limited intracranial imaging in the posterior fossa negative. Upper chest: Severe emphysema without acute abnormality in the lung apices. Atherosclerotic aortic arch. IMPRESSION: 1. 50% diameter stenosis proximal right internal carotid artery due to calcific plaque. Moderate stenosis proximal right external carotid artery  2. Postop left carotid endarterectomy without recurrent stenosis or plaque. 3. Both vertebral arteries widely patent 4. Moderate to severe stenosis left subclavian distal to the vertebral. 5. Aortic Atherosclerosis (ICD10-I70.0) and Emphysema (ICD10-J43.9). Electronically Signed   By: Franchot Gallo M.D.   On: 06/18/2019 17:35    Assessment and Plan:    Hypertensive urgency --Still sub-optimal at 158/46 with HR 58bpm but significantly since admitted at 227/61. Known history of bradycardia on BB, though she reports she is asx with bradycardic rate at this time. --Echo pending. CTA without acute findings. TSH ordered. Current renal function at baseline and electrolytes stable. --Continue amlodipine 10mg  daily and lisinopril 10mg  daily. Continue PRN IV hydralazine and IV labetalol. Recommend up-titrate lisinopril to 20mg  daily with repeat BMET in the AM. She is currently on labetalol 100mg  BID. Consider transition from labetalol to Coreg before discharge, given her history of CAD as below and given this will have  greater influence on BP than HR.   CAD s/p PCI to LAD --No current CP but does note L sided CP. --HS Tn minimally elevated, flat trending. EKG without current ST/T changes. Not consistent with ACS.  --Continue medical management with ASA, Plavix, ACE, BB, statin, and PRN SL nitro.  --Recommend transition labetalol to Coreg before discharge given h/o CAD and with increase of lisinopril to 20mg  daily as tolerated by renal function.  --Ideally, would benefit from further ischemic workup with stress testing given earlier CP and history of CAD with EKG as above. Echo pending to assess if EF reduced or acute WMA. Further recommendations at that time.   SVT --Reports asx.  --Brief runs on telemetry and not noted in the past.  --Consider Zio XT at discharge to assess burden.  --As above, would benefit from stress testing in the near future if patient and family agreeable. Echo pending.  --Will need to assess chronotropic competence before discharge by ambulating the patient.   Chronic HFpEF --Reports breathing at baseline. Uses O2 at SNF but unsure of the L of oxygen.  --Echo pending. --Monitor I/O, daily standing weights, daily BMET.  --Euvolemic and well compensated on exam. She has not been on a diuretic in the past. No current indication for a diuretic.  --Continue medical management.  HLD --Continue statin.  History of  TIA --Continue Plavix   For questions or updates, please contact [place] Please consult [email or web address] for contact info under     Signed, Arvil Chaco, PA-C  06/19/2019 8:58 AM

## 2019-06-19 NOTE — Progress Notes (Signed)
*  PRELIMINARY RESULTS* Echocardiogram 2D Echocardiogram has been performed.  Joy [name] 06/19/2019, 9:38 AM

## 2019-06-19 NOTE — Progress Notes (Signed)
Patient ID: Joy [name], female   DOB: 1930/08/16, 86 y.o.   MRN: [id number] [facility] PROGRESS NOTE  Joy [name] PIR:[id number] DOB: December 11, 1930 DOA: 06/18/2019 PCP: Lucille Passy, MD  HPI/Subjective: Patient came in with chest pain and had 2 cardiac enzymes that were negative then developed neck pain.  Her blood pressure was very elevated in the emergency room and hospitalist services were contacted for admission.  Patient does not have any chest pain today numerous medications were given to try to control blood pressure.  Objective: Vitals:   06/19/19 1002 06/19/19 1202  BP: (!) 159/48 (!) 177/44  Pulse: (!) 58 (!) 53  Resp:  19  Temp:  98.2 F (36.8 C)  SpO2:  96%    Intake/Output Summary (Last 24 hours) at 06/19/2019 1438 Last data filed at 06/19/2019 0700 Gross per 24 hour  Intake 1047.06 ml  Output 0 ml  Net 1047.06 ml   Filed Weights   06/18/19 1000  Weight: 70.8 kg    ROS: Review of Systems  Constitutional: Negative for fever.  Eyes: Negative for blurred vision.  Respiratory: Negative for cough and shortness of breath.   Cardiovascular: Negative for chest pain.  Gastrointestinal: Negative for abdominal pain, nausea and vomiting.  Genitourinary: Negative for dysuria.  Musculoskeletal: Positive for neck pain. Negative for joint pain.  Neurological: Negative for dizziness and headaches.   Exam: Physical Exam  HENT:  Nose: No mucosal edema.  Mouth/Throat: No oropharyngeal exudate.  Eyes: Pupils are equal, round, and reactive to light. Conjunctivae and lids are normal.  Neck: No JVD present. Carotid bruit is not present. No thyroid mass and no thyromegaly present.  Cardiovascular: S1 normal and S2 normal. Exam reveals no gallop.  No murmur heard. Respiratory: No respiratory distress. She has no wheezes. She has no rhonchi. She has no rales.  GI: Soft. Bowel sounds are normal. There is no abdominal tenderness.  Musculoskeletal:     Cervical back:  No edema.     Right ankle: Swelling present.     Left ankle: Swelling present.  Lymphadenopathy:    She has no cervical adenopathy.  Neurological: She is alert. No cranial nerve deficit.  Skin: Skin is warm. No rash noted. Nails show no clubbing.      Data Reviewed: Basic Metabolic Panel: Recent Labs  Lab 06/18/19 1017 06/19/19 0446  NA 142 146*  K 4.6 4.4  CL 106 112*  CO2 28 27  GLUCOSE 98 99  BUN 24* 21  CREATININE 1.20* 1.13*  CALCIUM 8.9 8.4*   CBC: Recent Labs  Lab 06/18/19 1017 06/19/19 0446  WBC 5.2 4.7  HGB 10.7* 9.1*  HCT 31.4* 27.2*  MCV 93.7 94.4  PLT 150 135*     Recent Results (from the past 240 hour(s))  SARS Coronavirus 2 by RT PCR (hospital order, performed in [facility] hospital lab) Nasopharyngeal Nasopharyngeal Swab     Status: None   Collection Time: 06/18/19  3:36 PM   Specimen: Nasopharyngeal Swab  Result Value Ref Range Status   SARS Coronavirus 2 NEGATIVE NEGATIVE Final    Comment: (NOTE) SARS-CoV-2 target nucleic acids are NOT DETECTED.  The SARS-CoV-2 RNA is generally detectable in upper and lower respiratory specimens during the acute phase of infection. The lowest concentration of SARS-CoV-2 viral copies this assay can detect is 250 copies / mL. A negative result does not preclude SARS-CoV-2 infection and should not be used as the sole basis for treatment or other patient management decisions.  A negative result may occur with improper specimen collection / handling, submission of specimen other than nasopharyngeal swab, presence of viral mutation(s) within the areas targeted by this assay, and inadequate number of viral copies (<250 copies / mL). A negative result must be combined with clinical observations, patient history, and epidemiological information.  Fact Sheet for Patients:   StrictlyIdeas.no  Fact Sheet for Healthcare Providers: BankingDealers.co.za  This test is  not yet approved or  cleared by the [place] FDA and has been authorized for detection and/or diagnosis of SARS-CoV-2 by FDA under an Emergency Use Authorization (EUA).  This EUA will remain in effect (meaning this test can be used) for the duration of the COVID-19 declaration under Section 564(b)(1) of the Act, 21 U.S.C. section 360bbb-3(b)(1), unless the authorization is terminated or revoked sooner.  Performed at [facility], [address] [place], [place] [address]   MRSA PCR Screening     Status: None   Collection Time: 06/19/19 12:16 AM   Specimen: Nasal Mucosa; Nasopharyngeal  Result Value Ref Range Status   MRSA by PCR NEGATIVE NEGATIVE Final    Comment:        The GeneXpert MRSA Assay (FDA approved for NASAL specimens only), is one component of a comprehensive MRSA colonization surveillance program. It is not intended to diagnose MRSA infection nor to guide or monitor treatment for MRSA infections. Performed at [facility], [address], [place], [place] [address]      Studies: DG Chest 2 View  Result Date: 06/18/2019 CLINICAL DATA:  Chest pain EXAM: CHEST - 2 VIEW COMPARISON:  May 10, 2017 FINDINGS: There is slight scarring right base. Lungs elsewhere are clear. Heart is upper normal in size with pulmonary vascularity normal. No adenopathy. There is aortic atherosclerosis. There is degenerative change in the thoracic spine. There is an old healed fracture of the proximal right humerus. IMPRESSION: Scarring right base. No edema or airspace opacity. Heart upper normal in size. Aortic Atherosclerosis (ICD10-I70.0). Prior fracture proximal right humerus with remodeling. Electronically Signed   By: Lowella Grip III M.D.   On: 06/18/2019 10:51   CT ANGIO NECK W OR WO CONTRAST  Result Date: 06/18/2019 CLINICAL DATA:  Neck pain. Left chest pain. History of left carotid endarterectomy 2005 EXAM: CT ANGIOGRAPHY NECK TECHNIQUE: Multidetector CT  imaging of the neck was performed using the standard protocol during bolus administration of intravenous contrast. Multiplanar CT image reconstructions and MIPs were obtained to evaluate the vascular anatomy. Carotid stenosis measurements (when applicable) are obtained utilizing NASCET criteria, using the distal internal carotid diameter as the denominator. CONTRAST:  33mL OMNIPAQUE IOHEXOL 350 MG/ML SOLN COMPARISON:  None. FINDINGS: Aortic arch: Atherosclerotic calcification throughout the aortic arch and proximal great vessels. Mild stenosis proximal left common carotid artery and left subclavian artery. Moderate to severe stenosis left subclavian artery distal to the vertebral. Right carotid system: Mild atherosclerotic calcification right carotid bifurcation. Calcified plaque right carotid bulb with 50% diameter stenosis of the proximal right internal carotid artery. Moderate stenosis proximal right external carotid artery. Left carotid system: Mild stenosis origin of left common carotid artery. Prior left carotid endarterectomy with surgical clips adjacent to the bifurcation. No recurrent stenosis or plaque. Vertebral arteries: Both vertebral arteries widely patent to the basilar without significant stenosis. Skeleton: Disc and facet degeneration in the cervical spine. No acute skeletal lesion. Other neck: Negative for mass or adenopathy. Limited intracranial imaging in the posterior fossa negative. Upper chest: Severe emphysema without acute  abnormality in the lung apices. Atherosclerotic aortic arch. IMPRESSION: 1. 50% diameter stenosis proximal right internal carotid artery due to calcific plaque. Moderate stenosis proximal right external carotid artery 2. Postop left carotid endarterectomy without recurrent stenosis or plaque. 3. Both vertebral arteries widely patent 4. Moderate to severe stenosis left subclavian distal to the vertebral. 5. Aortic Atherosclerosis (ICD10-I70.0) and Emphysema (ICD10-J43.9).  Electronically Signed   By: Franchot Gallo M.D.   On: 06/18/2019 17:35   ECHOCARDIOGRAM COMPLETE  Result Date: 06/19/2019    ECHOCARDIOGRAM REPORT   Patient Name:   Joy [name] Date of Exam: 06/19/2019 Medical Rec #:  [id number]        Height:       64.0 in Accession #:    [id number]       Weight:       156.1 lb Date of Birth:  Mar 14, 1930        BSA:          1.761 m Patient Age:    55 years         BP:           158/46 mmHg Patient Gender: F                HR:           59 bpm. Exam Location:  [facility] Procedure: 2D Echo, Color Doppler and Cardiac Doppler Indications:     R07.9 Chest Pain  History:         Patient has no prior history of Echocardiogram examinations.                  TIA and COPD; Risk Factors:Hypertension and Dyslipidemia.  Sonographer:     Charmayne Sheer RDCS (AE) Referring Phys:  [id number] Loletha Grayer Diagnosing Phys: Nelva Bush MD IMPRESSIONS  1. Left ventricular ejection fraction, by estimation, is 65 to 70%. The left ventricle has normal function. The left ventricle has no regional wall motion abnormalities. There is mild asymmetric left ventricular hypertrophy of the basal-septal segment. Left ventricular diastolic parameters are consistent with Grade II diastolic dysfunction (pseudonormalization). Elevated left atrial pressure.  2. Right ventricular systolic function is normal. The right ventricular size is normal. Mildly increased right ventricular wall thickness. Tricuspid regurgitation signal is inadequate for assessing PA pressure.  3. The mitral valve is degenerative. Mild mitral valve regurgitation. No evidence of mitral stenosis.  4. The aortic valve has an indeterminant number of cusps. Aortic valve regurgitation is not visualized. Mild to moderate aortic valve stenosis. Aortic valve area, by VTI measures 1.03 cm. Aortic valve mean gradient measures 17.0 mmHg.  5. The inferior vena cava is normal in size with greater than 50% respiratory variability, suggesting right atrial  pressure of 3 mmHg. FINDINGS  Left Ventricle: Left ventricular ejection fraction, by estimation, is 65 to 70%. The left ventricle has normal function. The left ventricle has no regional wall motion abnormalities. The left ventricular internal cavity size was normal in size. There is  mild asymmetric left ventricular hypertrophy of the basal-septal segment. Left ventricular diastolic parameters are consistent with Grade II diastolic dysfunction (pseudonormalization). Elevated left atrial pressure. Right Ventricle: The right ventricular size is normal. Mildly increased right ventricular wall thickness. Right ventricular systolic function is normal. Tricuspid regurgitation signal is inadequate for assessing PA pressure. Left Atrium: Left atrial size was normal in size. Right Atrium: Right atrial size was normal in size. Pericardium: There is no evidence of pericardial effusion. Mitral Valve: The mitral valve  is degenerative in appearance. There is mild thickening of the posterior mitral valve leaflet(s). There is moderate calcification of the posterior mitral valve leaflet(s). Mild mitral annular calcification. Mild mitral valve regurgitation. No evidence of mitral valve stenosis. MV peak gradient, 8.4 mmHg. The mean mitral valve gradient is 3.0 mmHg. Tricuspid Valve: The tricuspid valve is not well visualized. Tricuspid valve regurgitation is trivial. Aortic Valve: The aortic valve has an indeterminant number of cusps. . There is mild thickening and mild calcification of the aortic valve. Aortic valve regurgitation is not visualized. Mild to moderate aortic stenosis is present. There is mild thickening of the aortic valve. There is mild calcification of the aortic valve. Aortic valve mean gradient measures 17.0 mmHg. Aortic valve peak gradient measures 32.3 mmHg. Aortic valve area, by VTI measures 1.03 cm. Pulmonic Valve: The pulmonic valve was not well visualized. Pulmonic valve regurgitation is not visualized. No  evidence of pulmonic stenosis. Aorta: The aortic root is normal in size and structure. Pulmonary Artery: The pulmonary artery is not well seen. Venous: The inferior vena cava is normal in size with greater than 50% respiratory variability, suggesting right atrial pressure of 3 mmHg. IAS/Shunts: No atrial level shunt detected by color flow Doppler.  LEFT VENTRICLE PLAX 2D LVIDd:         3.68 cm  Diastology LVIDs:         1.78 cm  LV e' lateral:   4.90 cm/s LV PW:         1.06 cm  LV E/e' lateral: 29.2 LV IVS:        0.87 cm  LV e' medial:    4.79 cm/s LVOT diam:     1.70 cm  LV E/e' medial:  29.9 LV SV:         68 LV SV Index:   39 LVOT Area:     2.27 cm  RIGHT VENTRICLE RV Basal diam:  3.35 cm LEFT ATRIUM             Index       RIGHT ATRIUM           Index LA diam:        4.30 cm 2.44 cm/m  RA Area:     16.10 cm LA Vol (A2C):   50.7 ml 28.79 ml/m RA Volume:   39.20 ml  22.26 ml/m LA Vol (A4C):   52.3 ml 29.70 ml/m LA Biplane Vol: 54.0 ml 30.67 ml/m  AORTIC VALVE                    PULMONIC VALVE AV Area (Vmax):    0.82 cm     PV Vmax:       1.32 m/s AV Area (Vmean):   0.97 cm     PV Vmean:      84.800 cm/s AV Area (VTI):     1.03 cm     PV VTI:        0.298 m AV Vmax:           284.00 cm/s  PV Peak grad:  7.0 mmHg AV Vmean:          180.000 cm/s PV Mean grad:  3.0 mmHg AV VTI:            0.659 m AV Peak Grad:      32.3 mmHg AV Mean Grad:      17.0 mmHg LVOT Vmax:         103.00 cm/s  LVOT Vmean:        76.900 cm/s LVOT VTI:          0.300 m LVOT/AV VTI ratio: 0.46  AORTA Ao Root diam: 2.70 cm MITRAL VALVE MV Area (PHT): 3.17 cm     SHUNTS MV Peak grad:  8.4 mmHg     Systemic VTI:  0.30 m MV Mean grad:  3.0 mmHg     Systemic Diam: 1.70 cm MV Vmax:       1.45 m/s MV Vmean:      77.5 cm/s MV Decel Time: 239 msec MV E velocity: 143.00 cm/s MV A velocity: 122.00 cm/s MV E/A ratio:  1.17 Nelva Bush MD Electronically signed by Nelva Bush MD Signature Date/Time: 06/19/2019/1:11:06 PM    Final      Scheduled Meds: . amLODipine  10 mg Oral Daily  . atorvastatin  20 mg Oral QHS  . clopidogrel  75 mg Oral Daily  . enoxaparin (LOVENOX) injection  40 mg Subcutaneous Q24H  . hydrALAZINE  10 mg Oral Q8H  . levothyroxine  25 mcg Oral QHS  . lisinopril  10 mg Oral Daily  . memantine  10 mg Oral BID  . mometasone-formoterol  2 puff Inhalation BID  . pantoprazole  20 mg Oral Daily  . polyethylene glycol  17 g Oral Daily  . sertraline  50 mg Oral QHS  . tiotropium  18 mcg Inhalation Daily  . vitamin B-12  1,000 mcg Oral Daily   Continuous Infusions: . sodium chloride 40 mL/hr at 06/19/19 0013    Assessment/Plan:  1. Hypertensive urgency.  Blood pressure pending pretty variable during the hospital course.  Continue lisinopril.  I added Norvasc and titrated up to 10 mg.  Cardiology discontinued the beta-blocker since relative bradycardia.  I will add low-dose hydralazine.  Continue to monitor blood pressure. 2. Chest pain and neck pain.  Chest pain has resolved.  4 sets of cardiac enzymes are flat.  Cardiology and family does not want to do anything invasive at this time.  Echocardiogram showed a normal ejection fraction.  The patient does still have a little neck pain.  CT angio of the neck ruled out dissection.  Patient does have some plaque on the right side and prior carotid endarterectomy on the left. 3. Hyperlipidemia unspecified on atorvastatin.  LDL excellent at 54. 4. End-stage COPD on chronic oxygen.  Continue nebulizers and inhalers 5. Hypothyroidism unspecified on Synthroid 6. History of CAD and TIA on Plavix 7. Depression on Zoloft 8. History of cryoglobulinemia and polymyalgia rheumatica. 9. Weakness.  Physical therapy evaluation 10. Chronic kidney disease stage IIIb    Code Status:     Code Status Orders  (From admission, onward)         Start     Ordered   06/18/19 1914  Full code  Continuous        06/18/19 1914        Code Status History    Date Active  Date Inactive Code Status Order ID Comments User Context   04/06/2016 1457 04/07/2016 1412 DNR [id number]  Basilio Cairo, NP Inpatient   04/04/2016 2101 04/06/2016 1457 Full Code [id number]  Theodoro Grist, MD Inpatient   10/04/2015 0400 10/06/2015 1859 Full Code [id number]  Saundra Shelling, MD Inpatient   Advance Care Planning Activity    Advance Directive Documentation     Most Recent Value  Type of Advance Directive Healthcare Power of Attorney  Pre-existing out of facility DNR order (  yellow form or pink MOST form) Yellow form placed in chart (order not valid for inpatient use)  "MOST" Form in Place? --     Family Communication: Spoke with daughter at the bedside Disposition Plan: Status is: Inpatient  Dispo: The patient is from: Memory care unit              Anticipated d/c is to: Memory care unit              Anticipated d/c date is: Potential 06/20/2019 depending on where the blood pressure is              Patient currently being treated for hypertensive urgency.  Titrating oral medications.  Consultants:  Cardiology  Time spent: 27 minutes, case discussed with cardiology  [place]

## 2019-06-20 DIAGNOSIS — I161 Hypertensive emergency: Principal | ICD-10-CM

## 2019-06-20 LAB — BASIC METABOLIC PANEL
Anion gap: 8 (ref 5–15)
BUN: 20 mg/dL (ref 8–23)
CO2: 27 mmol/L (ref 22–32)
Calcium: 8.5 mg/dL — ABNORMAL LOW (ref 8.9–10.3)
Chloride: 110 mmol/L (ref 98–111)
Creatinine, Ser: 1.16 mg/dL — ABNORMAL HIGH (ref 0.44–1.00)
GFR calc Af Amer: 49 mL/min — ABNORMAL LOW (ref >60)
GFR calc non Af Amer: 43 mL/min — ABNORMAL LOW (ref >60)
Glucose, Bld: 86 mg/dL (ref 70–99)
Potassium: 4.1 mmol/L (ref 3.5–5.1)
Sodium: 145 mmol/L (ref 135–145)

## 2019-06-20 MED ORDER — LISINOPRIL 20 MG PO TABS
20.0000 mg | ORAL_TABLET | Freq: Every day | ORAL | Status: DC
  Administered 2019-06-20: 20 mg via ORAL
  Filled 2019-06-20: qty 1

## 2019-06-20 MED ORDER — LISINOPRIL 20 MG PO TABS: 20.0000 mg | ORAL_TABLET | Freq: Every day | ORAL | 0 refills | Status: AC

## 2019-06-20 MED ORDER — LEVOTHYROXINE SODIUM 25 MCG PO TABS: 50.0000 ug | ORAL_TABLET | Freq: Every day | ORAL | 0 refills | Status: AC

## 2019-06-20 MED ORDER — AMLODIPINE BESYLATE 10 MG PO TABS: 10.0000 mg | ORAL_TABLET | Freq: Every day | ORAL | 0 refills | Status: DC

## 2019-06-20 NOTE — TOC Transition Note (Signed)
Transition of Care [facility]) - CM/SW Discharge Note   Patient Details  Name: Joy [name] MRN: [id number] Date of Birth: May 16, 1930  [facility] CM/SW Contact:  Eileen Stanford, LCSW Phone Number: 06/20/2019, 1:46 PM   Clinical Narrative: Clinical Social Worker facilitated patient discharge including contacting patient family and facility to confirm patient discharge plans.  Clinical information faxed to facility and family agreeable with plan.  CSW arranged ambulance transport via [facility] to [facility] .  RN to call [phone] for report prior to discharge.  Final next level of care: Skilled Nursing Facility Barriers to Discharge: No Barriers Identified   Patient Goals and CMS Choice        Discharge Placement              Patient [facility] bed at: [facility] Patient to be transferred to facility by: [facility] Name of family member notified: [name] Patient and family notified of of transfer: 06/20/19  Discharge Plan and Services In-house Referral: Clinical Social Work   Post Acute Care Choice: [place]                               Social Determinants of Health (SDOH) Interventions     Readmission Risk Interventions No flowsheet data found.

## 2019-06-20 NOTE — Discharge Instructions (Signed)
1.  Follow-up with PCP and cardiology in 1 week.

## 2019-06-20 NOTE — Discharge Summary (Signed)
Physician Discharge Summary  Patient ID: Joy [name] MRN: [id number] DOB/AGE: 06/22/1930 86 y.o.  Admit date: 06/18/2019 Discharge date: 06/20/2019  Admission Diagnoses:  Discharge Diagnoses:  Active Problems:   End stage COPD [facility])   Hypothyroidism   Hypertensive emergency   Chest pain   Hypertensive urgency   Neck pain on left side  Dementia.  Discharged Condition: good  Hospital Course:  Joy [name]  is a 86 y.o. female presenting to the hospital with chest pain.  The chest pain started this morning constant left side of the chest described as a constant 8 out of 10 intensity.  Nothing making it better or worse.  She also had a little shortness of breath with shallow breathing.  This afternoon the pain radiated up into her neck and still has soreness in her neck.  Her first 2 troponins were negative prior to the initiation of the neck pain.  Repeat EKG after the neck pain did not show any acute changes.  Blood pressure in the ER has been very elevated despite giving oral medications.  She had a CT angiogram of the neck, showed 50% of stenosis in right internal carotid artery.  Her short of breath and chest pain was thought to be secondary to hypertension emergency.  She is seen by cardiology, blood pressure medication was adjusted, blood pressure is gradually getting better.  At this point, she is medically stable to be discharged.  #1.  Hypertensive emergency.  POA. Patient required IV labetalol and increase oral blood pressure medication.  She is seen by cardiology, no additional work-up is indicated.  I spoke with the patient daughter, this is the first time patient ever had this high blood pressure.  If this happens again, may consider work-up for pheochromocytoma.  #2.  Chest pain and neck pain. Condition has improved.  No additional chest pain or neck pain.  CT angiogram of the neck did not show any chronic dissection.  Echocardiogram showed mild to moderate aortic  stenosis.  She will follow up with cardiology as outpatient.  3.  Hypothyroidism. TSH still elevated, increase Synthroid to 50 mg daily.  4.  End-stage COPD with chronic hypoxemic respiratory failure. Continue oxygen treatment.  Consults: cardiology  Significant Diagnostic Studies:   Echo: 1. Left ventricular ejection fraction, by estimation, is 65 to 70%. The left ventricle has normal function. The left ventricle has no regional wall motion abnormalities. There is mild asymmetric left ventricular hypertrophy of the basal-septal segment. Left ventricular diastolic parameters are consistent with Grade II diastolic dysfunction (pseudonormalization). Elevated left atrial pressure. 2. Right ventricular systolic function is normal. The right ventricular size is normal. Mildly increased right ventricular wall thickness. Tricuspid regurgitation signal is inadequate for assessing PA pressure. 3. The mitral valve is degenerative. Mild mitral valve regurgitation. No evidence of mitral stenosis. 4. The aortic valve has an indeterminant number of cusps. Aortic valve regurgitation is not visualized. Mild to moderate aortic valve stenosis. Aortic valve area, by VTI measures 1.03 cm. Aortic valve mean gradient measures 17.0 mmHg. 5. The inferior vena cava is normal in size with greater than 50% respiratory variability, suggesting right atrial pressure of 3 mmHg.  CT ANGIOGRAPHY NECK  TECHNIQUE: Multidetector CT imaging of the neck was performed using the standard protocol during bolus administration of intravenous contrast. Multiplanar CT image reconstructions and MIPs were obtained to evaluate the vascular anatomy. Carotid stenosis measurements (when applicable) are obtained utilizing NASCET criteria, using the distal internal carotid diameter as the denominator.  CONTRAST:  73mL OMNIPAQUE IOHEXOL 350 MG/ML SOLN  COMPARISON:  None.  FINDINGS: Aortic arch: Atherosclerotic  calcification throughout the aortic arch and proximal great vessels. Mild stenosis proximal left common carotid artery and left subclavian artery. Moderate to severe stenosis left subclavian artery distal to the vertebral.  Right carotid system: Mild atherosclerotic calcification right carotid bifurcation. Calcified plaque right carotid bulb with 50% diameter stenosis of the proximal right internal carotid artery. Moderate stenosis proximal right external carotid artery.  Left carotid system: Mild stenosis origin of left common carotid artery. Prior left carotid endarterectomy with surgical clips adjacent to the bifurcation. No recurrent stenosis or plaque.  Vertebral arteries: Both vertebral arteries widely patent to the basilar without significant stenosis.  Skeleton: Disc and facet degeneration in the cervical spine. No acute skeletal lesion.  Other neck: Negative for mass or adenopathy.  Limited intracranial imaging in the posterior fossa negative.  Upper chest: Severe emphysema without acute abnormality in the lung apices. Atherosclerotic aortic arch.  IMPRESSION: 1. 50% diameter stenosis proximal right internal carotid artery due to calcific plaque. Moderate stenosis proximal right external carotid artery 2. Postop left carotid endarterectomy without recurrent stenosis or plaque. 3. Both vertebral arteries widely patent 4. Moderate to severe stenosis left subclavian distal to the vertebral. 5. Aortic Atherosclerosis (ICD10-I70.0) and Emphysema (ICD10-J43.9).   Electronically Signed   By: Franchot Gallo M.D.   On: 06/18/2019 17:35  Treatments: Blood pressure medicine adjustment.  Discharge Exam: Blood pressure (!) 154/49, pulse (!) 57, temperature 97.7 F (36.5 C), temperature source Oral, resp. rate 19, height 5\' 4"  (1.626 m), weight 76.9 kg, SpO2 100 %. General appearance: alert and cooperative Resp: Significantly decreased breathing sounds without  crackles or wheezes Cardio: regular rate and rhythm, S1, S2 normal, no murmur, click, rub or gallop GI: soft, non-tender; bowel sounds normal; no masses,  no organomegaly Extremities: extremities normal, atraumatic, no cyanosis or edema   Patient is oriented to herself.   Disposition: Discharge disposition: 03-Skilled Nursing Facility       Discharge Instructions    Diet - low sodium heart healthy   Complete by: As directed    Increase activity slowly   Complete by: As directed      Allergies as of 06/20/2019      Reactions   Prednisone Other (See Comments)   Weight gain,puffy face   Shellfish Allergy Other (See Comments)   Unknown      Medication List    TAKE these medications   acetaminophen 650 MG CR tablet Commonly known as: TYLENOL Take 650 mg by mouth every 8 (eight) hours as needed for pain.   Advair Diskus 250-50 MCG/DOSE Aepb Generic drug: Fluticasone-Salmeterol INHALE 1 PUFF BY MOUTH TWICE A DAY What changed: See the new instructions.   albuterol (2.5 MG/3ML) 0.083% nebulizer solution Commonly known as: PROVENTIL Take 2.5 mg by nebulization every 6 (six) hours as needed for wheezing or shortness of breath.   amLODipine 10 MG tablet Commonly known as: NORVASC Take 1 tablet (10 mg total) by mouth daily. Start taking on: June 21, 2019   atorvastatin 20 MG tablet Commonly known as: LIPITOR Take 20 mg by mouth at bedtime.   barrier cream Crea Commonly known as: non-specified Apply 1 application topically as needed (rash). (apply to perineal area)   bisacodyl 10 MG suppository Commonly known as: DULCOLAX Place 10 mg rectally daily as needed for moderate constipation.   bismuth subsalicylate 381 WE/99BZ suspension Commonly known as: PEPTO BISMOL Take 30 mLs by  mouth daily as needed for diarrhea or loose stools.   clopidogrel 75 MG tablet Commonly known as: PLAVIX Take 1 tablet (75 mg total) by mouth daily.   ipratropium-albuterol 0.5-2.5 (3)  MG/3ML Soln Commonly known as: DUONEB Take 3 mLs by nebulization every 4 (four) hours as needed (wheezing or dyspnea).   levothyroxine 25 MCG tablet Commonly known as: SYNTHROID Take 2 tablets (50 mcg total) by mouth at bedtime. What changed: how much to take   lisinopril 20 MG tablet Commonly known as: ZESTRIL Take 1 tablet (20 mg total) by mouth daily. Start taking on: June 21, 2019 What changed:   medication strength  how much to take   memantine 10 MG tablet Commonly known as: NAMENDA Take 10 mg by mouth 2 (two) times daily.   nitroGLYCERIN 0.4 MG SL tablet Commonly known as: NITROSTAT Place 0.4 mg under the tongue every 5 (five) minutes as needed for chest pain.   ondansetron 4 MG disintegrating tablet Commonly known as: Zofran ODT Allow 1-2 tablets to dissolve in your mouth every 8 hours as needed for nausea/vomiting   pantoprazole 20 MG tablet Commonly known as: PROTONIX Take 20 mg by mouth daily.   polyethylene glycol 17 g packet Commonly known as: MIRALAX / GLYCOLAX Take 17 g by mouth daily.   sertraline 50 MG tablet Commonly known as: ZOLOFT Take 50 mg by mouth at bedtime.   Spiriva HandiHaler 18 MCG inhalation capsule Generic drug: tiotropium PLACE 1 CAPSULE (18 MCG TOTAL) INTO INHALER AND INHALE DAILY. What changed: See the new instructions.   triamcinolone cream 0.1 % Commonly known as: KENALOG Apply 1 application topically 2 (two) times daily as needed (rash).   Tussin DM Max 5-100 MG/5ML Liqd Generic drug: Dextromethorphan-guaiFENesin Take 10 mLs by mouth every 4 (four) hours as needed (cough).   vitamin B-12 1000 MCG tablet Commonly known as: CYANOCOBALAMIN Take 1,000 mcg by mouth daily.       Follow-up Information    Lucille Passy, MD Follow up in 1 week(s).   Specialty: Family Medicine Contact information: [place] [address] [phone]        Wellington Hampshire, MD Follow up in 1 week(s).    Specialty: Cardiology Contact information: [place] [address] [phone]               Signed: Sharen Hones 06/20/2019, 12:17 PM

## 2019-06-20 NOTE — Progress Notes (Addendum)
Progress Note  Patient Name: Joy [name] Date of Encounter: 06/20/2019  [facility] Cardiologist: Kathlyn Sacramento, MD   Subjective   Patient lying comfortably in bed.  No acute events overnight.  Denies any chest pain or shortness of breath.  Blood pressures appear better.  Inpatient Medications    Scheduled Meds: . amLODipine  10 mg Oral Daily  . atorvastatin  20 mg Oral QHS  . clopidogrel  75 mg Oral Daily  . enoxaparin (LOVENOX) injection  40 mg Subcutaneous Q24H  . levothyroxine  25 mcg Oral QHS  . lisinopril  20 mg Oral Daily  . memantine  10 mg Oral BID  . mometasone-formoterol  2 puff Inhalation BID  . pantoprazole  20 mg Oral Daily  . polyethylene glycol  17 g Oral Daily  . sertraline  50 mg Oral QHS  . sodium chloride flush  10 mL Intravenous Q12H  . tiotropium  18 mcg Inhalation Daily  . vitamin B-12  1,000 mcg Oral Daily   Continuous Infusions:  PRN Meds: acetaminophen **OR** acetaminophen, albuterol, labetalol, liver oil-zinc oxide, nitroGLYCERIN, ondansetron **OR** ondansetron (ZOFRAN) IV   Vital Signs    Vitals:   06/19/19 2000 06/20/19 0515 06/20/19 0603 06/20/19 0808  BP: (!) 147/53 (!) 139/39  (!) 152/44  Pulse: (!) 59 (!) 54  (!) 53  Resp:  20  18  Temp: 98.3 F (36.8 C) 98.4 F (36.9 C)  98.1 F (36.7 C)  TempSrc: Oral Oral    SpO2: 97% 99%  98%  Weight:  78 kg 76.9 kg   Height:        Intake/Output Summary (Last 24 hours) at 06/20/2019 1018 Last data filed at 06/20/2019 0520 Gross per 24 hour  Intake 365.24 ml  Output 400 ml  Net -34.76 ml   Last 3 Weights 06/20/2019 06/20/2019 06/19/2019  Weight (lbs) 169 lb 9.6 oz 172 lb 170 lb  Weight (kg) 76.93 kg 78.019 kg 77.111 kg      Telemetry    Sinus bradycardia, heart rate 51- Personally Reviewed  ECG    No new ECG obtained- Personally Reviewed  Physical Exam   GEN: No acute distress.   Neck: No JVD Cardiac:  Regular, bradycardic, systolic murmur Respiratory:  Clear  anteriorly, poor inspiratory effort GI: Soft, nontender, non-distended  MS: No edema; No deformity. Neuro:  Nonfocal  Psych: Normal affect   Labs    High Sensitivity Troponin:   Recent Labs  Lab 06/18/19 1017 06/18/19 1320 06/18/19 1536 06/19/19 0446  TROPONINIHS 9 11 12 16       Chemistry Recent Labs  Lab 06/18/19 1017 06/19/19 0446 06/20/19 0440  NA 142 146* 145  K 4.6 4.4 4.1  CL 106 112* 110  CO2 28 27 27   GLUCOSE 98 99 86  BUN 24* 21 20  CREATININE 1.20* 1.13* 1.16*  CALCIUM 8.9 8.4* 8.5*  GFRNONAA 41* 44* 43*  GFRAA 47* 51* 49*  ANIONGAP 8 7 8      Hematology Recent Labs  Lab 06/18/19 1017 06/19/19 0446  WBC 5.2 4.7  RBC 3.35* 2.88*  HGB 10.7* 9.1*  HCT 31.4* 27.2*  MCV 93.7 94.4  MCH 31.9 31.6  MCHC 34.1 33.5  RDW 15.3 15.4  PLT 150 135*    BNPNo results for input(s): BNP, PROBNP in the last 168 hours.   DDimer No results for input(s): DDIMER in the last 168 hours.   Radiology    DG Chest 2 View  Result Date: 06/18/2019  CLINICAL DATA:  Chest pain EXAM: CHEST - 2 VIEW COMPARISON:  May 10, 2017 FINDINGS: There is slight scarring right base. Lungs elsewhere are clear. Heart is upper normal in size with pulmonary vascularity normal. No adenopathy. There is aortic atherosclerosis. There is degenerative change in the thoracic spine. There is an old healed fracture of the proximal right humerus. IMPRESSION: Scarring right base. No edema or airspace opacity. Heart upper normal in size. Aortic Atherosclerosis (ICD10-I70.0). Prior fracture proximal right humerus with remodeling. Electronically Signed   By: Lowella Grip III M.D.   On: 06/18/2019 10:51   CT ANGIO NECK W OR WO CONTRAST  Result Date: 06/18/2019 CLINICAL DATA:  Neck pain. Left chest pain. History of left carotid endarterectomy 2005 EXAM: CT ANGIOGRAPHY NECK TECHNIQUE: Multidetector CT imaging of the neck was performed using the standard protocol during bolus administration of intravenous  contrast. Multiplanar CT image reconstructions and MIPs were obtained to evaluate the vascular anatomy. Carotid stenosis measurements (when applicable) are obtained utilizing NASCET criteria, using the distal internal carotid diameter as the denominator. CONTRAST:  38mL OMNIPAQUE IOHEXOL 350 MG/ML SOLN COMPARISON:  None. FINDINGS: Aortic arch: Atherosclerotic calcification throughout the aortic arch and proximal great vessels. Mild stenosis proximal left common carotid artery and left subclavian artery. Moderate to severe stenosis left subclavian artery distal to the vertebral. Right carotid system: Mild atherosclerotic calcification right carotid bifurcation. Calcified plaque right carotid bulb with 50% diameter stenosis of the proximal right internal carotid artery. Moderate stenosis proximal right external carotid artery. Left carotid system: Mild stenosis origin of left common carotid artery. Prior left carotid endarterectomy with surgical clips adjacent to the bifurcation. No recurrent stenosis or plaque. Vertebral arteries: Both vertebral arteries widely patent to the basilar without significant stenosis. Skeleton: Disc and facet degeneration in the cervical spine. No acute skeletal lesion. Other neck: Negative for mass or adenopathy. Limited intracranial imaging in the posterior fossa negative. Upper chest: Severe emphysema without acute abnormality in the lung apices. Atherosclerotic aortic arch. IMPRESSION: 1. 50% diameter stenosis proximal right internal carotid artery due to calcific plaque. Moderate stenosis proximal right external carotid artery 2. Postop left carotid endarterectomy without recurrent stenosis or plaque. 3. Both vertebral arteries widely patent 4. Moderate to severe stenosis left subclavian distal to the vertebral. 5. Aortic Atherosclerosis (ICD10-I70.0) and Emphysema (ICD10-J43.9). Electronically Signed   By: Franchot Gallo M.D.   On: 06/18/2019 17:35   ECHOCARDIOGRAM  COMPLETE  Result Date: 06/19/2019    ECHOCARDIOGRAM REPORT   Patient Name:   Joy [name] Date of Exam: 06/19/2019 Medical Rec #:  [id number]        Height:       64.0 in Accession #:    [id number]       Weight:       156.1 lb Date of Birth:  [birth year]        BSA:          1.761 m Patient Age:    71 years         BP:           158/46 mmHg Patient Gender: F                HR:           59 bpm. Exam Location:  [facility] Procedure: 2D Echo, Color Doppler and Cardiac Doppler Indications:     R07.9 Chest Pain  History:         Patient has no prior history of Echocardiogram  examinations.                  TIA and COPD; Risk Factors:Hypertension and Dyslipidemia.  Sonographer:     Charmayne Sheer RDCS (AE) Referring Phys:  [id number] Loletha Grayer Diagnosing Phys: Nelva Bush MD IMPRESSIONS  1. Left ventricular ejection fraction, by estimation, is 65 to 70%. The left ventricle has normal function. The left ventricle has no regional wall motion abnormalities. There is mild asymmetric left ventricular hypertrophy of the basal-septal segment. Left ventricular diastolic parameters are consistent with Grade II diastolic dysfunction (pseudonormalization). Elevated left atrial pressure.  2. Right ventricular systolic function is normal. The right ventricular size is normal. Mildly increased right ventricular wall thickness. Tricuspid regurgitation signal is inadequate for assessing PA pressure.  3. The mitral valve is degenerative. Mild mitral valve regurgitation. No evidence of mitral stenosis.  4. The aortic valve has an indeterminant number of cusps. Aortic valve regurgitation is not visualized. Mild to moderate aortic valve stenosis. Aortic valve area, by VTI measures 1.03 cm. Aortic valve mean gradient measures 17.0 mmHg.  5. The inferior vena cava is normal in size with greater than 50% respiratory variability, suggesting right atrial pressure of 3 mmHg. FINDINGS  Left Ventricle: Left ventricular ejection fraction, by  estimation, is 65 to 70%. The left ventricle has normal function. The left ventricle has no regional wall motion abnormalities. The left ventricular internal cavity size was normal in size. There is  mild asymmetric left ventricular hypertrophy of the basal-septal segment. Left ventricular diastolic parameters are consistent with Grade II diastolic dysfunction (pseudonormalization). Elevated left atrial pressure. Right Ventricle: The right ventricular size is normal. Mildly increased right ventricular wall thickness. Right ventricular systolic function is normal. Tricuspid regurgitation signal is inadequate for assessing PA pressure. Left Atrium: Left atrial size was normal in size. Right Atrium: Right atrial size was normal in size. Pericardium: There is no evidence of pericardial effusion. Mitral Valve: The mitral valve is degenerative in appearance. There is mild thickening of the posterior mitral valve leaflet(s). There is moderate calcification of the posterior mitral valve leaflet(s). Mild mitral annular calcification. Mild mitral valve regurgitation. No evidence of mitral valve stenosis. MV peak gradient, 8.4 mmHg. The mean mitral valve gradient is 3.0 mmHg. Tricuspid Valve: The tricuspid valve is not well visualized. Tricuspid valve regurgitation is trivial. Aortic Valve: The aortic valve has an indeterminant number of cusps. . There is mild thickening and mild calcification of the aortic valve. Aortic valve regurgitation is not visualized. Mild to moderate aortic stenosis is present. There is mild thickening of the aortic valve. There is mild calcification of the aortic valve. Aortic valve mean gradient measures 17.0 mmHg. Aortic valve peak gradient measures 32.3 mmHg. Aortic valve area, by VTI measures 1.03 cm. Pulmonic Valve: The pulmonic valve was not well visualized. Pulmonic valve regurgitation is not visualized. No evidence of pulmonic stenosis. Aorta: The aortic root is normal in size and structure.  Pulmonary Artery: The pulmonary artery is not well seen. Venous: The inferior vena cava is normal in size with greater than 50% respiratory variability, suggesting right atrial pressure of 3 mmHg. IAS/Shunts: No atrial level shunt detected by color flow Doppler.  LEFT VENTRICLE PLAX 2D LVIDd:         3.68 cm  Diastology LVIDs:         1.78 cm  LV e' lateral:   4.90 cm/s LV PW:         1.06 cm  LV E/e' lateral: 29.2  LV IVS:        0.87 cm  LV e' medial:    4.79 cm/s LVOT diam:     1.70 cm  LV E/e' medial:  29.9 LV SV:         68 LV SV Index:   39 LVOT Area:     2.27 cm  RIGHT VENTRICLE RV Basal diam:  3.35 cm LEFT ATRIUM             Index       RIGHT ATRIUM           Index LA diam:        4.30 cm 2.44 cm/m  RA Area:     16.10 cm LA Vol (A2C):   50.7 ml 28.79 ml/m RA Volume:   39.20 ml  22.26 ml/m LA Vol (A4C):   52.3 ml 29.70 ml/m LA Biplane Vol: 54.0 ml 30.67 ml/m  AORTIC VALVE                    PULMONIC VALVE AV Area (Vmax):    0.82 cm     PV Vmax:       1.32 m/s AV Area (Vmean):   0.97 cm     PV Vmean:      84.800 cm/s AV Area (VTI):     1.03 cm     PV VTI:        0.298 m AV Vmax:           284.00 cm/s  PV Peak grad:  7.0 mmHg AV Vmean:          180.000 cm/s PV Mean grad:  3.0 mmHg AV VTI:            0.659 m AV Peak Grad:      32.3 mmHg AV Mean Grad:      17.0 mmHg LVOT Vmax:         103.00 cm/s LVOT Vmean:        76.900 cm/s LVOT VTI:          0.300 m LVOT/AV VTI ratio: 0.46  AORTA Ao Root diam: 2.70 cm MITRAL VALVE MV Area (PHT): 3.17 cm     SHUNTS MV Peak grad:  8.4 mmHg     Systemic VTI:  0.30 m MV Mean grad:  3.0 mmHg     Systemic Diam: 1.70 cm MV Vmax:       1.45 m/s MV Vmean:      77.5 cm/s MV Decel Time: 239 msec MV E velocity: 143.00 cm/s MV A velocity: 122.00 cm/s MV E/A ratio:  1.17 Nelva Bush MD Electronically signed by Nelva Bush MD Signature Date/Time: 06/19/2019/1:11:06 PM    Final     Cardiac Studies   Echo 06/19/2019 1. Left ventricular ejection fraction, by estimation,  is 65 to 70%. The  left ventricle has normal function. The left ventricle has no regional  wall motion abnormalities. There is mild asymmetric left ventricular  hypertrophy of the basal-septal segment.  Left ventricular diastolic parameters are consistent with Grade II  diastolic dysfunction (pseudonormalization). Elevated left atrial  pressure.  2. Right ventricular systolic function is normal. The right ventricular  size is normal. Mildly increased right ventricular wall thickness.  Tricuspid regurgitation signal is inadequate for assessing PA pressure.  3. The mitral valve is degenerative. Mild mitral valve regurgitation. No  evidence of mitral stenosis.  4. The aortic valve has an indeterminant number of cusps. Aortic valve  regurgitation  is not visualized. Mild to moderate aortic valve stenosis.  Aortic valve area, by VTI measures 1.03 cm. Aortic valve mean gradient  measures 17.0 mmHg.  5. The inferior vena cava is normal in size with greater than 50%  respiratory variability, suggesting right atrial pressure of 3 mmHg.   Patient Profile     86 y.o. female with history of CAD status post PCI to LAD and left circumflex, CVA, dementia presenting with atypical chest discomfort, and elevated blood pressures.  Patient being seen due to elevated troponins and hypertensive urgency  Assessment & Plan    1.  Uncontrolled hypertension -Blood pressure improved. -Increase lisinopril to 20 mg daily, continue on Norvasc 10 mg daily -Stop hydralazine 3 times daily (to increase compliance and decrease polypharmacy in elderly patient) can further increase lisinopril if needed for adequate blood pressure control.  2.  History of CAD status post PCI -Plavix, Lipitor -Avoiding beta-blockers due to bradycardia.  3.  History of CVA -TIA Plavix, Lipitor.  4. Elevated trops -2/2 demand ischemia -Medical management for CAD as above    Signed, Kate Sable, MD  06/20/2019, 10:18 AM

## 2019-06-20 NOTE — Progress Notes (Signed)
Patient discharged back to [facility] via EMS.  Report called and given to [employer].

## 2019-06-20 NOTE — NC FL2 (Signed)
[place] LEVEL OF CARE SCREENING TOOL     IDENTIFICATION  Patient Name: Joy [name] Birthdate: January 26, 1930 Sex: female Admission Date (Current Location): 06/18/2019  [facility] and [place] Number:  Engineering geologist and Address:  [facility], [address] [place], [place] [address]      Provider Number: [id number]  Attending Physician Name and Address:  Sharen Hones, MD  Relative Name and Phone Number:       Current Level of Care: Hospital Recommended Level of Care: [place] Prior Approval Number:    Date Approved/Denied:   PASRR Number:    Discharge Plan: SNF    Current Diagnoses: Patient Active Problem List   Diagnosis Date Noted  . Hypertensive urgency 06/18/2019  . Neck pain on left side   . Alzheimer's dementia without behavioral disturbance ([place])   . Palliative care by specialist   . DNR (do not resuscitate)   . Goals of care, counseling/discussion   . Gabapentin-induced toxicity 04/04/2016  . Allergic rhinitis 11/04/2014  . Chronic diastolic heart failure ([place]) 07/22/2014  . Flash pulmonary edema ([place]) 06/30/2014  . Hypertensive emergency 06/30/2014  . CKD (chronic kidney disease) stage 3, GFR 30-59 ml/min 06/11/2014  . Carotid artery disease ([place]) 04/08/2014  . CAD (coronary artery disease)   . HLD (hyperlipidemia)   . Chest pain 01/25/2013  . Lung nodule 01/15/2013  . Weakness generalized 12/26/2012  . Incontinence 12/26/2012  . GERD (gastroesophageal reflux disease) 09/06/2012  . Neuropathy 01/14/2011  . Insomnia 01/14/2011  . MELAS (mitochondrial encephalopathy, lactic acidosis and stroke-like episodes) ([place]) 11/02/2010  . HTN (hypertension)   . End stage COPD ([place])   . Depression   . Hypothyroidism   . Cryoglobulinemia ([place]) 07/18/2010  . Essential tremor 07/18/2010  . Open-angle glaucoma 07/18/2010  . Other specified forms of chronic ischemic heart disease 07/18/2010  . Renal  cyst 07/18/2010  . Senile osteoporosis 07/18/2010  . Urge incontinence 07/18/2010    Orientation RESPIRATION BLADDER Height & Weight     Self, Place  O2 (Nasal Cannula 4L) Incontinent Weight: 169 lb 9.6 oz (76.9 kg) Height:  5\' 4"  (162.6 cm)  BEHAVIORAL SYMPTOMS/MOOD NEUROLOGICAL BOWEL NUTRITION STATUS      Incontinent Diet (heart healthy, thin liquids)  AMBULATORY STATUS COMMUNICATION OF NEEDS Skin   Limited Assist Verbally Normal                       Personal Care Assistance Level of Assistance  Bathing, Dressing, Feeding Bathing Assistance: Limited assistance Feeding assistance: Independent Dressing Assistance: Limited assistance     Functional Limitations Info  Sight, Speech, Hearing Sight Info: Adequate Hearing Info: Adequate Speech Info: Adequate    SPECIAL CARE FACTORS FREQUENCY  PT (By licensed PT), OT (By licensed OT)                    Contractures Contractures Info: Not present    Additional Factors Info  Code Status, Allergies Code Status Info: Full Code Allergies Info: Prednisone, Shellfish Allergy           Current Medications (06/20/2019):  This is the current hospital active medication list Current Facility-Administered Medications  Medication Dose Route Frequency Provider Last Rate Last Admin  . acetaminophen (TYLENOL) tablet 650 mg  650 mg Oral Q6H PRN Wieting, Richard, MD       Or  . acetaminophen (TYLENOL) suppository 650 mg  650 mg Rectal Q6H PRN Loletha Grayer, MD      .  albuterol (PROVENTIL) (2.5 MG/3ML) 0.083% nebulizer solution 2.5 mg  2.5 mg Nebulization Q6H PRN Wieting, Richard, MD      . amLODipine (NORVASC) tablet 10 mg  10 mg Oral Daily Loletha Grayer, MD   10 mg at 06/20/19 0924  . atorvastatin (LIPITOR) tablet 20 mg  20 mg Oral QHS Loletha Grayer, MD   20 mg at 06/19/19 2205  . clopidogrel (PLAVIX) tablet 75 mg  75 mg Oral Daily Loletha Grayer, MD   75 mg at 06/20/19 0924  . enoxaparin (LOVENOX) injection 40 mg   40 mg Subcutaneous Q24H Loletha Grayer, MD   40 mg at 06/19/19 2205  . labetalol (NORMODYNE) injection 10 mg  10 mg Intravenous Q2H PRN Loletha Grayer, MD      . levothyroxine (SYNTHROID) tablet 25 mcg  25 mcg Oral QHS Loletha Grayer, MD   25 mcg at 06/19/19 2205  . lisinopril (ZESTRIL) tablet 20 mg  20 mg Oral Daily Kate Sable, MD   20 mg at 06/20/19 0923  . liver oil-zinc oxide (DESITIN) 40 % ointment 1 application  1 application Topical PRN Wieting, Richard, MD      . memantine [facility] [place]) tablet 10 mg  10 mg Oral BID Loletha Grayer, MD   10 mg at 06/20/19 0924  . mometasone-formoterol (DULERA) 200-5 MCG/ACT inhaler 2 puff  2 puff Inhalation BID Loletha Grayer, MD   2 puff at 06/20/19 0926  . nitroGLYCERIN (NITROSTAT) SL tablet 0.4 mg  0.4 mg Sublingual Q5 min PRN Loletha Grayer, MD      . ondansetron [facility]) tablet 4 mg  4 mg Oral Q6H PRN Wieting, Richard, MD       Or  . ondansetron (ZOFRAN) injection 4 mg  4 mg Intravenous Q6H PRN Wieting, Richard, MD      . pantoprazole (PROTONIX) EC tablet 20 mg  20 mg Oral Daily Loletha Grayer, MD   20 mg at 06/20/19 6811  . polyethylene glycol (MIRALAX / GLYCOLAX) packet 17 g  17 g Oral Daily Loletha Grayer, MD   17 g at 06/19/19 0814  . sertraline (ZOLOFT) tablet 50 mg  50 mg Oral QHS Loletha Grayer, MD   50 mg at 06/19/19 2205  . sodium chloride flush (NS) 0.9 % injection 10 mL  10 mL Intravenous Q12H Loletha Grayer, MD   10 mL at 06/19/19 2209  . tiotropium (SPIRIVA) inhalation capsule ([facility] use ONLY) 18 mcg  18 mcg Inhalation Daily Wieting, Richard, MD      . vitamin B-12 (CYANOCOBALAMIN) tablet 1,000 mcg  1,000 mcg Oral Daily Loletha Grayer, MD   1,000 mcg at 06/20/19 5726     Discharge Medications: Please see discharge summary for a list of discharge medications.  Relevant Imaging Results:  Relevant Lab Results:   Additional Information SSN [id number]  Eileen Stanford, LCSW

## 2019-06-20 NOTE — TOC Initial Note (Signed)
Transition of Care [facility]) - Initial/Assessment Note    Patient Details  Name: Joy [name] MRN: [id number] Date of Birth: November 04, 1930  Transition of Care [facility]) CM/SW Contact:    Eileen Stanford, LCSW Phone Number: 06/20/2019, 1:45 PM  Clinical Narrative:   Pt is only alert to self and place. CSW spoke with pt's daughter--confirmed pt is from [facility]. Plan is for pt to return today.                Expected Discharge Plan: Memory Care Barriers to Discharge: No Barriers Identified   Patient Goals and CMS Choice        Expected Discharge Plan and Services Expected Discharge Plan: Memory Care In-house Referral: Clinical Social Work   Post Acute Care Choice: [place] Living arrangements for the past 2 months: [place] Expected Discharge Date: 06/20/19                                    Prior Living Arrangements/Services Living arrangements for the past 2 months: [place] Lives with:: Self Patient language and need for interpreter reviewed:: Yes Do you feel safe going back to the place where you live?: Yes      Need for Family Participation in Patient Care: Yes (Comment) Care giver support system in place?: Yes (comment)   Criminal Activity/Legal Involvement Pertinent to Current Situation/Hospitalization: No - Comment as needed  Activities of Daily Living Home Assistive Devices/Equipment: Oxygen, Cane (specify quad or straight) ADL Screening (condition at time of admission) Patient's cognitive ability adequate to safely complete daily activities?: Yes Is the patient deaf or have difficulty hearing?: No Does the patient have difficulty seeing, even when wearing glasses/contacts?: No Does the patient have difficulty concentrating, remembering, or making decisions?: Yes Patient able to express need for assistance with ADLs?: Yes Does the patient have difficulty dressing or bathing?: No Independently  performs ADLs?: No Communication: Needs assistance Is this a change from baseline?: Pre-admission baseline Dressing (OT): Independent Grooming: Needs assistance Is this a change from baseline?: Pre-admission baseline Feeding: Independent Is this a change from baseline?: Pre-admission baseline Bathing: Needs assistance Is this a change from baseline?: Pre-admission baseline Toileting: Needs assistance Is this a change from baseline?: Pre-admission baseline In/Out Bed: Independent Walks in Home: Independent with device (comment) Does the patient have difficulty walking or climbing stairs?: Yes Weakness of Legs: Both Weakness of Arms/Hands: None  Permission Sought/Granted Permission sought to share information with : Family Supports    Share Information with NAME: abby  Permission granted to share info w AGENCY: [facility]  Permission granted to share info w Relationship: daughter     Emotional Assessment Appearance:: Appears stated age Attitude/Demeanor/Rapport: Unable to Assess Affect (typically observed): Unable to Assess Orientation: : Oriented to Place, Oriented to Self Alcohol / Substance Use: Not Applicable Psych Involvement: No (comment)  Admission diagnosis:  Hypertensive urgency [I16.0] Essential hypertension [I10] Chest pain, unspecified type [R07.9] Patient Active Problem List   Diagnosis Date Noted  . Hypertensive urgency 06/18/2019  . Neck pain on left side   . Alzheimer's dementia without behavioral disturbance ([place])   . Palliative care by specialist   . DNR (do not resuscitate)   . Goals of care, counseling/discussion   . Gabapentin-induced toxicity 04/04/2016  . Allergic rhinitis 11/04/2014  . Chronic diastolic heart failure ([place]) 07/22/2014  . Flash pulmonary edema ([place]) 06/30/2014  .  Hypertensive emergency 06/30/2014  . CKD (chronic kidney disease) stage 3, GFR 30-59 ml/min 06/11/2014  . Carotid artery disease ([place]) 04/08/2014  . CAD (coronary  artery disease)   . HLD (hyperlipidemia)   . Chest pain 01/25/2013  . Lung nodule 01/15/2013  . Weakness generalized 12/26/2012  . Incontinence 12/26/2012  . GERD (gastroesophageal reflux disease) 09/06/2012  . Neuropathy 01/14/2011  . Insomnia 01/14/2011  . MELAS (mitochondrial encephalopathy, lactic acidosis and stroke-like episodes) ([place]) 11/02/2010  . HTN (hypertension)   . End stage COPD ([place])   . Depression   . Hypothyroidism   . Cryoglobulinemia ([place]) 07/18/2010  . Essential tremor 07/18/2010  . Open-angle glaucoma 07/18/2010  . Other specified forms of chronic ischemic heart disease 07/18/2010  . Renal cyst 07/18/2010  . Senile osteoporosis 07/18/2010  . Urge incontinence 07/18/2010   PCP:  Lucille Passy, MD Pharmacy:   [place], [place] - [place] [address] [place] [address] Phone: [phone] Fax: [place], [place] [address] [place] [address] Phone: [phone] Fax: [phone]     Social Determinants of Health (SDOH) Interventions    Readmission Risk Interventions No flowsheet data found.

## 2019-06-21 DIAGNOSIS — I10 Essential (primary) hypertension: Secondary | ICD-10-CM | POA: Diagnosis not present

## 2019-06-21 DIAGNOSIS — I6529 Occlusion and stenosis of unspecified carotid artery: Secondary | ICD-10-CM | POA: Diagnosis not present

## 2019-06-21 DIAGNOSIS — E039 Hypothyroidism, unspecified: Secondary | ICD-10-CM | POA: Diagnosis not present

## 2019-06-21 DIAGNOSIS — J441 Chronic obstructive pulmonary disease with (acute) exacerbation: Secondary | ICD-10-CM | POA: Diagnosis not present

## 2019-06-29 DIAGNOSIS — B372 Candidiasis of skin and nail: Secondary | ICD-10-CM | POA: Diagnosis not present

## 2019-07-18 DIAGNOSIS — K5900 Constipation, unspecified: Secondary | ICD-10-CM | POA: Diagnosis not present

## 2019-07-18 DIAGNOSIS — J449 Chronic obstructive pulmonary disease, unspecified: Secondary | ICD-10-CM | POA: Diagnosis not present

## 2019-07-18 DIAGNOSIS — I10 Essential (primary) hypertension: Secondary | ICD-10-CM | POA: Diagnosis not present

## 2019-07-18 DIAGNOSIS — N183 Chronic kidney disease, stage 3 unspecified: Secondary | ICD-10-CM | POA: Diagnosis not present

## 2019-07-18 DIAGNOSIS — F39 Unspecified mood [affective] disorder: Secondary | ICD-10-CM | POA: Diagnosis not present

## 2019-07-18 DIAGNOSIS — I6529 Occlusion and stenosis of unspecified carotid artery: Secondary | ICD-10-CM | POA: Diagnosis not present

## 2019-07-18 DIAGNOSIS — J9611 Chronic respiratory failure with hypoxia: Secondary | ICD-10-CM | POA: Diagnosis not present

## 2019-07-18 DIAGNOSIS — G309 Alzheimer's disease, unspecified: Secondary | ICD-10-CM | POA: Diagnosis not present

## 2019-07-18 DIAGNOSIS — K219 Gastro-esophageal reflux disease without esophagitis: Secondary | ICD-10-CM | POA: Diagnosis not present

## 2019-07-19 DIAGNOSIS — E039 Hypothyroidism, unspecified: Secondary | ICD-10-CM | POA: Diagnosis not present

## 2019-10-02 DIAGNOSIS — G301 Alzheimer's disease with late onset: Secondary | ICD-10-CM | POA: Diagnosis not present

## 2019-10-02 DIAGNOSIS — I25110 Atherosclerotic heart disease of native coronary artery with unstable angina pectoris: Secondary | ICD-10-CM | POA: Diagnosis not present

## 2019-10-02 DIAGNOSIS — J439 Emphysema, unspecified: Secondary | ICD-10-CM | POA: Diagnosis not present

## 2019-10-02 DIAGNOSIS — F39 Unspecified mood [affective] disorder: Secondary | ICD-10-CM | POA: Diagnosis not present

## 2019-10-02 DIAGNOSIS — J9611 Chronic respiratory failure with hypoxia: Secondary | ICD-10-CM | POA: Diagnosis not present

## 2019-10-29 DIAGNOSIS — R1084 Generalized abdominal pain: Secondary | ICD-10-CM | POA: Diagnosis not present

## 2019-10-29 DIAGNOSIS — D649 Anemia, unspecified: Secondary | ICD-10-CM | POA: Diagnosis not present

## 2019-10-29 DIAGNOSIS — R110 Nausea: Secondary | ICD-10-CM | POA: Diagnosis not present

## 2019-10-29 DIAGNOSIS — R1011 Right upper quadrant pain: Secondary | ICD-10-CM | POA: Diagnosis not present

## 2019-10-29 DIAGNOSIS — R109 Unspecified abdominal pain: Secondary | ICD-10-CM | POA: Diagnosis not present

## 2019-11-01 DIAGNOSIS — R110 Nausea: Secondary | ICD-10-CM | POA: Diagnosis not present

## 2019-11-01 DIAGNOSIS — R630 Anorexia: Secondary | ICD-10-CM | POA: Diagnosis not present

## 2019-11-05 ENCOUNTER — Other Ambulatory Visit: Payer: Self-pay | Admitting: Internal Medicine

## 2019-11-05 DIAGNOSIS — R109 Unspecified abdominal pain: Secondary | ICD-10-CM

## 2019-11-05 DIAGNOSIS — R634 Abnormal weight loss: Secondary | ICD-10-CM

## 2019-11-07 DIAGNOSIS — D649 Anemia, unspecified: Secondary | ICD-10-CM | POA: Diagnosis not present

## 2019-11-07 DIAGNOSIS — I2510 Atherosclerotic heart disease of native coronary artery without angina pectoris: Secondary | ICD-10-CM | POA: Diagnosis not present

## 2019-11-07 DIAGNOSIS — E039 Hypothyroidism, unspecified: Secondary | ICD-10-CM | POA: Diagnosis not present

## 2019-11-07 DIAGNOSIS — F39 Unspecified mood [affective] disorder: Secondary | ICD-10-CM | POA: Diagnosis not present

## 2019-11-07 DIAGNOSIS — G309 Alzheimer's disease, unspecified: Secondary | ICD-10-CM | POA: Diagnosis not present

## 2019-11-07 DIAGNOSIS — K219 Gastro-esophageal reflux disease without esophagitis: Secondary | ICD-10-CM | POA: Diagnosis not present

## 2019-11-07 DIAGNOSIS — J449 Chronic obstructive pulmonary disease, unspecified: Secondary | ICD-10-CM | POA: Diagnosis not present

## 2019-11-07 DIAGNOSIS — I6529 Occlusion and stenosis of unspecified carotid artery: Secondary | ICD-10-CM | POA: Diagnosis not present

## 2019-11-07 DIAGNOSIS — J9611 Chronic respiratory failure with hypoxia: Secondary | ICD-10-CM | POA: Diagnosis not present

## 2019-11-07 DIAGNOSIS — I10 Essential (primary) hypertension: Secondary | ICD-10-CM | POA: Diagnosis not present

## 2019-11-14 DIAGNOSIS — Z23 Encounter for immunization: Secondary | ICD-10-CM | POA: Diagnosis not present

## 2019-11-20 ENCOUNTER — Other Ambulatory Visit: Payer: Self-pay

## 2019-11-20 ENCOUNTER — Other Ambulatory Visit: Payer: Self-pay | Admitting: Internal Medicine

## 2019-11-20 ENCOUNTER — Ambulatory Visit
Admission: RE | Admit: 2019-11-20 | Discharge: 2019-11-20 | Disposition: A | Discharge status: Home / Self Care | Payer: Medicare Other | Source: Ambulatory Visit | Attending: Internal Medicine | Admitting: Internal Medicine

## 2019-11-20 DIAGNOSIS — I700 Atherosclerosis of aorta: Secondary | ICD-10-CM | POA: Diagnosis not present

## 2019-11-20 DIAGNOSIS — R634 Abnormal weight loss: Secondary | ICD-10-CM | POA: Insufficient documentation

## 2019-11-20 DIAGNOSIS — R110 Nausea: Secondary | ICD-10-CM | POA: Diagnosis not present

## 2019-11-20 DIAGNOSIS — R109 Unspecified abdominal pain: Secondary | ICD-10-CM

## 2019-11-20 DIAGNOSIS — N281 Cyst of kidney, acquired: Secondary | ICD-10-CM | POA: Diagnosis not present

## 2019-11-20 LAB — POCT I-STAT CREATININE: Creatinine, Ser: 1.8 mg/dL — ABNORMAL HIGH (ref 0.44–1.00)

## 2019-11-20 MED ORDER — IOHEXOL 300 MG/ML  SOLN: 100.0000 mL | Freq: Once | INTRAMUSCULAR | Status: DC | PRN

## 2019-11-22 DIAGNOSIS — N184 Chronic kidney disease, stage 4 (severe): Secondary | ICD-10-CM

## 2019-11-22 DIAGNOSIS — J449 Chronic obstructive pulmonary disease, unspecified: Secondary | ICD-10-CM

## 2019-11-22 DIAGNOSIS — F39 Unspecified mood [affective] disorder: Secondary | ICD-10-CM

## 2019-11-22 DIAGNOSIS — G301 Alzheimer's disease with late onset: Secondary | ICD-10-CM

## 2019-11-22 DIAGNOSIS — F5001 Anorexia nervosa, restricting type: Secondary | ICD-10-CM

## 2019-11-22 DIAGNOSIS — J9611 Chronic respiratory failure with hypoxia: Secondary | ICD-10-CM

## 2019-11-22 DIAGNOSIS — I700 Atherosclerosis of aorta: Secondary | ICD-10-CM

## 2019-11-26 DIAGNOSIS — N184 Chronic kidney disease, stage 4 (severe): Secondary | ICD-10-CM | POA: Diagnosis not present

## 2019-12-19 DIAGNOSIS — I6529 Occlusion and stenosis of unspecified carotid artery: Secondary | ICD-10-CM | POA: Diagnosis not present

## 2019-12-19 DIAGNOSIS — N183 Chronic kidney disease, stage 3 unspecified: Secondary | ICD-10-CM | POA: Diagnosis not present

## 2019-12-19 DIAGNOSIS — K219 Gastro-esophageal reflux disease without esophagitis: Secondary | ICD-10-CM | POA: Diagnosis not present

## 2019-12-19 DIAGNOSIS — G309 Alzheimer's disease, unspecified: Secondary | ICD-10-CM | POA: Diagnosis not present

## 2019-12-19 DIAGNOSIS — F39 Unspecified mood [affective] disorder: Secondary | ICD-10-CM | POA: Diagnosis not present

## 2019-12-19 DIAGNOSIS — E039 Hypothyroidism, unspecified: Secondary | ICD-10-CM | POA: Diagnosis not present

## 2019-12-19 DIAGNOSIS — J449 Chronic obstructive pulmonary disease, unspecified: Secondary | ICD-10-CM | POA: Diagnosis not present

## 2019-12-19 DIAGNOSIS — I2510 Atherosclerotic heart disease of native coronary artery without angina pectoris: Secondary | ICD-10-CM | POA: Diagnosis not present

## 2019-12-19 DIAGNOSIS — J069 Acute upper respiratory infection, unspecified: Secondary | ICD-10-CM | POA: Diagnosis not present

## 2019-12-19 DIAGNOSIS — I10 Essential (primary) hypertension: Secondary | ICD-10-CM | POA: Diagnosis not present

## 2019-12-19 DIAGNOSIS — D649 Anemia, unspecified: Secondary | ICD-10-CM | POA: Diagnosis not present

## 2020-02-14 DIAGNOSIS — G301 Alzheimer's disease with late onset: Secondary | ICD-10-CM | POA: Diagnosis not present

## 2020-02-14 DIAGNOSIS — J449 Chronic obstructive pulmonary disease, unspecified: Secondary | ICD-10-CM | POA: Diagnosis not present

## 2020-02-14 DIAGNOSIS — I700 Atherosclerosis of aorta: Secondary | ICD-10-CM | POA: Diagnosis not present

## 2020-02-14 DIAGNOSIS — E441 Mild protein-calorie malnutrition: Secondary | ICD-10-CM | POA: Diagnosis not present

## 2020-02-14 DIAGNOSIS — F39 Unspecified mood [affective] disorder: Secondary | ICD-10-CM | POA: Diagnosis not present

## 2020-02-14 DIAGNOSIS — J9611 Chronic respiratory failure with hypoxia: Secondary | ICD-10-CM | POA: Diagnosis not present

## 2020-02-14 DIAGNOSIS — N184 Chronic kidney disease, stage 4 (severe): Secondary | ICD-10-CM | POA: Diagnosis not present

## 2020-02-20 ENCOUNTER — Telehealth: Payer: Self-pay | Admitting: Cardiovascular Disease

## 2020-02-20 NOTE — Telephone Encounter (Signed)
Patient has been contacted at least 3 times for a recall, recall has been deleted

## 2020-04-16 DIAGNOSIS — F39 Unspecified mood [affective] disorder: Secondary | ICD-10-CM | POA: Diagnosis not present

## 2020-04-16 DIAGNOSIS — G309 Alzheimer's disease, unspecified: Secondary | ICD-10-CM | POA: Diagnosis not present

## 2020-04-16 DIAGNOSIS — I10 Essential (primary) hypertension: Secondary | ICD-10-CM | POA: Diagnosis not present

## 2020-04-16 DIAGNOSIS — K219 Gastro-esophageal reflux disease without esophagitis: Secondary | ICD-10-CM | POA: Diagnosis not present

## 2020-04-16 DIAGNOSIS — I700 Atherosclerosis of aorta: Secondary | ICD-10-CM | POA: Diagnosis not present

## 2020-04-16 DIAGNOSIS — E43 Unspecified severe protein-calorie malnutrition: Secondary | ICD-10-CM | POA: Diagnosis not present

## 2020-04-16 DIAGNOSIS — J449 Chronic obstructive pulmonary disease, unspecified: Secondary | ICD-10-CM | POA: Diagnosis not present

## 2020-04-16 DIAGNOSIS — N184 Chronic kidney disease, stage 4 (severe): Secondary | ICD-10-CM | POA: Diagnosis not present

## 2020-04-16 DIAGNOSIS — D649 Anemia, unspecified: Secondary | ICD-10-CM | POA: Diagnosis not present

## 2020-04-16 DIAGNOSIS — E039 Hypothyroidism, unspecified: Secondary | ICD-10-CM | POA: Diagnosis not present

## 2020-04-22 DIAGNOSIS — S0083XA Contusion of other part of head, initial encounter: Secondary | ICD-10-CM | POA: Diagnosis not present

## 2020-05-07 DIAGNOSIS — S42201D Unspecified fracture of upper end of right humerus, subsequent encounter for fracture with routine healing: Secondary | ICD-10-CM | POA: Diagnosis not present

## 2020-05-07 DIAGNOSIS — R2689 Other abnormalities of gait and mobility: Secondary | ICD-10-CM | POA: Diagnosis not present

## 2020-05-07 DIAGNOSIS — F0630 Mood disorder due to known physiological condition, unspecified: Secondary | ICD-10-CM | POA: Diagnosis not present

## 2020-05-07 DIAGNOSIS — M6281 Muscle weakness (generalized): Secondary | ICD-10-CM | POA: Diagnosis not present

## 2020-05-07 DIAGNOSIS — J449 Chronic obstructive pulmonary disease, unspecified: Secondary | ICD-10-CM | POA: Diagnosis not present

## 2020-05-07 DIAGNOSIS — R296 Repeated falls: Secondary | ICD-10-CM | POA: Diagnosis not present

## 2020-05-07 DIAGNOSIS — R278 Other lack of coordination: Secondary | ICD-10-CM | POA: Diagnosis not present

## 2020-05-07 DIAGNOSIS — I129 Hypertensive chronic kidney disease with stage 1 through stage 4 chronic kidney disease, or unspecified chronic kidney disease: Secondary | ICD-10-CM | POA: Diagnosis not present

## 2020-05-07 DIAGNOSIS — G309 Alzheimer's disease, unspecified: Secondary | ICD-10-CM | POA: Diagnosis not present

## 2020-05-13 DIAGNOSIS — I129 Hypertensive chronic kidney disease with stage 1 through stage 4 chronic kidney disease, or unspecified chronic kidney disease: Secondary | ICD-10-CM | POA: Diagnosis not present

## 2020-05-13 DIAGNOSIS — F0630 Mood disorder due to known physiological condition, unspecified: Secondary | ICD-10-CM | POA: Diagnosis not present

## 2020-05-13 DIAGNOSIS — M6281 Muscle weakness (generalized): Secondary | ICD-10-CM | POA: Diagnosis not present

## 2020-05-13 DIAGNOSIS — J449 Chronic obstructive pulmonary disease, unspecified: Secondary | ICD-10-CM | POA: Diagnosis not present

## 2020-05-13 DIAGNOSIS — R2689 Other abnormalities of gait and mobility: Secondary | ICD-10-CM | POA: Diagnosis not present

## 2020-05-13 DIAGNOSIS — G309 Alzheimer's disease, unspecified: Secondary | ICD-10-CM | POA: Diagnosis not present

## 2020-05-16 DIAGNOSIS — J449 Chronic obstructive pulmonary disease, unspecified: Secondary | ICD-10-CM | POA: Diagnosis not present

## 2020-05-16 DIAGNOSIS — M6281 Muscle weakness (generalized): Secondary | ICD-10-CM | POA: Diagnosis not present

## 2020-05-16 DIAGNOSIS — R2689 Other abnormalities of gait and mobility: Secondary | ICD-10-CM | POA: Diagnosis not present

## 2020-05-16 DIAGNOSIS — I129 Hypertensive chronic kidney disease with stage 1 through stage 4 chronic kidney disease, or unspecified chronic kidney disease: Secondary | ICD-10-CM | POA: Diagnosis not present

## 2020-05-16 DIAGNOSIS — G309 Alzheimer's disease, unspecified: Secondary | ICD-10-CM | POA: Diagnosis not present

## 2020-05-16 DIAGNOSIS — F0630 Mood disorder due to known physiological condition, unspecified: Secondary | ICD-10-CM | POA: Diagnosis not present

## 2020-05-19 DIAGNOSIS — J449 Chronic obstructive pulmonary disease, unspecified: Secondary | ICD-10-CM | POA: Diagnosis not present

## 2020-05-19 DIAGNOSIS — I129 Hypertensive chronic kidney disease with stage 1 through stage 4 chronic kidney disease, or unspecified chronic kidney disease: Secondary | ICD-10-CM | POA: Diagnosis not present

## 2020-05-19 DIAGNOSIS — R2689 Other abnormalities of gait and mobility: Secondary | ICD-10-CM | POA: Diagnosis not present

## 2020-05-19 DIAGNOSIS — F0630 Mood disorder due to known physiological condition, unspecified: Secondary | ICD-10-CM | POA: Diagnosis not present

## 2020-05-19 DIAGNOSIS — M6281 Muscle weakness (generalized): Secondary | ICD-10-CM | POA: Diagnosis not present

## 2020-05-19 DIAGNOSIS — G309 Alzheimer's disease, unspecified: Secondary | ICD-10-CM | POA: Diagnosis not present

## 2020-05-21 DIAGNOSIS — M6281 Muscle weakness (generalized): Secondary | ICD-10-CM | POA: Diagnosis not present

## 2020-05-21 DIAGNOSIS — I129 Hypertensive chronic kidney disease with stage 1 through stage 4 chronic kidney disease, or unspecified chronic kidney disease: Secondary | ICD-10-CM | POA: Diagnosis not present

## 2020-05-21 DIAGNOSIS — R2689 Other abnormalities of gait and mobility: Secondary | ICD-10-CM | POA: Diagnosis not present

## 2020-05-21 DIAGNOSIS — G309 Alzheimer's disease, unspecified: Secondary | ICD-10-CM | POA: Diagnosis not present

## 2020-05-21 DIAGNOSIS — J449 Chronic obstructive pulmonary disease, unspecified: Secondary | ICD-10-CM | POA: Diagnosis not present

## 2020-05-21 DIAGNOSIS — F0630 Mood disorder due to known physiological condition, unspecified: Secondary | ICD-10-CM | POA: Diagnosis not present

## 2020-05-23 DIAGNOSIS — M6281 Muscle weakness (generalized): Secondary | ICD-10-CM | POA: Diagnosis not present

## 2020-05-23 DIAGNOSIS — R2689 Other abnormalities of gait and mobility: Secondary | ICD-10-CM | POA: Diagnosis not present

## 2020-05-23 DIAGNOSIS — G309 Alzheimer's disease, unspecified: Secondary | ICD-10-CM | POA: Diagnosis not present

## 2020-05-23 DIAGNOSIS — Z23 Encounter for immunization: Secondary | ICD-10-CM | POA: Diagnosis not present

## 2020-05-23 DIAGNOSIS — F0630 Mood disorder due to known physiological condition, unspecified: Secondary | ICD-10-CM | POA: Diagnosis not present

## 2020-05-23 DIAGNOSIS — I129 Hypertensive chronic kidney disease with stage 1 through stage 4 chronic kidney disease, or unspecified chronic kidney disease: Secondary | ICD-10-CM | POA: Diagnosis not present

## 2020-05-23 DIAGNOSIS — J449 Chronic obstructive pulmonary disease, unspecified: Secondary | ICD-10-CM | POA: Diagnosis not present

## 2020-05-26 DIAGNOSIS — F0630 Mood disorder due to known physiological condition, unspecified: Secondary | ICD-10-CM | POA: Diagnosis not present

## 2020-05-26 DIAGNOSIS — M6281 Muscle weakness (generalized): Secondary | ICD-10-CM | POA: Diagnosis not present

## 2020-05-26 DIAGNOSIS — J449 Chronic obstructive pulmonary disease, unspecified: Secondary | ICD-10-CM | POA: Diagnosis not present

## 2020-05-26 DIAGNOSIS — I129 Hypertensive chronic kidney disease with stage 1 through stage 4 chronic kidney disease, or unspecified chronic kidney disease: Secondary | ICD-10-CM | POA: Diagnosis not present

## 2020-05-26 DIAGNOSIS — R2689 Other abnormalities of gait and mobility: Secondary | ICD-10-CM | POA: Diagnosis not present

## 2020-05-26 DIAGNOSIS — G309 Alzheimer's disease, unspecified: Secondary | ICD-10-CM | POA: Diagnosis not present

## 2020-05-28 DIAGNOSIS — F0630 Mood disorder due to known physiological condition, unspecified: Secondary | ICD-10-CM | POA: Diagnosis not present

## 2020-05-28 DIAGNOSIS — M6281 Muscle weakness (generalized): Secondary | ICD-10-CM | POA: Diagnosis not present

## 2020-05-28 DIAGNOSIS — J449 Chronic obstructive pulmonary disease, unspecified: Secondary | ICD-10-CM | POA: Diagnosis not present

## 2020-05-28 DIAGNOSIS — R2689 Other abnormalities of gait and mobility: Secondary | ICD-10-CM | POA: Diagnosis not present

## 2020-05-28 DIAGNOSIS — G309 Alzheimer's disease, unspecified: Secondary | ICD-10-CM | POA: Diagnosis not present

## 2020-05-28 DIAGNOSIS — I129 Hypertensive chronic kidney disease with stage 1 through stage 4 chronic kidney disease, or unspecified chronic kidney disease: Secondary | ICD-10-CM | POA: Diagnosis not present

## 2020-05-30 DIAGNOSIS — R2689 Other abnormalities of gait and mobility: Secondary | ICD-10-CM | POA: Diagnosis not present

## 2020-05-30 DIAGNOSIS — J449 Chronic obstructive pulmonary disease, unspecified: Secondary | ICD-10-CM | POA: Diagnosis not present

## 2020-05-30 DIAGNOSIS — G309 Alzheimer's disease, unspecified: Secondary | ICD-10-CM | POA: Diagnosis not present

## 2020-05-30 DIAGNOSIS — I129 Hypertensive chronic kidney disease with stage 1 through stage 4 chronic kidney disease, or unspecified chronic kidney disease: Secondary | ICD-10-CM | POA: Diagnosis not present

## 2020-05-30 DIAGNOSIS — M6281 Muscle weakness (generalized): Secondary | ICD-10-CM | POA: Diagnosis not present

## 2020-05-30 DIAGNOSIS — F0630 Mood disorder due to known physiological condition, unspecified: Secondary | ICD-10-CM | POA: Diagnosis not present

## 2020-06-03 DIAGNOSIS — M6281 Muscle weakness (generalized): Secondary | ICD-10-CM | POA: Diagnosis not present

## 2020-06-03 DIAGNOSIS — F0630 Mood disorder due to known physiological condition, unspecified: Secondary | ICD-10-CM | POA: Diagnosis not present

## 2020-06-03 DIAGNOSIS — R2689 Other abnormalities of gait and mobility: Secondary | ICD-10-CM | POA: Diagnosis not present

## 2020-06-03 DIAGNOSIS — G309 Alzheimer's disease, unspecified: Secondary | ICD-10-CM | POA: Diagnosis not present

## 2020-06-03 DIAGNOSIS — I129 Hypertensive chronic kidney disease with stage 1 through stage 4 chronic kidney disease, or unspecified chronic kidney disease: Secondary | ICD-10-CM | POA: Diagnosis not present

## 2020-06-03 DIAGNOSIS — J449 Chronic obstructive pulmonary disease, unspecified: Secondary | ICD-10-CM | POA: Diagnosis not present

## 2020-06-04 DIAGNOSIS — M6281 Muscle weakness (generalized): Secondary | ICD-10-CM | POA: Diagnosis not present

## 2020-06-04 DIAGNOSIS — F0630 Mood disorder due to known physiological condition, unspecified: Secondary | ICD-10-CM | POA: Diagnosis not present

## 2020-06-04 DIAGNOSIS — G309 Alzheimer's disease, unspecified: Secondary | ICD-10-CM | POA: Diagnosis not present

## 2020-06-04 DIAGNOSIS — R296 Repeated falls: Secondary | ICD-10-CM | POA: Diagnosis not present

## 2020-06-04 DIAGNOSIS — J449 Chronic obstructive pulmonary disease, unspecified: Secondary | ICD-10-CM | POA: Diagnosis not present

## 2020-06-04 DIAGNOSIS — R278 Other lack of coordination: Secondary | ICD-10-CM | POA: Diagnosis not present

## 2020-06-04 DIAGNOSIS — I129 Hypertensive chronic kidney disease with stage 1 through stage 4 chronic kidney disease, or unspecified chronic kidney disease: Secondary | ICD-10-CM | POA: Diagnosis not present

## 2020-06-04 DIAGNOSIS — R2689 Other abnormalities of gait and mobility: Secondary | ICD-10-CM | POA: Diagnosis not present

## 2020-06-04 DIAGNOSIS — S42201D Unspecified fracture of upper end of right humerus, subsequent encounter for fracture with routine healing: Secondary | ICD-10-CM | POA: Diagnosis not present

## 2020-06-06 DIAGNOSIS — R2689 Other abnormalities of gait and mobility: Secondary | ICD-10-CM | POA: Diagnosis not present

## 2020-06-06 DIAGNOSIS — G309 Alzheimer's disease, unspecified: Secondary | ICD-10-CM | POA: Diagnosis not present

## 2020-06-06 DIAGNOSIS — F0630 Mood disorder due to known physiological condition, unspecified: Secondary | ICD-10-CM | POA: Diagnosis not present

## 2020-06-06 DIAGNOSIS — M6281 Muscle weakness (generalized): Secondary | ICD-10-CM | POA: Diagnosis not present

## 2020-06-06 DIAGNOSIS — J449 Chronic obstructive pulmonary disease, unspecified: Secondary | ICD-10-CM | POA: Diagnosis not present

## 2020-06-06 DIAGNOSIS — I129 Hypertensive chronic kidney disease with stage 1 through stage 4 chronic kidney disease, or unspecified chronic kidney disease: Secondary | ICD-10-CM | POA: Diagnosis not present

## 2020-06-09 DIAGNOSIS — M6281 Muscle weakness (generalized): Secondary | ICD-10-CM | POA: Diagnosis not present

## 2020-06-09 DIAGNOSIS — I129 Hypertensive chronic kidney disease with stage 1 through stage 4 chronic kidney disease, or unspecified chronic kidney disease: Secondary | ICD-10-CM | POA: Diagnosis not present

## 2020-06-09 DIAGNOSIS — G309 Alzheimer's disease, unspecified: Secondary | ICD-10-CM | POA: Diagnosis not present

## 2020-06-09 DIAGNOSIS — F0630 Mood disorder due to known physiological condition, unspecified: Secondary | ICD-10-CM | POA: Diagnosis not present

## 2020-06-09 DIAGNOSIS — R2689 Other abnormalities of gait and mobility: Secondary | ICD-10-CM | POA: Diagnosis not present

## 2020-06-09 DIAGNOSIS — J449 Chronic obstructive pulmonary disease, unspecified: Secondary | ICD-10-CM | POA: Diagnosis not present

## 2020-06-11 DIAGNOSIS — F0630 Mood disorder due to known physiological condition, unspecified: Secondary | ICD-10-CM | POA: Diagnosis not present

## 2020-06-11 DIAGNOSIS — J449 Chronic obstructive pulmonary disease, unspecified: Secondary | ICD-10-CM | POA: Diagnosis not present

## 2020-06-11 DIAGNOSIS — M6281 Muscle weakness (generalized): Secondary | ICD-10-CM | POA: Diagnosis not present

## 2020-06-11 DIAGNOSIS — R2689 Other abnormalities of gait and mobility: Secondary | ICD-10-CM | POA: Diagnosis not present

## 2020-06-11 DIAGNOSIS — G309 Alzheimer's disease, unspecified: Secondary | ICD-10-CM | POA: Diagnosis not present

## 2020-06-11 DIAGNOSIS — I129 Hypertensive chronic kidney disease with stage 1 through stage 4 chronic kidney disease, or unspecified chronic kidney disease: Secondary | ICD-10-CM | POA: Diagnosis not present

## 2020-06-13 DIAGNOSIS — R2689 Other abnormalities of gait and mobility: Secondary | ICD-10-CM | POA: Diagnosis not present

## 2020-06-13 DIAGNOSIS — J449 Chronic obstructive pulmonary disease, unspecified: Secondary | ICD-10-CM | POA: Diagnosis not present

## 2020-06-13 DIAGNOSIS — M6281 Muscle weakness (generalized): Secondary | ICD-10-CM | POA: Diagnosis not present

## 2020-06-13 DIAGNOSIS — G309 Alzheimer's disease, unspecified: Secondary | ICD-10-CM | POA: Diagnosis not present

## 2020-06-13 DIAGNOSIS — F0630 Mood disorder due to known physiological condition, unspecified: Secondary | ICD-10-CM | POA: Diagnosis not present

## 2020-06-13 DIAGNOSIS — I129 Hypertensive chronic kidney disease with stage 1 through stage 4 chronic kidney disease, or unspecified chronic kidney disease: Secondary | ICD-10-CM | POA: Diagnosis not present

## 2020-06-16 DIAGNOSIS — M6281 Muscle weakness (generalized): Secondary | ICD-10-CM | POA: Diagnosis not present

## 2020-06-16 DIAGNOSIS — J449 Chronic obstructive pulmonary disease, unspecified: Secondary | ICD-10-CM | POA: Diagnosis not present

## 2020-06-16 DIAGNOSIS — I129 Hypertensive chronic kidney disease with stage 1 through stage 4 chronic kidney disease, or unspecified chronic kidney disease: Secondary | ICD-10-CM | POA: Diagnosis not present

## 2020-06-16 DIAGNOSIS — F0630 Mood disorder due to known physiological condition, unspecified: Secondary | ICD-10-CM | POA: Diagnosis not present

## 2020-06-16 DIAGNOSIS — R2689 Other abnormalities of gait and mobility: Secondary | ICD-10-CM | POA: Diagnosis not present

## 2020-06-16 DIAGNOSIS — G309 Alzheimer's disease, unspecified: Secondary | ICD-10-CM | POA: Diagnosis not present

## 2020-06-24 DIAGNOSIS — J449 Chronic obstructive pulmonary disease, unspecified: Secondary | ICD-10-CM | POA: Diagnosis not present

## 2020-06-24 DIAGNOSIS — J9611 Chronic respiratory failure with hypoxia: Secondary | ICD-10-CM | POA: Diagnosis not present

## 2020-06-24 DIAGNOSIS — I700 Atherosclerosis of aorta: Secondary | ICD-10-CM | POA: Diagnosis not present

## 2020-06-24 DIAGNOSIS — F39 Unspecified mood [affective] disorder: Secondary | ICD-10-CM | POA: Diagnosis not present

## 2020-06-24 DIAGNOSIS — E441 Mild protein-calorie malnutrition: Secondary | ICD-10-CM | POA: Diagnosis not present

## 2020-06-24 DIAGNOSIS — G301 Alzheimer's disease with late onset: Secondary | ICD-10-CM | POA: Diagnosis not present

## 2020-06-24 DIAGNOSIS — N184 Chronic kidney disease, stage 4 (severe): Secondary | ICD-10-CM | POA: Diagnosis not present

## 2020-07-10 DIAGNOSIS — B019 Varicella without complication: Secondary | ICD-10-CM | POA: Diagnosis not present

## 2020-07-17 DIAGNOSIS — E785 Hyperlipidemia, unspecified: Secondary | ICD-10-CM | POA: Diagnosis not present

## 2020-07-17 DIAGNOSIS — E039 Hypothyroidism, unspecified: Secondary | ICD-10-CM | POA: Diagnosis not present

## 2020-07-17 DIAGNOSIS — I10 Essential (primary) hypertension: Secondary | ICD-10-CM | POA: Diagnosis not present

## 2020-08-22 DIAGNOSIS — D649 Anemia, unspecified: Secondary | ICD-10-CM | POA: Diagnosis not present

## 2020-08-22 DIAGNOSIS — E43 Unspecified severe protein-calorie malnutrition: Secondary | ICD-10-CM | POA: Diagnosis not present

## 2020-08-22 DIAGNOSIS — N184 Chronic kidney disease, stage 4 (severe): Secondary | ICD-10-CM | POA: Diagnosis not present

## 2020-08-22 DIAGNOSIS — J9611 Chronic respiratory failure with hypoxia: Secondary | ICD-10-CM | POA: Diagnosis not present

## 2020-08-22 DIAGNOSIS — J449 Chronic obstructive pulmonary disease, unspecified: Secondary | ICD-10-CM | POA: Diagnosis not present

## 2020-08-22 DIAGNOSIS — G309 Alzheimer's disease, unspecified: Secondary | ICD-10-CM | POA: Diagnosis not present

## 2020-08-22 DIAGNOSIS — I10 Essential (primary) hypertension: Secondary | ICD-10-CM | POA: Diagnosis not present

## 2020-08-22 DIAGNOSIS — E079 Disorder of thyroid, unspecified: Secondary | ICD-10-CM | POA: Diagnosis not present

## 2020-08-22 DIAGNOSIS — K219 Gastro-esophageal reflux disease without esophagitis: Secondary | ICD-10-CM | POA: Diagnosis not present

## 2020-08-22 DIAGNOSIS — F39 Unspecified mood [affective] disorder: Secondary | ICD-10-CM | POA: Diagnosis not present

## 2020-09-26 DIAGNOSIS — Z23 Encounter for immunization: Secondary | ICD-10-CM | POA: Diagnosis not present

## 2020-10-23 DIAGNOSIS — J449 Chronic obstructive pulmonary disease, unspecified: Secondary | ICD-10-CM | POA: Diagnosis not present

## 2020-10-23 DIAGNOSIS — J9611 Chronic respiratory failure with hypoxia: Secondary | ICD-10-CM | POA: Diagnosis not present

## 2020-10-23 DIAGNOSIS — N184 Chronic kidney disease, stage 4 (severe): Secondary | ICD-10-CM | POA: Diagnosis not present

## 2020-10-23 DIAGNOSIS — G301 Alzheimer's disease with late onset: Secondary | ICD-10-CM | POA: Diagnosis not present

## 2020-10-23 DIAGNOSIS — E441 Mild protein-calorie malnutrition: Secondary | ICD-10-CM | POA: Diagnosis not present

## 2020-11-06 DIAGNOSIS — I10 Essential (primary) hypertension: Secondary | ICD-10-CM | POA: Diagnosis not present

## 2020-11-27 DIAGNOSIS — R059 Cough, unspecified: Secondary | ICD-10-CM | POA: Diagnosis not present

## 2020-11-27 DIAGNOSIS — R0989 Other specified symptoms and signs involving the circulatory and respiratory systems: Secondary | ICD-10-CM | POA: Diagnosis not present

## 2020-12-01 DIAGNOSIS — J449 Chronic obstructive pulmonary disease, unspecified: Secondary | ICD-10-CM | POA: Diagnosis not present

## 2020-12-17 DIAGNOSIS — D649 Anemia, unspecified: Secondary | ICD-10-CM | POA: Diagnosis not present

## 2020-12-17 DIAGNOSIS — K219 Gastro-esophageal reflux disease without esophagitis: Secondary | ICD-10-CM | POA: Diagnosis not present

## 2020-12-17 DIAGNOSIS — G309 Alzheimer's disease, unspecified: Secondary | ICD-10-CM | POA: Diagnosis not present

## 2020-12-17 DIAGNOSIS — E43 Unspecified severe protein-calorie malnutrition: Secondary | ICD-10-CM | POA: Diagnosis not present

## 2020-12-17 DIAGNOSIS — I10 Essential (primary) hypertension: Secondary | ICD-10-CM | POA: Diagnosis not present

## 2020-12-17 DIAGNOSIS — I700 Atherosclerosis of aorta: Secondary | ICD-10-CM | POA: Diagnosis not present

## 2020-12-17 DIAGNOSIS — N184 Chronic kidney disease, stage 4 (severe): Secondary | ICD-10-CM | POA: Diagnosis not present

## 2020-12-17 DIAGNOSIS — E039 Hypothyroidism, unspecified: Secondary | ICD-10-CM | POA: Diagnosis not present

## 2020-12-17 DIAGNOSIS — J449 Chronic obstructive pulmonary disease, unspecified: Secondary | ICD-10-CM | POA: Diagnosis not present

## 2020-12-17 DIAGNOSIS — J9611 Chronic respiratory failure with hypoxia: Secondary | ICD-10-CM | POA: Diagnosis not present

## 2020-12-25 DIAGNOSIS — G4700 Insomnia, unspecified: Secondary | ICD-10-CM | POA: Diagnosis not present

## 2020-12-25 DIAGNOSIS — J309 Allergic rhinitis, unspecified: Secondary | ICD-10-CM | POA: Diagnosis not present

## 2020-12-25 DIAGNOSIS — H409 Unspecified glaucoma: Secondary | ICD-10-CM | POA: Diagnosis not present

## 2020-12-25 DIAGNOSIS — J449 Chronic obstructive pulmonary disease, unspecified: Secondary | ICD-10-CM | POA: Diagnosis not present

## 2020-12-25 DIAGNOSIS — D631 Anemia in chronic kidney disease: Secondary | ICD-10-CM | POA: Diagnosis not present

## 2020-12-25 DIAGNOSIS — G629 Polyneuropathy, unspecified: Secondary | ICD-10-CM | POA: Diagnosis not present

## 2020-12-25 DIAGNOSIS — Z6820 Body mass index (BMI) 20.0-20.9, adult: Secondary | ICD-10-CM | POA: Diagnosis not present

## 2020-12-25 DIAGNOSIS — E785 Hyperlipidemia, unspecified: Secondary | ICD-10-CM | POA: Diagnosis not present

## 2020-12-25 DIAGNOSIS — G309 Alzheimer's disease, unspecified: Secondary | ICD-10-CM | POA: Diagnosis not present

## 2020-12-25 DIAGNOSIS — I252 Old myocardial infarction: Secondary | ICD-10-CM | POA: Diagnosis not present

## 2020-12-25 DIAGNOSIS — I25119 Atherosclerotic heart disease of native coronary artery with unspecified angina pectoris: Secondary | ICD-10-CM | POA: Diagnosis not present

## 2020-12-25 DIAGNOSIS — J9611 Chronic respiratory failure with hypoxia: Secondary | ICD-10-CM | POA: Diagnosis not present

## 2020-12-25 DIAGNOSIS — N184 Chronic kidney disease, stage 4 (severe): Secondary | ICD-10-CM | POA: Diagnosis not present

## 2020-12-25 DIAGNOSIS — I130 Hypertensive heart and chronic kidney disease with heart failure and stage 1 through stage 4 chronic kidney disease, or unspecified chronic kidney disease: Secondary | ICD-10-CM | POA: Diagnosis not present

## 2020-12-25 DIAGNOSIS — F0280 Dementia in other diseases classified elsewhere without behavioral disturbance: Secondary | ICD-10-CM | POA: Diagnosis not present

## 2020-12-25 DIAGNOSIS — R634 Abnormal weight loss: Secondary | ICD-10-CM | POA: Diagnosis not present

## 2020-12-25 DIAGNOSIS — Z8673 Personal history of transient ischemic attack (TIA), and cerebral infarction without residual deficits: Secondary | ICD-10-CM | POA: Diagnosis not present

## 2020-12-25 DIAGNOSIS — K219 Gastro-esophageal reflux disease without esophagitis: Secondary | ICD-10-CM | POA: Diagnosis not present

## 2020-12-25 DIAGNOSIS — I5030 Unspecified diastolic (congestive) heart failure: Secondary | ICD-10-CM | POA: Diagnosis not present

## 2020-12-25 DIAGNOSIS — E039 Hypothyroidism, unspecified: Secondary | ICD-10-CM | POA: Diagnosis not present

## 2020-12-26 DIAGNOSIS — G309 Alzheimer's disease, unspecified: Secondary | ICD-10-CM | POA: Diagnosis not present

## 2020-12-26 DIAGNOSIS — R634 Abnormal weight loss: Secondary | ICD-10-CM | POA: Diagnosis not present

## 2020-12-26 DIAGNOSIS — I252 Old myocardial infarction: Secondary | ICD-10-CM | POA: Diagnosis not present

## 2020-12-26 DIAGNOSIS — J9611 Chronic respiratory failure with hypoxia: Secondary | ICD-10-CM | POA: Diagnosis not present

## 2020-12-26 DIAGNOSIS — F0280 Dementia in other diseases classified elsewhere without behavioral disturbance: Secondary | ICD-10-CM | POA: Diagnosis not present

## 2020-12-26 DIAGNOSIS — I25119 Atherosclerotic heart disease of native coronary artery with unspecified angina pectoris: Secondary | ICD-10-CM | POA: Diagnosis not present

## 2020-12-29 DIAGNOSIS — G309 Alzheimer's disease, unspecified: Secondary | ICD-10-CM | POA: Diagnosis not present

## 2020-12-29 DIAGNOSIS — J9611 Chronic respiratory failure with hypoxia: Secondary | ICD-10-CM | POA: Diagnosis not present

## 2020-12-29 DIAGNOSIS — F0280 Dementia in other diseases classified elsewhere without behavioral disturbance: Secondary | ICD-10-CM | POA: Diagnosis not present

## 2020-12-29 DIAGNOSIS — I252 Old myocardial infarction: Secondary | ICD-10-CM | POA: Diagnosis not present

## 2020-12-29 DIAGNOSIS — I25119 Atherosclerotic heart disease of native coronary artery with unspecified angina pectoris: Secondary | ICD-10-CM | POA: Diagnosis not present

## 2020-12-29 DIAGNOSIS — R634 Abnormal weight loss: Secondary | ICD-10-CM | POA: Diagnosis not present

## 2020-12-31 DIAGNOSIS — G309 Alzheimer's disease, unspecified: Secondary | ICD-10-CM | POA: Diagnosis not present

## 2020-12-31 DIAGNOSIS — J9611 Chronic respiratory failure with hypoxia: Secondary | ICD-10-CM | POA: Diagnosis not present

## 2020-12-31 DIAGNOSIS — F0280 Dementia in other diseases classified elsewhere without behavioral disturbance: Secondary | ICD-10-CM | POA: Diagnosis not present

## 2020-12-31 DIAGNOSIS — I252 Old myocardial infarction: Secondary | ICD-10-CM | POA: Diagnosis not present

## 2020-12-31 DIAGNOSIS — I25119 Atherosclerotic heart disease of native coronary artery with unspecified angina pectoris: Secondary | ICD-10-CM | POA: Diagnosis not present

## 2020-12-31 DIAGNOSIS — R634 Abnormal weight loss: Secondary | ICD-10-CM | POA: Diagnosis not present

## 2021-01-01 DIAGNOSIS — R634 Abnormal weight loss: Secondary | ICD-10-CM | POA: Diagnosis not present

## 2021-01-01 DIAGNOSIS — I252 Old myocardial infarction: Secondary | ICD-10-CM | POA: Diagnosis not present

## 2021-01-01 DIAGNOSIS — F0280 Dementia in other diseases classified elsewhere without behavioral disturbance: Secondary | ICD-10-CM | POA: Diagnosis not present

## 2021-01-01 DIAGNOSIS — J9611 Chronic respiratory failure with hypoxia: Secondary | ICD-10-CM | POA: Diagnosis not present

## 2021-01-01 DIAGNOSIS — G309 Alzheimer's disease, unspecified: Secondary | ICD-10-CM | POA: Diagnosis not present

## 2021-01-01 DIAGNOSIS — I25119 Atherosclerotic heart disease of native coronary artery with unspecified angina pectoris: Secondary | ICD-10-CM | POA: Diagnosis not present

## 2021-01-04 DIAGNOSIS — K219 Gastro-esophageal reflux disease without esophagitis: Secondary | ICD-10-CM | POA: Diagnosis not present

## 2021-01-04 DIAGNOSIS — G629 Polyneuropathy, unspecified: Secondary | ICD-10-CM | POA: Diagnosis not present

## 2021-01-04 DIAGNOSIS — I25119 Atherosclerotic heart disease of native coronary artery with unspecified angina pectoris: Secondary | ICD-10-CM | POA: Diagnosis not present

## 2021-01-04 DIAGNOSIS — R634 Abnormal weight loss: Secondary | ICD-10-CM | POA: Diagnosis not present

## 2021-01-04 DIAGNOSIS — D631 Anemia in chronic kidney disease: Secondary | ICD-10-CM | POA: Diagnosis not present

## 2021-01-04 DIAGNOSIS — H409 Unspecified glaucoma: Secondary | ICD-10-CM | POA: Diagnosis not present

## 2021-01-04 DIAGNOSIS — G4700 Insomnia, unspecified: Secondary | ICD-10-CM | POA: Diagnosis not present

## 2021-01-04 DIAGNOSIS — E785 Hyperlipidemia, unspecified: Secondary | ICD-10-CM | POA: Diagnosis not present

## 2021-01-04 DIAGNOSIS — G309 Alzheimer's disease, unspecified: Secondary | ICD-10-CM | POA: Diagnosis not present

## 2021-01-04 DIAGNOSIS — I5030 Unspecified diastolic (congestive) heart failure: Secondary | ICD-10-CM | POA: Diagnosis not present

## 2021-01-04 DIAGNOSIS — F0280 Dementia in other diseases classified elsewhere without behavioral disturbance: Secondary | ICD-10-CM | POA: Diagnosis not present

## 2021-01-04 DIAGNOSIS — I252 Old myocardial infarction: Secondary | ICD-10-CM | POA: Diagnosis not present

## 2021-01-04 DIAGNOSIS — E039 Hypothyroidism, unspecified: Secondary | ICD-10-CM | POA: Diagnosis not present

## 2021-01-04 DIAGNOSIS — J309 Allergic rhinitis, unspecified: Secondary | ICD-10-CM | POA: Diagnosis not present

## 2021-01-04 DIAGNOSIS — Z6820 Body mass index (BMI) 20.0-20.9, adult: Secondary | ICD-10-CM | POA: Diagnosis not present

## 2021-01-04 DIAGNOSIS — J9611 Chronic respiratory failure with hypoxia: Secondary | ICD-10-CM | POA: Diagnosis not present

## 2021-01-04 DIAGNOSIS — I130 Hypertensive heart and chronic kidney disease with heart failure and stage 1 through stage 4 chronic kidney disease, or unspecified chronic kidney disease: Secondary | ICD-10-CM | POA: Diagnosis not present

## 2021-01-04 DIAGNOSIS — N184 Chronic kidney disease, stage 4 (severe): Secondary | ICD-10-CM | POA: Diagnosis not present

## 2021-01-04 DIAGNOSIS — Z8673 Personal history of transient ischemic attack (TIA), and cerebral infarction without residual deficits: Secondary | ICD-10-CM | POA: Diagnosis not present

## 2021-01-04 DIAGNOSIS — J449 Chronic obstructive pulmonary disease, unspecified: Secondary | ICD-10-CM | POA: Diagnosis not present

## 2021-01-05 DIAGNOSIS — F0280 Dementia in other diseases classified elsewhere without behavioral disturbance: Secondary | ICD-10-CM | POA: Diagnosis not present

## 2021-01-05 DIAGNOSIS — I25119 Atherosclerotic heart disease of native coronary artery with unspecified angina pectoris: Secondary | ICD-10-CM | POA: Diagnosis not present

## 2021-01-05 DIAGNOSIS — I252 Old myocardial infarction: Secondary | ICD-10-CM | POA: Diagnosis not present

## 2021-01-05 DIAGNOSIS — G309 Alzheimer's disease, unspecified: Secondary | ICD-10-CM | POA: Diagnosis not present

## 2021-01-05 DIAGNOSIS — R634 Abnormal weight loss: Secondary | ICD-10-CM | POA: Diagnosis not present

## 2021-01-05 DIAGNOSIS — J9611 Chronic respiratory failure with hypoxia: Secondary | ICD-10-CM | POA: Diagnosis not present

## 2021-01-07 DIAGNOSIS — I25119 Atherosclerotic heart disease of native coronary artery with unspecified angina pectoris: Secondary | ICD-10-CM | POA: Diagnosis not present

## 2021-01-07 DIAGNOSIS — G309 Alzheimer's disease, unspecified: Secondary | ICD-10-CM | POA: Diagnosis not present

## 2021-01-07 DIAGNOSIS — R634 Abnormal weight loss: Secondary | ICD-10-CM | POA: Diagnosis not present

## 2021-01-07 DIAGNOSIS — I252 Old myocardial infarction: Secondary | ICD-10-CM | POA: Diagnosis not present

## 2021-01-07 DIAGNOSIS — F0280 Dementia in other diseases classified elsewhere without behavioral disturbance: Secondary | ICD-10-CM | POA: Diagnosis not present

## 2021-01-07 DIAGNOSIS — J9611 Chronic respiratory failure with hypoxia: Secondary | ICD-10-CM | POA: Diagnosis not present

## 2021-01-08 DIAGNOSIS — J9611 Chronic respiratory failure with hypoxia: Secondary | ICD-10-CM | POA: Diagnosis not present

## 2021-01-08 DIAGNOSIS — R634 Abnormal weight loss: Secondary | ICD-10-CM | POA: Diagnosis not present

## 2021-01-08 DIAGNOSIS — I25119 Atherosclerotic heart disease of native coronary artery with unspecified angina pectoris: Secondary | ICD-10-CM | POA: Diagnosis not present

## 2021-01-08 DIAGNOSIS — F0280 Dementia in other diseases classified elsewhere without behavioral disturbance: Secondary | ICD-10-CM | POA: Diagnosis not present

## 2021-01-08 DIAGNOSIS — I252 Old myocardial infarction: Secondary | ICD-10-CM | POA: Diagnosis not present

## 2021-01-08 DIAGNOSIS — G309 Alzheimer's disease, unspecified: Secondary | ICD-10-CM | POA: Diagnosis not present

## 2021-01-12 DIAGNOSIS — F0280 Dementia in other diseases classified elsewhere without behavioral disturbance: Secondary | ICD-10-CM | POA: Diagnosis not present

## 2021-01-12 DIAGNOSIS — I25119 Atherosclerotic heart disease of native coronary artery with unspecified angina pectoris: Secondary | ICD-10-CM | POA: Diagnosis not present

## 2021-01-12 DIAGNOSIS — G309 Alzheimer's disease, unspecified: Secondary | ICD-10-CM | POA: Diagnosis not present

## 2021-01-12 DIAGNOSIS — J9611 Chronic respiratory failure with hypoxia: Secondary | ICD-10-CM | POA: Diagnosis not present

## 2021-01-12 DIAGNOSIS — R634 Abnormal weight loss: Secondary | ICD-10-CM | POA: Diagnosis not present

## 2021-01-12 DIAGNOSIS — I252 Old myocardial infarction: Secondary | ICD-10-CM | POA: Diagnosis not present

## 2021-01-15 DIAGNOSIS — J9611 Chronic respiratory failure with hypoxia: Secondary | ICD-10-CM | POA: Diagnosis not present

## 2021-01-15 DIAGNOSIS — I252 Old myocardial infarction: Secondary | ICD-10-CM | POA: Diagnosis not present

## 2021-01-15 DIAGNOSIS — G309 Alzheimer's disease, unspecified: Secondary | ICD-10-CM | POA: Diagnosis not present

## 2021-01-15 DIAGNOSIS — I25119 Atherosclerotic heart disease of native coronary artery with unspecified angina pectoris: Secondary | ICD-10-CM | POA: Diagnosis not present

## 2021-01-15 DIAGNOSIS — F0280 Dementia in other diseases classified elsewhere without behavioral disturbance: Secondary | ICD-10-CM | POA: Diagnosis not present

## 2021-01-15 DIAGNOSIS — R634 Abnormal weight loss: Secondary | ICD-10-CM | POA: Diagnosis not present

## 2021-01-19 DIAGNOSIS — F0280 Dementia in other diseases classified elsewhere without behavioral disturbance: Secondary | ICD-10-CM | POA: Diagnosis not present

## 2021-01-19 DIAGNOSIS — J9611 Chronic respiratory failure with hypoxia: Secondary | ICD-10-CM | POA: Diagnosis not present

## 2021-01-19 DIAGNOSIS — R634 Abnormal weight loss: Secondary | ICD-10-CM | POA: Diagnosis not present

## 2021-01-19 DIAGNOSIS — I25119 Atherosclerotic heart disease of native coronary artery with unspecified angina pectoris: Secondary | ICD-10-CM | POA: Diagnosis not present

## 2021-01-19 DIAGNOSIS — G309 Alzheimer's disease, unspecified: Secondary | ICD-10-CM | POA: Diagnosis not present

## 2021-01-19 DIAGNOSIS — I252 Old myocardial infarction: Secondary | ICD-10-CM | POA: Diagnosis not present

## 2021-01-22 DIAGNOSIS — F0280 Dementia in other diseases classified elsewhere without behavioral disturbance: Secondary | ICD-10-CM | POA: Diagnosis not present

## 2021-01-22 DIAGNOSIS — I252 Old myocardial infarction: Secondary | ICD-10-CM | POA: Diagnosis not present

## 2021-01-22 DIAGNOSIS — J9611 Chronic respiratory failure with hypoxia: Secondary | ICD-10-CM | POA: Diagnosis not present

## 2021-01-22 DIAGNOSIS — I25119 Atherosclerotic heart disease of native coronary artery with unspecified angina pectoris: Secondary | ICD-10-CM | POA: Diagnosis not present

## 2021-01-22 DIAGNOSIS — R634 Abnormal weight loss: Secondary | ICD-10-CM | POA: Diagnosis not present

## 2021-01-22 DIAGNOSIS — G309 Alzheimer's disease, unspecified: Secondary | ICD-10-CM | POA: Diagnosis not present

## 2021-01-26 DIAGNOSIS — G309 Alzheimer's disease, unspecified: Secondary | ICD-10-CM | POA: Diagnosis not present

## 2021-01-26 DIAGNOSIS — I252 Old myocardial infarction: Secondary | ICD-10-CM | POA: Diagnosis not present

## 2021-01-26 DIAGNOSIS — R634 Abnormal weight loss: Secondary | ICD-10-CM | POA: Diagnosis not present

## 2021-01-26 DIAGNOSIS — F0280 Dementia in other diseases classified elsewhere without behavioral disturbance: Secondary | ICD-10-CM | POA: Diagnosis not present

## 2021-01-26 DIAGNOSIS — I25119 Atherosclerotic heart disease of native coronary artery with unspecified angina pectoris: Secondary | ICD-10-CM | POA: Diagnosis not present

## 2021-01-26 DIAGNOSIS — J9611 Chronic respiratory failure with hypoxia: Secondary | ICD-10-CM | POA: Diagnosis not present

## 2021-01-27 DIAGNOSIS — F0280 Dementia in other diseases classified elsewhere without behavioral disturbance: Secondary | ICD-10-CM | POA: Diagnosis not present

## 2021-01-27 DIAGNOSIS — R634 Abnormal weight loss: Secondary | ICD-10-CM | POA: Diagnosis not present

## 2021-01-27 DIAGNOSIS — I25119 Atherosclerotic heart disease of native coronary artery with unspecified angina pectoris: Secondary | ICD-10-CM | POA: Diagnosis not present

## 2021-01-27 DIAGNOSIS — J9611 Chronic respiratory failure with hypoxia: Secondary | ICD-10-CM | POA: Diagnosis not present

## 2021-01-27 DIAGNOSIS — I252 Old myocardial infarction: Secondary | ICD-10-CM | POA: Diagnosis not present

## 2021-01-27 DIAGNOSIS — G309 Alzheimer's disease, unspecified: Secondary | ICD-10-CM | POA: Diagnosis not present

## 2021-01-29 DIAGNOSIS — R634 Abnormal weight loss: Secondary | ICD-10-CM | POA: Diagnosis not present

## 2021-01-29 DIAGNOSIS — I252 Old myocardial infarction: Secondary | ICD-10-CM | POA: Diagnosis not present

## 2021-01-29 DIAGNOSIS — J9611 Chronic respiratory failure with hypoxia: Secondary | ICD-10-CM | POA: Diagnosis not present

## 2021-01-29 DIAGNOSIS — F0280 Dementia in other diseases classified elsewhere without behavioral disturbance: Secondary | ICD-10-CM | POA: Diagnosis not present

## 2021-01-29 DIAGNOSIS — I25119 Atherosclerotic heart disease of native coronary artery with unspecified angina pectoris: Secondary | ICD-10-CM | POA: Diagnosis not present

## 2021-01-29 DIAGNOSIS — G309 Alzheimer's disease, unspecified: Secondary | ICD-10-CM | POA: Diagnosis not present

## 2021-01-29 DIAGNOSIS — R21 Rash and other nonspecific skin eruption: Secondary | ICD-10-CM | POA: Diagnosis not present

## 2021-02-02 DIAGNOSIS — R634 Abnormal weight loss: Secondary | ICD-10-CM | POA: Diagnosis not present

## 2021-02-02 DIAGNOSIS — I25119 Atherosclerotic heart disease of native coronary artery with unspecified angina pectoris: Secondary | ICD-10-CM | POA: Diagnosis not present

## 2021-02-02 DIAGNOSIS — J9611 Chronic respiratory failure with hypoxia: Secondary | ICD-10-CM | POA: Diagnosis not present

## 2021-02-02 DIAGNOSIS — I252 Old myocardial infarction: Secondary | ICD-10-CM | POA: Diagnosis not present

## 2021-02-02 DIAGNOSIS — G309 Alzheimer's disease, unspecified: Secondary | ICD-10-CM | POA: Diagnosis not present

## 2021-02-02 DIAGNOSIS — F0280 Dementia in other diseases classified elsewhere without behavioral disturbance: Secondary | ICD-10-CM | POA: Diagnosis not present

## 2021-02-04 DIAGNOSIS — G629 Polyneuropathy, unspecified: Secondary | ICD-10-CM | POA: Diagnosis not present

## 2021-02-04 DIAGNOSIS — E039 Hypothyroidism, unspecified: Secondary | ICD-10-CM | POA: Diagnosis not present

## 2021-02-04 DIAGNOSIS — F0280 Dementia in other diseases classified elsewhere without behavioral disturbance: Secondary | ICD-10-CM | POA: Diagnosis not present

## 2021-02-04 DIAGNOSIS — J9611 Chronic respiratory failure with hypoxia: Secondary | ICD-10-CM | POA: Diagnosis not present

## 2021-02-04 DIAGNOSIS — I252 Old myocardial infarction: Secondary | ICD-10-CM | POA: Diagnosis not present

## 2021-02-04 DIAGNOSIS — K219 Gastro-esophageal reflux disease without esophagitis: Secondary | ICD-10-CM | POA: Diagnosis not present

## 2021-02-04 DIAGNOSIS — G4700 Insomnia, unspecified: Secondary | ICD-10-CM | POA: Diagnosis not present

## 2021-02-04 DIAGNOSIS — I130 Hypertensive heart and chronic kidney disease with heart failure and stage 1 through stage 4 chronic kidney disease, or unspecified chronic kidney disease: Secondary | ICD-10-CM | POA: Diagnosis not present

## 2021-02-04 DIAGNOSIS — Z6820 Body mass index (BMI) 20.0-20.9, adult: Secondary | ICD-10-CM | POA: Diagnosis not present

## 2021-02-04 DIAGNOSIS — R634 Abnormal weight loss: Secondary | ICD-10-CM | POA: Diagnosis not present

## 2021-02-04 DIAGNOSIS — G309 Alzheimer's disease, unspecified: Secondary | ICD-10-CM | POA: Diagnosis not present

## 2021-02-04 DIAGNOSIS — N184 Chronic kidney disease, stage 4 (severe): Secondary | ICD-10-CM | POA: Diagnosis not present

## 2021-02-04 DIAGNOSIS — J449 Chronic obstructive pulmonary disease, unspecified: Secondary | ICD-10-CM | POA: Diagnosis not present

## 2021-02-04 DIAGNOSIS — J309 Allergic rhinitis, unspecified: Secondary | ICD-10-CM | POA: Diagnosis not present

## 2021-02-04 DIAGNOSIS — I25119 Atherosclerotic heart disease of native coronary artery with unspecified angina pectoris: Secondary | ICD-10-CM | POA: Diagnosis not present

## 2021-02-04 DIAGNOSIS — I5030 Unspecified diastolic (congestive) heart failure: Secondary | ICD-10-CM | POA: Diagnosis not present

## 2021-02-04 DIAGNOSIS — H409 Unspecified glaucoma: Secondary | ICD-10-CM | POA: Diagnosis not present

## 2021-02-04 DIAGNOSIS — D631 Anemia in chronic kidney disease: Secondary | ICD-10-CM | POA: Diagnosis not present

## 2021-02-04 DIAGNOSIS — Z8673 Personal history of transient ischemic attack (TIA), and cerebral infarction without residual deficits: Secondary | ICD-10-CM | POA: Diagnosis not present

## 2021-02-04 DIAGNOSIS — E785 Hyperlipidemia, unspecified: Secondary | ICD-10-CM | POA: Diagnosis not present

## 2021-02-05 DIAGNOSIS — F0280 Dementia in other diseases classified elsewhere without behavioral disturbance: Secondary | ICD-10-CM | POA: Diagnosis not present

## 2021-02-05 DIAGNOSIS — G309 Alzheimer's disease, unspecified: Secondary | ICD-10-CM | POA: Diagnosis not present

## 2021-02-05 DIAGNOSIS — I252 Old myocardial infarction: Secondary | ICD-10-CM | POA: Diagnosis not present

## 2021-02-05 DIAGNOSIS — J9611 Chronic respiratory failure with hypoxia: Secondary | ICD-10-CM | POA: Diagnosis not present

## 2021-02-05 DIAGNOSIS — R634 Abnormal weight loss: Secondary | ICD-10-CM | POA: Diagnosis not present

## 2021-02-05 DIAGNOSIS — I25119 Atherosclerotic heart disease of native coronary artery with unspecified angina pectoris: Secondary | ICD-10-CM | POA: Diagnosis not present

## 2021-02-06 DIAGNOSIS — I252 Old myocardial infarction: Secondary | ICD-10-CM | POA: Diagnosis not present

## 2021-02-06 DIAGNOSIS — R634 Abnormal weight loss: Secondary | ICD-10-CM | POA: Diagnosis not present

## 2021-02-06 DIAGNOSIS — J9611 Chronic respiratory failure with hypoxia: Secondary | ICD-10-CM | POA: Diagnosis not present

## 2021-02-06 DIAGNOSIS — F0280 Dementia in other diseases classified elsewhere without behavioral disturbance: Secondary | ICD-10-CM | POA: Diagnosis not present

## 2021-02-06 DIAGNOSIS — I25119 Atherosclerotic heart disease of native coronary artery with unspecified angina pectoris: Secondary | ICD-10-CM | POA: Diagnosis not present

## 2021-02-06 DIAGNOSIS — G309 Alzheimer's disease, unspecified: Secondary | ICD-10-CM | POA: Diagnosis not present

## 2021-02-09 DIAGNOSIS — I25119 Atherosclerotic heart disease of native coronary artery with unspecified angina pectoris: Secondary | ICD-10-CM | POA: Diagnosis not present

## 2021-02-09 DIAGNOSIS — R634 Abnormal weight loss: Secondary | ICD-10-CM | POA: Diagnosis not present

## 2021-02-09 DIAGNOSIS — I252 Old myocardial infarction: Secondary | ICD-10-CM | POA: Diagnosis not present

## 2021-02-09 DIAGNOSIS — J9611 Chronic respiratory failure with hypoxia: Secondary | ICD-10-CM | POA: Diagnosis not present

## 2021-02-09 DIAGNOSIS — G309 Alzheimer's disease, unspecified: Secondary | ICD-10-CM | POA: Diagnosis not present

## 2021-02-09 DIAGNOSIS — F0280 Dementia in other diseases classified elsewhere without behavioral disturbance: Secondary | ICD-10-CM | POA: Diagnosis not present

## 2021-02-10 DIAGNOSIS — R634 Abnormal weight loss: Secondary | ICD-10-CM | POA: Diagnosis not present

## 2021-02-10 DIAGNOSIS — J9611 Chronic respiratory failure with hypoxia: Secondary | ICD-10-CM | POA: Diagnosis not present

## 2021-02-10 DIAGNOSIS — G309 Alzheimer's disease, unspecified: Secondary | ICD-10-CM | POA: Diagnosis not present

## 2021-02-10 DIAGNOSIS — I252 Old myocardial infarction: Secondary | ICD-10-CM | POA: Diagnosis not present

## 2021-02-10 DIAGNOSIS — I25119 Atherosclerotic heart disease of native coronary artery with unspecified angina pectoris: Secondary | ICD-10-CM | POA: Diagnosis not present

## 2021-02-10 DIAGNOSIS — F0280 Dementia in other diseases classified elsewhere without behavioral disturbance: Secondary | ICD-10-CM | POA: Diagnosis not present

## 2021-02-11 DIAGNOSIS — G309 Alzheimer's disease, unspecified: Secondary | ICD-10-CM | POA: Diagnosis not present

## 2021-02-11 DIAGNOSIS — I252 Old myocardial infarction: Secondary | ICD-10-CM | POA: Diagnosis not present

## 2021-02-11 DIAGNOSIS — F0280 Dementia in other diseases classified elsewhere without behavioral disturbance: Secondary | ICD-10-CM | POA: Diagnosis not present

## 2021-02-11 DIAGNOSIS — J9611 Chronic respiratory failure with hypoxia: Secondary | ICD-10-CM | POA: Diagnosis not present

## 2021-02-11 DIAGNOSIS — I25119 Atherosclerotic heart disease of native coronary artery with unspecified angina pectoris: Secondary | ICD-10-CM | POA: Diagnosis not present

## 2021-02-11 DIAGNOSIS — R634 Abnormal weight loss: Secondary | ICD-10-CM | POA: Diagnosis not present

## 2021-02-12 DIAGNOSIS — I252 Old myocardial infarction: Secondary | ICD-10-CM | POA: Diagnosis not present

## 2021-02-12 DIAGNOSIS — G309 Alzheimer's disease, unspecified: Secondary | ICD-10-CM | POA: Diagnosis not present

## 2021-02-12 DIAGNOSIS — J9611 Chronic respiratory failure with hypoxia: Secondary | ICD-10-CM | POA: Diagnosis not present

## 2021-02-12 DIAGNOSIS — R634 Abnormal weight loss: Secondary | ICD-10-CM | POA: Diagnosis not present

## 2021-02-12 DIAGNOSIS — I25119 Atherosclerotic heart disease of native coronary artery with unspecified angina pectoris: Secondary | ICD-10-CM | POA: Diagnosis not present

## 2021-02-12 DIAGNOSIS — F0280 Dementia in other diseases classified elsewhere without behavioral disturbance: Secondary | ICD-10-CM | POA: Diagnosis not present

## 2021-02-16 DIAGNOSIS — G301 Alzheimer's disease with late onset: Secondary | ICD-10-CM | POA: Diagnosis not present

## 2021-02-16 DIAGNOSIS — J449 Chronic obstructive pulmonary disease, unspecified: Secondary | ICD-10-CM | POA: Diagnosis not present

## 2021-02-16 DIAGNOSIS — N1832 Chronic kidney disease, stage 3b: Secondary | ICD-10-CM | POA: Diagnosis not present

## 2021-02-16 DIAGNOSIS — E440 Moderate protein-calorie malnutrition: Secondary | ICD-10-CM | POA: Diagnosis not present

## 2021-02-16 DIAGNOSIS — J9611 Chronic respiratory failure with hypoxia: Secondary | ICD-10-CM | POA: Diagnosis not present

## 2021-02-16 DIAGNOSIS — F39 Unspecified mood [affective] disorder: Secondary | ICD-10-CM | POA: Diagnosis not present

## 2021-02-17 DIAGNOSIS — G309 Alzheimer's disease, unspecified: Secondary | ICD-10-CM | POA: Diagnosis not present

## 2021-02-17 DIAGNOSIS — R634 Abnormal weight loss: Secondary | ICD-10-CM | POA: Diagnosis not present

## 2021-02-17 DIAGNOSIS — F0280 Dementia in other diseases classified elsewhere without behavioral disturbance: Secondary | ICD-10-CM | POA: Diagnosis not present

## 2021-02-17 DIAGNOSIS — I25119 Atherosclerotic heart disease of native coronary artery with unspecified angina pectoris: Secondary | ICD-10-CM | POA: Diagnosis not present

## 2021-02-17 DIAGNOSIS — I252 Old myocardial infarction: Secondary | ICD-10-CM | POA: Diagnosis not present

## 2021-02-17 DIAGNOSIS — J9611 Chronic respiratory failure with hypoxia: Secondary | ICD-10-CM | POA: Diagnosis not present

## 2021-02-19 DIAGNOSIS — F0280 Dementia in other diseases classified elsewhere without behavioral disturbance: Secondary | ICD-10-CM | POA: Diagnosis not present

## 2021-02-19 DIAGNOSIS — R634 Abnormal weight loss: Secondary | ICD-10-CM | POA: Diagnosis not present

## 2021-02-19 DIAGNOSIS — J9611 Chronic respiratory failure with hypoxia: Secondary | ICD-10-CM | POA: Diagnosis not present

## 2021-02-19 DIAGNOSIS — I252 Old myocardial infarction: Secondary | ICD-10-CM | POA: Diagnosis not present

## 2021-02-19 DIAGNOSIS — G309 Alzheimer's disease, unspecified: Secondary | ICD-10-CM | POA: Diagnosis not present

## 2021-02-19 DIAGNOSIS — I25119 Atherosclerotic heart disease of native coronary artery with unspecified angina pectoris: Secondary | ICD-10-CM | POA: Diagnosis not present

## 2021-02-20 DIAGNOSIS — I25119 Atherosclerotic heart disease of native coronary artery with unspecified angina pectoris: Secondary | ICD-10-CM | POA: Diagnosis not present

## 2021-02-20 DIAGNOSIS — I252 Old myocardial infarction: Secondary | ICD-10-CM | POA: Diagnosis not present

## 2021-02-20 DIAGNOSIS — G309 Alzheimer's disease, unspecified: Secondary | ICD-10-CM | POA: Diagnosis not present

## 2021-02-20 DIAGNOSIS — R634 Abnormal weight loss: Secondary | ICD-10-CM | POA: Diagnosis not present

## 2021-02-20 DIAGNOSIS — J9611 Chronic respiratory failure with hypoxia: Secondary | ICD-10-CM | POA: Diagnosis not present

## 2021-02-20 DIAGNOSIS — F0280 Dementia in other diseases classified elsewhere without behavioral disturbance: Secondary | ICD-10-CM | POA: Diagnosis not present

## 2021-02-24 DIAGNOSIS — R634 Abnormal weight loss: Secondary | ICD-10-CM | POA: Diagnosis not present

## 2021-02-24 DIAGNOSIS — J9611 Chronic respiratory failure with hypoxia: Secondary | ICD-10-CM | POA: Diagnosis not present

## 2021-02-24 DIAGNOSIS — I25119 Atherosclerotic heart disease of native coronary artery with unspecified angina pectoris: Secondary | ICD-10-CM | POA: Diagnosis not present

## 2021-02-24 DIAGNOSIS — F0280 Dementia in other diseases classified elsewhere without behavioral disturbance: Secondary | ICD-10-CM | POA: Diagnosis not present

## 2021-02-24 DIAGNOSIS — G309 Alzheimer's disease, unspecified: Secondary | ICD-10-CM | POA: Diagnosis not present

## 2021-02-24 DIAGNOSIS — I252 Old myocardial infarction: Secondary | ICD-10-CM | POA: Diagnosis not present

## 2021-02-26 DIAGNOSIS — I25119 Atherosclerotic heart disease of native coronary artery with unspecified angina pectoris: Secondary | ICD-10-CM | POA: Diagnosis not present

## 2021-02-26 DIAGNOSIS — F0280 Dementia in other diseases classified elsewhere without behavioral disturbance: Secondary | ICD-10-CM | POA: Diagnosis not present

## 2021-02-26 DIAGNOSIS — J9611 Chronic respiratory failure with hypoxia: Secondary | ICD-10-CM | POA: Diagnosis not present

## 2021-02-26 DIAGNOSIS — R634 Abnormal weight loss: Secondary | ICD-10-CM | POA: Diagnosis not present

## 2021-02-26 DIAGNOSIS — I252 Old myocardial infarction: Secondary | ICD-10-CM | POA: Diagnosis not present

## 2021-02-26 DIAGNOSIS — G309 Alzheimer's disease, unspecified: Secondary | ICD-10-CM | POA: Diagnosis not present

## 2021-03-03 DIAGNOSIS — I252 Old myocardial infarction: Secondary | ICD-10-CM | POA: Diagnosis not present

## 2021-03-03 DIAGNOSIS — I25119 Atherosclerotic heart disease of native coronary artery with unspecified angina pectoris: Secondary | ICD-10-CM | POA: Diagnosis not present

## 2021-03-03 DIAGNOSIS — F0280 Dementia in other diseases classified elsewhere without behavioral disturbance: Secondary | ICD-10-CM | POA: Diagnosis not present

## 2021-03-03 DIAGNOSIS — G309 Alzheimer's disease, unspecified: Secondary | ICD-10-CM | POA: Diagnosis not present

## 2021-03-03 DIAGNOSIS — J9611 Chronic respiratory failure with hypoxia: Secondary | ICD-10-CM | POA: Diagnosis not present

## 2021-03-03 DIAGNOSIS — R634 Abnormal weight loss: Secondary | ICD-10-CM | POA: Diagnosis not present

## 2021-03-04 DIAGNOSIS — G629 Polyneuropathy, unspecified: Secondary | ICD-10-CM | POA: Diagnosis not present

## 2021-03-04 DIAGNOSIS — J309 Allergic rhinitis, unspecified: Secondary | ICD-10-CM | POA: Diagnosis not present

## 2021-03-04 DIAGNOSIS — I25119 Atherosclerotic heart disease of native coronary artery with unspecified angina pectoris: Secondary | ICD-10-CM | POA: Diagnosis not present

## 2021-03-04 DIAGNOSIS — J449 Chronic obstructive pulmonary disease, unspecified: Secondary | ICD-10-CM | POA: Diagnosis not present

## 2021-03-04 DIAGNOSIS — K219 Gastro-esophageal reflux disease without esophagitis: Secondary | ICD-10-CM | POA: Diagnosis not present

## 2021-03-04 DIAGNOSIS — E039 Hypothyroidism, unspecified: Secondary | ICD-10-CM | POA: Diagnosis not present

## 2021-03-04 DIAGNOSIS — R634 Abnormal weight loss: Secondary | ICD-10-CM | POA: Diagnosis not present

## 2021-03-04 DIAGNOSIS — I252 Old myocardial infarction: Secondary | ICD-10-CM | POA: Diagnosis not present

## 2021-03-04 DIAGNOSIS — Z6820 Body mass index (BMI) 20.0-20.9, adult: Secondary | ICD-10-CM | POA: Diagnosis not present

## 2021-03-04 DIAGNOSIS — E785 Hyperlipidemia, unspecified: Secondary | ICD-10-CM | POA: Diagnosis not present

## 2021-03-04 DIAGNOSIS — D631 Anemia in chronic kidney disease: Secondary | ICD-10-CM | POA: Diagnosis not present

## 2021-03-04 DIAGNOSIS — F0280 Dementia in other diseases classified elsewhere without behavioral disturbance: Secondary | ICD-10-CM | POA: Diagnosis not present

## 2021-03-04 DIAGNOSIS — I5030 Unspecified diastolic (congestive) heart failure: Secondary | ICD-10-CM | POA: Diagnosis not present

## 2021-03-04 DIAGNOSIS — J9611 Chronic respiratory failure with hypoxia: Secondary | ICD-10-CM | POA: Diagnosis not present

## 2021-03-04 DIAGNOSIS — G309 Alzheimer's disease, unspecified: Secondary | ICD-10-CM | POA: Diagnosis not present

## 2021-03-04 DIAGNOSIS — I130 Hypertensive heart and chronic kidney disease with heart failure and stage 1 through stage 4 chronic kidney disease, or unspecified chronic kidney disease: Secondary | ICD-10-CM | POA: Diagnosis not present

## 2021-03-04 DIAGNOSIS — H409 Unspecified glaucoma: Secondary | ICD-10-CM | POA: Diagnosis not present

## 2021-03-04 DIAGNOSIS — G4700 Insomnia, unspecified: Secondary | ICD-10-CM | POA: Diagnosis not present

## 2021-03-04 DIAGNOSIS — Z8673 Personal history of transient ischemic attack (TIA), and cerebral infarction without residual deficits: Secondary | ICD-10-CM | POA: Diagnosis not present

## 2021-03-04 DIAGNOSIS — N184 Chronic kidney disease, stage 4 (severe): Secondary | ICD-10-CM | POA: Diagnosis not present

## 2021-03-05 DIAGNOSIS — I25119 Atherosclerotic heart disease of native coronary artery with unspecified angina pectoris: Secondary | ICD-10-CM | POA: Diagnosis not present

## 2021-03-05 DIAGNOSIS — F0280 Dementia in other diseases classified elsewhere without behavioral disturbance: Secondary | ICD-10-CM | POA: Diagnosis not present

## 2021-03-05 DIAGNOSIS — I252 Old myocardial infarction: Secondary | ICD-10-CM | POA: Diagnosis not present

## 2021-03-05 DIAGNOSIS — R634 Abnormal weight loss: Secondary | ICD-10-CM | POA: Diagnosis not present

## 2021-03-05 DIAGNOSIS — G309 Alzheimer's disease, unspecified: Secondary | ICD-10-CM | POA: Diagnosis not present

## 2021-03-05 DIAGNOSIS — J9611 Chronic respiratory failure with hypoxia: Secondary | ICD-10-CM | POA: Diagnosis not present

## 2021-03-06 DIAGNOSIS — F0280 Dementia in other diseases classified elsewhere without behavioral disturbance: Secondary | ICD-10-CM | POA: Diagnosis not present

## 2021-03-06 DIAGNOSIS — I252 Old myocardial infarction: Secondary | ICD-10-CM | POA: Diagnosis not present

## 2021-03-06 DIAGNOSIS — G309 Alzheimer's disease, unspecified: Secondary | ICD-10-CM | POA: Diagnosis not present

## 2021-03-06 DIAGNOSIS — I25119 Atherosclerotic heart disease of native coronary artery with unspecified angina pectoris: Secondary | ICD-10-CM | POA: Diagnosis not present

## 2021-03-06 DIAGNOSIS — J9611 Chronic respiratory failure with hypoxia: Secondary | ICD-10-CM | POA: Diagnosis not present

## 2021-03-06 DIAGNOSIS — R634 Abnormal weight loss: Secondary | ICD-10-CM | POA: Diagnosis not present

## 2021-03-10 DIAGNOSIS — R634 Abnormal weight loss: Secondary | ICD-10-CM | POA: Diagnosis not present

## 2021-03-10 DIAGNOSIS — G309 Alzheimer's disease, unspecified: Secondary | ICD-10-CM | POA: Diagnosis not present

## 2021-03-10 DIAGNOSIS — J9611 Chronic respiratory failure with hypoxia: Secondary | ICD-10-CM | POA: Diagnosis not present

## 2021-03-10 DIAGNOSIS — I25119 Atherosclerotic heart disease of native coronary artery with unspecified angina pectoris: Secondary | ICD-10-CM | POA: Diagnosis not present

## 2021-03-10 DIAGNOSIS — I252 Old myocardial infarction: Secondary | ICD-10-CM | POA: Diagnosis not present

## 2021-03-10 DIAGNOSIS — F0280 Dementia in other diseases classified elsewhere without behavioral disturbance: Secondary | ICD-10-CM | POA: Diagnosis not present

## 2021-03-12 DIAGNOSIS — I25119 Atherosclerotic heart disease of native coronary artery with unspecified angina pectoris: Secondary | ICD-10-CM | POA: Diagnosis not present

## 2021-03-12 DIAGNOSIS — I252 Old myocardial infarction: Secondary | ICD-10-CM | POA: Diagnosis not present

## 2021-03-12 DIAGNOSIS — F0280 Dementia in other diseases classified elsewhere without behavioral disturbance: Secondary | ICD-10-CM | POA: Diagnosis not present

## 2021-03-12 DIAGNOSIS — J9611 Chronic respiratory failure with hypoxia: Secondary | ICD-10-CM | POA: Diagnosis not present

## 2021-03-12 DIAGNOSIS — R634 Abnormal weight loss: Secondary | ICD-10-CM | POA: Diagnosis not present

## 2021-03-12 DIAGNOSIS — G309 Alzheimer's disease, unspecified: Secondary | ICD-10-CM | POA: Diagnosis not present

## 2021-03-16 DIAGNOSIS — I25119 Atherosclerotic heart disease of native coronary artery with unspecified angina pectoris: Secondary | ICD-10-CM | POA: Diagnosis not present

## 2021-03-16 DIAGNOSIS — J9611 Chronic respiratory failure with hypoxia: Secondary | ICD-10-CM | POA: Diagnosis not present

## 2021-03-16 DIAGNOSIS — G309 Alzheimer's disease, unspecified: Secondary | ICD-10-CM | POA: Diagnosis not present

## 2021-03-16 DIAGNOSIS — I252 Old myocardial infarction: Secondary | ICD-10-CM | POA: Diagnosis not present

## 2021-03-16 DIAGNOSIS — F0280 Dementia in other diseases classified elsewhere without behavioral disturbance: Secondary | ICD-10-CM | POA: Diagnosis not present

## 2021-03-16 DIAGNOSIS — R634 Abnormal weight loss: Secondary | ICD-10-CM | POA: Diagnosis not present

## 2021-03-17 DIAGNOSIS — J9611 Chronic respiratory failure with hypoxia: Secondary | ICD-10-CM | POA: Diagnosis not present

## 2021-03-17 DIAGNOSIS — R634 Abnormal weight loss: Secondary | ICD-10-CM | POA: Diagnosis not present

## 2021-03-17 DIAGNOSIS — I252 Old myocardial infarction: Secondary | ICD-10-CM | POA: Diagnosis not present

## 2021-03-17 DIAGNOSIS — I25119 Atherosclerotic heart disease of native coronary artery with unspecified angina pectoris: Secondary | ICD-10-CM | POA: Diagnosis not present

## 2021-03-17 DIAGNOSIS — F0280 Dementia in other diseases classified elsewhere without behavioral disturbance: Secondary | ICD-10-CM | POA: Diagnosis not present

## 2021-03-17 DIAGNOSIS — G309 Alzheimer's disease, unspecified: Secondary | ICD-10-CM | POA: Diagnosis not present

## 2021-03-19 DIAGNOSIS — G309 Alzheimer's disease, unspecified: Secondary | ICD-10-CM | POA: Diagnosis not present

## 2021-03-19 DIAGNOSIS — I25119 Atherosclerotic heart disease of native coronary artery with unspecified angina pectoris: Secondary | ICD-10-CM | POA: Diagnosis not present

## 2021-03-19 DIAGNOSIS — J9611 Chronic respiratory failure with hypoxia: Secondary | ICD-10-CM | POA: Diagnosis not present

## 2021-03-19 DIAGNOSIS — R634 Abnormal weight loss: Secondary | ICD-10-CM | POA: Diagnosis not present

## 2021-03-19 DIAGNOSIS — I252 Old myocardial infarction: Secondary | ICD-10-CM | POA: Diagnosis not present

## 2021-03-19 DIAGNOSIS — F0280 Dementia in other diseases classified elsewhere without behavioral disturbance: Secondary | ICD-10-CM | POA: Diagnosis not present

## 2021-03-24 DIAGNOSIS — I252 Old myocardial infarction: Secondary | ICD-10-CM | POA: Diagnosis not present

## 2021-03-24 DIAGNOSIS — I25119 Atherosclerotic heart disease of native coronary artery with unspecified angina pectoris: Secondary | ICD-10-CM | POA: Diagnosis not present

## 2021-03-24 DIAGNOSIS — J9611 Chronic respiratory failure with hypoxia: Secondary | ICD-10-CM | POA: Diagnosis not present

## 2021-03-24 DIAGNOSIS — F0280 Dementia in other diseases classified elsewhere without behavioral disturbance: Secondary | ICD-10-CM | POA: Diagnosis not present

## 2021-03-24 DIAGNOSIS — R634 Abnormal weight loss: Secondary | ICD-10-CM | POA: Diagnosis not present

## 2021-03-24 DIAGNOSIS — G309 Alzheimer's disease, unspecified: Secondary | ICD-10-CM | POA: Diagnosis not present

## 2021-03-26 DIAGNOSIS — I252 Old myocardial infarction: Secondary | ICD-10-CM | POA: Diagnosis not present

## 2021-03-26 DIAGNOSIS — F0280 Dementia in other diseases classified elsewhere without behavioral disturbance: Secondary | ICD-10-CM | POA: Diagnosis not present

## 2021-03-26 DIAGNOSIS — R634 Abnormal weight loss: Secondary | ICD-10-CM | POA: Diagnosis not present

## 2021-03-26 DIAGNOSIS — G309 Alzheimer's disease, unspecified: Secondary | ICD-10-CM | POA: Diagnosis not present

## 2021-03-26 DIAGNOSIS — I25119 Atherosclerotic heart disease of native coronary artery with unspecified angina pectoris: Secondary | ICD-10-CM | POA: Diagnosis not present

## 2021-03-26 DIAGNOSIS — J9611 Chronic respiratory failure with hypoxia: Secondary | ICD-10-CM | POA: Diagnosis not present

## 2021-03-30 DIAGNOSIS — I25119 Atherosclerotic heart disease of native coronary artery with unspecified angina pectoris: Secondary | ICD-10-CM | POA: Diagnosis not present

## 2021-03-30 DIAGNOSIS — J9611 Chronic respiratory failure with hypoxia: Secondary | ICD-10-CM | POA: Diagnosis not present

## 2021-03-30 DIAGNOSIS — G309 Alzheimer's disease, unspecified: Secondary | ICD-10-CM | POA: Diagnosis not present

## 2021-03-30 DIAGNOSIS — R634 Abnormal weight loss: Secondary | ICD-10-CM | POA: Diagnosis not present

## 2021-03-30 DIAGNOSIS — F0280 Dementia in other diseases classified elsewhere without behavioral disturbance: Secondary | ICD-10-CM | POA: Diagnosis not present

## 2021-03-30 DIAGNOSIS — I252 Old myocardial infarction: Secondary | ICD-10-CM | POA: Diagnosis not present

## 2021-03-31 DIAGNOSIS — J9611 Chronic respiratory failure with hypoxia: Secondary | ICD-10-CM | POA: Diagnosis not present

## 2021-03-31 DIAGNOSIS — F0280 Dementia in other diseases classified elsewhere without behavioral disturbance: Secondary | ICD-10-CM | POA: Diagnosis not present

## 2021-03-31 DIAGNOSIS — I25119 Atherosclerotic heart disease of native coronary artery with unspecified angina pectoris: Secondary | ICD-10-CM | POA: Diagnosis not present

## 2021-03-31 DIAGNOSIS — I252 Old myocardial infarction: Secondary | ICD-10-CM | POA: Diagnosis not present

## 2021-03-31 DIAGNOSIS — G309 Alzheimer's disease, unspecified: Secondary | ICD-10-CM | POA: Diagnosis not present

## 2021-03-31 DIAGNOSIS — R634 Abnormal weight loss: Secondary | ICD-10-CM | POA: Diagnosis not present

## 2021-04-02 DIAGNOSIS — I252 Old myocardial infarction: Secondary | ICD-10-CM | POA: Diagnosis not present

## 2021-04-02 DIAGNOSIS — R634 Abnormal weight loss: Secondary | ICD-10-CM | POA: Diagnosis not present

## 2021-04-02 DIAGNOSIS — I25119 Atherosclerotic heart disease of native coronary artery with unspecified angina pectoris: Secondary | ICD-10-CM | POA: Diagnosis not present

## 2021-04-02 DIAGNOSIS — J9611 Chronic respiratory failure with hypoxia: Secondary | ICD-10-CM | POA: Diagnosis not present

## 2021-04-02 DIAGNOSIS — F0280 Dementia in other diseases classified elsewhere without behavioral disturbance: Secondary | ICD-10-CM | POA: Diagnosis not present

## 2021-04-02 DIAGNOSIS — G309 Alzheimer's disease, unspecified: Secondary | ICD-10-CM | POA: Diagnosis not present

## 2021-04-04 DIAGNOSIS — F0280 Dementia in other diseases classified elsewhere without behavioral disturbance: Secondary | ICD-10-CM | POA: Diagnosis not present

## 2021-04-04 DIAGNOSIS — G309 Alzheimer's disease, unspecified: Secondary | ICD-10-CM | POA: Diagnosis not present

## 2021-04-04 DIAGNOSIS — I5030 Unspecified diastolic (congestive) heart failure: Secondary | ICD-10-CM | POA: Diagnosis not present

## 2021-04-04 DIAGNOSIS — J9611 Chronic respiratory failure with hypoxia: Secondary | ICD-10-CM | POA: Diagnosis not present

## 2021-04-04 DIAGNOSIS — I252 Old myocardial infarction: Secondary | ICD-10-CM | POA: Diagnosis not present

## 2021-04-04 DIAGNOSIS — Z8673 Personal history of transient ischemic attack (TIA), and cerebral infarction without residual deficits: Secondary | ICD-10-CM | POA: Diagnosis not present

## 2021-04-04 DIAGNOSIS — K219 Gastro-esophageal reflux disease without esophagitis: Secondary | ICD-10-CM | POA: Diagnosis not present

## 2021-04-04 DIAGNOSIS — J449 Chronic obstructive pulmonary disease, unspecified: Secondary | ICD-10-CM | POA: Diagnosis not present

## 2021-04-04 DIAGNOSIS — R630 Anorexia: Secondary | ICD-10-CM | POA: Diagnosis not present

## 2021-04-04 DIAGNOSIS — D631 Anemia in chronic kidney disease: Secondary | ICD-10-CM | POA: Diagnosis not present

## 2021-04-04 DIAGNOSIS — Z9981 Dependence on supplemental oxygen: Secondary | ICD-10-CM | POA: Diagnosis not present

## 2021-04-04 DIAGNOSIS — J309 Allergic rhinitis, unspecified: Secondary | ICD-10-CM | POA: Diagnosis not present

## 2021-04-04 DIAGNOSIS — G629 Polyneuropathy, unspecified: Secondary | ICD-10-CM | POA: Diagnosis not present

## 2021-04-04 DIAGNOSIS — I130 Hypertensive heart and chronic kidney disease with heart failure and stage 1 through stage 4 chronic kidney disease, or unspecified chronic kidney disease: Secondary | ICD-10-CM | POA: Diagnosis not present

## 2021-04-04 DIAGNOSIS — R634 Abnormal weight loss: Secondary | ICD-10-CM | POA: Diagnosis not present

## 2021-04-04 DIAGNOSIS — R32 Unspecified urinary incontinence: Secondary | ICD-10-CM | POA: Diagnosis not present

## 2021-04-04 DIAGNOSIS — G4700 Insomnia, unspecified: Secondary | ICD-10-CM | POA: Diagnosis not present

## 2021-04-04 DIAGNOSIS — R627 Adult failure to thrive: Secondary | ICD-10-CM | POA: Diagnosis not present

## 2021-04-04 DIAGNOSIS — H409 Unspecified glaucoma: Secondary | ICD-10-CM | POA: Diagnosis not present

## 2021-04-04 DIAGNOSIS — N184 Chronic kidney disease, stage 4 (severe): Secondary | ICD-10-CM | POA: Diagnosis not present

## 2021-04-04 DIAGNOSIS — E039 Hypothyroidism, unspecified: Secondary | ICD-10-CM | POA: Diagnosis not present

## 2021-04-04 DIAGNOSIS — Z681 Body mass index (BMI) 19 or less, adult: Secondary | ICD-10-CM | POA: Diagnosis not present

## 2021-04-04 DIAGNOSIS — I25119 Atherosclerotic heart disease of native coronary artery with unspecified angina pectoris: Secondary | ICD-10-CM | POA: Diagnosis not present

## 2021-04-04 DIAGNOSIS — R159 Full incontinence of feces: Secondary | ICD-10-CM | POA: Diagnosis not present

## 2021-04-04 DIAGNOSIS — E785 Hyperlipidemia, unspecified: Secondary | ICD-10-CM | POA: Diagnosis not present

## 2021-04-06 DIAGNOSIS — J9611 Chronic respiratory failure with hypoxia: Secondary | ICD-10-CM | POA: Diagnosis not present

## 2021-04-06 DIAGNOSIS — I25119 Atherosclerotic heart disease of native coronary artery with unspecified angina pectoris: Secondary | ICD-10-CM | POA: Diagnosis not present

## 2021-04-06 DIAGNOSIS — R634 Abnormal weight loss: Secondary | ICD-10-CM | POA: Diagnosis not present

## 2021-04-06 DIAGNOSIS — F0280 Dementia in other diseases classified elsewhere without behavioral disturbance: Secondary | ICD-10-CM | POA: Diagnosis not present

## 2021-04-06 DIAGNOSIS — G309 Alzheimer's disease, unspecified: Secondary | ICD-10-CM | POA: Diagnosis not present

## 2021-04-06 DIAGNOSIS — I252 Old myocardial infarction: Secondary | ICD-10-CM | POA: Diagnosis not present

## 2021-04-07 DIAGNOSIS — J9611 Chronic respiratory failure with hypoxia: Secondary | ICD-10-CM | POA: Diagnosis not present

## 2021-04-07 DIAGNOSIS — R634 Abnormal weight loss: Secondary | ICD-10-CM | POA: Diagnosis not present

## 2021-04-07 DIAGNOSIS — I252 Old myocardial infarction: Secondary | ICD-10-CM | POA: Diagnosis not present

## 2021-04-07 DIAGNOSIS — F0280 Dementia in other diseases classified elsewhere without behavioral disturbance: Secondary | ICD-10-CM | POA: Diagnosis not present

## 2021-04-07 DIAGNOSIS — I25119 Atherosclerotic heart disease of native coronary artery with unspecified angina pectoris: Secondary | ICD-10-CM | POA: Diagnosis not present

## 2021-04-07 DIAGNOSIS — G309 Alzheimer's disease, unspecified: Secondary | ICD-10-CM | POA: Diagnosis not present

## 2021-04-09 DIAGNOSIS — G309 Alzheimer's disease, unspecified: Secondary | ICD-10-CM | POA: Diagnosis not present

## 2021-04-09 DIAGNOSIS — I252 Old myocardial infarction: Secondary | ICD-10-CM | POA: Diagnosis not present

## 2021-04-09 DIAGNOSIS — I25119 Atherosclerotic heart disease of native coronary artery with unspecified angina pectoris: Secondary | ICD-10-CM | POA: Diagnosis not present

## 2021-04-09 DIAGNOSIS — R634 Abnormal weight loss: Secondary | ICD-10-CM | POA: Diagnosis not present

## 2021-04-09 DIAGNOSIS — J9611 Chronic respiratory failure with hypoxia: Secondary | ICD-10-CM | POA: Diagnosis not present

## 2021-04-09 DIAGNOSIS — F0280 Dementia in other diseases classified elsewhere without behavioral disturbance: Secondary | ICD-10-CM | POA: Diagnosis not present

## 2021-04-13 DIAGNOSIS — I25119 Atherosclerotic heart disease of native coronary artery with unspecified angina pectoris: Secondary | ICD-10-CM | POA: Diagnosis not present

## 2021-04-13 DIAGNOSIS — J9611 Chronic respiratory failure with hypoxia: Secondary | ICD-10-CM | POA: Diagnosis not present

## 2021-04-13 DIAGNOSIS — I252 Old myocardial infarction: Secondary | ICD-10-CM | POA: Diagnosis not present

## 2021-04-13 DIAGNOSIS — G309 Alzheimer's disease, unspecified: Secondary | ICD-10-CM | POA: Diagnosis not present

## 2021-04-13 DIAGNOSIS — F0280 Dementia in other diseases classified elsewhere without behavioral disturbance: Secondary | ICD-10-CM | POA: Diagnosis not present

## 2021-04-13 DIAGNOSIS — R634 Abnormal weight loss: Secondary | ICD-10-CM | POA: Diagnosis not present

## 2021-04-14 DIAGNOSIS — F0280 Dementia in other diseases classified elsewhere without behavioral disturbance: Secondary | ICD-10-CM | POA: Diagnosis not present

## 2021-04-14 DIAGNOSIS — J9611 Chronic respiratory failure with hypoxia: Secondary | ICD-10-CM | POA: Diagnosis not present

## 2021-04-14 DIAGNOSIS — I25119 Atherosclerotic heart disease of native coronary artery with unspecified angina pectoris: Secondary | ICD-10-CM | POA: Diagnosis not present

## 2021-04-14 DIAGNOSIS — I252 Old myocardial infarction: Secondary | ICD-10-CM | POA: Diagnosis not present

## 2021-04-14 DIAGNOSIS — G309 Alzheimer's disease, unspecified: Secondary | ICD-10-CM | POA: Diagnosis not present

## 2021-04-14 DIAGNOSIS — R634 Abnormal weight loss: Secondary | ICD-10-CM | POA: Diagnosis not present

## 2021-04-16 DIAGNOSIS — J9611 Chronic respiratory failure with hypoxia: Secondary | ICD-10-CM | POA: Diagnosis not present

## 2021-04-16 DIAGNOSIS — R634 Abnormal weight loss: Secondary | ICD-10-CM | POA: Diagnosis not present

## 2021-04-16 DIAGNOSIS — G309 Alzheimer's disease, unspecified: Secondary | ICD-10-CM | POA: Diagnosis not present

## 2021-04-16 DIAGNOSIS — I252 Old myocardial infarction: Secondary | ICD-10-CM | POA: Diagnosis not present

## 2021-04-16 DIAGNOSIS — F0280 Dementia in other diseases classified elsewhere without behavioral disturbance: Secondary | ICD-10-CM | POA: Diagnosis not present

## 2021-04-16 DIAGNOSIS — I25119 Atherosclerotic heart disease of native coronary artery with unspecified angina pectoris: Secondary | ICD-10-CM | POA: Diagnosis not present

## 2021-04-17 DIAGNOSIS — R0602 Shortness of breath: Secondary | ICD-10-CM | POA: Diagnosis not present

## 2021-04-17 DIAGNOSIS — I10 Essential (primary) hypertension: Secondary | ICD-10-CM | POA: Diagnosis not present

## 2021-04-17 DIAGNOSIS — R0989 Other specified symptoms and signs involving the circulatory and respiratory systems: Secondary | ICD-10-CM | POA: Diagnosis not present

## 2021-04-17 DIAGNOSIS — R634 Abnormal weight loss: Secondary | ICD-10-CM | POA: Diagnosis not present

## 2021-04-17 DIAGNOSIS — I25119 Atherosclerotic heart disease of native coronary artery with unspecified angina pectoris: Secondary | ICD-10-CM | POA: Diagnosis not present

## 2021-04-17 DIAGNOSIS — J449 Chronic obstructive pulmonary disease, unspecified: Secondary | ICD-10-CM | POA: Diagnosis not present

## 2021-04-17 DIAGNOSIS — K219 Gastro-esophageal reflux disease without esophagitis: Secondary | ICD-10-CM | POA: Diagnosis not present

## 2021-04-17 DIAGNOSIS — F39 Unspecified mood [affective] disorder: Secondary | ICD-10-CM | POA: Diagnosis not present

## 2021-04-17 DIAGNOSIS — J9611 Chronic respiratory failure with hypoxia: Secondary | ICD-10-CM | POA: Diagnosis not present

## 2021-04-17 DIAGNOSIS — I252 Old myocardial infarction: Secondary | ICD-10-CM | POA: Diagnosis not present

## 2021-04-17 DIAGNOSIS — D649 Anemia, unspecified: Secondary | ICD-10-CM | POA: Diagnosis not present

## 2021-04-17 DIAGNOSIS — E039 Hypothyroidism, unspecified: Secondary | ICD-10-CM | POA: Diagnosis not present

## 2021-04-17 DIAGNOSIS — N183 Chronic kidney disease, stage 3 unspecified: Secondary | ICD-10-CM | POA: Diagnosis not present

## 2021-04-17 DIAGNOSIS — G309 Alzheimer's disease, unspecified: Secondary | ICD-10-CM | POA: Diagnosis not present

## 2021-04-17 DIAGNOSIS — E43 Unspecified severe protein-calorie malnutrition: Secondary | ICD-10-CM | POA: Diagnosis not present

## 2021-04-17 DIAGNOSIS — I700 Atherosclerosis of aorta: Secondary | ICD-10-CM | POA: Diagnosis not present

## 2021-04-17 DIAGNOSIS — F0280 Dementia in other diseases classified elsewhere without behavioral disturbance: Secondary | ICD-10-CM | POA: Diagnosis not present

## 2021-04-17 DIAGNOSIS — R051 Acute cough: Secondary | ICD-10-CM | POA: Diagnosis not present

## 2021-04-18 DIAGNOSIS — J9611 Chronic respiratory failure with hypoxia: Secondary | ICD-10-CM | POA: Diagnosis not present

## 2021-04-18 DIAGNOSIS — I25119 Atherosclerotic heart disease of native coronary artery with unspecified angina pectoris: Secondary | ICD-10-CM | POA: Diagnosis not present

## 2021-04-18 DIAGNOSIS — R634 Abnormal weight loss: Secondary | ICD-10-CM | POA: Diagnosis not present

## 2021-04-18 DIAGNOSIS — G309 Alzheimer's disease, unspecified: Secondary | ICD-10-CM | POA: Diagnosis not present

## 2021-04-18 DIAGNOSIS — I252 Old myocardial infarction: Secondary | ICD-10-CM | POA: Diagnosis not present

## 2021-04-18 DIAGNOSIS — F0280 Dementia in other diseases classified elsewhere without behavioral disturbance: Secondary | ICD-10-CM | POA: Diagnosis not present

## 2021-04-19 DIAGNOSIS — R634 Abnormal weight loss: Secondary | ICD-10-CM | POA: Diagnosis not present

## 2021-04-19 DIAGNOSIS — J9611 Chronic respiratory failure with hypoxia: Secondary | ICD-10-CM | POA: Diagnosis not present

## 2021-04-19 DIAGNOSIS — G309 Alzheimer's disease, unspecified: Secondary | ICD-10-CM | POA: Diagnosis not present

## 2021-04-19 DIAGNOSIS — I25119 Atherosclerotic heart disease of native coronary artery with unspecified angina pectoris: Secondary | ICD-10-CM | POA: Diagnosis not present

## 2021-04-19 DIAGNOSIS — F0280 Dementia in other diseases classified elsewhere without behavioral disturbance: Secondary | ICD-10-CM | POA: Diagnosis not present

## 2021-04-19 DIAGNOSIS — I252 Old myocardial infarction: Secondary | ICD-10-CM | POA: Diagnosis not present

## 2021-04-20 DIAGNOSIS — I252 Old myocardial infarction: Secondary | ICD-10-CM | POA: Diagnosis not present

## 2021-04-20 DIAGNOSIS — I25119 Atherosclerotic heart disease of native coronary artery with unspecified angina pectoris: Secondary | ICD-10-CM | POA: Diagnosis not present

## 2021-04-20 DIAGNOSIS — J9611 Chronic respiratory failure with hypoxia: Secondary | ICD-10-CM | POA: Diagnosis not present

## 2021-04-20 DIAGNOSIS — R634 Abnormal weight loss: Secondary | ICD-10-CM | POA: Diagnosis not present

## 2021-04-20 DIAGNOSIS — F0280 Dementia in other diseases classified elsewhere without behavioral disturbance: Secondary | ICD-10-CM | POA: Diagnosis not present

## 2021-04-20 DIAGNOSIS — G309 Alzheimer's disease, unspecified: Secondary | ICD-10-CM | POA: Diagnosis not present

## 2021-04-21 DIAGNOSIS — I25119 Atherosclerotic heart disease of native coronary artery with unspecified angina pectoris: Secondary | ICD-10-CM | POA: Diagnosis not present

## 2021-04-21 DIAGNOSIS — R634 Abnormal weight loss: Secondary | ICD-10-CM | POA: Diagnosis not present

## 2021-04-21 DIAGNOSIS — F0280 Dementia in other diseases classified elsewhere without behavioral disturbance: Secondary | ICD-10-CM | POA: Diagnosis not present

## 2021-04-21 DIAGNOSIS — G309 Alzheimer's disease, unspecified: Secondary | ICD-10-CM | POA: Diagnosis not present

## 2021-04-21 DIAGNOSIS — J9611 Chronic respiratory failure with hypoxia: Secondary | ICD-10-CM | POA: Diagnosis not present

## 2021-04-21 DIAGNOSIS — I252 Old myocardial infarction: Secondary | ICD-10-CM | POA: Diagnosis not present

## 2021-04-22 DIAGNOSIS — J9611 Chronic respiratory failure with hypoxia: Secondary | ICD-10-CM | POA: Diagnosis not present

## 2021-04-22 DIAGNOSIS — F0280 Dementia in other diseases classified elsewhere without behavioral disturbance: Secondary | ICD-10-CM | POA: Diagnosis not present

## 2021-04-22 DIAGNOSIS — G309 Alzheimer's disease, unspecified: Secondary | ICD-10-CM | POA: Diagnosis not present

## 2021-04-22 DIAGNOSIS — I252 Old myocardial infarction: Secondary | ICD-10-CM | POA: Diagnosis not present

## 2021-04-22 DIAGNOSIS — I25119 Atherosclerotic heart disease of native coronary artery with unspecified angina pectoris: Secondary | ICD-10-CM | POA: Diagnosis not present

## 2021-04-22 DIAGNOSIS — R634 Abnormal weight loss: Secondary | ICD-10-CM | POA: Diagnosis not present

## 2021-04-23 DIAGNOSIS — I252 Old myocardial infarction: Secondary | ICD-10-CM | POA: Diagnosis not present

## 2021-04-23 DIAGNOSIS — J9611 Chronic respiratory failure with hypoxia: Secondary | ICD-10-CM | POA: Diagnosis not present

## 2021-04-23 DIAGNOSIS — I25119 Atherosclerotic heart disease of native coronary artery with unspecified angina pectoris: Secondary | ICD-10-CM | POA: Diagnosis not present

## 2021-04-23 DIAGNOSIS — R634 Abnormal weight loss: Secondary | ICD-10-CM | POA: Diagnosis not present

## 2021-04-23 DIAGNOSIS — F0280 Dementia in other diseases classified elsewhere without behavioral disturbance: Secondary | ICD-10-CM | POA: Diagnosis not present

## 2021-04-23 DIAGNOSIS — G309 Alzheimer's disease, unspecified: Secondary | ICD-10-CM | POA: Diagnosis not present

## 2021-04-24 DIAGNOSIS — G309 Alzheimer's disease, unspecified: Secondary | ICD-10-CM | POA: Diagnosis not present

## 2021-04-24 DIAGNOSIS — R634 Abnormal weight loss: Secondary | ICD-10-CM | POA: Diagnosis not present

## 2021-04-24 DIAGNOSIS — J9611 Chronic respiratory failure with hypoxia: Secondary | ICD-10-CM | POA: Diagnosis not present

## 2021-04-24 DIAGNOSIS — I252 Old myocardial infarction: Secondary | ICD-10-CM | POA: Diagnosis not present

## 2021-04-24 DIAGNOSIS — I25119 Atherosclerotic heart disease of native coronary artery with unspecified angina pectoris: Secondary | ICD-10-CM | POA: Diagnosis not present

## 2021-04-24 DIAGNOSIS — F0280 Dementia in other diseases classified elsewhere without behavioral disturbance: Secondary | ICD-10-CM | POA: Diagnosis not present

## 2021-04-28 DIAGNOSIS — R634 Abnormal weight loss: Secondary | ICD-10-CM | POA: Diagnosis not present

## 2021-04-28 DIAGNOSIS — I252 Old myocardial infarction: Secondary | ICD-10-CM | POA: Diagnosis not present

## 2021-04-28 DIAGNOSIS — I25119 Atherosclerotic heart disease of native coronary artery with unspecified angina pectoris: Secondary | ICD-10-CM | POA: Diagnosis not present

## 2021-04-28 DIAGNOSIS — J9611 Chronic respiratory failure with hypoxia: Secondary | ICD-10-CM | POA: Diagnosis not present

## 2021-04-28 DIAGNOSIS — G309 Alzheimer's disease, unspecified: Secondary | ICD-10-CM | POA: Diagnosis not present

## 2021-04-28 DIAGNOSIS — F0280 Dementia in other diseases classified elsewhere without behavioral disturbance: Secondary | ICD-10-CM | POA: Diagnosis not present

## 2021-04-30 DIAGNOSIS — J9611 Chronic respiratory failure with hypoxia: Secondary | ICD-10-CM | POA: Diagnosis not present

## 2021-04-30 DIAGNOSIS — F0280 Dementia in other diseases classified elsewhere without behavioral disturbance: Secondary | ICD-10-CM | POA: Diagnosis not present

## 2021-04-30 DIAGNOSIS — R634 Abnormal weight loss: Secondary | ICD-10-CM | POA: Diagnosis not present

## 2021-04-30 DIAGNOSIS — I252 Old myocardial infarction: Secondary | ICD-10-CM | POA: Diagnosis not present

## 2021-04-30 DIAGNOSIS — G309 Alzheimer's disease, unspecified: Secondary | ICD-10-CM | POA: Diagnosis not present

## 2021-04-30 DIAGNOSIS — I25119 Atherosclerotic heart disease of native coronary artery with unspecified angina pectoris: Secondary | ICD-10-CM | POA: Diagnosis not present

## 2021-05-01 DIAGNOSIS — G309 Alzheimer's disease, unspecified: Secondary | ICD-10-CM | POA: Diagnosis not present

## 2021-05-01 DIAGNOSIS — I25119 Atherosclerotic heart disease of native coronary artery with unspecified angina pectoris: Secondary | ICD-10-CM | POA: Diagnosis not present

## 2021-05-01 DIAGNOSIS — F0280 Dementia in other diseases classified elsewhere without behavioral disturbance: Secondary | ICD-10-CM | POA: Diagnosis not present

## 2021-05-01 DIAGNOSIS — I252 Old myocardial infarction: Secondary | ICD-10-CM | POA: Diagnosis not present

## 2021-05-01 DIAGNOSIS — R634 Abnormal weight loss: Secondary | ICD-10-CM | POA: Diagnosis not present

## 2021-05-01 DIAGNOSIS — J9611 Chronic respiratory failure with hypoxia: Secondary | ICD-10-CM | POA: Diagnosis not present

## 2021-05-04 DIAGNOSIS — G309 Alzheimer's disease, unspecified: Secondary | ICD-10-CM | POA: Diagnosis not present

## 2021-05-04 DIAGNOSIS — I252 Old myocardial infarction: Secondary | ICD-10-CM | POA: Diagnosis not present

## 2021-05-04 DIAGNOSIS — K219 Gastro-esophageal reflux disease without esophagitis: Secondary | ICD-10-CM | POA: Diagnosis not present

## 2021-05-04 DIAGNOSIS — R32 Unspecified urinary incontinence: Secondary | ICD-10-CM | POA: Diagnosis not present

## 2021-05-04 DIAGNOSIS — Z9981 Dependence on supplemental oxygen: Secondary | ICD-10-CM | POA: Diagnosis not present

## 2021-05-04 DIAGNOSIS — G4700 Insomnia, unspecified: Secondary | ICD-10-CM | POA: Diagnosis not present

## 2021-05-04 DIAGNOSIS — R627 Adult failure to thrive: Secondary | ICD-10-CM | POA: Diagnosis not present

## 2021-05-04 DIAGNOSIS — I25119 Atherosclerotic heart disease of native coronary artery with unspecified angina pectoris: Secondary | ICD-10-CM | POA: Diagnosis not present

## 2021-05-04 DIAGNOSIS — J449 Chronic obstructive pulmonary disease, unspecified: Secondary | ICD-10-CM | POA: Diagnosis not present

## 2021-05-04 DIAGNOSIS — D631 Anemia in chronic kidney disease: Secondary | ICD-10-CM | POA: Diagnosis not present

## 2021-05-04 DIAGNOSIS — R159 Full incontinence of feces: Secondary | ICD-10-CM | POA: Diagnosis not present

## 2021-05-04 DIAGNOSIS — E039 Hypothyroidism, unspecified: Secondary | ICD-10-CM | POA: Diagnosis not present

## 2021-05-04 DIAGNOSIS — G629 Polyneuropathy, unspecified: Secondary | ICD-10-CM | POA: Diagnosis not present

## 2021-05-04 DIAGNOSIS — I5030 Unspecified diastolic (congestive) heart failure: Secondary | ICD-10-CM | POA: Diagnosis not present

## 2021-05-04 DIAGNOSIS — R634 Abnormal weight loss: Secondary | ICD-10-CM | POA: Diagnosis not present

## 2021-05-04 DIAGNOSIS — Z8673 Personal history of transient ischemic attack (TIA), and cerebral infarction without residual deficits: Secondary | ICD-10-CM | POA: Diagnosis not present

## 2021-05-04 DIAGNOSIS — I130 Hypertensive heart and chronic kidney disease with heart failure and stage 1 through stage 4 chronic kidney disease, or unspecified chronic kidney disease: Secondary | ICD-10-CM | POA: Diagnosis not present

## 2021-05-04 DIAGNOSIS — R630 Anorexia: Secondary | ICD-10-CM | POA: Diagnosis not present

## 2021-05-04 DIAGNOSIS — E785 Hyperlipidemia, unspecified: Secondary | ICD-10-CM | POA: Diagnosis not present

## 2021-05-04 DIAGNOSIS — N184 Chronic kidney disease, stage 4 (severe): Secondary | ICD-10-CM | POA: Diagnosis not present

## 2021-05-04 DIAGNOSIS — F0280 Dementia in other diseases classified elsewhere without behavioral disturbance: Secondary | ICD-10-CM | POA: Diagnosis not present

## 2021-05-04 DIAGNOSIS — H409 Unspecified glaucoma: Secondary | ICD-10-CM | POA: Diagnosis not present

## 2021-05-04 DIAGNOSIS — J9611 Chronic respiratory failure with hypoxia: Secondary | ICD-10-CM | POA: Diagnosis not present

## 2021-05-04 DIAGNOSIS — J309 Allergic rhinitis, unspecified: Secondary | ICD-10-CM | POA: Diagnosis not present

## 2021-05-04 DIAGNOSIS — Z681 Body mass index (BMI) 19 or less, adult: Secondary | ICD-10-CM | POA: Diagnosis not present

## 2021-05-05 DIAGNOSIS — I252 Old myocardial infarction: Secondary | ICD-10-CM | POA: Diagnosis not present

## 2021-05-05 DIAGNOSIS — F0280 Dementia in other diseases classified elsewhere without behavioral disturbance: Secondary | ICD-10-CM | POA: Diagnosis not present

## 2021-05-05 DIAGNOSIS — R634 Abnormal weight loss: Secondary | ICD-10-CM | POA: Diagnosis not present

## 2021-05-05 DIAGNOSIS — G309 Alzheimer's disease, unspecified: Secondary | ICD-10-CM | POA: Diagnosis not present

## 2021-05-05 DIAGNOSIS — I25119 Atherosclerotic heart disease of native coronary artery with unspecified angina pectoris: Secondary | ICD-10-CM | POA: Diagnosis not present

## 2021-05-05 DIAGNOSIS — J9611 Chronic respiratory failure with hypoxia: Secondary | ICD-10-CM | POA: Diagnosis not present

## 2021-05-06 DIAGNOSIS — J9611 Chronic respiratory failure with hypoxia: Secondary | ICD-10-CM | POA: Diagnosis not present

## 2021-05-06 DIAGNOSIS — R634 Abnormal weight loss: Secondary | ICD-10-CM | POA: Diagnosis not present

## 2021-05-06 DIAGNOSIS — I252 Old myocardial infarction: Secondary | ICD-10-CM | POA: Diagnosis not present

## 2021-05-06 DIAGNOSIS — F0280 Dementia in other diseases classified elsewhere without behavioral disturbance: Secondary | ICD-10-CM | POA: Diagnosis not present

## 2021-05-06 DIAGNOSIS — I25119 Atherosclerotic heart disease of native coronary artery with unspecified angina pectoris: Secondary | ICD-10-CM | POA: Diagnosis not present

## 2021-05-06 DIAGNOSIS — G309 Alzheimer's disease, unspecified: Secondary | ICD-10-CM | POA: Diagnosis not present

## 2021-05-07 DIAGNOSIS — I25119 Atherosclerotic heart disease of native coronary artery with unspecified angina pectoris: Secondary | ICD-10-CM | POA: Diagnosis not present

## 2021-05-07 DIAGNOSIS — I252 Old myocardial infarction: Secondary | ICD-10-CM | POA: Diagnosis not present

## 2021-05-07 DIAGNOSIS — J9611 Chronic respiratory failure with hypoxia: Secondary | ICD-10-CM | POA: Diagnosis not present

## 2021-05-07 DIAGNOSIS — G309 Alzheimer's disease, unspecified: Secondary | ICD-10-CM | POA: Diagnosis not present

## 2021-05-07 DIAGNOSIS — F0280 Dementia in other diseases classified elsewhere without behavioral disturbance: Secondary | ICD-10-CM | POA: Diagnosis not present

## 2021-05-07 DIAGNOSIS — R634 Abnormal weight loss: Secondary | ICD-10-CM | POA: Diagnosis not present

## 2021-05-11 DIAGNOSIS — I252 Old myocardial infarction: Secondary | ICD-10-CM | POA: Diagnosis not present

## 2021-05-11 DIAGNOSIS — J9611 Chronic respiratory failure with hypoxia: Secondary | ICD-10-CM | POA: Diagnosis not present

## 2021-05-11 DIAGNOSIS — F0280 Dementia in other diseases classified elsewhere without behavioral disturbance: Secondary | ICD-10-CM | POA: Diagnosis not present

## 2021-05-11 DIAGNOSIS — G309 Alzheimer's disease, unspecified: Secondary | ICD-10-CM | POA: Diagnosis not present

## 2021-05-11 DIAGNOSIS — I25119 Atherosclerotic heart disease of native coronary artery with unspecified angina pectoris: Secondary | ICD-10-CM | POA: Diagnosis not present

## 2021-05-11 DIAGNOSIS — R634 Abnormal weight loss: Secondary | ICD-10-CM | POA: Diagnosis not present

## 2021-05-12 DIAGNOSIS — I252 Old myocardial infarction: Secondary | ICD-10-CM | POA: Diagnosis not present

## 2021-05-12 DIAGNOSIS — F0280 Dementia in other diseases classified elsewhere without behavioral disturbance: Secondary | ICD-10-CM | POA: Diagnosis not present

## 2021-05-12 DIAGNOSIS — J9611 Chronic respiratory failure with hypoxia: Secondary | ICD-10-CM | POA: Diagnosis not present

## 2021-05-12 DIAGNOSIS — R634 Abnormal weight loss: Secondary | ICD-10-CM | POA: Diagnosis not present

## 2021-05-12 DIAGNOSIS — G309 Alzheimer's disease, unspecified: Secondary | ICD-10-CM | POA: Diagnosis not present

## 2021-05-12 DIAGNOSIS — I25119 Atherosclerotic heart disease of native coronary artery with unspecified angina pectoris: Secondary | ICD-10-CM | POA: Diagnosis not present

## 2021-05-12 DIAGNOSIS — M2042 Other hammer toe(s) (acquired), left foot: Secondary | ICD-10-CM | POA: Diagnosis not present

## 2021-05-12 DIAGNOSIS — M2041 Other hammer toe(s) (acquired), right foot: Secondary | ICD-10-CM | POA: Diagnosis not present

## 2021-05-12 DIAGNOSIS — L603 Nail dystrophy: Secondary | ICD-10-CM | POA: Diagnosis not present

## 2021-05-13 DIAGNOSIS — I252 Old myocardial infarction: Secondary | ICD-10-CM | POA: Diagnosis not present

## 2021-05-13 DIAGNOSIS — J9611 Chronic respiratory failure with hypoxia: Secondary | ICD-10-CM | POA: Diagnosis not present

## 2021-05-13 DIAGNOSIS — R634 Abnormal weight loss: Secondary | ICD-10-CM | POA: Diagnosis not present

## 2021-05-13 DIAGNOSIS — I25119 Atherosclerotic heart disease of native coronary artery with unspecified angina pectoris: Secondary | ICD-10-CM | POA: Diagnosis not present

## 2021-05-13 DIAGNOSIS — F0280 Dementia in other diseases classified elsewhere without behavioral disturbance: Secondary | ICD-10-CM | POA: Diagnosis not present

## 2021-05-13 DIAGNOSIS — G309 Alzheimer's disease, unspecified: Secondary | ICD-10-CM | POA: Diagnosis not present

## 2021-05-14 DIAGNOSIS — F0280 Dementia in other diseases classified elsewhere without behavioral disturbance: Secondary | ICD-10-CM | POA: Diagnosis not present

## 2021-05-14 DIAGNOSIS — G309 Alzheimer's disease, unspecified: Secondary | ICD-10-CM | POA: Diagnosis not present

## 2021-05-14 DIAGNOSIS — I252 Old myocardial infarction: Secondary | ICD-10-CM | POA: Diagnosis not present

## 2021-05-14 DIAGNOSIS — I25119 Atherosclerotic heart disease of native coronary artery with unspecified angina pectoris: Secondary | ICD-10-CM | POA: Diagnosis not present

## 2021-05-14 DIAGNOSIS — J9611 Chronic respiratory failure with hypoxia: Secondary | ICD-10-CM | POA: Diagnosis not present

## 2021-05-14 DIAGNOSIS — R634 Abnormal weight loss: Secondary | ICD-10-CM | POA: Diagnosis not present

## 2021-05-15 DIAGNOSIS — I25119 Atherosclerotic heart disease of native coronary artery with unspecified angina pectoris: Secondary | ICD-10-CM | POA: Diagnosis not present

## 2021-05-15 DIAGNOSIS — J9611 Chronic respiratory failure with hypoxia: Secondary | ICD-10-CM | POA: Diagnosis not present

## 2021-05-15 DIAGNOSIS — G309 Alzheimer's disease, unspecified: Secondary | ICD-10-CM | POA: Diagnosis not present

## 2021-05-15 DIAGNOSIS — I252 Old myocardial infarction: Secondary | ICD-10-CM | POA: Diagnosis not present

## 2021-05-15 DIAGNOSIS — F0280 Dementia in other diseases classified elsewhere without behavioral disturbance: Secondary | ICD-10-CM | POA: Diagnosis not present

## 2021-05-15 DIAGNOSIS — R634 Abnormal weight loss: Secondary | ICD-10-CM | POA: Diagnosis not present

## 2021-05-18 DIAGNOSIS — J9611 Chronic respiratory failure with hypoxia: Secondary | ICD-10-CM | POA: Diagnosis not present

## 2021-05-18 DIAGNOSIS — I25119 Atherosclerotic heart disease of native coronary artery with unspecified angina pectoris: Secondary | ICD-10-CM | POA: Diagnosis not present

## 2021-05-18 DIAGNOSIS — F0280 Dementia in other diseases classified elsewhere without behavioral disturbance: Secondary | ICD-10-CM | POA: Diagnosis not present

## 2021-05-18 DIAGNOSIS — G309 Alzheimer's disease, unspecified: Secondary | ICD-10-CM | POA: Diagnosis not present

## 2021-05-18 DIAGNOSIS — R634 Abnormal weight loss: Secondary | ICD-10-CM | POA: Diagnosis not present

## 2021-05-18 DIAGNOSIS — I252 Old myocardial infarction: Secondary | ICD-10-CM | POA: Diagnosis not present

## 2021-05-19 DIAGNOSIS — I25119 Atherosclerotic heart disease of native coronary artery with unspecified angina pectoris: Secondary | ICD-10-CM | POA: Diagnosis not present

## 2021-05-19 DIAGNOSIS — G309 Alzheimer's disease, unspecified: Secondary | ICD-10-CM | POA: Diagnosis not present

## 2021-05-19 DIAGNOSIS — F0280 Dementia in other diseases classified elsewhere without behavioral disturbance: Secondary | ICD-10-CM | POA: Diagnosis not present

## 2021-05-19 DIAGNOSIS — J9611 Chronic respiratory failure with hypoxia: Secondary | ICD-10-CM | POA: Diagnosis not present

## 2021-05-19 DIAGNOSIS — R634 Abnormal weight loss: Secondary | ICD-10-CM | POA: Diagnosis not present

## 2021-05-19 DIAGNOSIS — I252 Old myocardial infarction: Secondary | ICD-10-CM | POA: Diagnosis not present

## 2021-05-20 DIAGNOSIS — J9611 Chronic respiratory failure with hypoxia: Secondary | ICD-10-CM | POA: Diagnosis not present

## 2021-05-20 DIAGNOSIS — I252 Old myocardial infarction: Secondary | ICD-10-CM | POA: Diagnosis not present

## 2021-05-20 DIAGNOSIS — G309 Alzheimer's disease, unspecified: Secondary | ICD-10-CM | POA: Diagnosis not present

## 2021-05-20 DIAGNOSIS — F0280 Dementia in other diseases classified elsewhere without behavioral disturbance: Secondary | ICD-10-CM | POA: Diagnosis not present

## 2021-05-20 DIAGNOSIS — R634 Abnormal weight loss: Secondary | ICD-10-CM | POA: Diagnosis not present

## 2021-05-20 DIAGNOSIS — I25119 Atherosclerotic heart disease of native coronary artery with unspecified angina pectoris: Secondary | ICD-10-CM | POA: Diagnosis not present

## 2021-05-21 DIAGNOSIS — J9611 Chronic respiratory failure with hypoxia: Secondary | ICD-10-CM | POA: Diagnosis not present

## 2021-05-21 DIAGNOSIS — R634 Abnormal weight loss: Secondary | ICD-10-CM | POA: Diagnosis not present

## 2021-05-21 DIAGNOSIS — I252 Old myocardial infarction: Secondary | ICD-10-CM | POA: Diagnosis not present

## 2021-05-21 DIAGNOSIS — G309 Alzheimer's disease, unspecified: Secondary | ICD-10-CM | POA: Diagnosis not present

## 2021-05-21 DIAGNOSIS — I25119 Atherosclerotic heart disease of native coronary artery with unspecified angina pectoris: Secondary | ICD-10-CM | POA: Diagnosis not present

## 2021-05-21 DIAGNOSIS — F0280 Dementia in other diseases classified elsewhere without behavioral disturbance: Secondary | ICD-10-CM | POA: Diagnosis not present

## 2021-05-25 DIAGNOSIS — R634 Abnormal weight loss: Secondary | ICD-10-CM | POA: Diagnosis not present

## 2021-05-25 DIAGNOSIS — F0280 Dementia in other diseases classified elsewhere without behavioral disturbance: Secondary | ICD-10-CM | POA: Diagnosis not present

## 2021-05-25 DIAGNOSIS — I25119 Atherosclerotic heart disease of native coronary artery with unspecified angina pectoris: Secondary | ICD-10-CM | POA: Diagnosis not present

## 2021-05-25 DIAGNOSIS — J9611 Chronic respiratory failure with hypoxia: Secondary | ICD-10-CM | POA: Diagnosis not present

## 2021-05-25 DIAGNOSIS — G309 Alzheimer's disease, unspecified: Secondary | ICD-10-CM | POA: Diagnosis not present

## 2021-05-25 DIAGNOSIS — I252 Old myocardial infarction: Secondary | ICD-10-CM | POA: Diagnosis not present

## 2021-05-26 DIAGNOSIS — I252 Old myocardial infarction: Secondary | ICD-10-CM | POA: Diagnosis not present

## 2021-05-26 DIAGNOSIS — G309 Alzheimer's disease, unspecified: Secondary | ICD-10-CM | POA: Diagnosis not present

## 2021-05-26 DIAGNOSIS — R634 Abnormal weight loss: Secondary | ICD-10-CM | POA: Diagnosis not present

## 2021-05-26 DIAGNOSIS — F0280 Dementia in other diseases classified elsewhere without behavioral disturbance: Secondary | ICD-10-CM | POA: Diagnosis not present

## 2021-05-26 DIAGNOSIS — I25119 Atherosclerotic heart disease of native coronary artery with unspecified angina pectoris: Secondary | ICD-10-CM | POA: Diagnosis not present

## 2021-05-26 DIAGNOSIS — J9611 Chronic respiratory failure with hypoxia: Secondary | ICD-10-CM | POA: Diagnosis not present

## 2021-05-27 DIAGNOSIS — R634 Abnormal weight loss: Secondary | ICD-10-CM | POA: Diagnosis not present

## 2021-05-27 DIAGNOSIS — F0280 Dementia in other diseases classified elsewhere without behavioral disturbance: Secondary | ICD-10-CM | POA: Diagnosis not present

## 2021-05-27 DIAGNOSIS — I252 Old myocardial infarction: Secondary | ICD-10-CM | POA: Diagnosis not present

## 2021-05-27 DIAGNOSIS — G309 Alzheimer's disease, unspecified: Secondary | ICD-10-CM | POA: Diagnosis not present

## 2021-05-27 DIAGNOSIS — J9611 Chronic respiratory failure with hypoxia: Secondary | ICD-10-CM | POA: Diagnosis not present

## 2021-05-27 DIAGNOSIS — I25119 Atherosclerotic heart disease of native coronary artery with unspecified angina pectoris: Secondary | ICD-10-CM | POA: Diagnosis not present

## 2021-05-28 DIAGNOSIS — I25119 Atherosclerotic heart disease of native coronary artery with unspecified angina pectoris: Secondary | ICD-10-CM | POA: Diagnosis not present

## 2021-05-28 DIAGNOSIS — I252 Old myocardial infarction: Secondary | ICD-10-CM | POA: Diagnosis not present

## 2021-05-28 DIAGNOSIS — F0280 Dementia in other diseases classified elsewhere without behavioral disturbance: Secondary | ICD-10-CM | POA: Diagnosis not present

## 2021-05-28 DIAGNOSIS — R634 Abnormal weight loss: Secondary | ICD-10-CM | POA: Diagnosis not present

## 2021-05-28 DIAGNOSIS — J9611 Chronic respiratory failure with hypoxia: Secondary | ICD-10-CM | POA: Diagnosis not present

## 2021-05-28 DIAGNOSIS — G309 Alzheimer's disease, unspecified: Secondary | ICD-10-CM | POA: Diagnosis not present

## 2021-06-02 DIAGNOSIS — G309 Alzheimer's disease, unspecified: Secondary | ICD-10-CM | POA: Diagnosis not present

## 2021-06-02 DIAGNOSIS — I252 Old myocardial infarction: Secondary | ICD-10-CM | POA: Diagnosis not present

## 2021-06-02 DIAGNOSIS — J9611 Chronic respiratory failure with hypoxia: Secondary | ICD-10-CM | POA: Diagnosis not present

## 2021-06-02 DIAGNOSIS — I25119 Atherosclerotic heart disease of native coronary artery with unspecified angina pectoris: Secondary | ICD-10-CM | POA: Diagnosis not present

## 2021-06-02 DIAGNOSIS — R634 Abnormal weight loss: Secondary | ICD-10-CM | POA: Diagnosis not present

## 2021-06-02 DIAGNOSIS — F0280 Dementia in other diseases classified elsewhere without behavioral disturbance: Secondary | ICD-10-CM | POA: Diagnosis not present

## 2021-06-04 DIAGNOSIS — G309 Alzheimer's disease, unspecified: Secondary | ICD-10-CM | POA: Diagnosis not present

## 2021-06-04 DIAGNOSIS — I130 Hypertensive heart and chronic kidney disease with heart failure and stage 1 through stage 4 chronic kidney disease, or unspecified chronic kidney disease: Secondary | ICD-10-CM | POA: Diagnosis not present

## 2021-06-04 DIAGNOSIS — J9611 Chronic respiratory failure with hypoxia: Secondary | ICD-10-CM | POA: Diagnosis not present

## 2021-06-04 DIAGNOSIS — I252 Old myocardial infarction: Secondary | ICD-10-CM | POA: Diagnosis not present

## 2021-06-04 DIAGNOSIS — R634 Abnormal weight loss: Secondary | ICD-10-CM | POA: Diagnosis not present

## 2021-06-04 DIAGNOSIS — I5030 Unspecified diastolic (congestive) heart failure: Secondary | ICD-10-CM | POA: Diagnosis not present

## 2021-06-04 DIAGNOSIS — D631 Anemia in chronic kidney disease: Secondary | ICD-10-CM | POA: Diagnosis not present

## 2021-06-04 DIAGNOSIS — E039 Hypothyroidism, unspecified: Secondary | ICD-10-CM | POA: Diagnosis not present

## 2021-06-04 DIAGNOSIS — R32 Unspecified urinary incontinence: Secondary | ICD-10-CM | POA: Diagnosis not present

## 2021-06-04 DIAGNOSIS — Z681 Body mass index (BMI) 19 or less, adult: Secondary | ICD-10-CM | POA: Diagnosis not present

## 2021-06-04 DIAGNOSIS — E785 Hyperlipidemia, unspecified: Secondary | ICD-10-CM | POA: Diagnosis not present

## 2021-06-04 DIAGNOSIS — G4700 Insomnia, unspecified: Secondary | ICD-10-CM | POA: Diagnosis not present

## 2021-06-04 DIAGNOSIS — F0280 Dementia in other diseases classified elsewhere without behavioral disturbance: Secondary | ICD-10-CM | POA: Diagnosis not present

## 2021-06-04 DIAGNOSIS — H409 Unspecified glaucoma: Secondary | ICD-10-CM | POA: Diagnosis not present

## 2021-06-04 DIAGNOSIS — Z8673 Personal history of transient ischemic attack (TIA), and cerebral infarction without residual deficits: Secondary | ICD-10-CM | POA: Diagnosis not present

## 2021-06-04 DIAGNOSIS — R627 Adult failure to thrive: Secondary | ICD-10-CM | POA: Diagnosis not present

## 2021-06-04 DIAGNOSIS — J449 Chronic obstructive pulmonary disease, unspecified: Secondary | ICD-10-CM | POA: Diagnosis not present

## 2021-06-04 DIAGNOSIS — N184 Chronic kidney disease, stage 4 (severe): Secondary | ICD-10-CM | POA: Diagnosis not present

## 2021-06-04 DIAGNOSIS — Z9981 Dependence on supplemental oxygen: Secondary | ICD-10-CM | POA: Diagnosis not present

## 2021-06-04 DIAGNOSIS — K219 Gastro-esophageal reflux disease without esophagitis: Secondary | ICD-10-CM | POA: Diagnosis not present

## 2021-06-04 DIAGNOSIS — R630 Anorexia: Secondary | ICD-10-CM | POA: Diagnosis not present

## 2021-06-04 DIAGNOSIS — J309 Allergic rhinitis, unspecified: Secondary | ICD-10-CM | POA: Diagnosis not present

## 2021-06-04 DIAGNOSIS — R159 Full incontinence of feces: Secondary | ICD-10-CM | POA: Diagnosis not present

## 2021-06-04 DIAGNOSIS — G629 Polyneuropathy, unspecified: Secondary | ICD-10-CM | POA: Diagnosis not present

## 2021-06-04 DIAGNOSIS — I25119 Atherosclerotic heart disease of native coronary artery with unspecified angina pectoris: Secondary | ICD-10-CM | POA: Diagnosis not present

## 2021-06-05 DIAGNOSIS — I252 Old myocardial infarction: Secondary | ICD-10-CM | POA: Diagnosis not present

## 2021-06-05 DIAGNOSIS — F0280 Dementia in other diseases classified elsewhere without behavioral disturbance: Secondary | ICD-10-CM | POA: Diagnosis not present

## 2021-06-05 DIAGNOSIS — J9611 Chronic respiratory failure with hypoxia: Secondary | ICD-10-CM | POA: Diagnosis not present

## 2021-06-05 DIAGNOSIS — I25119 Atherosclerotic heart disease of native coronary artery with unspecified angina pectoris: Secondary | ICD-10-CM | POA: Diagnosis not present

## 2021-06-05 DIAGNOSIS — G309 Alzheimer's disease, unspecified: Secondary | ICD-10-CM | POA: Diagnosis not present

## 2021-06-05 DIAGNOSIS — R634 Abnormal weight loss: Secondary | ICD-10-CM | POA: Diagnosis not present

## 2021-06-08 DIAGNOSIS — I25119 Atherosclerotic heart disease of native coronary artery with unspecified angina pectoris: Secondary | ICD-10-CM | POA: Diagnosis not present

## 2021-06-08 DIAGNOSIS — F0280 Dementia in other diseases classified elsewhere without behavioral disturbance: Secondary | ICD-10-CM | POA: Diagnosis not present

## 2021-06-08 DIAGNOSIS — J9611 Chronic respiratory failure with hypoxia: Secondary | ICD-10-CM | POA: Diagnosis not present

## 2021-06-08 DIAGNOSIS — I252 Old myocardial infarction: Secondary | ICD-10-CM | POA: Diagnosis not present

## 2021-06-08 DIAGNOSIS — R634 Abnormal weight loss: Secondary | ICD-10-CM | POA: Diagnosis not present

## 2021-06-08 DIAGNOSIS — G309 Alzheimer's disease, unspecified: Secondary | ICD-10-CM | POA: Diagnosis not present

## 2021-06-09 DIAGNOSIS — I252 Old myocardial infarction: Secondary | ICD-10-CM | POA: Diagnosis not present

## 2021-06-09 DIAGNOSIS — J9611 Chronic respiratory failure with hypoxia: Secondary | ICD-10-CM | POA: Diagnosis not present

## 2021-06-09 DIAGNOSIS — I25119 Atherosclerotic heart disease of native coronary artery with unspecified angina pectoris: Secondary | ICD-10-CM | POA: Diagnosis not present

## 2021-06-09 DIAGNOSIS — F0280 Dementia in other diseases classified elsewhere without behavioral disturbance: Secondary | ICD-10-CM | POA: Diagnosis not present

## 2021-06-09 DIAGNOSIS — R634 Abnormal weight loss: Secondary | ICD-10-CM | POA: Diagnosis not present

## 2021-06-09 DIAGNOSIS — G309 Alzheimer's disease, unspecified: Secondary | ICD-10-CM | POA: Diagnosis not present

## 2021-06-11 DIAGNOSIS — I25119 Atherosclerotic heart disease of native coronary artery with unspecified angina pectoris: Secondary | ICD-10-CM | POA: Diagnosis not present

## 2021-06-11 DIAGNOSIS — G309 Alzheimer's disease, unspecified: Secondary | ICD-10-CM | POA: Diagnosis not present

## 2021-06-11 DIAGNOSIS — J9611 Chronic respiratory failure with hypoxia: Secondary | ICD-10-CM | POA: Diagnosis not present

## 2021-06-11 DIAGNOSIS — F0280 Dementia in other diseases classified elsewhere without behavioral disturbance: Secondary | ICD-10-CM | POA: Diagnosis not present

## 2021-06-11 DIAGNOSIS — R634 Abnormal weight loss: Secondary | ICD-10-CM | POA: Diagnosis not present

## 2021-06-11 DIAGNOSIS — I252 Old myocardial infarction: Secondary | ICD-10-CM | POA: Diagnosis not present

## 2021-06-15 DIAGNOSIS — F0280 Dementia in other diseases classified elsewhere without behavioral disturbance: Secondary | ICD-10-CM | POA: Diagnosis not present

## 2021-06-15 DIAGNOSIS — R634 Abnormal weight loss: Secondary | ICD-10-CM | POA: Diagnosis not present

## 2021-06-15 DIAGNOSIS — G309 Alzheimer's disease, unspecified: Secondary | ICD-10-CM | POA: Diagnosis not present

## 2021-06-15 DIAGNOSIS — I252 Old myocardial infarction: Secondary | ICD-10-CM | POA: Diagnosis not present

## 2021-06-15 DIAGNOSIS — I25119 Atherosclerotic heart disease of native coronary artery with unspecified angina pectoris: Secondary | ICD-10-CM | POA: Diagnosis not present

## 2021-06-15 DIAGNOSIS — J9611 Chronic respiratory failure with hypoxia: Secondary | ICD-10-CM | POA: Diagnosis not present

## 2021-06-16 DIAGNOSIS — R634 Abnormal weight loss: Secondary | ICD-10-CM | POA: Diagnosis not present

## 2021-06-16 DIAGNOSIS — I252 Old myocardial infarction: Secondary | ICD-10-CM | POA: Diagnosis not present

## 2021-06-16 DIAGNOSIS — I25119 Atherosclerotic heart disease of native coronary artery with unspecified angina pectoris: Secondary | ICD-10-CM | POA: Diagnosis not present

## 2021-06-16 DIAGNOSIS — F0280 Dementia in other diseases classified elsewhere without behavioral disturbance: Secondary | ICD-10-CM | POA: Diagnosis not present

## 2021-06-16 DIAGNOSIS — J9611 Chronic respiratory failure with hypoxia: Secondary | ICD-10-CM | POA: Diagnosis not present

## 2021-06-16 DIAGNOSIS — G309 Alzheimer's disease, unspecified: Secondary | ICD-10-CM | POA: Diagnosis not present

## 2021-06-18 DIAGNOSIS — J9611 Chronic respiratory failure with hypoxia: Secondary | ICD-10-CM | POA: Diagnosis not present

## 2021-06-18 DIAGNOSIS — F0280 Dementia in other diseases classified elsewhere without behavioral disturbance: Secondary | ICD-10-CM | POA: Diagnosis not present

## 2021-06-18 DIAGNOSIS — R634 Abnormal weight loss: Secondary | ICD-10-CM | POA: Diagnosis not present

## 2021-06-18 DIAGNOSIS — G309 Alzheimer's disease, unspecified: Secondary | ICD-10-CM | POA: Diagnosis not present

## 2021-06-18 DIAGNOSIS — I25119 Atherosclerotic heart disease of native coronary artery with unspecified angina pectoris: Secondary | ICD-10-CM | POA: Diagnosis not present

## 2021-06-18 DIAGNOSIS — I252 Old myocardial infarction: Secondary | ICD-10-CM | POA: Diagnosis not present

## 2021-06-22 DIAGNOSIS — F0280 Dementia in other diseases classified elsewhere without behavioral disturbance: Secondary | ICD-10-CM | POA: Diagnosis not present

## 2021-06-22 DIAGNOSIS — R634 Abnormal weight loss: Secondary | ICD-10-CM | POA: Diagnosis not present

## 2021-06-22 DIAGNOSIS — J9611 Chronic respiratory failure with hypoxia: Secondary | ICD-10-CM | POA: Diagnosis not present

## 2021-06-22 DIAGNOSIS — I25119 Atherosclerotic heart disease of native coronary artery with unspecified angina pectoris: Secondary | ICD-10-CM | POA: Diagnosis not present

## 2021-06-22 DIAGNOSIS — I252 Old myocardial infarction: Secondary | ICD-10-CM | POA: Diagnosis not present

## 2021-06-22 DIAGNOSIS — G309 Alzheimer's disease, unspecified: Secondary | ICD-10-CM | POA: Diagnosis not present

## 2021-06-23 DIAGNOSIS — F0280 Dementia in other diseases classified elsewhere without behavioral disturbance: Secondary | ICD-10-CM | POA: Diagnosis not present

## 2021-06-23 DIAGNOSIS — I252 Old myocardial infarction: Secondary | ICD-10-CM | POA: Diagnosis not present

## 2021-06-23 DIAGNOSIS — J9611 Chronic respiratory failure with hypoxia: Secondary | ICD-10-CM | POA: Diagnosis not present

## 2021-06-23 DIAGNOSIS — R634 Abnormal weight loss: Secondary | ICD-10-CM | POA: Diagnosis not present

## 2021-06-23 DIAGNOSIS — I25119 Atherosclerotic heart disease of native coronary artery with unspecified angina pectoris: Secondary | ICD-10-CM | POA: Diagnosis not present

## 2021-06-23 DIAGNOSIS — G309 Alzheimer's disease, unspecified: Secondary | ICD-10-CM | POA: Diagnosis not present

## 2021-06-24 DIAGNOSIS — J9611 Chronic respiratory failure with hypoxia: Secondary | ICD-10-CM | POA: Diagnosis not present

## 2021-06-24 DIAGNOSIS — I25119 Atherosclerotic heart disease of native coronary artery with unspecified angina pectoris: Secondary | ICD-10-CM | POA: Diagnosis not present

## 2021-06-24 DIAGNOSIS — R634 Abnormal weight loss: Secondary | ICD-10-CM | POA: Diagnosis not present

## 2021-06-24 DIAGNOSIS — I252 Old myocardial infarction: Secondary | ICD-10-CM | POA: Diagnosis not present

## 2021-06-24 DIAGNOSIS — G309 Alzheimer's disease, unspecified: Secondary | ICD-10-CM | POA: Diagnosis not present

## 2021-06-24 DIAGNOSIS — F0280 Dementia in other diseases classified elsewhere without behavioral disturbance: Secondary | ICD-10-CM | POA: Diagnosis not present

## 2021-06-25 DIAGNOSIS — J9611 Chronic respiratory failure with hypoxia: Secondary | ICD-10-CM | POA: Diagnosis not present

## 2021-06-25 DIAGNOSIS — R634 Abnormal weight loss: Secondary | ICD-10-CM | POA: Diagnosis not present

## 2021-06-25 DIAGNOSIS — F0280 Dementia in other diseases classified elsewhere without behavioral disturbance: Secondary | ICD-10-CM | POA: Diagnosis not present

## 2021-06-25 DIAGNOSIS — G309 Alzheimer's disease, unspecified: Secondary | ICD-10-CM | POA: Diagnosis not present

## 2021-06-25 DIAGNOSIS — I25119 Atherosclerotic heart disease of native coronary artery with unspecified angina pectoris: Secondary | ICD-10-CM | POA: Diagnosis not present

## 2021-06-25 DIAGNOSIS — I252 Old myocardial infarction: Secondary | ICD-10-CM | POA: Diagnosis not present

## 2021-06-29 DIAGNOSIS — F39 Unspecified mood [affective] disorder: Secondary | ICD-10-CM | POA: Diagnosis not present

## 2021-06-29 DIAGNOSIS — J9611 Chronic respiratory failure with hypoxia: Secondary | ICD-10-CM | POA: Diagnosis not present

## 2021-06-29 DIAGNOSIS — D61818 Other pancytopenia: Secondary | ICD-10-CM | POA: Diagnosis not present

## 2021-06-29 DIAGNOSIS — E440 Moderate protein-calorie malnutrition: Secondary | ICD-10-CM | POA: Diagnosis not present

## 2021-06-29 DIAGNOSIS — J449 Chronic obstructive pulmonary disease, unspecified: Secondary | ICD-10-CM | POA: Diagnosis not present

## 2021-06-29 DIAGNOSIS — G301 Alzheimer's disease with late onset: Secondary | ICD-10-CM | POA: Diagnosis not present

## 2021-06-30 DIAGNOSIS — J9611 Chronic respiratory failure with hypoxia: Secondary | ICD-10-CM | POA: Diagnosis not present

## 2021-06-30 DIAGNOSIS — F0280 Dementia in other diseases classified elsewhere without behavioral disturbance: Secondary | ICD-10-CM | POA: Diagnosis not present

## 2021-06-30 DIAGNOSIS — I252 Old myocardial infarction: Secondary | ICD-10-CM | POA: Diagnosis not present

## 2021-06-30 DIAGNOSIS — G309 Alzheimer's disease, unspecified: Secondary | ICD-10-CM | POA: Diagnosis not present

## 2021-06-30 DIAGNOSIS — I25119 Atherosclerotic heart disease of native coronary artery with unspecified angina pectoris: Secondary | ICD-10-CM | POA: Diagnosis not present

## 2021-06-30 DIAGNOSIS — R634 Abnormal weight loss: Secondary | ICD-10-CM | POA: Diagnosis not present

## 2021-07-02 DIAGNOSIS — J9611 Chronic respiratory failure with hypoxia: Secondary | ICD-10-CM | POA: Diagnosis not present

## 2021-07-02 DIAGNOSIS — F0280 Dementia in other diseases classified elsewhere without behavioral disturbance: Secondary | ICD-10-CM | POA: Diagnosis not present

## 2021-07-02 DIAGNOSIS — I252 Old myocardial infarction: Secondary | ICD-10-CM | POA: Diagnosis not present

## 2021-07-02 DIAGNOSIS — I25119 Atherosclerotic heart disease of native coronary artery with unspecified angina pectoris: Secondary | ICD-10-CM | POA: Diagnosis not present

## 2021-07-02 DIAGNOSIS — G309 Alzheimer's disease, unspecified: Secondary | ICD-10-CM | POA: Diagnosis not present

## 2021-07-02 DIAGNOSIS — R634 Abnormal weight loss: Secondary | ICD-10-CM | POA: Diagnosis not present

## 2021-07-04 DIAGNOSIS — R627 Adult failure to thrive: Secondary | ICD-10-CM | POA: Diagnosis not present

## 2021-07-04 DIAGNOSIS — R32 Unspecified urinary incontinence: Secondary | ICD-10-CM | POA: Diagnosis not present

## 2021-07-04 DIAGNOSIS — E785 Hyperlipidemia, unspecified: Secondary | ICD-10-CM | POA: Diagnosis not present

## 2021-07-04 DIAGNOSIS — I5030 Unspecified diastolic (congestive) heart failure: Secondary | ICD-10-CM | POA: Diagnosis not present

## 2021-07-04 DIAGNOSIS — Z741 Need for assistance with personal care: Secondary | ICD-10-CM | POA: Diagnosis not present

## 2021-07-04 DIAGNOSIS — J449 Chronic obstructive pulmonary disease, unspecified: Secondary | ICD-10-CM | POA: Diagnosis not present

## 2021-07-04 DIAGNOSIS — K219 Gastro-esophageal reflux disease without esophagitis: Secondary | ICD-10-CM | POA: Diagnosis not present

## 2021-07-04 DIAGNOSIS — R159 Full incontinence of feces: Secondary | ICD-10-CM | POA: Diagnosis not present

## 2021-07-04 DIAGNOSIS — F0280 Dementia in other diseases classified elsewhere without behavioral disturbance: Secondary | ICD-10-CM | POA: Diagnosis not present

## 2021-07-04 DIAGNOSIS — I130 Hypertensive heart and chronic kidney disease with heart failure and stage 1 through stage 4 chronic kidney disease, or unspecified chronic kidney disease: Secondary | ICD-10-CM | POA: Diagnosis not present

## 2021-07-04 DIAGNOSIS — G4700 Insomnia, unspecified: Secondary | ICD-10-CM | POA: Diagnosis not present

## 2021-07-04 DIAGNOSIS — E039 Hypothyroidism, unspecified: Secondary | ICD-10-CM | POA: Diagnosis not present

## 2021-07-04 DIAGNOSIS — R630 Anorexia: Secondary | ICD-10-CM | POA: Diagnosis not present

## 2021-07-04 DIAGNOSIS — G309 Alzheimer's disease, unspecified: Secondary | ICD-10-CM | POA: Diagnosis not present

## 2021-07-04 DIAGNOSIS — J9611 Chronic respiratory failure with hypoxia: Secondary | ICD-10-CM | POA: Diagnosis not present

## 2021-07-04 DIAGNOSIS — Z681 Body mass index (BMI) 19 or less, adult: Secondary | ICD-10-CM | POA: Diagnosis not present

## 2021-07-04 DIAGNOSIS — D631 Anemia in chronic kidney disease: Secondary | ICD-10-CM | POA: Diagnosis not present

## 2021-07-04 DIAGNOSIS — I252 Old myocardial infarction: Secondary | ICD-10-CM | POA: Diagnosis not present

## 2021-07-04 DIAGNOSIS — Z9981 Dependence on supplemental oxygen: Secondary | ICD-10-CM | POA: Diagnosis not present

## 2021-07-04 DIAGNOSIS — N184 Chronic kidney disease, stage 4 (severe): Secondary | ICD-10-CM | POA: Diagnosis not present

## 2021-07-04 DIAGNOSIS — G629 Polyneuropathy, unspecified: Secondary | ICD-10-CM | POA: Diagnosis not present

## 2021-07-04 DIAGNOSIS — I25119 Atherosclerotic heart disease of native coronary artery with unspecified angina pectoris: Secondary | ICD-10-CM | POA: Diagnosis not present

## 2021-07-04 DIAGNOSIS — Z7401 Bed confinement status: Secondary | ICD-10-CM | POA: Diagnosis not present

## 2021-07-04 DIAGNOSIS — R634 Abnormal weight loss: Secondary | ICD-10-CM | POA: Diagnosis not present

## 2021-07-04 DIAGNOSIS — Z8673 Personal history of transient ischemic attack (TIA), and cerebral infarction without residual deficits: Secondary | ICD-10-CM | POA: Diagnosis not present

## 2021-07-06 DIAGNOSIS — R634 Abnormal weight loss: Secondary | ICD-10-CM | POA: Diagnosis not present

## 2021-07-06 DIAGNOSIS — F0280 Dementia in other diseases classified elsewhere without behavioral disturbance: Secondary | ICD-10-CM | POA: Diagnosis not present

## 2021-07-06 DIAGNOSIS — J9611 Chronic respiratory failure with hypoxia: Secondary | ICD-10-CM | POA: Diagnosis not present

## 2021-07-06 DIAGNOSIS — I252 Old myocardial infarction: Secondary | ICD-10-CM | POA: Diagnosis not present

## 2021-07-06 DIAGNOSIS — I25119 Atherosclerotic heart disease of native coronary artery with unspecified angina pectoris: Secondary | ICD-10-CM | POA: Diagnosis not present

## 2021-07-06 DIAGNOSIS — G309 Alzheimer's disease, unspecified: Secondary | ICD-10-CM | POA: Diagnosis not present

## 2021-07-07 DIAGNOSIS — J9611 Chronic respiratory failure with hypoxia: Secondary | ICD-10-CM | POA: Diagnosis not present

## 2021-07-07 DIAGNOSIS — G309 Alzheimer's disease, unspecified: Secondary | ICD-10-CM | POA: Diagnosis not present

## 2021-07-07 DIAGNOSIS — I25119 Atherosclerotic heart disease of native coronary artery with unspecified angina pectoris: Secondary | ICD-10-CM | POA: Diagnosis not present

## 2021-07-07 DIAGNOSIS — I252 Old myocardial infarction: Secondary | ICD-10-CM | POA: Diagnosis not present

## 2021-07-07 DIAGNOSIS — F0280 Dementia in other diseases classified elsewhere without behavioral disturbance: Secondary | ICD-10-CM | POA: Diagnosis not present

## 2021-07-07 DIAGNOSIS — R634 Abnormal weight loss: Secondary | ICD-10-CM | POA: Diagnosis not present

## 2021-07-09 DIAGNOSIS — I25119 Atherosclerotic heart disease of native coronary artery with unspecified angina pectoris: Secondary | ICD-10-CM | POA: Diagnosis not present

## 2021-07-09 DIAGNOSIS — G309 Alzheimer's disease, unspecified: Secondary | ICD-10-CM | POA: Diagnosis not present

## 2021-07-09 DIAGNOSIS — I252 Old myocardial infarction: Secondary | ICD-10-CM | POA: Diagnosis not present

## 2021-07-09 DIAGNOSIS — J9611 Chronic respiratory failure with hypoxia: Secondary | ICD-10-CM | POA: Diagnosis not present

## 2021-07-09 DIAGNOSIS — F0280 Dementia in other diseases classified elsewhere without behavioral disturbance: Secondary | ICD-10-CM | POA: Diagnosis not present

## 2021-07-09 DIAGNOSIS — R634 Abnormal weight loss: Secondary | ICD-10-CM | POA: Diagnosis not present

## 2021-07-13 DIAGNOSIS — J9611 Chronic respiratory failure with hypoxia: Secondary | ICD-10-CM | POA: Diagnosis not present

## 2021-07-13 DIAGNOSIS — G309 Alzheimer's disease, unspecified: Secondary | ICD-10-CM | POA: Diagnosis not present

## 2021-07-13 DIAGNOSIS — I252 Old myocardial infarction: Secondary | ICD-10-CM | POA: Diagnosis not present

## 2021-07-13 DIAGNOSIS — F0280 Dementia in other diseases classified elsewhere without behavioral disturbance: Secondary | ICD-10-CM | POA: Diagnosis not present

## 2021-07-13 DIAGNOSIS — R634 Abnormal weight loss: Secondary | ICD-10-CM | POA: Diagnosis not present

## 2021-07-13 DIAGNOSIS — I25119 Atherosclerotic heart disease of native coronary artery with unspecified angina pectoris: Secondary | ICD-10-CM | POA: Diagnosis not present

## 2021-07-14 DIAGNOSIS — F0280 Dementia in other diseases classified elsewhere without behavioral disturbance: Secondary | ICD-10-CM | POA: Diagnosis not present

## 2021-07-14 DIAGNOSIS — I252 Old myocardial infarction: Secondary | ICD-10-CM | POA: Diagnosis not present

## 2021-07-14 DIAGNOSIS — J9611 Chronic respiratory failure with hypoxia: Secondary | ICD-10-CM | POA: Diagnosis not present

## 2021-07-14 DIAGNOSIS — R634 Abnormal weight loss: Secondary | ICD-10-CM | POA: Diagnosis not present

## 2021-07-14 DIAGNOSIS — I25119 Atherosclerotic heart disease of native coronary artery with unspecified angina pectoris: Secondary | ICD-10-CM | POA: Diagnosis not present

## 2021-07-14 DIAGNOSIS — G309 Alzheimer's disease, unspecified: Secondary | ICD-10-CM | POA: Diagnosis not present

## 2021-07-16 DIAGNOSIS — J9611 Chronic respiratory failure with hypoxia: Secondary | ICD-10-CM | POA: Diagnosis not present

## 2021-07-16 DIAGNOSIS — L603 Nail dystrophy: Secondary | ICD-10-CM | POA: Diagnosis not present

## 2021-07-16 DIAGNOSIS — R634 Abnormal weight loss: Secondary | ICD-10-CM | POA: Diagnosis not present

## 2021-07-16 DIAGNOSIS — G309 Alzheimer's disease, unspecified: Secondary | ICD-10-CM | POA: Diagnosis not present

## 2021-07-16 DIAGNOSIS — I252 Old myocardial infarction: Secondary | ICD-10-CM | POA: Diagnosis not present

## 2021-07-16 DIAGNOSIS — I25119 Atherosclerotic heart disease of native coronary artery with unspecified angina pectoris: Secondary | ICD-10-CM | POA: Diagnosis not present

## 2021-07-16 DIAGNOSIS — F0280 Dementia in other diseases classified elsewhere without behavioral disturbance: Secondary | ICD-10-CM | POA: Diagnosis not present

## 2021-07-17 DIAGNOSIS — G309 Alzheimer's disease, unspecified: Secondary | ICD-10-CM | POA: Diagnosis not present

## 2021-07-17 DIAGNOSIS — I252 Old myocardial infarction: Secondary | ICD-10-CM | POA: Diagnosis not present

## 2021-07-17 DIAGNOSIS — I25119 Atherosclerotic heart disease of native coronary artery with unspecified angina pectoris: Secondary | ICD-10-CM | POA: Diagnosis not present

## 2021-07-17 DIAGNOSIS — J9611 Chronic respiratory failure with hypoxia: Secondary | ICD-10-CM | POA: Diagnosis not present

## 2021-07-17 DIAGNOSIS — R634 Abnormal weight loss: Secondary | ICD-10-CM | POA: Diagnosis not present

## 2021-07-17 DIAGNOSIS — F0280 Dementia in other diseases classified elsewhere without behavioral disturbance: Secondary | ICD-10-CM | POA: Diagnosis not present

## 2021-07-20 DIAGNOSIS — G309 Alzheimer's disease, unspecified: Secondary | ICD-10-CM | POA: Diagnosis not present

## 2021-07-20 DIAGNOSIS — I25119 Atherosclerotic heart disease of native coronary artery with unspecified angina pectoris: Secondary | ICD-10-CM | POA: Diagnosis not present

## 2021-07-20 DIAGNOSIS — R634 Abnormal weight loss: Secondary | ICD-10-CM | POA: Diagnosis not present

## 2021-07-20 DIAGNOSIS — I252 Old myocardial infarction: Secondary | ICD-10-CM | POA: Diagnosis not present

## 2021-07-20 DIAGNOSIS — J9611 Chronic respiratory failure with hypoxia: Secondary | ICD-10-CM | POA: Diagnosis not present

## 2021-07-20 DIAGNOSIS — F0280 Dementia in other diseases classified elsewhere without behavioral disturbance: Secondary | ICD-10-CM | POA: Diagnosis not present

## 2021-07-21 DIAGNOSIS — I25119 Atherosclerotic heart disease of native coronary artery with unspecified angina pectoris: Secondary | ICD-10-CM | POA: Diagnosis not present

## 2021-07-21 DIAGNOSIS — J9611 Chronic respiratory failure with hypoxia: Secondary | ICD-10-CM | POA: Diagnosis not present

## 2021-07-21 DIAGNOSIS — I252 Old myocardial infarction: Secondary | ICD-10-CM | POA: Diagnosis not present

## 2021-07-21 DIAGNOSIS — G309 Alzheimer's disease, unspecified: Secondary | ICD-10-CM | POA: Diagnosis not present

## 2021-07-21 DIAGNOSIS — R634 Abnormal weight loss: Secondary | ICD-10-CM | POA: Diagnosis not present

## 2021-07-21 DIAGNOSIS — F0280 Dementia in other diseases classified elsewhere without behavioral disturbance: Secondary | ICD-10-CM | POA: Diagnosis not present

## 2021-07-23 DIAGNOSIS — F0280 Dementia in other diseases classified elsewhere without behavioral disturbance: Secondary | ICD-10-CM | POA: Diagnosis not present

## 2021-07-23 DIAGNOSIS — G309 Alzheimer's disease, unspecified: Secondary | ICD-10-CM | POA: Diagnosis not present

## 2021-07-23 DIAGNOSIS — J9611 Chronic respiratory failure with hypoxia: Secondary | ICD-10-CM | POA: Diagnosis not present

## 2021-07-23 DIAGNOSIS — I25119 Atherosclerotic heart disease of native coronary artery with unspecified angina pectoris: Secondary | ICD-10-CM | POA: Diagnosis not present

## 2021-07-23 DIAGNOSIS — R634 Abnormal weight loss: Secondary | ICD-10-CM | POA: Diagnosis not present

## 2021-07-23 DIAGNOSIS — I252 Old myocardial infarction: Secondary | ICD-10-CM | POA: Diagnosis not present

## 2021-07-24 DIAGNOSIS — R634 Abnormal weight loss: Secondary | ICD-10-CM | POA: Diagnosis not present

## 2021-07-24 DIAGNOSIS — I25119 Atherosclerotic heart disease of native coronary artery with unspecified angina pectoris: Secondary | ICD-10-CM | POA: Diagnosis not present

## 2021-07-24 DIAGNOSIS — G309 Alzheimer's disease, unspecified: Secondary | ICD-10-CM | POA: Diagnosis not present

## 2021-07-24 DIAGNOSIS — F0280 Dementia in other diseases classified elsewhere without behavioral disturbance: Secondary | ICD-10-CM | POA: Diagnosis not present

## 2021-07-24 DIAGNOSIS — J9611 Chronic respiratory failure with hypoxia: Secondary | ICD-10-CM | POA: Diagnosis not present

## 2021-07-24 DIAGNOSIS — I252 Old myocardial infarction: Secondary | ICD-10-CM | POA: Diagnosis not present

## 2021-07-25 DIAGNOSIS — F0280 Dementia in other diseases classified elsewhere without behavioral disturbance: Secondary | ICD-10-CM | POA: Diagnosis not present

## 2021-07-25 DIAGNOSIS — I252 Old myocardial infarction: Secondary | ICD-10-CM | POA: Diagnosis not present

## 2021-07-25 DIAGNOSIS — R634 Abnormal weight loss: Secondary | ICD-10-CM | POA: Diagnosis not present

## 2021-07-25 DIAGNOSIS — G309 Alzheimer's disease, unspecified: Secondary | ICD-10-CM | POA: Diagnosis not present

## 2021-07-25 DIAGNOSIS — I25119 Atherosclerotic heart disease of native coronary artery with unspecified angina pectoris: Secondary | ICD-10-CM | POA: Diagnosis not present

## 2021-07-25 DIAGNOSIS — J9611 Chronic respiratory failure with hypoxia: Secondary | ICD-10-CM | POA: Diagnosis not present

## 2021-07-27 DIAGNOSIS — R634 Abnormal weight loss: Secondary | ICD-10-CM | POA: Diagnosis not present

## 2021-07-27 DIAGNOSIS — I25119 Atherosclerotic heart disease of native coronary artery with unspecified angina pectoris: Secondary | ICD-10-CM | POA: Diagnosis not present

## 2021-07-27 DIAGNOSIS — F0280 Dementia in other diseases classified elsewhere without behavioral disturbance: Secondary | ICD-10-CM | POA: Diagnosis not present

## 2021-07-27 DIAGNOSIS — J9611 Chronic respiratory failure with hypoxia: Secondary | ICD-10-CM | POA: Diagnosis not present

## 2021-07-27 DIAGNOSIS — I252 Old myocardial infarction: Secondary | ICD-10-CM | POA: Diagnosis not present

## 2021-07-27 DIAGNOSIS — G309 Alzheimer's disease, unspecified: Secondary | ICD-10-CM | POA: Diagnosis not present

## 2021-07-28 DIAGNOSIS — R634 Abnormal weight loss: Secondary | ICD-10-CM | POA: Diagnosis not present

## 2021-07-28 DIAGNOSIS — I25119 Atherosclerotic heart disease of native coronary artery with unspecified angina pectoris: Secondary | ICD-10-CM | POA: Diagnosis not present

## 2021-07-28 DIAGNOSIS — J9611 Chronic respiratory failure with hypoxia: Secondary | ICD-10-CM | POA: Diagnosis not present

## 2021-07-28 DIAGNOSIS — G309 Alzheimer's disease, unspecified: Secondary | ICD-10-CM | POA: Diagnosis not present

## 2021-07-28 DIAGNOSIS — I252 Old myocardial infarction: Secondary | ICD-10-CM | POA: Diagnosis not present

## 2021-07-28 DIAGNOSIS — F0280 Dementia in other diseases classified elsewhere without behavioral disturbance: Secondary | ICD-10-CM | POA: Diagnosis not present

## 2021-07-29 DIAGNOSIS — R634 Abnormal weight loss: Secondary | ICD-10-CM | POA: Diagnosis not present

## 2021-07-29 DIAGNOSIS — J9611 Chronic respiratory failure with hypoxia: Secondary | ICD-10-CM | POA: Diagnosis not present

## 2021-07-29 DIAGNOSIS — G309 Alzheimer's disease, unspecified: Secondary | ICD-10-CM | POA: Diagnosis not present

## 2021-07-29 DIAGNOSIS — I25119 Atherosclerotic heart disease of native coronary artery with unspecified angina pectoris: Secondary | ICD-10-CM | POA: Diagnosis not present

## 2021-07-29 DIAGNOSIS — F0280 Dementia in other diseases classified elsewhere without behavioral disturbance: Secondary | ICD-10-CM | POA: Diagnosis not present

## 2021-07-29 DIAGNOSIS — I252 Old myocardial infarction: Secondary | ICD-10-CM | POA: Diagnosis not present

## 2021-07-30 DIAGNOSIS — I252 Old myocardial infarction: Secondary | ICD-10-CM | POA: Diagnosis not present

## 2021-07-30 DIAGNOSIS — F0280 Dementia in other diseases classified elsewhere without behavioral disturbance: Secondary | ICD-10-CM | POA: Diagnosis not present

## 2021-07-30 DIAGNOSIS — R634 Abnormal weight loss: Secondary | ICD-10-CM | POA: Diagnosis not present

## 2021-07-30 DIAGNOSIS — I25119 Atherosclerotic heart disease of native coronary artery with unspecified angina pectoris: Secondary | ICD-10-CM | POA: Diagnosis not present

## 2021-07-30 DIAGNOSIS — J9611 Chronic respiratory failure with hypoxia: Secondary | ICD-10-CM | POA: Diagnosis not present

## 2021-07-30 DIAGNOSIS — E039 Hypothyroidism, unspecified: Secondary | ICD-10-CM | POA: Diagnosis not present

## 2021-07-30 DIAGNOSIS — G309 Alzheimer's disease, unspecified: Secondary | ICD-10-CM | POA: Diagnosis not present

## 2021-07-31 DIAGNOSIS — I25119 Atherosclerotic heart disease of native coronary artery with unspecified angina pectoris: Secondary | ICD-10-CM | POA: Diagnosis not present

## 2021-07-31 DIAGNOSIS — R634 Abnormal weight loss: Secondary | ICD-10-CM | POA: Diagnosis not present

## 2021-07-31 DIAGNOSIS — G309 Alzheimer's disease, unspecified: Secondary | ICD-10-CM | POA: Diagnosis not present

## 2021-07-31 DIAGNOSIS — J9611 Chronic respiratory failure with hypoxia: Secondary | ICD-10-CM | POA: Diagnosis not present

## 2021-07-31 DIAGNOSIS — I252 Old myocardial infarction: Secondary | ICD-10-CM | POA: Diagnosis not present

## 2021-07-31 DIAGNOSIS — F0280 Dementia in other diseases classified elsewhere without behavioral disturbance: Secondary | ICD-10-CM | POA: Diagnosis not present

## 2021-08-04 DIAGNOSIS — F0280 Dementia in other diseases classified elsewhere without behavioral disturbance: Secondary | ICD-10-CM | POA: Diagnosis not present

## 2021-08-04 DIAGNOSIS — Z681 Body mass index (BMI) 19 or less, adult: Secondary | ICD-10-CM | POA: Diagnosis not present

## 2021-08-04 DIAGNOSIS — Z8673 Personal history of transient ischemic attack (TIA), and cerebral infarction without residual deficits: Secondary | ICD-10-CM | POA: Diagnosis not present

## 2021-08-04 DIAGNOSIS — I25119 Atherosclerotic heart disease of native coronary artery with unspecified angina pectoris: Secondary | ICD-10-CM | POA: Diagnosis not present

## 2021-08-04 DIAGNOSIS — G629 Polyneuropathy, unspecified: Secondary | ICD-10-CM | POA: Diagnosis not present

## 2021-08-04 DIAGNOSIS — K219 Gastro-esophageal reflux disease without esophagitis: Secondary | ICD-10-CM | POA: Diagnosis not present

## 2021-08-04 DIAGNOSIS — Z741 Need for assistance with personal care: Secondary | ICD-10-CM | POA: Diagnosis not present

## 2021-08-04 DIAGNOSIS — R634 Abnormal weight loss: Secondary | ICD-10-CM | POA: Diagnosis not present

## 2021-08-04 DIAGNOSIS — J9611 Chronic respiratory failure with hypoxia: Secondary | ICD-10-CM | POA: Diagnosis not present

## 2021-08-04 DIAGNOSIS — E039 Hypothyroidism, unspecified: Secondary | ICD-10-CM | POA: Diagnosis not present

## 2021-08-04 DIAGNOSIS — D631 Anemia in chronic kidney disease: Secondary | ICD-10-CM | POA: Diagnosis not present

## 2021-08-04 DIAGNOSIS — I130 Hypertensive heart and chronic kidney disease with heart failure and stage 1 through stage 4 chronic kidney disease, or unspecified chronic kidney disease: Secondary | ICD-10-CM | POA: Diagnosis not present

## 2021-08-04 DIAGNOSIS — R159 Full incontinence of feces: Secondary | ICD-10-CM | POA: Diagnosis not present

## 2021-08-04 DIAGNOSIS — J449 Chronic obstructive pulmonary disease, unspecified: Secondary | ICD-10-CM | POA: Diagnosis not present

## 2021-08-04 DIAGNOSIS — Z7401 Bed confinement status: Secondary | ICD-10-CM | POA: Diagnosis not present

## 2021-08-04 DIAGNOSIS — N184 Chronic kidney disease, stage 4 (severe): Secondary | ICD-10-CM | POA: Diagnosis not present

## 2021-08-04 DIAGNOSIS — R627 Adult failure to thrive: Secondary | ICD-10-CM | POA: Diagnosis not present

## 2021-08-04 DIAGNOSIS — R32 Unspecified urinary incontinence: Secondary | ICD-10-CM | POA: Diagnosis not present

## 2021-08-04 DIAGNOSIS — G309 Alzheimer's disease, unspecified: Secondary | ICD-10-CM | POA: Diagnosis not present

## 2021-08-04 DIAGNOSIS — I252 Old myocardial infarction: Secondary | ICD-10-CM | POA: Diagnosis not present

## 2021-08-04 DIAGNOSIS — I5030 Unspecified diastolic (congestive) heart failure: Secondary | ICD-10-CM | POA: Diagnosis not present

## 2021-08-04 DIAGNOSIS — G4700 Insomnia, unspecified: Secondary | ICD-10-CM | POA: Diagnosis not present

## 2021-08-04 DIAGNOSIS — E785 Hyperlipidemia, unspecified: Secondary | ICD-10-CM | POA: Diagnosis not present

## 2021-08-04 DIAGNOSIS — Z9981 Dependence on supplemental oxygen: Secondary | ICD-10-CM | POA: Diagnosis not present

## 2021-08-04 DIAGNOSIS — R630 Anorexia: Secondary | ICD-10-CM | POA: Diagnosis not present

## 2021-08-05 DIAGNOSIS — R634 Abnormal weight loss: Secondary | ICD-10-CM | POA: Diagnosis not present

## 2021-08-05 DIAGNOSIS — F0280 Dementia in other diseases classified elsewhere without behavioral disturbance: Secondary | ICD-10-CM | POA: Diagnosis not present

## 2021-08-05 DIAGNOSIS — G309 Alzheimer's disease, unspecified: Secondary | ICD-10-CM | POA: Diagnosis not present

## 2021-08-05 DIAGNOSIS — I252 Old myocardial infarction: Secondary | ICD-10-CM | POA: Diagnosis not present

## 2021-08-05 DIAGNOSIS — I25119 Atherosclerotic heart disease of native coronary artery with unspecified angina pectoris: Secondary | ICD-10-CM | POA: Diagnosis not present

## 2021-08-05 DIAGNOSIS — J9611 Chronic respiratory failure with hypoxia: Secondary | ICD-10-CM | POA: Diagnosis not present

## 2021-08-06 DIAGNOSIS — F0280 Dementia in other diseases classified elsewhere without behavioral disturbance: Secondary | ICD-10-CM | POA: Diagnosis not present

## 2021-08-06 DIAGNOSIS — R634 Abnormal weight loss: Secondary | ICD-10-CM | POA: Diagnosis not present

## 2021-08-06 DIAGNOSIS — I252 Old myocardial infarction: Secondary | ICD-10-CM | POA: Diagnosis not present

## 2021-08-06 DIAGNOSIS — I25119 Atherosclerotic heart disease of native coronary artery with unspecified angina pectoris: Secondary | ICD-10-CM | POA: Diagnosis not present

## 2021-08-06 DIAGNOSIS — J9611 Chronic respiratory failure with hypoxia: Secondary | ICD-10-CM | POA: Diagnosis not present

## 2021-08-06 DIAGNOSIS — G309 Alzheimer's disease, unspecified: Secondary | ICD-10-CM | POA: Diagnosis not present

## 2021-08-07 DIAGNOSIS — J9611 Chronic respiratory failure with hypoxia: Secondary | ICD-10-CM | POA: Diagnosis not present

## 2021-08-07 DIAGNOSIS — G309 Alzheimer's disease, unspecified: Secondary | ICD-10-CM | POA: Diagnosis not present

## 2021-08-07 DIAGNOSIS — R634 Abnormal weight loss: Secondary | ICD-10-CM | POA: Diagnosis not present

## 2021-08-07 DIAGNOSIS — I252 Old myocardial infarction: Secondary | ICD-10-CM | POA: Diagnosis not present

## 2021-08-07 DIAGNOSIS — I25119 Atherosclerotic heart disease of native coronary artery with unspecified angina pectoris: Secondary | ICD-10-CM | POA: Diagnosis not present

## 2021-08-07 DIAGNOSIS — F0280 Dementia in other diseases classified elsewhere without behavioral disturbance: Secondary | ICD-10-CM | POA: Diagnosis not present

## 2021-08-10 DIAGNOSIS — F0280 Dementia in other diseases classified elsewhere without behavioral disturbance: Secondary | ICD-10-CM | POA: Diagnosis not present

## 2021-08-10 DIAGNOSIS — J9611 Chronic respiratory failure with hypoxia: Secondary | ICD-10-CM | POA: Diagnosis not present

## 2021-08-10 DIAGNOSIS — I252 Old myocardial infarction: Secondary | ICD-10-CM | POA: Diagnosis not present

## 2021-08-10 DIAGNOSIS — I25119 Atherosclerotic heart disease of native coronary artery with unspecified angina pectoris: Secondary | ICD-10-CM | POA: Diagnosis not present

## 2021-08-10 DIAGNOSIS — G309 Alzheimer's disease, unspecified: Secondary | ICD-10-CM | POA: Diagnosis not present

## 2021-08-10 DIAGNOSIS — R634 Abnormal weight loss: Secondary | ICD-10-CM | POA: Diagnosis not present

## 2021-08-11 DIAGNOSIS — G309 Alzheimer's disease, unspecified: Secondary | ICD-10-CM | POA: Diagnosis not present

## 2021-08-11 DIAGNOSIS — J9611 Chronic respiratory failure with hypoxia: Secondary | ICD-10-CM | POA: Diagnosis not present

## 2021-08-11 DIAGNOSIS — I252 Old myocardial infarction: Secondary | ICD-10-CM | POA: Diagnosis not present

## 2021-08-11 DIAGNOSIS — R634 Abnormal weight loss: Secondary | ICD-10-CM | POA: Diagnosis not present

## 2021-08-11 DIAGNOSIS — I25119 Atherosclerotic heart disease of native coronary artery with unspecified angina pectoris: Secondary | ICD-10-CM | POA: Diagnosis not present

## 2021-08-11 DIAGNOSIS — F0280 Dementia in other diseases classified elsewhere without behavioral disturbance: Secondary | ICD-10-CM | POA: Diagnosis not present

## 2021-08-13 DIAGNOSIS — I252 Old myocardial infarction: Secondary | ICD-10-CM | POA: Diagnosis not present

## 2021-08-13 DIAGNOSIS — J9611 Chronic respiratory failure with hypoxia: Secondary | ICD-10-CM | POA: Diagnosis not present

## 2021-08-13 DIAGNOSIS — G309 Alzheimer's disease, unspecified: Secondary | ICD-10-CM | POA: Diagnosis not present

## 2021-08-13 DIAGNOSIS — I25119 Atherosclerotic heart disease of native coronary artery with unspecified angina pectoris: Secondary | ICD-10-CM | POA: Diagnosis not present

## 2021-08-13 DIAGNOSIS — F0280 Dementia in other diseases classified elsewhere without behavioral disturbance: Secondary | ICD-10-CM | POA: Diagnosis not present

## 2021-08-13 DIAGNOSIS — R634 Abnormal weight loss: Secondary | ICD-10-CM | POA: Diagnosis not present

## 2021-08-14 DIAGNOSIS — K219 Gastro-esophageal reflux disease without esophagitis: Secondary | ICD-10-CM

## 2021-08-14 DIAGNOSIS — G309 Alzheimer's disease, unspecified: Secondary | ICD-10-CM

## 2021-08-14 DIAGNOSIS — E039 Hypothyroidism, unspecified: Secondary | ICD-10-CM

## 2021-08-14 DIAGNOSIS — J9611 Chronic respiratory failure with hypoxia: Secondary | ICD-10-CM

## 2021-08-14 DIAGNOSIS — I10 Essential (primary) hypertension: Secondary | ICD-10-CM

## 2021-08-14 DIAGNOSIS — F39 Unspecified mood [affective] disorder: Secondary | ICD-10-CM

## 2021-08-14 DIAGNOSIS — E43 Unspecified severe protein-calorie malnutrition: Secondary | ICD-10-CM

## 2021-08-14 DIAGNOSIS — J069 Acute upper respiratory infection, unspecified: Secondary | ICD-10-CM

## 2021-08-14 DIAGNOSIS — D649 Anemia, unspecified: Secondary | ICD-10-CM

## 2021-08-14 DIAGNOSIS — I700 Atherosclerosis of aorta: Secondary | ICD-10-CM

## 2021-08-14 DIAGNOSIS — N183 Chronic kidney disease, stage 3 unspecified: Secondary | ICD-10-CM

## 2021-08-17 DIAGNOSIS — I25119 Atherosclerotic heart disease of native coronary artery with unspecified angina pectoris: Secondary | ICD-10-CM | POA: Diagnosis not present

## 2021-08-17 DIAGNOSIS — G309 Alzheimer's disease, unspecified: Secondary | ICD-10-CM | POA: Diagnosis not present

## 2021-08-17 DIAGNOSIS — R634 Abnormal weight loss: Secondary | ICD-10-CM | POA: Diagnosis not present

## 2021-08-17 DIAGNOSIS — I252 Old myocardial infarction: Secondary | ICD-10-CM | POA: Diagnosis not present

## 2021-08-17 DIAGNOSIS — F0280 Dementia in other diseases classified elsewhere without behavioral disturbance: Secondary | ICD-10-CM | POA: Diagnosis not present

## 2021-08-17 DIAGNOSIS — J9611 Chronic respiratory failure with hypoxia: Secondary | ICD-10-CM | POA: Diagnosis not present

## 2021-08-18 DIAGNOSIS — G309 Alzheimer's disease, unspecified: Secondary | ICD-10-CM | POA: Diagnosis not present

## 2021-08-18 DIAGNOSIS — I252 Old myocardial infarction: Secondary | ICD-10-CM | POA: Diagnosis not present

## 2021-08-18 DIAGNOSIS — J9611 Chronic respiratory failure with hypoxia: Secondary | ICD-10-CM | POA: Diagnosis not present

## 2021-08-18 DIAGNOSIS — I25119 Atherosclerotic heart disease of native coronary artery with unspecified angina pectoris: Secondary | ICD-10-CM | POA: Diagnosis not present

## 2021-08-18 DIAGNOSIS — R634 Abnormal weight loss: Secondary | ICD-10-CM | POA: Diagnosis not present

## 2021-08-18 DIAGNOSIS — F0280 Dementia in other diseases classified elsewhere without behavioral disturbance: Secondary | ICD-10-CM | POA: Diagnosis not present

## 2021-08-20 DIAGNOSIS — I252 Old myocardial infarction: Secondary | ICD-10-CM | POA: Diagnosis not present

## 2021-08-20 DIAGNOSIS — R634 Abnormal weight loss: Secondary | ICD-10-CM | POA: Diagnosis not present

## 2021-08-20 DIAGNOSIS — I25119 Atherosclerotic heart disease of native coronary artery with unspecified angina pectoris: Secondary | ICD-10-CM | POA: Diagnosis not present

## 2021-08-20 DIAGNOSIS — G309 Alzheimer's disease, unspecified: Secondary | ICD-10-CM | POA: Diagnosis not present

## 2021-08-20 DIAGNOSIS — F0280 Dementia in other diseases classified elsewhere without behavioral disturbance: Secondary | ICD-10-CM | POA: Diagnosis not present

## 2021-08-20 DIAGNOSIS — J9611 Chronic respiratory failure with hypoxia: Secondary | ICD-10-CM | POA: Diagnosis not present

## 2021-08-24 DIAGNOSIS — F0280 Dementia in other diseases classified elsewhere without behavioral disturbance: Secondary | ICD-10-CM | POA: Diagnosis not present

## 2021-08-24 DIAGNOSIS — G309 Alzheimer's disease, unspecified: Secondary | ICD-10-CM | POA: Diagnosis not present

## 2021-08-24 DIAGNOSIS — R634 Abnormal weight loss: Secondary | ICD-10-CM | POA: Diagnosis not present

## 2021-08-24 DIAGNOSIS — J9611 Chronic respiratory failure with hypoxia: Secondary | ICD-10-CM | POA: Diagnosis not present

## 2021-08-24 DIAGNOSIS — I25119 Atherosclerotic heart disease of native coronary artery with unspecified angina pectoris: Secondary | ICD-10-CM | POA: Diagnosis not present

## 2021-08-24 DIAGNOSIS — I252 Old myocardial infarction: Secondary | ICD-10-CM | POA: Diagnosis not present

## 2021-08-25 DIAGNOSIS — G309 Alzheimer's disease, unspecified: Secondary | ICD-10-CM | POA: Diagnosis not present

## 2021-08-25 DIAGNOSIS — J9611 Chronic respiratory failure with hypoxia: Secondary | ICD-10-CM | POA: Diagnosis not present

## 2021-08-25 DIAGNOSIS — R634 Abnormal weight loss: Secondary | ICD-10-CM | POA: Diagnosis not present

## 2021-08-25 DIAGNOSIS — I252 Old myocardial infarction: Secondary | ICD-10-CM | POA: Diagnosis not present

## 2021-08-25 DIAGNOSIS — F0280 Dementia in other diseases classified elsewhere without behavioral disturbance: Secondary | ICD-10-CM | POA: Diagnosis not present

## 2021-08-25 DIAGNOSIS — I25119 Atherosclerotic heart disease of native coronary artery with unspecified angina pectoris: Secondary | ICD-10-CM | POA: Diagnosis not present

## 2021-08-27 DIAGNOSIS — I252 Old myocardial infarction: Secondary | ICD-10-CM | POA: Diagnosis not present

## 2021-08-27 DIAGNOSIS — I25119 Atherosclerotic heart disease of native coronary artery with unspecified angina pectoris: Secondary | ICD-10-CM | POA: Diagnosis not present

## 2021-08-27 DIAGNOSIS — J9611 Chronic respiratory failure with hypoxia: Secondary | ICD-10-CM | POA: Diagnosis not present

## 2021-08-27 DIAGNOSIS — F0280 Dementia in other diseases classified elsewhere without behavioral disturbance: Secondary | ICD-10-CM | POA: Diagnosis not present

## 2021-08-27 DIAGNOSIS — R634 Abnormal weight loss: Secondary | ICD-10-CM | POA: Diagnosis not present

## 2021-08-27 DIAGNOSIS — G309 Alzheimer's disease, unspecified: Secondary | ICD-10-CM | POA: Diagnosis not present

## 2021-08-31 DIAGNOSIS — R634 Abnormal weight loss: Secondary | ICD-10-CM | POA: Diagnosis not present

## 2021-08-31 DIAGNOSIS — J9611 Chronic respiratory failure with hypoxia: Secondary | ICD-10-CM | POA: Diagnosis not present

## 2021-08-31 DIAGNOSIS — G309 Alzheimer's disease, unspecified: Secondary | ICD-10-CM | POA: Diagnosis not present

## 2021-08-31 DIAGNOSIS — I25119 Atherosclerotic heart disease of native coronary artery with unspecified angina pectoris: Secondary | ICD-10-CM | POA: Diagnosis not present

## 2021-08-31 DIAGNOSIS — I252 Old myocardial infarction: Secondary | ICD-10-CM | POA: Diagnosis not present

## 2021-08-31 DIAGNOSIS — F0280 Dementia in other diseases classified elsewhere without behavioral disturbance: Secondary | ICD-10-CM | POA: Diagnosis not present

## 2021-09-01 DIAGNOSIS — F0280 Dementia in other diseases classified elsewhere without behavioral disturbance: Secondary | ICD-10-CM | POA: Diagnosis not present

## 2021-09-01 DIAGNOSIS — I25119 Atherosclerotic heart disease of native coronary artery with unspecified angina pectoris: Secondary | ICD-10-CM | POA: Diagnosis not present

## 2021-09-01 DIAGNOSIS — G309 Alzheimer's disease, unspecified: Secondary | ICD-10-CM | POA: Diagnosis not present

## 2021-09-01 DIAGNOSIS — I252 Old myocardial infarction: Secondary | ICD-10-CM | POA: Diagnosis not present

## 2021-09-01 DIAGNOSIS — J9611 Chronic respiratory failure with hypoxia: Secondary | ICD-10-CM | POA: Diagnosis not present

## 2021-09-01 DIAGNOSIS — R634 Abnormal weight loss: Secondary | ICD-10-CM | POA: Diagnosis not present

## 2021-09-03 DIAGNOSIS — I252 Old myocardial infarction: Secondary | ICD-10-CM | POA: Diagnosis not present

## 2021-09-03 DIAGNOSIS — G309 Alzheimer's disease, unspecified: Secondary | ICD-10-CM | POA: Diagnosis not present

## 2021-09-03 DIAGNOSIS — R634 Abnormal weight loss: Secondary | ICD-10-CM | POA: Diagnosis not present

## 2021-09-03 DIAGNOSIS — I25119 Atherosclerotic heart disease of native coronary artery with unspecified angina pectoris: Secondary | ICD-10-CM | POA: Diagnosis not present

## 2021-09-03 DIAGNOSIS — F0280 Dementia in other diseases classified elsewhere without behavioral disturbance: Secondary | ICD-10-CM | POA: Diagnosis not present

## 2021-09-03 DIAGNOSIS — J9611 Chronic respiratory failure with hypoxia: Secondary | ICD-10-CM | POA: Diagnosis not present

## 2021-09-04 DIAGNOSIS — G4700 Insomnia, unspecified: Secondary | ICD-10-CM | POA: Diagnosis not present

## 2021-09-04 DIAGNOSIS — Z681 Body mass index (BMI) 19 or less, adult: Secondary | ICD-10-CM | POA: Diagnosis not present

## 2021-09-04 DIAGNOSIS — F0280 Dementia in other diseases classified elsewhere without behavioral disturbance: Secondary | ICD-10-CM | POA: Diagnosis not present

## 2021-09-04 DIAGNOSIS — Z7401 Bed confinement status: Secondary | ICD-10-CM | POA: Diagnosis not present

## 2021-09-04 DIAGNOSIS — R159 Full incontinence of feces: Secondary | ICD-10-CM | POA: Diagnosis not present

## 2021-09-04 DIAGNOSIS — Z741 Need for assistance with personal care: Secondary | ICD-10-CM | POA: Diagnosis not present

## 2021-09-04 DIAGNOSIS — I5030 Unspecified diastolic (congestive) heart failure: Secondary | ICD-10-CM | POA: Diagnosis not present

## 2021-09-04 DIAGNOSIS — E785 Hyperlipidemia, unspecified: Secondary | ICD-10-CM | POA: Diagnosis not present

## 2021-09-04 DIAGNOSIS — D631 Anemia in chronic kidney disease: Secondary | ICD-10-CM | POA: Diagnosis not present

## 2021-09-04 DIAGNOSIS — Z8673 Personal history of transient ischemic attack (TIA), and cerebral infarction without residual deficits: Secondary | ICD-10-CM | POA: Diagnosis not present

## 2021-09-04 DIAGNOSIS — Z9981 Dependence on supplemental oxygen: Secondary | ICD-10-CM | POA: Diagnosis not present

## 2021-09-04 DIAGNOSIS — R32 Unspecified urinary incontinence: Secondary | ICD-10-CM | POA: Diagnosis not present

## 2021-09-04 DIAGNOSIS — R627 Adult failure to thrive: Secondary | ICD-10-CM | POA: Diagnosis not present

## 2021-09-04 DIAGNOSIS — R634 Abnormal weight loss: Secondary | ICD-10-CM | POA: Diagnosis not present

## 2021-09-04 DIAGNOSIS — G629 Polyneuropathy, unspecified: Secondary | ICD-10-CM | POA: Diagnosis not present

## 2021-09-04 DIAGNOSIS — I130 Hypertensive heart and chronic kidney disease with heart failure and stage 1 through stage 4 chronic kidney disease, or unspecified chronic kidney disease: Secondary | ICD-10-CM | POA: Diagnosis not present

## 2021-09-04 DIAGNOSIS — K219 Gastro-esophageal reflux disease without esophagitis: Secondary | ICD-10-CM | POA: Diagnosis not present

## 2021-09-04 DIAGNOSIS — R630 Anorexia: Secondary | ICD-10-CM | POA: Diagnosis not present

## 2021-09-04 DIAGNOSIS — J9611 Chronic respiratory failure with hypoxia: Secondary | ICD-10-CM | POA: Diagnosis not present

## 2021-09-04 DIAGNOSIS — G309 Alzheimer's disease, unspecified: Secondary | ICD-10-CM | POA: Diagnosis not present

## 2021-09-04 DIAGNOSIS — I252 Old myocardial infarction: Secondary | ICD-10-CM | POA: Diagnosis not present

## 2021-09-04 DIAGNOSIS — N184 Chronic kidney disease, stage 4 (severe): Secondary | ICD-10-CM | POA: Diagnosis not present

## 2021-09-04 DIAGNOSIS — J449 Chronic obstructive pulmonary disease, unspecified: Secondary | ICD-10-CM | POA: Diagnosis not present

## 2021-09-04 DIAGNOSIS — I25119 Atherosclerotic heart disease of native coronary artery with unspecified angina pectoris: Secondary | ICD-10-CM | POA: Diagnosis not present

## 2021-09-04 DIAGNOSIS — E039 Hypothyroidism, unspecified: Secondary | ICD-10-CM | POA: Diagnosis not present

## 2021-09-08 DIAGNOSIS — I252 Old myocardial infarction: Secondary | ICD-10-CM | POA: Diagnosis not present

## 2021-09-08 DIAGNOSIS — I25119 Atherosclerotic heart disease of native coronary artery with unspecified angina pectoris: Secondary | ICD-10-CM | POA: Diagnosis not present

## 2021-09-08 DIAGNOSIS — J9611 Chronic respiratory failure with hypoxia: Secondary | ICD-10-CM | POA: Diagnosis not present

## 2021-09-08 DIAGNOSIS — R634 Abnormal weight loss: Secondary | ICD-10-CM | POA: Diagnosis not present

## 2021-09-08 DIAGNOSIS — F0280 Dementia in other diseases classified elsewhere without behavioral disturbance: Secondary | ICD-10-CM | POA: Diagnosis not present

## 2021-09-08 DIAGNOSIS — G309 Alzheimer's disease, unspecified: Secondary | ICD-10-CM | POA: Diagnosis not present

## 2021-09-10 DIAGNOSIS — G309 Alzheimer's disease, unspecified: Secondary | ICD-10-CM | POA: Diagnosis not present

## 2021-09-10 DIAGNOSIS — I252 Old myocardial infarction: Secondary | ICD-10-CM | POA: Diagnosis not present

## 2021-09-10 DIAGNOSIS — J9611 Chronic respiratory failure with hypoxia: Secondary | ICD-10-CM | POA: Diagnosis not present

## 2021-09-10 DIAGNOSIS — R634 Abnormal weight loss: Secondary | ICD-10-CM | POA: Diagnosis not present

## 2021-09-10 DIAGNOSIS — F0280 Dementia in other diseases classified elsewhere without behavioral disturbance: Secondary | ICD-10-CM | POA: Diagnosis not present

## 2021-09-10 DIAGNOSIS — I25119 Atherosclerotic heart disease of native coronary artery with unspecified angina pectoris: Secondary | ICD-10-CM | POA: Diagnosis not present

## 2021-09-14 DIAGNOSIS — G309 Alzheimer's disease, unspecified: Secondary | ICD-10-CM | POA: Diagnosis not present

## 2021-09-14 DIAGNOSIS — F0280 Dementia in other diseases classified elsewhere without behavioral disturbance: Secondary | ICD-10-CM | POA: Diagnosis not present

## 2021-09-14 DIAGNOSIS — I25119 Atherosclerotic heart disease of native coronary artery with unspecified angina pectoris: Secondary | ICD-10-CM | POA: Diagnosis not present

## 2021-09-14 DIAGNOSIS — J9611 Chronic respiratory failure with hypoxia: Secondary | ICD-10-CM | POA: Diagnosis not present

## 2021-09-14 DIAGNOSIS — R634 Abnormal weight loss: Secondary | ICD-10-CM | POA: Diagnosis not present

## 2021-09-14 DIAGNOSIS — I252 Old myocardial infarction: Secondary | ICD-10-CM | POA: Diagnosis not present

## 2021-09-15 DIAGNOSIS — G309 Alzheimer's disease, unspecified: Secondary | ICD-10-CM | POA: Diagnosis not present

## 2021-09-15 DIAGNOSIS — F0280 Dementia in other diseases classified elsewhere without behavioral disturbance: Secondary | ICD-10-CM | POA: Diagnosis not present

## 2021-09-15 DIAGNOSIS — I25119 Atherosclerotic heart disease of native coronary artery with unspecified angina pectoris: Secondary | ICD-10-CM | POA: Diagnosis not present

## 2021-09-15 DIAGNOSIS — R634 Abnormal weight loss: Secondary | ICD-10-CM | POA: Diagnosis not present

## 2021-09-15 DIAGNOSIS — J9611 Chronic respiratory failure with hypoxia: Secondary | ICD-10-CM | POA: Diagnosis not present

## 2021-09-15 DIAGNOSIS — I252 Old myocardial infarction: Secondary | ICD-10-CM | POA: Diagnosis not present

## 2021-09-17 DIAGNOSIS — F0280 Dementia in other diseases classified elsewhere without behavioral disturbance: Secondary | ICD-10-CM | POA: Diagnosis not present

## 2021-09-17 DIAGNOSIS — G309 Alzheimer's disease, unspecified: Secondary | ICD-10-CM | POA: Diagnosis not present

## 2021-09-17 DIAGNOSIS — R634 Abnormal weight loss: Secondary | ICD-10-CM | POA: Diagnosis not present

## 2021-09-17 DIAGNOSIS — J9611 Chronic respiratory failure with hypoxia: Secondary | ICD-10-CM | POA: Diagnosis not present

## 2021-09-17 DIAGNOSIS — I252 Old myocardial infarction: Secondary | ICD-10-CM | POA: Diagnosis not present

## 2021-09-17 DIAGNOSIS — I25119 Atherosclerotic heart disease of native coronary artery with unspecified angina pectoris: Secondary | ICD-10-CM | POA: Diagnosis not present

## 2021-09-21 DIAGNOSIS — F0280 Dementia in other diseases classified elsewhere without behavioral disturbance: Secondary | ICD-10-CM | POA: Diagnosis not present

## 2021-09-21 DIAGNOSIS — I252 Old myocardial infarction: Secondary | ICD-10-CM | POA: Diagnosis not present

## 2021-09-21 DIAGNOSIS — G309 Alzheimer's disease, unspecified: Secondary | ICD-10-CM | POA: Diagnosis not present

## 2021-09-21 DIAGNOSIS — I25119 Atherosclerotic heart disease of native coronary artery with unspecified angina pectoris: Secondary | ICD-10-CM | POA: Diagnosis not present

## 2021-09-21 DIAGNOSIS — J9611 Chronic respiratory failure with hypoxia: Secondary | ICD-10-CM | POA: Diagnosis not present

## 2021-09-21 DIAGNOSIS — R634 Abnormal weight loss: Secondary | ICD-10-CM | POA: Diagnosis not present

## 2021-09-22 DIAGNOSIS — R634 Abnormal weight loss: Secondary | ICD-10-CM | POA: Diagnosis not present

## 2021-09-22 DIAGNOSIS — G309 Alzheimer's disease, unspecified: Secondary | ICD-10-CM | POA: Diagnosis not present

## 2021-09-22 DIAGNOSIS — I252 Old myocardial infarction: Secondary | ICD-10-CM | POA: Diagnosis not present

## 2021-09-22 DIAGNOSIS — I25119 Atherosclerotic heart disease of native coronary artery with unspecified angina pectoris: Secondary | ICD-10-CM | POA: Diagnosis not present

## 2021-09-22 DIAGNOSIS — J9611 Chronic respiratory failure with hypoxia: Secondary | ICD-10-CM | POA: Diagnosis not present

## 2021-09-22 DIAGNOSIS — F0280 Dementia in other diseases classified elsewhere without behavioral disturbance: Secondary | ICD-10-CM | POA: Diagnosis not present

## 2021-09-24 DIAGNOSIS — G309 Alzheimer's disease, unspecified: Secondary | ICD-10-CM | POA: Diagnosis not present

## 2021-09-24 DIAGNOSIS — J9611 Chronic respiratory failure with hypoxia: Secondary | ICD-10-CM | POA: Diagnosis not present

## 2021-09-24 DIAGNOSIS — I252 Old myocardial infarction: Secondary | ICD-10-CM | POA: Diagnosis not present

## 2021-09-24 DIAGNOSIS — B351 Tinea unguium: Secondary | ICD-10-CM | POA: Diagnosis not present

## 2021-09-24 DIAGNOSIS — R634 Abnormal weight loss: Secondary | ICD-10-CM | POA: Diagnosis not present

## 2021-09-24 DIAGNOSIS — F0280 Dementia in other diseases classified elsewhere without behavioral disturbance: Secondary | ICD-10-CM | POA: Diagnosis not present

## 2021-09-24 DIAGNOSIS — I25119 Atherosclerotic heart disease of native coronary artery with unspecified angina pectoris: Secondary | ICD-10-CM | POA: Diagnosis not present

## 2021-09-24 DIAGNOSIS — I7091 Generalized atherosclerosis: Secondary | ICD-10-CM | POA: Diagnosis not present

## 2021-09-28 DIAGNOSIS — I25119 Atherosclerotic heart disease of native coronary artery with unspecified angina pectoris: Secondary | ICD-10-CM | POA: Diagnosis not present

## 2021-09-28 DIAGNOSIS — R634 Abnormal weight loss: Secondary | ICD-10-CM | POA: Diagnosis not present

## 2021-09-28 DIAGNOSIS — F0280 Dementia in other diseases classified elsewhere without behavioral disturbance: Secondary | ICD-10-CM | POA: Diagnosis not present

## 2021-09-28 DIAGNOSIS — J9611 Chronic respiratory failure with hypoxia: Secondary | ICD-10-CM | POA: Diagnosis not present

## 2021-09-28 DIAGNOSIS — G309 Alzheimer's disease, unspecified: Secondary | ICD-10-CM | POA: Diagnosis not present

## 2021-09-28 DIAGNOSIS — I252 Old myocardial infarction: Secondary | ICD-10-CM | POA: Diagnosis not present

## 2021-09-29 DIAGNOSIS — F0280 Dementia in other diseases classified elsewhere without behavioral disturbance: Secondary | ICD-10-CM | POA: Diagnosis not present

## 2021-09-29 DIAGNOSIS — I252 Old myocardial infarction: Secondary | ICD-10-CM | POA: Diagnosis not present

## 2021-09-29 DIAGNOSIS — I25119 Atherosclerotic heart disease of native coronary artery with unspecified angina pectoris: Secondary | ICD-10-CM | POA: Diagnosis not present

## 2021-09-29 DIAGNOSIS — R634 Abnormal weight loss: Secondary | ICD-10-CM | POA: Diagnosis not present

## 2021-09-29 DIAGNOSIS — G309 Alzheimer's disease, unspecified: Secondary | ICD-10-CM | POA: Diagnosis not present

## 2021-09-29 DIAGNOSIS — J9611 Chronic respiratory failure with hypoxia: Secondary | ICD-10-CM | POA: Diagnosis not present

## 2021-10-01 DIAGNOSIS — I252 Old myocardial infarction: Secondary | ICD-10-CM | POA: Diagnosis not present

## 2021-10-01 DIAGNOSIS — G309 Alzheimer's disease, unspecified: Secondary | ICD-10-CM | POA: Diagnosis not present

## 2021-10-01 DIAGNOSIS — R634 Abnormal weight loss: Secondary | ICD-10-CM | POA: Diagnosis not present

## 2021-10-01 DIAGNOSIS — F0280 Dementia in other diseases classified elsewhere without behavioral disturbance: Secondary | ICD-10-CM | POA: Diagnosis not present

## 2021-10-01 DIAGNOSIS — I25119 Atherosclerotic heart disease of native coronary artery with unspecified angina pectoris: Secondary | ICD-10-CM | POA: Diagnosis not present

## 2021-10-01 DIAGNOSIS — J9611 Chronic respiratory failure with hypoxia: Secondary | ICD-10-CM | POA: Diagnosis not present

## 2021-10-04 DIAGNOSIS — Z8673 Personal history of transient ischemic attack (TIA), and cerebral infarction without residual deficits: Secondary | ICD-10-CM | POA: Diagnosis not present

## 2021-10-04 DIAGNOSIS — R159 Full incontinence of feces: Secondary | ICD-10-CM | POA: Diagnosis not present

## 2021-10-04 DIAGNOSIS — R634 Abnormal weight loss: Secondary | ICD-10-CM | POA: Diagnosis not present

## 2021-10-04 DIAGNOSIS — F0280 Dementia in other diseases classified elsewhere without behavioral disturbance: Secondary | ICD-10-CM | POA: Diagnosis not present

## 2021-10-04 DIAGNOSIS — G309 Alzheimer's disease, unspecified: Secondary | ICD-10-CM | POA: Diagnosis not present

## 2021-10-04 DIAGNOSIS — I5030 Unspecified diastolic (congestive) heart failure: Secondary | ICD-10-CM | POA: Diagnosis not present

## 2021-10-04 DIAGNOSIS — G629 Polyneuropathy, unspecified: Secondary | ICD-10-CM | POA: Diagnosis not present

## 2021-10-04 DIAGNOSIS — Z741 Need for assistance with personal care: Secondary | ICD-10-CM | POA: Diagnosis not present

## 2021-10-04 DIAGNOSIS — I25119 Atherosclerotic heart disease of native coronary artery with unspecified angina pectoris: Secondary | ICD-10-CM | POA: Diagnosis not present

## 2021-10-04 DIAGNOSIS — I130 Hypertensive heart and chronic kidney disease with heart failure and stage 1 through stage 4 chronic kidney disease, or unspecified chronic kidney disease: Secondary | ICD-10-CM | POA: Diagnosis not present

## 2021-10-04 DIAGNOSIS — J449 Chronic obstructive pulmonary disease, unspecified: Secondary | ICD-10-CM | POA: Diagnosis not present

## 2021-10-04 DIAGNOSIS — E785 Hyperlipidemia, unspecified: Secondary | ICD-10-CM | POA: Diagnosis not present

## 2021-10-04 DIAGNOSIS — I252 Old myocardial infarction: Secondary | ICD-10-CM | POA: Diagnosis not present

## 2021-10-04 DIAGNOSIS — K219 Gastro-esophageal reflux disease without esophagitis: Secondary | ICD-10-CM | POA: Diagnosis not present

## 2021-10-04 DIAGNOSIS — R627 Adult failure to thrive: Secondary | ICD-10-CM | POA: Diagnosis not present

## 2021-10-04 DIAGNOSIS — R32 Unspecified urinary incontinence: Secondary | ICD-10-CM | POA: Diagnosis not present

## 2021-10-04 DIAGNOSIS — Z7401 Bed confinement status: Secondary | ICD-10-CM | POA: Diagnosis not present

## 2021-10-04 DIAGNOSIS — G4700 Insomnia, unspecified: Secondary | ICD-10-CM | POA: Diagnosis not present

## 2021-10-04 DIAGNOSIS — Z9981 Dependence on supplemental oxygen: Secondary | ICD-10-CM | POA: Diagnosis not present

## 2021-10-04 DIAGNOSIS — Z681 Body mass index (BMI) 19 or less, adult: Secondary | ICD-10-CM | POA: Diagnosis not present

## 2021-10-04 DIAGNOSIS — J9611 Chronic respiratory failure with hypoxia: Secondary | ICD-10-CM | POA: Diagnosis not present

## 2021-10-04 DIAGNOSIS — N184 Chronic kidney disease, stage 4 (severe): Secondary | ICD-10-CM | POA: Diagnosis not present

## 2021-10-04 DIAGNOSIS — R630 Anorexia: Secondary | ICD-10-CM | POA: Diagnosis not present

## 2021-10-04 DIAGNOSIS — D631 Anemia in chronic kidney disease: Secondary | ICD-10-CM | POA: Diagnosis not present

## 2021-10-04 DIAGNOSIS — E039 Hypothyroidism, unspecified: Secondary | ICD-10-CM | POA: Diagnosis not present

## 2021-10-05 DIAGNOSIS — G309 Alzheimer's disease, unspecified: Secondary | ICD-10-CM | POA: Diagnosis not present

## 2021-10-05 DIAGNOSIS — I25119 Atherosclerotic heart disease of native coronary artery with unspecified angina pectoris: Secondary | ICD-10-CM | POA: Diagnosis not present

## 2021-10-05 DIAGNOSIS — R634 Abnormal weight loss: Secondary | ICD-10-CM | POA: Diagnosis not present

## 2021-10-05 DIAGNOSIS — F0280 Dementia in other diseases classified elsewhere without behavioral disturbance: Secondary | ICD-10-CM | POA: Diagnosis not present

## 2021-10-05 DIAGNOSIS — I252 Old myocardial infarction: Secondary | ICD-10-CM | POA: Diagnosis not present

## 2021-10-05 DIAGNOSIS — J9611 Chronic respiratory failure with hypoxia: Secondary | ICD-10-CM | POA: Diagnosis not present

## 2021-10-06 DIAGNOSIS — I25119 Atherosclerotic heart disease of native coronary artery with unspecified angina pectoris: Secondary | ICD-10-CM | POA: Diagnosis not present

## 2021-10-06 DIAGNOSIS — F0280 Dementia in other diseases classified elsewhere without behavioral disturbance: Secondary | ICD-10-CM | POA: Diagnosis not present

## 2021-10-06 DIAGNOSIS — G309 Alzheimer's disease, unspecified: Secondary | ICD-10-CM | POA: Diagnosis not present

## 2021-10-06 DIAGNOSIS — I252 Old myocardial infarction: Secondary | ICD-10-CM | POA: Diagnosis not present

## 2021-10-06 DIAGNOSIS — R634 Abnormal weight loss: Secondary | ICD-10-CM | POA: Diagnosis not present

## 2021-10-06 DIAGNOSIS — J9611 Chronic respiratory failure with hypoxia: Secondary | ICD-10-CM | POA: Diagnosis not present

## 2021-10-08 DIAGNOSIS — R634 Abnormal weight loss: Secondary | ICD-10-CM | POA: Diagnosis not present

## 2021-10-08 DIAGNOSIS — I25119 Atherosclerotic heart disease of native coronary artery with unspecified angina pectoris: Secondary | ICD-10-CM | POA: Diagnosis not present

## 2021-10-08 DIAGNOSIS — G309 Alzheimer's disease, unspecified: Secondary | ICD-10-CM | POA: Diagnosis not present

## 2021-10-08 DIAGNOSIS — J9611 Chronic respiratory failure with hypoxia: Secondary | ICD-10-CM | POA: Diagnosis not present

## 2021-10-08 DIAGNOSIS — I252 Old myocardial infarction: Secondary | ICD-10-CM | POA: Diagnosis not present

## 2021-10-08 DIAGNOSIS — F0280 Dementia in other diseases classified elsewhere without behavioral disturbance: Secondary | ICD-10-CM | POA: Diagnosis not present

## 2021-10-12 DIAGNOSIS — I25119 Atherosclerotic heart disease of native coronary artery with unspecified angina pectoris: Secondary | ICD-10-CM | POA: Diagnosis not present

## 2021-10-12 DIAGNOSIS — F0280 Dementia in other diseases classified elsewhere without behavioral disturbance: Secondary | ICD-10-CM | POA: Diagnosis not present

## 2021-10-12 DIAGNOSIS — I252 Old myocardial infarction: Secondary | ICD-10-CM | POA: Diagnosis not present

## 2021-10-12 DIAGNOSIS — G309 Alzheimer's disease, unspecified: Secondary | ICD-10-CM | POA: Diagnosis not present

## 2021-10-12 DIAGNOSIS — J9611 Chronic respiratory failure with hypoxia: Secondary | ICD-10-CM | POA: Diagnosis not present

## 2021-10-12 DIAGNOSIS — R634 Abnormal weight loss: Secondary | ICD-10-CM | POA: Diagnosis not present

## 2021-10-13 DIAGNOSIS — R634 Abnormal weight loss: Secondary | ICD-10-CM | POA: Diagnosis not present

## 2021-10-13 DIAGNOSIS — I25119 Atherosclerotic heart disease of native coronary artery with unspecified angina pectoris: Secondary | ICD-10-CM | POA: Diagnosis not present

## 2021-10-13 DIAGNOSIS — J9611 Chronic respiratory failure with hypoxia: Secondary | ICD-10-CM | POA: Diagnosis not present

## 2021-10-13 DIAGNOSIS — F0280 Dementia in other diseases classified elsewhere without behavioral disturbance: Secondary | ICD-10-CM | POA: Diagnosis not present

## 2021-10-13 DIAGNOSIS — G309 Alzheimer's disease, unspecified: Secondary | ICD-10-CM | POA: Diagnosis not present

## 2021-10-13 DIAGNOSIS — I252 Old myocardial infarction: Secondary | ICD-10-CM | POA: Diagnosis not present

## 2021-10-14 DIAGNOSIS — I25119 Atherosclerotic heart disease of native coronary artery with unspecified angina pectoris: Secondary | ICD-10-CM | POA: Diagnosis not present

## 2021-10-14 DIAGNOSIS — J9611 Chronic respiratory failure with hypoxia: Secondary | ICD-10-CM | POA: Diagnosis not present

## 2021-10-14 DIAGNOSIS — F0280 Dementia in other diseases classified elsewhere without behavioral disturbance: Secondary | ICD-10-CM | POA: Diagnosis not present

## 2021-10-14 DIAGNOSIS — I252 Old myocardial infarction: Secondary | ICD-10-CM | POA: Diagnosis not present

## 2021-10-14 DIAGNOSIS — G309 Alzheimer's disease, unspecified: Secondary | ICD-10-CM | POA: Diagnosis not present

## 2021-10-14 DIAGNOSIS — R634 Abnormal weight loss: Secondary | ICD-10-CM | POA: Diagnosis not present

## 2021-10-15 ENCOUNTER — Encounter: Payer: Self-pay | Admitting: Nurse Practitioner

## 2021-10-15 ENCOUNTER — Non-Acute Institutional Stay (SKILLED_NURSING_FACILITY): Payer: Medicare Other | Admitting: Nurse Practitioner

## 2021-10-15 DIAGNOSIS — I2510 Atherosclerotic heart disease of native coronary artery without angina pectoris: Secondary | ICD-10-CM

## 2021-10-15 DIAGNOSIS — I10 Essential (primary) hypertension: Secondary | ICD-10-CM

## 2021-10-15 DIAGNOSIS — K219 Gastro-esophageal reflux disease without esophagitis: Secondary | ICD-10-CM

## 2021-10-15 DIAGNOSIS — N1832 Chronic kidney disease, stage 3b: Secondary | ICD-10-CM | POA: Diagnosis not present

## 2021-10-15 DIAGNOSIS — E039 Hypothyroidism, unspecified: Secondary | ICD-10-CM

## 2021-10-15 DIAGNOSIS — G309 Alzheimer's disease, unspecified: Secondary | ICD-10-CM

## 2021-10-15 DIAGNOSIS — I252 Old myocardial infarction: Secondary | ICD-10-CM | POA: Diagnosis not present

## 2021-10-15 DIAGNOSIS — I25119 Atherosclerotic heart disease of native coronary artery with unspecified angina pectoris: Secondary | ICD-10-CM | POA: Diagnosis not present

## 2021-10-15 DIAGNOSIS — E440 Moderate protein-calorie malnutrition: Secondary | ICD-10-CM

## 2021-10-15 DIAGNOSIS — F0280 Dementia in other diseases classified elsewhere without behavioral disturbance: Secondary | ICD-10-CM

## 2021-10-15 DIAGNOSIS — R634 Abnormal weight loss: Secondary | ICD-10-CM | POA: Diagnosis not present

## 2021-10-15 DIAGNOSIS — J9611 Chronic respiratory failure with hypoxia: Secondary | ICD-10-CM | POA: Diagnosis not present

## 2021-10-15 NOTE — Progress Notes (Unsigned)
Location:   [place] Room Number: 735 Place of Service:  SNF [id number]) Provider:  Sherrie Mustache, NP  Dewayne Shorter, MD  Patient Care Team: Dewayne Shorter, MD as PCP - General (Family Medicine) Wellington Hampshire, MD as PCP - Cardiology (Cardiology) Wellington Hampshire, MD (Cardiology) Mhoon, Larkin Ina, MD (Neurology) Birder Robson, MD (Ophthalmology)  Extended Emergency Contact Information Primary Emergency Contact: [facility] Address: [address]          [place], [place] [address] Johnnette Litter of [place] Phone: [phone] Mobile Phone: [phone] Relation: Daughter Secondary Emergency Contact: [facility] Address: [address]          [place], [place] [address] Johnnette Litter of [place] Phone: [phone] Mobile Phone: [phone] Relation: Daughter  Code Status:  DNR Goals of care: Advanced Directive information    10/15/2021    4:31 PM  Advanced Directives  Does Patient Have a Medical Advance Directive? Yes  Type of Paramedic of [place];Out of facility DNR (pink MOST or yellow form)  Does patient want to make changes to medical advance directive? No - Patient declined  Copy of [place] in Chart? Yes - validated most recent copy scanned in chart (See row information)     Chief Complaint  Patient presents with   Medical Management of Chronic Issues    Routine follow up   Immunizations    COVID vaccine,tetanus/tdap, pneumonia vaccine, shingrix vaccine, flu vaccine    HPI:  Pt is a 88 y.o. female seen today for medical management of chronic diseases.  Pt and nursing without any concerns today. She bed bound with hx of dementia, COPD, CAD, HTN. She is on hospice care due to advance dementia.    Past Medical History:  Diagnosis Date   Alzheimer disease ([place])    Anxiety    CAD (coronary artery disease)    h/o NSTEMI, LAD stent placement 10/04, cath 05/11/2010    Carotid artery occlusion    COPD (chronic obstructive pulmonary disease) ([place])    Depression    Diastolic dysfunction    Echo 05/11/2010, EF 65-70%   HLD (hyperlipidemia)    HTN (hypertension)    Hx of MELAS    MELAS CARRIER   Hypertension    Kidney infection    PMR (polymyalgia rheumatica) (HCC)    with h/o steroid induced myopathy   Shoulder fracture, right    Thyroid disease    TIA (transient ischemic attack)    11/05 s/p L CEA   Past Surgical History:  Procedure Laterality Date   CARDIAC CATHETERIZATION     unc   CARDIAC CATHETERIZATION     CARDIAC CATHETERIZATION     CAROTID ENDARTERECTOMY     left with stent placement 11/2003   CESAREAN SECTION     ILIAC ARTERY STENT     right, 2001   LUNG BIOPSY      Allergies  Allergen Reactions   Prednisone Other (See Comments)    Weight gain,puffy face   Shellfish Allergy Other (See Comments)    Unknown    Allergies as of 10/15/2021       Reactions   Prednisone Other (See Comments)   Weight gain,puffy face   Shellfish Allergy Other (See Comments)   Unknown        Medication List        Accurate as of October 15, 2021  4:33 PM. If you have any questions, ask your nurse or doctor.  STOP taking these medications    acetaminophen 650 MG CR tablet Commonly known as: TYLENOL Stopped by: Lauree Chandler, NP   Advair Diskus 250-50 MCG/ACT Aepb Generic drug: fluticasone-salmeterol Stopped by: Lauree Chandler, NP   albuterol (2.5 MG/3ML) 0.083% nebulizer solution Commonly known as: PROVENTIL Stopped by: Lauree Chandler, NP   atorvastatin 20 MG tablet Commonly known as: LIPITOR Stopped by: Lauree Chandler, NP   barrier cream Crea Commonly known as: non-specified Stopped by: Lauree Chandler, NP   bisacodyl 10 MG suppository Commonly known as: DULCOLAX Stopped by: Lauree Chandler, NP   bismuth subsalicylate 606 TK/16WF suspension Commonly known as: PEPTO BISMOL Stopped by: Lauree Chandler, NP   clopidogrel 75 MG tablet Commonly known as: PLAVIX Stopped by: Lauree Chandler, NP   ondansetron 4 MG disintegrating tablet Commonly known as: Zofran ODT Stopped by: Lauree Chandler, NP   Spiriva HandiHaler 18 MCG inhalation capsule Generic drug: tiotropium Stopped by: Lauree Chandler, NP       TAKE these medications    amLODipine 5 MG tablet Commonly known as: NORVASC Take 5 mg by mouth daily. What changed: Another medication with the same name was removed. Continue taking this medication, and follow the directions you see here. Changed by: Lauree Chandler, NP   cyanocobalamin 1000 MCG tablet Commonly known as: VITAMIN B12 Take 1,000 mcg by mouth daily.   Dextromethorphan-guaiFENesin 5-100 MG/5ML Liqd Take 10 mLs by mouth every 4 (four) hours as needed (cough).   hydrocortisone 2.5 % cream Apply 1 Application topically every 8 (eight) hours as needed.   ipratropium-albuterol 0.5-2.5 (3) MG/3ML Soln Commonly known as: DUONEB Take 3 mLs by nebulization every 4 (four) hours as needed (wheezing or dyspnea).   levothyroxine 25 MCG tablet Commonly known as: SYNTHROID Take 2 tablets (50 mcg total) by mouth at bedtime.   lisinopril 20 MG tablet Commonly known as: ZESTRIL Take 1 tablet (20 mg total) by mouth daily.   memantine 5 MG tablet Commonly known as: NAMENDA Take 10 mg by mouth 2 (two) times daily.   mirtazapine 30 MG tablet Commonly known as: REMERON Take 30 mg by mouth at bedtime.   MORPHINE SULFATE (CONCENTRATE) PO Take 0.25 mLs by mouth. Every hour as needed for pain   nitroGLYCERIN 0.4 MG SL tablet Commonly known as: NITROSTAT Place 0.4 mg under the tongue every 5 (five) minutes as needed for chest pain.   pantoprazole sodium 40 mg Commonly known as: PROTONIX Take 20 mg by mouth daily.   polyethylene glycol 17 g packet Commonly known as: MIRALAX / GLYCOLAX Take 17 g by mouth daily.   sertraline 100 MG tablet Commonly  known as: ZOLOFT Take 100 mg by mouth at bedtime.   triamcinolone cream 0.1 % Commonly known as: KENALOG Apply 1 application topically 2 (two) times daily as needed (rash).        Review of Systems  Unable to perform ROS: Dementia    Immunization History  Administered Date(s) Administered   Influenza Split 01/14/2011   Influenza,inj,Quad PF,6+ Mos 10/13/2015   Influenza-Unspecified 09/15/2012, 10/30/2014   Pneumococcal Conjugate-13 08/09/2016   Pneumococcal Polysaccharide-23 08/09/2017   Unspecified SARS-COV-2 Vaccination 01/14/2019, 02/11/2019, 11/14/2019, 05/23/2020, 09/26/2020   Pertinent  Health Maintenance Due  Topic Date Due   INFLUENZA VACCINE  08/04/2021   DEXA SCAN  Completed      06/18/2019    7:16 PM 06/19/2019   12:08 AM 06/19/2019    5:00 PM  06/19/2019    8:00 PM 06/20/2019    9:15 AM  Fall Risk  Patient Fall Risk Level High fall risk High fall risk Moderate fall risk High fall risk High fall risk   Functional Status Survey:    Vitals:   10/15/21 1558  BP: 127/73  Pulse: 76  Resp: 20  Temp: (!) 97.3 F (36.3 C)  SpO2: 94%  Weight: 91 lb 6.4 oz (41.5 kg)  Height: '5\' 4"'$  (1.626 m)   Body mass index is 15.69 kg/m. Physical Exam Constitutional:      General: She is not in acute distress.    Appearance: She is well-developed. She is not diaphoretic.  HENT:     Head: Normocephalic and atraumatic.     Mouth/Throat:     Pharynx: No oropharyngeal exudate.  Eyes:     Conjunctiva/sclera: Conjunctivae normal.     Pupils: Pupils are equal, round, and reactive to light.  Cardiovascular:     Rate and Rhythm: Normal rate and regular rhythm.     Heart sounds: Normal heart sounds.  Pulmonary:     Effort: Pulmonary effort is normal.     Breath sounds: Normal breath sounds.  Abdominal:     General: Bowel sounds are normal.     Palpations: Abdomen is soft.  Musculoskeletal:     Cervical back: Normal range of motion and neck supple.     Right lower leg:  No edema.     Left lower leg: No edema.  Skin:    General: Skin is warm and dry.  Neurological:     Mental Status: She is alert. Mental status is at baseline.  Psychiatric:        Mood and Affect: Mood normal.     Labs reviewed: No results for input(s): "NA", "K", "CL", "CO2", "GLUCOSE", "BUN", "CREATININE", "CALCIUM", "MG", "PHOS" in the last 8760 hours. No results for input(s): "AST", "ALT", "ALKPHOS", "BILITOT", "PROT", "ALBUMIN" in the last 8760 hours. No results for input(s): "WBC", "NEUTROABS", "HGB", "HCT", "MCV", "PLT" in the last 8760 hours. Lab Results  Component Value Date   TSH 4.588 (H) 06/19/2019   Lab Results  Component Value Date   HGBA1C 5.3 08/20/2012   Lab Results  Component Value Date   CHOL 110 06/19/2019   HDL 40 (L) 06/19/2019   LDLCALC 54 06/19/2019   LDLDIRECT 99.0 01/02/2015   TRIG 82 06/19/2019   CHOLHDL 2.8 06/19/2019    Significant Diagnostic Results in last 30 days:  No results found.  Assessment/Plan 1. Alzheimer's dementia without behavioral disturbance ([place]) -advanced, no acute changes in cognitive or functional status, continue supportive care.   2. Coronary artery disease involving native coronary artery of native heart without angina pectoris -stable, without chest pains.   3. Primary hypertension -Blood pressure well controlled at this time.   4. Hypothyroidism, unspecified type -continues on synthroid 50 mcg daily   5. Moderate protein-calorie malnutrition ([place]) Ongoing, continues on rememron for appetite, expect to worsen with progression of dementia.   6. Stage 3b chronic kidney disease (HCC) Chronic,  Encourage proper hydration Avoid nephrotoxic meds (NSAIDS)  7. Gastroesophageal reflux disease without esophagitis No reports of worsening GERD, continues on protonix daily    Valera Vallas K. [place], [facility] [phone]

## 2021-10-19 DIAGNOSIS — R634 Abnormal weight loss: Secondary | ICD-10-CM | POA: Diagnosis not present

## 2021-10-19 DIAGNOSIS — J9611 Chronic respiratory failure with hypoxia: Secondary | ICD-10-CM | POA: Diagnosis not present

## 2021-10-19 DIAGNOSIS — F0280 Dementia in other diseases classified elsewhere without behavioral disturbance: Secondary | ICD-10-CM | POA: Diagnosis not present

## 2021-10-19 DIAGNOSIS — I252 Old myocardial infarction: Secondary | ICD-10-CM | POA: Diagnosis not present

## 2021-10-19 DIAGNOSIS — G309 Alzheimer's disease, unspecified: Secondary | ICD-10-CM | POA: Diagnosis not present

## 2021-10-19 DIAGNOSIS — I25119 Atherosclerotic heart disease of native coronary artery with unspecified angina pectoris: Secondary | ICD-10-CM | POA: Diagnosis not present

## 2021-10-20 DIAGNOSIS — G309 Alzheimer's disease, unspecified: Secondary | ICD-10-CM | POA: Diagnosis not present

## 2021-10-20 DIAGNOSIS — J9611 Chronic respiratory failure with hypoxia: Secondary | ICD-10-CM | POA: Diagnosis not present

## 2021-10-20 DIAGNOSIS — I25119 Atherosclerotic heart disease of native coronary artery with unspecified angina pectoris: Secondary | ICD-10-CM | POA: Diagnosis not present

## 2021-10-20 DIAGNOSIS — R634 Abnormal weight loss: Secondary | ICD-10-CM | POA: Diagnosis not present

## 2021-10-20 DIAGNOSIS — I252 Old myocardial infarction: Secondary | ICD-10-CM | POA: Diagnosis not present

## 2021-10-20 DIAGNOSIS — F0280 Dementia in other diseases classified elsewhere without behavioral disturbance: Secondary | ICD-10-CM | POA: Diagnosis not present

## 2021-10-22 DIAGNOSIS — I252 Old myocardial infarction: Secondary | ICD-10-CM | POA: Diagnosis not present

## 2021-10-22 DIAGNOSIS — R634 Abnormal weight loss: Secondary | ICD-10-CM | POA: Diagnosis not present

## 2021-10-22 DIAGNOSIS — G309 Alzheimer's disease, unspecified: Secondary | ICD-10-CM | POA: Diagnosis not present

## 2021-10-22 DIAGNOSIS — I25119 Atherosclerotic heart disease of native coronary artery with unspecified angina pectoris: Secondary | ICD-10-CM | POA: Diagnosis not present

## 2021-10-22 DIAGNOSIS — F0280 Dementia in other diseases classified elsewhere without behavioral disturbance: Secondary | ICD-10-CM | POA: Diagnosis not present

## 2021-10-22 DIAGNOSIS — J9611 Chronic respiratory failure with hypoxia: Secondary | ICD-10-CM | POA: Diagnosis not present

## 2021-10-26 DIAGNOSIS — I252 Old myocardial infarction: Secondary | ICD-10-CM | POA: Diagnosis not present

## 2021-10-26 DIAGNOSIS — J9611 Chronic respiratory failure with hypoxia: Secondary | ICD-10-CM | POA: Diagnosis not present

## 2021-10-26 DIAGNOSIS — R634 Abnormal weight loss: Secondary | ICD-10-CM | POA: Diagnosis not present

## 2021-10-26 DIAGNOSIS — I25119 Atherosclerotic heart disease of native coronary artery with unspecified angina pectoris: Secondary | ICD-10-CM | POA: Diagnosis not present

## 2021-10-26 DIAGNOSIS — G309 Alzheimer's disease, unspecified: Secondary | ICD-10-CM | POA: Diagnosis not present

## 2021-10-26 DIAGNOSIS — F0280 Dementia in other diseases classified elsewhere without behavioral disturbance: Secondary | ICD-10-CM | POA: Diagnosis not present

## 2021-10-27 DIAGNOSIS — R634 Abnormal weight loss: Secondary | ICD-10-CM | POA: Diagnosis not present

## 2021-10-27 DIAGNOSIS — F0280 Dementia in other diseases classified elsewhere without behavioral disturbance: Secondary | ICD-10-CM | POA: Diagnosis not present

## 2021-10-27 DIAGNOSIS — I25119 Atherosclerotic heart disease of native coronary artery with unspecified angina pectoris: Secondary | ICD-10-CM | POA: Diagnosis not present

## 2021-10-27 DIAGNOSIS — J9611 Chronic respiratory failure with hypoxia: Secondary | ICD-10-CM | POA: Diagnosis not present

## 2021-10-27 DIAGNOSIS — I252 Old myocardial infarction: Secondary | ICD-10-CM | POA: Diagnosis not present

## 2021-10-27 DIAGNOSIS — G309 Alzheimer's disease, unspecified: Secondary | ICD-10-CM | POA: Diagnosis not present

## 2021-10-29 DIAGNOSIS — I25119 Atherosclerotic heart disease of native coronary artery with unspecified angina pectoris: Secondary | ICD-10-CM | POA: Diagnosis not present

## 2021-10-29 DIAGNOSIS — G309 Alzheimer's disease, unspecified: Secondary | ICD-10-CM | POA: Diagnosis not present

## 2021-10-29 DIAGNOSIS — J9611 Chronic respiratory failure with hypoxia: Secondary | ICD-10-CM | POA: Diagnosis not present

## 2021-10-29 DIAGNOSIS — I252 Old myocardial infarction: Secondary | ICD-10-CM | POA: Diagnosis not present

## 2021-10-29 DIAGNOSIS — F0280 Dementia in other diseases classified elsewhere without behavioral disturbance: Secondary | ICD-10-CM | POA: Diagnosis not present

## 2021-10-29 DIAGNOSIS — R634 Abnormal weight loss: Secondary | ICD-10-CM | POA: Diagnosis not present

## 2021-11-02 DIAGNOSIS — G309 Alzheimer's disease, unspecified: Secondary | ICD-10-CM | POA: Diagnosis not present

## 2021-11-02 DIAGNOSIS — I252 Old myocardial infarction: Secondary | ICD-10-CM | POA: Diagnosis not present

## 2021-11-02 DIAGNOSIS — F0280 Dementia in other diseases classified elsewhere without behavioral disturbance: Secondary | ICD-10-CM | POA: Diagnosis not present

## 2021-11-02 DIAGNOSIS — J9611 Chronic respiratory failure with hypoxia: Secondary | ICD-10-CM | POA: Diagnosis not present

## 2021-11-02 DIAGNOSIS — I25119 Atherosclerotic heart disease of native coronary artery with unspecified angina pectoris: Secondary | ICD-10-CM | POA: Diagnosis not present

## 2021-11-02 DIAGNOSIS — R634 Abnormal weight loss: Secondary | ICD-10-CM | POA: Diagnosis not present

## 2021-11-03 DIAGNOSIS — I252 Old myocardial infarction: Secondary | ICD-10-CM | POA: Diagnosis not present

## 2021-11-03 DIAGNOSIS — G309 Alzheimer's disease, unspecified: Secondary | ICD-10-CM | POA: Diagnosis not present

## 2021-11-03 DIAGNOSIS — F0280 Dementia in other diseases classified elsewhere without behavioral disturbance: Secondary | ICD-10-CM | POA: Diagnosis not present

## 2021-11-03 DIAGNOSIS — J9611 Chronic respiratory failure with hypoxia: Secondary | ICD-10-CM | POA: Diagnosis not present

## 2021-11-03 DIAGNOSIS — I25119 Atherosclerotic heart disease of native coronary artery with unspecified angina pectoris: Secondary | ICD-10-CM | POA: Diagnosis not present

## 2021-11-03 DIAGNOSIS — R634 Abnormal weight loss: Secondary | ICD-10-CM | POA: Diagnosis not present

## 2021-11-04 DIAGNOSIS — Z7401 Bed confinement status: Secondary | ICD-10-CM | POA: Diagnosis not present

## 2021-11-04 DIAGNOSIS — R630 Anorexia: Secondary | ICD-10-CM | POA: Diagnosis not present

## 2021-11-04 DIAGNOSIS — E785 Hyperlipidemia, unspecified: Secondary | ICD-10-CM | POA: Diagnosis not present

## 2021-11-04 DIAGNOSIS — J9611 Chronic respiratory failure with hypoxia: Secondary | ICD-10-CM | POA: Diagnosis not present

## 2021-11-04 DIAGNOSIS — J449 Chronic obstructive pulmonary disease, unspecified: Secondary | ICD-10-CM | POA: Diagnosis not present

## 2021-11-04 DIAGNOSIS — I25119 Atherosclerotic heart disease of native coronary artery with unspecified angina pectoris: Secondary | ICD-10-CM | POA: Diagnosis not present

## 2021-11-04 DIAGNOSIS — N184 Chronic kidney disease, stage 4 (severe): Secondary | ICD-10-CM | POA: Diagnosis not present

## 2021-11-04 DIAGNOSIS — Z741 Need for assistance with personal care: Secondary | ICD-10-CM | POA: Diagnosis not present

## 2021-11-04 DIAGNOSIS — M21372 Foot drop, left foot: Secondary | ICD-10-CM | POA: Diagnosis not present

## 2021-11-04 DIAGNOSIS — R627 Adult failure to thrive: Secondary | ICD-10-CM | POA: Diagnosis not present

## 2021-11-04 DIAGNOSIS — M21371 Foot drop, right foot: Secondary | ICD-10-CM | POA: Diagnosis not present

## 2021-11-04 DIAGNOSIS — I5030 Unspecified diastolic (congestive) heart failure: Secondary | ICD-10-CM | POA: Diagnosis not present

## 2021-11-04 DIAGNOSIS — F0280 Dementia in other diseases classified elsewhere without behavioral disturbance: Secondary | ICD-10-CM | POA: Diagnosis not present

## 2021-11-04 DIAGNOSIS — I130 Hypertensive heart and chronic kidney disease with heart failure and stage 1 through stage 4 chronic kidney disease, or unspecified chronic kidney disease: Secondary | ICD-10-CM | POA: Diagnosis not present

## 2021-11-04 DIAGNOSIS — G309 Alzheimer's disease, unspecified: Secondary | ICD-10-CM | POA: Diagnosis not present

## 2021-11-04 DIAGNOSIS — G629 Polyneuropathy, unspecified: Secondary | ICD-10-CM | POA: Diagnosis not present

## 2021-11-04 DIAGNOSIS — R634 Abnormal weight loss: Secondary | ICD-10-CM | POA: Diagnosis not present

## 2021-11-04 DIAGNOSIS — G4700 Insomnia, unspecified: Secondary | ICD-10-CM | POA: Diagnosis not present

## 2021-11-04 DIAGNOSIS — Z8673 Personal history of transient ischemic attack (TIA), and cerebral infarction without residual deficits: Secondary | ICD-10-CM | POA: Diagnosis not present

## 2021-11-04 DIAGNOSIS — R32 Unspecified urinary incontinence: Secondary | ICD-10-CM | POA: Diagnosis not present

## 2021-11-04 DIAGNOSIS — R159 Full incontinence of feces: Secondary | ICD-10-CM | POA: Diagnosis not present

## 2021-11-04 DIAGNOSIS — D631 Anemia in chronic kidney disease: Secondary | ICD-10-CM | POA: Diagnosis not present

## 2021-11-04 DIAGNOSIS — I252 Old myocardial infarction: Secondary | ICD-10-CM | POA: Diagnosis not present

## 2021-11-04 DIAGNOSIS — Z9981 Dependence on supplemental oxygen: Secondary | ICD-10-CM | POA: Diagnosis not present

## 2021-11-04 DIAGNOSIS — K219 Gastro-esophageal reflux disease without esophagitis: Secondary | ICD-10-CM | POA: Diagnosis not present

## 2021-11-05 DIAGNOSIS — F0280 Dementia in other diseases classified elsewhere without behavioral disturbance: Secondary | ICD-10-CM | POA: Diagnosis not present

## 2021-11-05 DIAGNOSIS — G309 Alzheimer's disease, unspecified: Secondary | ICD-10-CM | POA: Diagnosis not present

## 2021-11-05 DIAGNOSIS — J9611 Chronic respiratory failure with hypoxia: Secondary | ICD-10-CM | POA: Diagnosis not present

## 2021-11-05 DIAGNOSIS — R634 Abnormal weight loss: Secondary | ICD-10-CM | POA: Diagnosis not present

## 2021-11-05 DIAGNOSIS — I25119 Atherosclerotic heart disease of native coronary artery with unspecified angina pectoris: Secondary | ICD-10-CM | POA: Diagnosis not present

## 2021-11-05 DIAGNOSIS — I252 Old myocardial infarction: Secondary | ICD-10-CM | POA: Diagnosis not present

## 2021-11-06 DIAGNOSIS — G309 Alzheimer's disease, unspecified: Secondary | ICD-10-CM | POA: Diagnosis not present

## 2021-11-06 DIAGNOSIS — R634 Abnormal weight loss: Secondary | ICD-10-CM | POA: Diagnosis not present

## 2021-11-06 DIAGNOSIS — F0280 Dementia in other diseases classified elsewhere without behavioral disturbance: Secondary | ICD-10-CM | POA: Diagnosis not present

## 2021-11-06 DIAGNOSIS — I25119 Atherosclerotic heart disease of native coronary artery with unspecified angina pectoris: Secondary | ICD-10-CM | POA: Diagnosis not present

## 2021-11-06 DIAGNOSIS — J9611 Chronic respiratory failure with hypoxia: Secondary | ICD-10-CM | POA: Diagnosis not present

## 2021-11-06 DIAGNOSIS — I252 Old myocardial infarction: Secondary | ICD-10-CM | POA: Diagnosis not present

## 2021-11-09 DIAGNOSIS — J9611 Chronic respiratory failure with hypoxia: Secondary | ICD-10-CM | POA: Diagnosis not present

## 2021-11-09 DIAGNOSIS — R634 Abnormal weight loss: Secondary | ICD-10-CM | POA: Diagnosis not present

## 2021-11-09 DIAGNOSIS — F0280 Dementia in other diseases classified elsewhere without behavioral disturbance: Secondary | ICD-10-CM | POA: Diagnosis not present

## 2021-11-09 DIAGNOSIS — G309 Alzheimer's disease, unspecified: Secondary | ICD-10-CM | POA: Diagnosis not present

## 2021-11-09 DIAGNOSIS — I252 Old myocardial infarction: Secondary | ICD-10-CM | POA: Diagnosis not present

## 2021-11-09 DIAGNOSIS — I25119 Atherosclerotic heart disease of native coronary artery with unspecified angina pectoris: Secondary | ICD-10-CM | POA: Diagnosis not present

## 2021-11-10 DIAGNOSIS — F0280 Dementia in other diseases classified elsewhere without behavioral disturbance: Secondary | ICD-10-CM | POA: Diagnosis not present

## 2021-11-10 DIAGNOSIS — R634 Abnormal weight loss: Secondary | ICD-10-CM | POA: Diagnosis not present

## 2021-11-10 DIAGNOSIS — J9611 Chronic respiratory failure with hypoxia: Secondary | ICD-10-CM | POA: Diagnosis not present

## 2021-11-10 DIAGNOSIS — I25119 Atherosclerotic heart disease of native coronary artery with unspecified angina pectoris: Secondary | ICD-10-CM | POA: Diagnosis not present

## 2021-11-10 DIAGNOSIS — I252 Old myocardial infarction: Secondary | ICD-10-CM | POA: Diagnosis not present

## 2021-11-10 DIAGNOSIS — G309 Alzheimer's disease, unspecified: Secondary | ICD-10-CM | POA: Diagnosis not present

## 2021-11-12 DIAGNOSIS — F0280 Dementia in other diseases classified elsewhere without behavioral disturbance: Secondary | ICD-10-CM | POA: Diagnosis not present

## 2021-11-12 DIAGNOSIS — J9611 Chronic respiratory failure with hypoxia: Secondary | ICD-10-CM | POA: Diagnosis not present

## 2021-11-12 DIAGNOSIS — I252 Old myocardial infarction: Secondary | ICD-10-CM | POA: Diagnosis not present

## 2021-11-12 DIAGNOSIS — R634 Abnormal weight loss: Secondary | ICD-10-CM | POA: Diagnosis not present

## 2021-11-12 DIAGNOSIS — I25119 Atherosclerotic heart disease of native coronary artery with unspecified angina pectoris: Secondary | ICD-10-CM | POA: Diagnosis not present

## 2021-11-12 DIAGNOSIS — G309 Alzheimer's disease, unspecified: Secondary | ICD-10-CM | POA: Diagnosis not present

## 2021-11-13 DIAGNOSIS — Z23 Encounter for immunization: Secondary | ICD-10-CM | POA: Diagnosis not present

## 2021-11-16 DIAGNOSIS — G309 Alzheimer's disease, unspecified: Secondary | ICD-10-CM | POA: Diagnosis not present

## 2021-11-16 DIAGNOSIS — I252 Old myocardial infarction: Secondary | ICD-10-CM | POA: Diagnosis not present

## 2021-11-16 DIAGNOSIS — J9611 Chronic respiratory failure with hypoxia: Secondary | ICD-10-CM | POA: Diagnosis not present

## 2021-11-16 DIAGNOSIS — R634 Abnormal weight loss: Secondary | ICD-10-CM | POA: Diagnosis not present

## 2021-11-16 DIAGNOSIS — F0280 Dementia in other diseases classified elsewhere without behavioral disturbance: Secondary | ICD-10-CM | POA: Diagnosis not present

## 2021-11-16 DIAGNOSIS — I25119 Atherosclerotic heart disease of native coronary artery with unspecified angina pectoris: Secondary | ICD-10-CM | POA: Diagnosis not present

## 2021-11-17 DIAGNOSIS — R634 Abnormal weight loss: Secondary | ICD-10-CM | POA: Diagnosis not present

## 2021-11-17 DIAGNOSIS — F0280 Dementia in other diseases classified elsewhere without behavioral disturbance: Secondary | ICD-10-CM | POA: Diagnosis not present

## 2021-11-17 DIAGNOSIS — I25119 Atherosclerotic heart disease of native coronary artery with unspecified angina pectoris: Secondary | ICD-10-CM | POA: Diagnosis not present

## 2021-11-17 DIAGNOSIS — G309 Alzheimer's disease, unspecified: Secondary | ICD-10-CM | POA: Diagnosis not present

## 2021-11-17 DIAGNOSIS — J9611 Chronic respiratory failure with hypoxia: Secondary | ICD-10-CM | POA: Diagnosis not present

## 2021-11-17 DIAGNOSIS — I252 Old myocardial infarction: Secondary | ICD-10-CM | POA: Diagnosis not present

## 2021-11-19 DIAGNOSIS — F0280 Dementia in other diseases classified elsewhere without behavioral disturbance: Secondary | ICD-10-CM | POA: Diagnosis not present

## 2021-11-19 DIAGNOSIS — J9611 Chronic respiratory failure with hypoxia: Secondary | ICD-10-CM | POA: Diagnosis not present

## 2021-11-19 DIAGNOSIS — I252 Old myocardial infarction: Secondary | ICD-10-CM | POA: Diagnosis not present

## 2021-11-19 DIAGNOSIS — I25119 Atherosclerotic heart disease of native coronary artery with unspecified angina pectoris: Secondary | ICD-10-CM | POA: Diagnosis not present

## 2021-11-19 DIAGNOSIS — R634 Abnormal weight loss: Secondary | ICD-10-CM | POA: Diagnosis not present

## 2021-11-19 DIAGNOSIS — G309 Alzheimer's disease, unspecified: Secondary | ICD-10-CM | POA: Diagnosis not present

## 2021-11-23 DIAGNOSIS — I25119 Atherosclerotic heart disease of native coronary artery with unspecified angina pectoris: Secondary | ICD-10-CM | POA: Diagnosis not present

## 2021-11-23 DIAGNOSIS — R634 Abnormal weight loss: Secondary | ICD-10-CM | POA: Diagnosis not present

## 2021-11-23 DIAGNOSIS — F0280 Dementia in other diseases classified elsewhere without behavioral disturbance: Secondary | ICD-10-CM | POA: Diagnosis not present

## 2021-11-23 DIAGNOSIS — G309 Alzheimer's disease, unspecified: Secondary | ICD-10-CM | POA: Diagnosis not present

## 2021-11-23 DIAGNOSIS — I252 Old myocardial infarction: Secondary | ICD-10-CM | POA: Diagnosis not present

## 2021-11-23 DIAGNOSIS — J9611 Chronic respiratory failure with hypoxia: Secondary | ICD-10-CM | POA: Diagnosis not present

## 2021-11-24 DIAGNOSIS — I7091 Generalized atherosclerosis: Secondary | ICD-10-CM | POA: Diagnosis not present

## 2021-11-24 DIAGNOSIS — F0280 Dementia in other diseases classified elsewhere without behavioral disturbance: Secondary | ICD-10-CM | POA: Diagnosis not present

## 2021-11-24 DIAGNOSIS — I252 Old myocardial infarction: Secondary | ICD-10-CM | POA: Diagnosis not present

## 2021-11-24 DIAGNOSIS — G309 Alzheimer's disease, unspecified: Secondary | ICD-10-CM | POA: Diagnosis not present

## 2021-11-24 DIAGNOSIS — J9611 Chronic respiratory failure with hypoxia: Secondary | ICD-10-CM | POA: Diagnosis not present

## 2021-11-24 DIAGNOSIS — I25119 Atherosclerotic heart disease of native coronary artery with unspecified angina pectoris: Secondary | ICD-10-CM | POA: Diagnosis not present

## 2021-11-24 DIAGNOSIS — R634 Abnormal weight loss: Secondary | ICD-10-CM | POA: Diagnosis not present

## 2021-11-24 DIAGNOSIS — B351 Tinea unguium: Secondary | ICD-10-CM | POA: Diagnosis not present

## 2021-11-26 DIAGNOSIS — J9611 Chronic respiratory failure with hypoxia: Secondary | ICD-10-CM | POA: Diagnosis not present

## 2021-11-26 DIAGNOSIS — G309 Alzheimer's disease, unspecified: Secondary | ICD-10-CM | POA: Diagnosis not present

## 2021-11-26 DIAGNOSIS — I252 Old myocardial infarction: Secondary | ICD-10-CM | POA: Diagnosis not present

## 2021-11-26 DIAGNOSIS — I25119 Atherosclerotic heart disease of native coronary artery with unspecified angina pectoris: Secondary | ICD-10-CM | POA: Diagnosis not present

## 2021-11-26 DIAGNOSIS — R634 Abnormal weight loss: Secondary | ICD-10-CM | POA: Diagnosis not present

## 2021-11-26 DIAGNOSIS — F0280 Dementia in other diseases classified elsewhere without behavioral disturbance: Secondary | ICD-10-CM | POA: Diagnosis not present

## 2021-11-30 DIAGNOSIS — F0280 Dementia in other diseases classified elsewhere without behavioral disturbance: Secondary | ICD-10-CM | POA: Diagnosis not present

## 2021-11-30 DIAGNOSIS — R634 Abnormal weight loss: Secondary | ICD-10-CM | POA: Diagnosis not present

## 2021-11-30 DIAGNOSIS — I25119 Atherosclerotic heart disease of native coronary artery with unspecified angina pectoris: Secondary | ICD-10-CM | POA: Diagnosis not present

## 2021-11-30 DIAGNOSIS — I252 Old myocardial infarction: Secondary | ICD-10-CM | POA: Diagnosis not present

## 2021-11-30 DIAGNOSIS — G309 Alzheimer's disease, unspecified: Secondary | ICD-10-CM | POA: Diagnosis not present

## 2021-11-30 DIAGNOSIS — J9611 Chronic respiratory failure with hypoxia: Secondary | ICD-10-CM | POA: Diagnosis not present

## 2021-12-01 DIAGNOSIS — G309 Alzheimer's disease, unspecified: Secondary | ICD-10-CM | POA: Diagnosis not present

## 2021-12-01 DIAGNOSIS — R634 Abnormal weight loss: Secondary | ICD-10-CM | POA: Diagnosis not present

## 2021-12-01 DIAGNOSIS — J9611 Chronic respiratory failure with hypoxia: Secondary | ICD-10-CM | POA: Diagnosis not present

## 2021-12-01 DIAGNOSIS — I252 Old myocardial infarction: Secondary | ICD-10-CM | POA: Diagnosis not present

## 2021-12-01 DIAGNOSIS — I25119 Atherosclerotic heart disease of native coronary artery with unspecified angina pectoris: Secondary | ICD-10-CM | POA: Diagnosis not present

## 2021-12-01 DIAGNOSIS — F0280 Dementia in other diseases classified elsewhere without behavioral disturbance: Secondary | ICD-10-CM | POA: Diagnosis not present

## 2021-12-02 DIAGNOSIS — G309 Alzheimer's disease, unspecified: Secondary | ICD-10-CM | POA: Diagnosis not present

## 2021-12-02 DIAGNOSIS — I25119 Atherosclerotic heart disease of native coronary artery with unspecified angina pectoris: Secondary | ICD-10-CM | POA: Diagnosis not present

## 2021-12-02 DIAGNOSIS — J9611 Chronic respiratory failure with hypoxia: Secondary | ICD-10-CM | POA: Diagnosis not present

## 2021-12-02 DIAGNOSIS — I252 Old myocardial infarction: Secondary | ICD-10-CM | POA: Diagnosis not present

## 2021-12-02 DIAGNOSIS — F0280 Dementia in other diseases classified elsewhere without behavioral disturbance: Secondary | ICD-10-CM | POA: Diagnosis not present

## 2021-12-02 DIAGNOSIS — R634 Abnormal weight loss: Secondary | ICD-10-CM | POA: Diagnosis not present

## 2021-12-03 DIAGNOSIS — J9611 Chronic respiratory failure with hypoxia: Secondary | ICD-10-CM | POA: Diagnosis not present

## 2021-12-03 DIAGNOSIS — R634 Abnormal weight loss: Secondary | ICD-10-CM | POA: Diagnosis not present

## 2021-12-03 DIAGNOSIS — F0280 Dementia in other diseases classified elsewhere without behavioral disturbance: Secondary | ICD-10-CM | POA: Diagnosis not present

## 2021-12-03 DIAGNOSIS — G309 Alzheimer's disease, unspecified: Secondary | ICD-10-CM | POA: Diagnosis not present

## 2021-12-03 DIAGNOSIS — I25119 Atherosclerotic heart disease of native coronary artery with unspecified angina pectoris: Secondary | ICD-10-CM | POA: Diagnosis not present

## 2021-12-03 DIAGNOSIS — I252 Old myocardial infarction: Secondary | ICD-10-CM | POA: Diagnosis not present

## 2021-12-04 DIAGNOSIS — M21371 Foot drop, right foot: Secondary | ICD-10-CM | POA: Diagnosis not present

## 2021-12-04 DIAGNOSIS — M21372 Foot drop, left foot: Secondary | ICD-10-CM | POA: Diagnosis not present

## 2021-12-04 DIAGNOSIS — G309 Alzheimer's disease, unspecified: Secondary | ICD-10-CM | POA: Diagnosis not present

## 2021-12-04 DIAGNOSIS — G629 Polyneuropathy, unspecified: Secondary | ICD-10-CM | POA: Diagnosis not present

## 2021-12-04 DIAGNOSIS — R32 Unspecified urinary incontinence: Secondary | ICD-10-CM | POA: Diagnosis not present

## 2021-12-04 DIAGNOSIS — I25119 Atherosclerotic heart disease of native coronary artery with unspecified angina pectoris: Secondary | ICD-10-CM | POA: Diagnosis not present

## 2021-12-04 DIAGNOSIS — F0280 Dementia in other diseases classified elsewhere without behavioral disturbance: Secondary | ICD-10-CM | POA: Diagnosis not present

## 2021-12-04 DIAGNOSIS — I5030 Unspecified diastolic (congestive) heart failure: Secondary | ICD-10-CM | POA: Diagnosis not present

## 2021-12-04 DIAGNOSIS — D631 Anemia in chronic kidney disease: Secondary | ICD-10-CM | POA: Diagnosis not present

## 2021-12-04 DIAGNOSIS — J449 Chronic obstructive pulmonary disease, unspecified: Secondary | ICD-10-CM | POA: Diagnosis not present

## 2021-12-04 DIAGNOSIS — K219 Gastro-esophageal reflux disease without esophagitis: Secondary | ICD-10-CM | POA: Diagnosis not present

## 2021-12-04 DIAGNOSIS — I252 Old myocardial infarction: Secondary | ICD-10-CM | POA: Diagnosis not present

## 2021-12-04 DIAGNOSIS — Z7401 Bed confinement status: Secondary | ICD-10-CM | POA: Diagnosis not present

## 2021-12-04 DIAGNOSIS — E785 Hyperlipidemia, unspecified: Secondary | ICD-10-CM | POA: Diagnosis not present

## 2021-12-04 DIAGNOSIS — I130 Hypertensive heart and chronic kidney disease with heart failure and stage 1 through stage 4 chronic kidney disease, or unspecified chronic kidney disease: Secondary | ICD-10-CM | POA: Diagnosis not present

## 2021-12-04 DIAGNOSIS — R634 Abnormal weight loss: Secondary | ICD-10-CM | POA: Diagnosis not present

## 2021-12-04 DIAGNOSIS — R627 Adult failure to thrive: Secondary | ICD-10-CM | POA: Diagnosis not present

## 2021-12-04 DIAGNOSIS — Z9981 Dependence on supplemental oxygen: Secondary | ICD-10-CM | POA: Diagnosis not present

## 2021-12-04 DIAGNOSIS — J9611 Chronic respiratory failure with hypoxia: Secondary | ICD-10-CM | POA: Diagnosis not present

## 2021-12-04 DIAGNOSIS — Z741 Need for assistance with personal care: Secondary | ICD-10-CM | POA: Diagnosis not present

## 2021-12-04 DIAGNOSIS — R159 Full incontinence of feces: Secondary | ICD-10-CM | POA: Diagnosis not present

## 2021-12-04 DIAGNOSIS — R630 Anorexia: Secondary | ICD-10-CM | POA: Diagnosis not present

## 2021-12-04 DIAGNOSIS — Z8673 Personal history of transient ischemic attack (TIA), and cerebral infarction without residual deficits: Secondary | ICD-10-CM | POA: Diagnosis not present

## 2021-12-04 DIAGNOSIS — N184 Chronic kidney disease, stage 4 (severe): Secondary | ICD-10-CM | POA: Diagnosis not present

## 2021-12-04 DIAGNOSIS — G4700 Insomnia, unspecified: Secondary | ICD-10-CM | POA: Diagnosis not present

## 2021-12-05 DIAGNOSIS — J9611 Chronic respiratory failure with hypoxia: Secondary | ICD-10-CM | POA: Diagnosis not present

## 2021-12-05 DIAGNOSIS — I252 Old myocardial infarction: Secondary | ICD-10-CM | POA: Diagnosis not present

## 2021-12-05 DIAGNOSIS — I25119 Atherosclerotic heart disease of native coronary artery with unspecified angina pectoris: Secondary | ICD-10-CM | POA: Diagnosis not present

## 2021-12-05 DIAGNOSIS — R634 Abnormal weight loss: Secondary | ICD-10-CM | POA: Diagnosis not present

## 2021-12-05 DIAGNOSIS — G309 Alzheimer's disease, unspecified: Secondary | ICD-10-CM | POA: Diagnosis not present

## 2021-12-05 DIAGNOSIS — F0280 Dementia in other diseases classified elsewhere without behavioral disturbance: Secondary | ICD-10-CM | POA: Diagnosis not present

## 2022-01-04 DEATH — deceased
